# Patient Record
Sex: Female | Born: 1950
Health system: Southern US, Community
[De-identification: ages and names within clinical notes are randomized; demographics above are authoritative.]

## PROBLEM LIST (undated history)

## (undated) DIAGNOSIS — N390 Urinary tract infection, site not specified: Secondary | ICD-10-CM

## (undated) DIAGNOSIS — I10 Essential (primary) hypertension: Secondary | ICD-10-CM

## (undated) DIAGNOSIS — C50919 Malignant neoplasm of unspecified site of unspecified female breast: Secondary | ICD-10-CM

## (undated) DIAGNOSIS — E785 Hyperlipidemia, unspecified: Secondary | ICD-10-CM

## (undated) DIAGNOSIS — N189 Chronic kidney disease, unspecified: Secondary | ICD-10-CM

## (undated) DIAGNOSIS — E119 Type 2 diabetes mellitus without complications: Secondary | ICD-10-CM

## (undated) HISTORY — DX: Hyperlipidemia, unspecified: E78.5

---

## 2000-10-04 ENCOUNTER — Encounter: Payer: Self-pay | Admitting: General Surgery

## 2000-10-09 ENCOUNTER — Encounter (INDEPENDENT_AMBULATORY_CARE_PROVIDER_SITE_OTHER): Payer: Self-pay

## 2000-10-10 ENCOUNTER — Inpatient Hospital Stay (HOSPITAL_COMMUNITY): Admission: EM | Admit: 2000-10-10 | Discharge: 2000-10-12 | Payer: Self-pay | Admitting: General Surgery

## 2002-03-28 ENCOUNTER — Encounter: Admission: RE | Admit: 2002-03-28 | Discharge: 2002-06-26 | Payer: Self-pay | Admitting: Internal Medicine

## 2002-08-22 ENCOUNTER — Encounter: Admission: RE | Admit: 2002-08-22 | Discharge: 2002-08-22 | Payer: Self-pay | Admitting: Internal Medicine

## 2002-08-22 ENCOUNTER — Encounter: Payer: Self-pay | Admitting: Internal Medicine

## 2004-01-02 ENCOUNTER — Ambulatory Visit (HOSPITAL_COMMUNITY): Admission: RE | Admit: 2004-01-02 | Discharge: 2004-01-02 | Payer: Self-pay | Admitting: Gastroenterology

## 2006-03-21 ENCOUNTER — Encounter: Admission: RE | Admit: 2006-03-21 | Discharge: 2006-03-21 | Payer: Self-pay | Admitting: Internal Medicine

## 2009-07-25 ENCOUNTER — Emergency Department (HOSPITAL_COMMUNITY): Admission: EM | Admit: 2009-07-25 | Discharge: 2009-07-25 | Payer: Self-pay | Admitting: Emergency Medicine

## 2009-08-23 ENCOUNTER — Emergency Department (HOSPITAL_COMMUNITY): Admission: EM | Admit: 2009-08-23 | Discharge: 2009-08-23 | Payer: Self-pay | Admitting: Emergency Medicine

## 2009-09-16 ENCOUNTER — Inpatient Hospital Stay (HOSPITAL_COMMUNITY): Admission: EM | Admit: 2009-09-16 | Discharge: 2009-09-18 | Payer: Self-pay | Admitting: Emergency Medicine

## 2009-09-17 ENCOUNTER — Encounter (INDEPENDENT_AMBULATORY_CARE_PROVIDER_SITE_OTHER): Payer: Self-pay | Admitting: Internal Medicine

## 2010-12-19 ENCOUNTER — Encounter: Payer: Self-pay | Admitting: Internal Medicine

## 2011-03-03 LAB — URINALYSIS, MICROSCOPIC ONLY
Bilirubin Urine: NEGATIVE
Glucose, UA: NEGATIVE mg/dL
Hgb urine dipstick: NEGATIVE
Ketones, ur: NEGATIVE mg/dL
Nitrite: POSITIVE — AB
Protein, ur: NEGATIVE mg/dL
Specific Gravity, Urine: 1.019 (ref 1.005–1.030)
Urobilinogen, UA: 0.2 mg/dL (ref 0.0–1.0)
pH: 5.5 (ref 5.0–8.0)

## 2011-03-03 LAB — CBC
HCT: 37.9 % (ref 36.0–46.0)
HCT: 39.4 % (ref 36.0–46.0)
Hemoglobin: 12.9 g/dL (ref 12.0–15.0)
Hemoglobin: 13.1 g/dL (ref 12.0–15.0)
MCHC: 33.2 g/dL (ref 30.0–36.0)
MCHC: 34 g/dL (ref 30.0–36.0)
MCV: 89.1 fL (ref 78.0–100.0)
MCV: 89.7 fL (ref 78.0–100.0)
Platelets: 242 10*3/uL (ref 150–400)
Platelets: 257 10*3/uL (ref 150–400)
RBC: 4.26 MIL/uL (ref 3.87–5.11)
RBC: 4.39 MIL/uL (ref 3.87–5.11)
RDW: 14.3 % (ref 11.5–15.5)
RDW: 14.7 % (ref 11.5–15.5)
WBC: 5.9 10*3/uL (ref 4.0–10.5)
WBC: 6.4 10*3/uL (ref 4.0–10.5)

## 2011-03-03 LAB — GLUCOSE, CAPILLARY
Glucose-Capillary: 100 mg/dL — ABNORMAL HIGH (ref 70–99)
Glucose-Capillary: 100 mg/dL — ABNORMAL HIGH (ref 70–99)
Glucose-Capillary: 101 mg/dL — ABNORMAL HIGH (ref 70–99)
Glucose-Capillary: 101 mg/dL — ABNORMAL HIGH (ref 70–99)
Glucose-Capillary: 103 mg/dL — ABNORMAL HIGH (ref 70–99)
Glucose-Capillary: 115 mg/dL — ABNORMAL HIGH (ref 70–99)
Glucose-Capillary: 120 mg/dL — ABNORMAL HIGH (ref 70–99)
Glucose-Capillary: 85 mg/dL (ref 70–99)

## 2011-03-03 LAB — BASIC METABOLIC PANEL
BUN: 15 mg/dL (ref 6–23)
BUN: 15 mg/dL (ref 6–23)
BUN: 16 mg/dL (ref 6–23)
CO2: 26 mEq/L (ref 19–32)
CO2: 26 mEq/L (ref 19–32)
CO2: 27 mEq/L (ref 19–32)
Calcium: 9.2 mg/dL (ref 8.4–10.5)
Calcium: 9.4 mg/dL (ref 8.4–10.5)
Calcium: 9.6 mg/dL (ref 8.4–10.5)
Chloride: 103 mEq/L (ref 96–112)
Chloride: 104 mEq/L (ref 96–112)
Chloride: 104 mEq/L (ref 96–112)
Creatinine, Ser: 0.77 mg/dL (ref 0.4–1.2)
Creatinine, Ser: 0.86 mg/dL (ref 0.4–1.2)
Creatinine, Ser: 0.94 mg/dL (ref 0.4–1.2)
GFR calc Af Amer: >60 mL/min (ref >60)
GFR calc Af Amer: >60 mL/min (ref >60)
GFR calc Af Amer: >60 mL/min (ref >60)
GFR calc non Af Amer: >60 mL/min (ref >60)
GFR calc non Af Amer: >60 mL/min (ref >60)
GFR calc non Af Amer: >60 mL/min (ref >60)
Glucose, Bld: 103 mg/dL — ABNORMAL HIGH (ref 70–99)
Glucose, Bld: 106 mg/dL — ABNORMAL HIGH (ref 70–99)
Glucose, Bld: 116 mg/dL — ABNORMAL HIGH (ref 70–99)
Potassium: 3.3 mEq/L — ABNORMAL LOW (ref 3.5–5.1)
Potassium: 3.5 mEq/L (ref 3.5–5.1)
Potassium: 3.9 mEq/L (ref 3.5–5.1)
Sodium: 137 mEq/L (ref 135–145)
Sodium: 137 mEq/L (ref 135–145)
Sodium: 137 mEq/L (ref 135–145)

## 2011-03-03 LAB — CARDIAC PANEL(CRET KIN+CKTOT+MB+TROPI)
CK, MB: 2.3 ng/mL (ref 0.3–4.0)
CK, MB: 2.4 ng/mL (ref 0.3–4.0)
CK, MB: 3.3 ng/mL (ref 0.3–4.0)
Relative Index: 1.5 (ref 0.0–2.5)
Relative Index: 1.6 (ref 0.0–2.5)
Relative Index: 1.7 (ref 0.0–2.5)
Total CK: 137 U/L (ref 7–177)
Total CK: 154 U/L (ref 7–177)
Total CK: 215 U/L — ABNORMAL HIGH (ref 7–177)
Troponin I: 0.02 ng/mL (ref 0.00–0.06)
Troponin I: 0.03 ng/mL (ref 0.00–0.06)
Troponin I: 0.03 ng/mL (ref 0.00–0.06)

## 2011-03-03 LAB — APTT: aPTT: 25 seconds (ref 24–37)

## 2011-03-03 LAB — PROTIME-INR
INR: 0.86 (ref 0.00–1.49)
Prothrombin Time: 11.6 seconds (ref 11.6–15.2)

## 2011-03-03 LAB — HEMOGLOBIN A1C
Hgb A1c MFr Bld: 6.5 % — ABNORMAL HIGH (ref 4.6–6.1)
Mean Plasma Glucose: 140 mg/dL

## 2011-03-03 LAB — LIPID PANEL
Cholesterol: 218 mg/dL — ABNORMAL HIGH (ref 0–200)
HDL: 40 mg/dL (ref >39)
LDL Cholesterol: 147 mg/dL — ABNORMAL HIGH (ref 0–99)
Total CHOL/HDL Ratio: 5.5 RATIO
Triglycerides: 156 mg/dL — ABNORMAL HIGH (ref <150)
VLDL: 31 mg/dL (ref 0–40)

## 2011-03-03 LAB — DIFFERENTIAL
Basophils Absolute: 0.1 10*3/uL (ref 0.0–0.1)
Basophils Relative: 2 % — ABNORMAL HIGH (ref 0–1)
Eosinophils Absolute: 0.3 10*3/uL (ref 0.0–0.7)
Eosinophils Relative: 5 % (ref 0–5)
Lymphocytes Relative: 55 % — ABNORMAL HIGH (ref 12–46)
Lymphs Abs: 3.3 10*3/uL (ref 0.7–4.0)
Monocytes Absolute: 0.4 10*3/uL (ref 0.1–1.0)
Monocytes Relative: 6 % (ref 3–12)
Neutro Abs: 1.9 10*3/uL (ref 1.7–7.7)
Neutrophils Relative %: 32 % — ABNORMAL LOW (ref 43–77)

## 2011-03-03 LAB — TECHNOLOGIST SMEAR REVIEW

## 2011-03-05 LAB — GLUCOSE, CAPILLARY: Glucose-Capillary: 114 mg/dL — ABNORMAL HIGH (ref 70–99)

## 2011-04-15 NOTE — Discharge Summary (Signed)
[facility]  Patient:    Michele Wang, Michele Wang                         MRN: [id number] Adm. Date:  [id number] Disc. Date: [id number] Attending:  Barbera Setters CC:         Guinevere Ferrari, M.D. at [place]   Discharge Summary  FINAL DIAGNOSES: 1. Chronic cholecystitis with chololithiasis. 2. Postoperative hemorrhage from abdominal wall.  OPERATION PERFORMED:  Laparoscopic cholecystectomy.  DATE OF SURGERY:  October 09, 2000.  HISTORY OF PRESENT ILLNESS:  This is a 60 year old black female who had a one month history of recurrent episodes of right upper quadrant pain and nausea which is exacerbated by meals.  She did have a history of hepatitis in 1990. Gallbladder ultrasound at [facility] showed gallstones.  She was brought to the hospital electively for cholecystectomy.  PHYSICAL EXAMINATION:  GENERAL:  A healthy middle-aged black female in no distress.  HEENT:  Sclerae were clear.  NECK:  Supple, no mass.  LUNGS:  Clear to auscultation.  HEART:  Regular rate and rhythm, no murmur.  ABDOMEN:  Soft, nontender, no mass, no hernia.  EXTREMITIES:  No edema, good pulses.  NEUROLOGIC:  Grossly normal.  HOSPITAL COURSE:  On the day of admission the patient underwent laparoscopic cholecystectomy.  The surgery was uneventful.  Operative findings were consistent with chronic cholecystitis.  The evening of the operative day there was noted to be minor oozing from the epigastric incision, this did not appear to be a major problem.  In the morning of October 10, 2000, the patient was nauseated and had vomited, but was not having much pain.  Her blood pressure was noted to be 70/48, with a heart rate of 68, and her skin felt a little cool and clammy.  She had some more active bleeding from the epigastric and the umbilical trocar sites requiring several dressing changes.  The umbilical port site was sutured with nylon suture, and the  bleeding stopped.  Blood count was checked and hemoglobin was 9.8, having been 13.1 preoperatively, confirming that she had some bleeding.  She was observed very carefully for signs of ongoing bleeding. After getting fluid resuscitation her blood pressure stabilized.  Hemoglobin was checked in the evening of October 10, 2000, and it fell to 8.0. Dr. Hassell Done saw her and I gave her a transfusion of 2 units of packed red blood cells.  Hemoglobin in the morning of October 11, 2000, was up to 9.6.  She was feeling much better.  The nausea and vomiting had resolved.  She was ambulatory, and her abdomen was soft and benign.  She was continued to be observed.  In the morning of October 12, 2000, she was feeling much better, ambulating, tolerating a regular diet, had a bowel movement, and wanted to go home.  Her wounds looked fine, there was no bleeding.  Her hemoglobin was 9.2, and white count was 7500.  It was my impression that she probably had bleeding from the midline abdominal trocar sites with some of the bleeding presenting externally, but possibly having some intra-abdominal hemorrhage.  This was felt to be self-limited, and she was doing well.  She was discharged on October 12, 2000.  DISCHARGE MEDICATIONS:  She was given a prescription for Vicodin for pain and iron tablets.  FOLLOWUP:  She was asked to return to see me in the office in one week. DD:  11/06/00 TD:  11/06/00 Job: [id number]

## 2011-04-15 NOTE — Op Note (Signed)
NAME:  Michele Wang, Michele Wang                            ACCOUNT NO.:  [id number]   MEDICAL RECORD NO.:  [id number]                   PATIENT TYPE:  AMB   LOCATION:  ENDO                                 FACILITY:  [facility]   PHYSICIAN:  Nelwyn Salisbury, M.D.               DATE OF BIRTH:  [birth year]   DATE OF PROCEDURE:  01/02/2004  DATE OF DISCHARGE:                                 OPERATIVE REPORT   PROCEDURE:  Screening colonoscopy.   ENDOSCOPIST:  Nelwyn Salisbury, M.D.   INSTRUMENT USED:  Olympus video colonoscope.   INDICATIONS FOR PROCEDURE:  Rectal bleeding in a 60 year old African  American female, rule out colonic polyps, masses, etc.   PREPROCEDURE PREPARATION:  Informed consent was obtained from the patient  and the patient was  fasted for 8 hours prior to  the procedure and prepped  with a bottle of magnesium citrate and a gallon of GoLYTELY the  night prior  to the procedure.   PREPROCEDURE PHYSICAL:  The patient had stable vital signs. Neck supple.  Chest clear to auscultation, S1, S2 regular. Abdomen soft with normoactive  bowel sounds.   DESCRIPTION OF PROCEDURE:  The patient was placed in the left lateral  decubitus position and sedated with 80 mg of Demerol and 8 mg of Versed  intravenously. Once sedation was adequate, the patient was maintained on low  flow oxygen and continuous cardiac monitoring.   The Olympus video colonoscope was advanced into the rectum to the cecum.  Except for internal hemorrhoids, no other  abnormalities were noted. The  appendiceal orifice and ileocecal valve were clearly visualized and  photographed. No masses, polyps, erosions, ulcerations or diverticula were  present.   IMPRESSION:  Normal colonoscopy up to the cecum except for small  internal  hemorrhoids.   RECOMMENDATIONS:  1. Continue the high fiber diet with liberal fluid intake.  2. Repeat  colorectal cancer  screening in the next 10 years unless the     patient develops any  abnormal symptoms in the interim.  3. Outpatient follow up in 2 weeks for further recommendations.                                               Nelwyn Salisbury, M.D.    JNM/MEDQ  D:  01/02/2004  T:  01/03/2004  Job:  [id number]   cc:   Theda Belfast. Baird Cancer, M.D.  [address]  [address] [place] [address]  Fax: [phone]

## 2011-04-15 NOTE — Op Note (Signed)
[facility]  Patient:    Michele Wang, Michele Wang                         MRN: [id number] Proc. Date: 10/09/00 Adm. Date:  [id number] Attending:  Barbera Setters CC:         Guinevere Ferrari, M.D., [facility]   Operative Report  PREOPERATIVE DIAGNOSES:  Chronic cholecystitis with cholelithiasis.  POSTOPERATIVE DIAGNOSES:  Chronic cholecystitis with cholelithiasis.  OPERATION PERFORMED:  Laparoscopic cholecystectomy.  SURGEON:  Edsel Petrin. Dalbert Batman, M.D.  FIRST ASSISTANT:  Dr. Coralie Keens.  OPERATIVE INDICATION:  This is a 60 year old black female with a one month history of almost daily episodes of epigastric and right upper quadrant pain and nausea.  The pain tends to be exacerbated by meals and will last about two hours and then resolve.  She has had a lot of bloating.  Gallbladder ultrasound at [facility] showed several gallstones.  Gallbladder wall was not thickened.  Common bile duct is normal.  Liver function tests are normal.  She is brought to the operating room electively for cholecystectomy.  OPERATIVE FINDINGS:  The gallbladder was chronically inflamed and discolored. The anatomy of the cystic duct, cystic artery, and common bile duct were conventional.  The liver appeared healthy.  The stomach and duodenum, small intestines and large intestines, and peritoneal surfaces were normal to inspection.  OPERATIVE TECHNIQUE:  Following the induction of general endotracheal anesthesia, the patients abdomen was prepped and draped in a sterile fashion. Then 0.5% Marcaine with epinephrine was used as a local infiltration anesthetic.  A vertical incision was made inside the lower rim of the umbilicus.  The fascia was incised in the midline and the abdominal cavity entered under direct vision.  A 10 mm Hasson trocar was inserted and secured to the purse-string suture with 0 Vicryl.  A pneumoperitoneum was created.  A video cam was inserted  with visualization and findings as described above.  A 10 mm trocar was placed in the subxiphoid region and two 5 mm trocars placed in the right and mid abdomen.  The gallbladder was identified and elevated, placed on traction.  Adhesions were taken down.  We carefully dissected out the cystic duct and cystic artery until we had a large window behind these structures.  We clearly identified the junction of the cystic duct with the gallbladder occluded in the fibrotic region of the common bile duct.  The cystic duct and cystic artery were then secured with metal clips and divided. The gallbladder was dissected from its bed with electrocautery.  That dissection was uneventful.  The gallbladder was removed through the umbilical port.  The operative field was copiously irrigated with saline.  There was no bleeding and no bile leak whatsoever.  The trocars were removed under direct vision, and there was no bleeding internally from the trocar sites.  The pneumoperitoneum was released.  The fascia of the umbilicus was closed with 0 Vicryl suture.  The skin incisions in the midline were closed with subcuticular sutures of 4-0 Vicryl and Steri-Strips.  The lateral 5 mm trocar sites had some bleeding from the skin and were closed with mattress sutures of 4-0 nylon.  Clean bandages were placed, and the patient taken to the recovery room in stable condition.  Estimated blood loss was about 10 cc. Complications were none.  Sponge, needle and instrument counts were correct. D:  10/09/00 TD:  10/09/00 Job: [id number]

## 2016-12-21 ENCOUNTER — Emergency Department (HOSPITAL_COMMUNITY)
Admission: EM | Admit: 2016-12-21 | Discharge: 2016-12-22 | Disposition: A | Discharge status: Home / Self Care | Payer: BLUE CROSS/BLUE SHIELD | Attending: Emergency Medicine | Admitting: Emergency Medicine

## 2016-12-21 ENCOUNTER — Encounter (HOSPITAL_COMMUNITY): Payer: Self-pay | Admitting: Emergency Medicine

## 2016-12-21 DIAGNOSIS — I10 Essential (primary) hypertension: Secondary | ICD-10-CM | POA: Insufficient documentation

## 2016-12-21 DIAGNOSIS — R319 Hematuria, unspecified: Secondary | ICD-10-CM | POA: Diagnosis not present

## 2016-12-21 DIAGNOSIS — N390 Urinary tract infection, site not specified: Secondary | ICD-10-CM

## 2016-12-21 DIAGNOSIS — E119 Type 2 diabetes mellitus without complications: Secondary | ICD-10-CM | POA: Insufficient documentation

## 2016-12-21 DIAGNOSIS — R1030 Lower abdominal pain, unspecified: Secondary | ICD-10-CM | POA: Diagnosis present

## 2016-12-21 HISTORY — DX: Essential (primary) hypertension: I10

## 2016-12-21 HISTORY — DX: Urinary tract infection, site not specified: N39.0

## 2016-12-21 HISTORY — DX: Type 2 diabetes mellitus without complications: E11.9

## 2016-12-21 LAB — COMPREHENSIVE METABOLIC PANEL
ALT: 24 U/L (ref 14–54)
AST: 23 U/L (ref 15–41)
Albumin: 4.5 g/dL (ref 3.5–5.0)
Alkaline Phosphatase: 77 U/L (ref 38–126)
Anion gap: 9 (ref 5–15)
BUN: 21 mg/dL — ABNORMAL HIGH (ref 6–20)
CO2: 27 mmol/L (ref 22–32)
Calcium: 9.3 mg/dL (ref 8.9–10.3)
Chloride: 103 mmol/L (ref 101–111)
Creatinine, Ser: 1.27 mg/dL — ABNORMAL HIGH (ref 0.44–1.00)
GFR calc Af Amer: 50 mL/min — ABNORMAL LOW (ref >60)
GFR calc non Af Amer: 43 mL/min — ABNORMAL LOW (ref >60)
Glucose, Bld: 159 mg/dL — ABNORMAL HIGH (ref 65–99)
Potassium: 3 mmol/L — ABNORMAL LOW (ref 3.5–5.1)
Sodium: 139 mmol/L (ref 135–145)
Total Bilirubin: 0.7 mg/dL (ref 0.3–1.2)
Total Protein: 7.9 g/dL (ref 6.5–8.1)

## 2016-12-21 LAB — LIPASE, BLOOD: Lipase: 166 U/L — ABNORMAL HIGH (ref 11–51)

## 2016-12-21 LAB — URINALYSIS, ROUTINE W REFLEX MICROSCOPIC
Bilirubin Urine: NEGATIVE
Glucose, UA: NEGATIVE mg/dL
Ketones, ur: NEGATIVE mg/dL
Nitrite: NEGATIVE
Protein, ur: 100 mg/dL — AB
Specific Gravity, Urine: 1.014 (ref 1.005–1.030)
pH: 7 (ref 5.0–8.0)

## 2016-12-21 LAB — CBC
HCT: 40.4 % (ref 36.0–46.0)
Hemoglobin: 13.3 g/dL (ref 12.0–15.0)
MCH: 29 pg (ref 26.0–34.0)
MCHC: 32.9 g/dL (ref 30.0–36.0)
MCV: 88.2 fL (ref 78.0–100.0)
Platelets: 238 10*3/uL (ref 150–400)
RBC: 4.58 MIL/uL (ref 3.87–5.11)
RDW: 15 % (ref 11.5–15.5)
WBC: 9.9 10*3/uL (ref 4.0–10.5)

## 2016-12-21 MED ORDER — IRBESARTAN 300 MG PO TABS
300.0000 mg | ORAL_TABLET | Freq: Every day | ORAL | Status: DC
  Administered 2016-12-21: 300 mg via ORAL
  Filled 2016-12-21: qty 1

## 2016-12-21 MED ORDER — SODIUM CHLORIDE 0.9 % IV BOLUS (SEPSIS)
1000.0000 mL | Freq: Once | INTRAVENOUS | Status: AC
  Administered 2016-12-21: 1000 mL via INTRAVENOUS

## 2016-12-21 MED ORDER — OXYCODONE-ACETAMINOPHEN 5-325 MG PO TABS
1.0000 | ORAL_TABLET | ORAL | Status: DC | PRN
  Administered 2016-12-21: 1 via ORAL
  Filled 2016-12-21: qty 1

## 2016-12-21 MED ORDER — AMLODIPINE BESYLATE 5 MG PO TABS
5.0000 mg | ORAL_TABLET | Freq: Once | ORAL | Status: AC
  Administered 2016-12-21: 5 mg via ORAL
  Filled 2016-12-21: qty 1

## 2016-12-21 MED ORDER — SODIUM CHLORIDE 0.9 % IV BOLUS (SEPSIS)
1000.0000 mL | Freq: Once | INTRAVENOUS | Status: AC
  Administered 2016-12-22: 1000 mL via INTRAVENOUS

## 2016-12-21 MED ORDER — ONDANSETRON 4 MG PO TBDP
4.0000 mg | ORAL_TABLET | Freq: Once | ORAL | Status: AC | PRN
  Administered 2016-12-21: 4 mg via ORAL
  Filled 2016-12-21: qty 1

## 2016-12-21 MED ORDER — DEXTROSE 5 % IV SOLN
1.0000 g | Freq: Once | INTRAVENOUS | Status: AC
  Administered 2016-12-22: 1 g via INTRAVENOUS
  Filled 2016-12-21: qty 10

## 2016-12-21 MED ORDER — HYDROCHLOROTHIAZIDE 12.5 MG PO CAPS
25.0000 mg | ORAL_CAPSULE | Freq: Once | ORAL | Status: AC
  Administered 2016-12-21: 25 mg via ORAL
  Filled 2016-12-21: qty 2

## 2016-12-21 MED ORDER — KETOROLAC TROMETHAMINE 30 MG/ML IJ SOLN
30.0000 mg | Freq: Once | INTRAMUSCULAR | Status: AC
  Administered 2016-12-21: 30 mg via INTRAVENOUS
  Filled 2016-12-21: qty 1

## 2016-12-21 NOTE — ED Notes (Signed)
Staff notified that she was vomiting in the lobby. Provided patient Zofran and no active vomiting.

## 2016-12-21 NOTE — ED Triage Notes (Signed)
Pt comes with complaints of lower generalized abdominal pain that began yesterday and has gotten worse today.  Pt has had one emesis episode today.  Endorses a couple of instances of diarrhea.  Denies any medication use for symptoms.

## 2016-12-22 MED ORDER — ONDANSETRON 4 MG PO TBDP: 4.0000 mg | ORAL_TABLET | Freq: Three times a day (TID) | ORAL | 0 refills | Status: DC | PRN

## 2016-12-22 MED ORDER — CEPHALEXIN 500 MG PO CAPS: 500.0000 mg | ORAL_CAPSULE | Freq: Three times a day (TID) | ORAL | 0 refills | Status: AC

## 2016-12-22 NOTE — ED Provider Notes (Signed)
[place] DEPT Provider Note   CSN: [id number] Arrival date & time: 12/21/16  1911     History   Chief Complaint No chief complaint on file.   HPI Michele Wang is a 66 y.o. female.  The history is provided by the patient.  Abdominal Pain   This is a new problem. The current episode started 2 days ago. The problem occurs constantly. The problem has not changed since onset.The pain is associated with an unknown factor. The pain is located in the suprapubic region (and lower back). The quality of the pain is aching. The pain is moderate. Associated symptoms include dysuria and frequency. Nothing aggravates the symptoms. Nothing relieves the symptoms.    Past Medical History:  Diagnosis Date  . Diabetes mellitus without complication ([place])    Type II  . Hypertension   . UTI (urinary tract infection)     There are no active problems to display for this patient.   Past Surgical History:  Procedure Laterality Date  . CESAREAN SECTION     x2    OB History    No data available       Home Medications    Prior to Admission medications   Medication Sig Start Date End Date Taking? Authorizing Provider  Olmesartan-Amlodipine-HCTZ (TRIBENZOR) 40-5-25 MG TABS Take 1 tablet by mouth daily.   Yes Historical Provider, MD  sitaGLIPtin-metformin (JANUMET) 50-500 MG tablet Take 1 tablet by mouth 2 (two) times daily with a meal.   Yes Historical Provider, MD    Family History No family history on file.  Social History Social History  Substance Use Topics  . Smoking status: Never Smoker  . Smokeless tobacco: Never Used  . Alcohol use No     Allergies   Patient has no known allergies.   Review of Systems Review of Systems  Gastrointestinal: Positive for abdominal pain.  Genitourinary: Positive for dysuria and frequency.  All other systems reviewed and are negative.    Physical Exam Updated Vital Signs BP (!) 252/133 (BP Location: Left Arm)   Pulse 84   Temp  97.7 F (36.5 C) (Oral)   Resp 18   Ht 5\' 6"  (1.676 m)   Wt 175 lb (79.4 kg)   SpO2 100%   BMI 28.25 kg/m   Physical Exam  Constitutional: She is oriented to person, place, and time. She appears well-developed and well-nourished. No distress.  HENT:  Head: Normocephalic.  Nose: Nose normal.  Eyes: Conjunctivae are normal.  Neck: Neck supple. No tracheal deviation present.  Cardiovascular: Normal rate, regular rhythm and normal heart sounds.   Pulmonary/Chest: Effort normal and breath sounds normal. No respiratory distress.  Abdominal: Soft. She exhibits no distension. There is tenderness in the suprapubic area. There is no guarding, no CVA tenderness, no tenderness at McBurney's point and negative Murphy's sign.  Neurological: She is alert and oriented to person, place, and time.  Skin: Skin is warm and dry.  Psychiatric: She has a normal mood and affect.  Vitals reviewed.    ED Treatments / Results  Labs (all labs ordered are listed, but only abnormal results are displayed) Labs Reviewed  LIPASE, BLOOD - Abnormal; Notable for the following:       Result Value   Lipase 166 (*)    All other components within normal limits  COMPREHENSIVE METABOLIC PANEL - Abnormal; Notable for the following:    Potassium 3.0 (*)    Glucose, Bld 159 (*)    BUN 21 (*)  Creatinine, Ser 1.27 (*)    GFR calc non Af Amer 43 (*)    GFR calc Af Amer 50 (*)    All other components within normal limits  URINALYSIS, ROUTINE W REFLEX MICROSCOPIC - Abnormal; Notable for the following:    Color, Urine STRAW (*)    APPearance HAZY (*)    Hgb urine dipstick MODERATE (*)    Protein, ur 100 (*)    Leukocytes, UA MODERATE (*)    Bacteria, UA FEW (*)    Squamous Epithelial / LPF 6-30 (*)    All other components within normal limits  URINE CULTURE  CBC    EKG  EKG Interpretation None       Radiology No results found.  Procedures Procedures (including critical care time)  Medications  Ordered in ED Medications  irbesartan (AVAPRO) tablet 300 mg (300 mg Oral Given 12/21/16 2310)  ondansetron (ZOFRAN-ODT) disintegrating tablet 4 mg (4 mg Oral Given 12/21/16 2104)  sodium chloride 0.9 % bolus 1,000 mL (0 mLs Intravenous Stopped 12/22/16 0012)  amLODipine (NORVASC) tablet 5 mg (5 mg Oral Given 12/21/16 2310)  hydrochlorothiazide (MICROZIDE) capsule 25 mg (25 mg Oral Given 12/21/16 2310)  ketorolac (TORADOL) 30 MG/ML injection 30 mg (30 mg Intravenous Given 12/21/16 2308)  sodium chloride 0.9 % bolus 1,000 mL (0 mLs Intravenous Stopped 12/22/16 0112)  cefTRIAXone (ROCEPHIN) 1 g in dextrose 5 % 50 mL IVPB (0 g Intravenous Stopped 12/22/16 0053)     Initial Impression / Assessment and Plan / ED Course  I have reviewed the triage vital signs and the nursing notes.  Pertinent labs & imaging results that were available during my care of the patient were reviewed by me and considered in my medical decision making (see chart for details).     66 y.o. female presents with emesis and lower abdominal pain. Vomited once today. Has not taken BP meds today d/t vomiting. Provided home dose equivalent here after antiemetics. Appears to have UTI, treated with IV rocephin. Will treat empirically with keflex pending culture. Tolerating po. Increased lipase but typical UTI symptoms and lower abdominal pain so doubt pancreatitis. Has increase in Cr from baseline, given IVF and recommended recheck in a few days as OP. Plan to follow up with PCP as needed and return precautions discussed for worsening or new concerning symptoms.   Final Clinical Impressions(s) / ED Diagnoses   Final diagnoses:  Urinary tract infection with hematuria, site unspecified    New Prescriptions New Prescriptions   No medications on file     Leo Grosser, MD 12/22/16 [id number]

## 2016-12-22 NOTE — ED Notes (Signed)
Patient was alert, oriented and stable upon discharge. RN went over AVS and patient had no further questions.  

## 2016-12-23 ENCOUNTER — Emergency Department (HOSPITAL_COMMUNITY)
Admission: EM | Admit: 2016-12-23 | Discharge: 2016-12-23 | Discharge status: Against Medical Advice | Payer: Medicare Other

## 2016-12-23 LAB — URINE CULTURE

## 2016-12-24 ENCOUNTER — Encounter (HOSPITAL_BASED_OUTPATIENT_CLINIC_OR_DEPARTMENT_OTHER): Payer: Self-pay

## 2016-12-24 DIAGNOSIS — I10 Essential (primary) hypertension: Secondary | ICD-10-CM | POA: Diagnosis not present

## 2016-12-24 DIAGNOSIS — N132 Hydronephrosis with renal and ureteral calculous obstruction: Secondary | ICD-10-CM | POA: Diagnosis not present

## 2016-12-24 DIAGNOSIS — E119 Type 2 diabetes mellitus without complications: Secondary | ICD-10-CM | POA: Insufficient documentation

## 2016-12-24 DIAGNOSIS — R1012 Left upper quadrant pain: Secondary | ICD-10-CM | POA: Diagnosis present

## 2016-12-24 DIAGNOSIS — E876 Hypokalemia: Secondary | ICD-10-CM | POA: Diagnosis not present

## 2016-12-24 DIAGNOSIS — N201 Calculus of ureter: Secondary | ICD-10-CM | POA: Diagnosis not present

## 2016-12-24 DIAGNOSIS — Z7984 Long term (current) use of oral hypoglycemic drugs: Secondary | ICD-10-CM | POA: Diagnosis not present

## 2016-12-24 LAB — CBC WITH DIFFERENTIAL/PLATELET
Basophils Absolute: 0 10*3/uL (ref 0.0–0.1)
Basophils Relative: 0 %
Eosinophils Absolute: 0.1 10*3/uL (ref 0.0–0.7)
Eosinophils Relative: 1 %
HCT: 43.3 % (ref 36.0–46.0)
Hemoglobin: 14.3 g/dL (ref 12.0–15.0)
Lymphocytes Relative: 36 %
Lymphs Abs: 3.1 10*3/uL (ref 0.7–4.0)
MCH: 29.1 pg (ref 26.0–34.0)
MCHC: 33 g/dL (ref 30.0–36.0)
MCV: 88 fL (ref 78.0–100.0)
Monocytes Absolute: 0.5 10*3/uL (ref 0.1–1.0)
Monocytes Relative: 6 %
Neutro Abs: 4.8 10*3/uL (ref 1.7–7.7)
Neutrophils Relative %: 57 %
Platelets: 235 10*3/uL (ref 150–400)
RBC: 4.92 MIL/uL (ref 3.87–5.11)
RDW: 14.8 % (ref 11.5–15.5)
WBC: 8.6 10*3/uL (ref 4.0–10.5)

## 2016-12-24 LAB — COMPREHENSIVE METABOLIC PANEL
ALT: 27 U/L (ref 14–54)
AST: 25 U/L (ref 15–41)
Albumin: 4.6 g/dL (ref 3.5–5.0)
Alkaline Phosphatase: 77 U/L (ref 38–126)
Anion gap: 11 (ref 5–15)
BUN: 21 mg/dL — ABNORMAL HIGH (ref 6–20)
CO2: 29 mmol/L (ref 22–32)
Calcium: 10.1 mg/dL (ref 8.9–10.3)
Chloride: 96 mmol/L — ABNORMAL LOW (ref 101–111)
Creatinine, Ser: 1.32 mg/dL — ABNORMAL HIGH (ref 0.44–1.00)
GFR calc Af Amer: 48 mL/min — ABNORMAL LOW (ref >60)
GFR calc non Af Amer: 41 mL/min — ABNORMAL LOW (ref >60)
Glucose, Bld: 158 mg/dL — ABNORMAL HIGH (ref 65–99)
Potassium: 2.9 mmol/L — ABNORMAL LOW (ref 3.5–5.1)
Sodium: 136 mmol/L (ref 135–145)
Total Bilirubin: 0.6 mg/dL (ref 0.3–1.2)
Total Protein: 8.6 g/dL — ABNORMAL HIGH (ref 6.5–8.1)

## 2016-12-24 NOTE — ED Triage Notes (Signed)
Pt reports lower abdominal pain that hasn't gotten better since pain started in beginning of week. Pt sts she has been taking her abx with no relief.

## 2016-12-25 ENCOUNTER — Emergency Department (HOSPITAL_BASED_OUTPATIENT_CLINIC_OR_DEPARTMENT_OTHER): Payer: BLUE CROSS/BLUE SHIELD

## 2016-12-25 ENCOUNTER — Emergency Department (HOSPITAL_BASED_OUTPATIENT_CLINIC_OR_DEPARTMENT_OTHER)
Admission: EM | Admit: 2016-12-25 | Discharge: 2016-12-25 | Disposition: A | Discharge status: Home / Self Care | Payer: BLUE CROSS/BLUE SHIELD | Attending: Emergency Medicine | Admitting: Emergency Medicine

## 2016-12-25 DIAGNOSIS — N201 Calculus of ureter: Secondary | ICD-10-CM

## 2016-12-25 DIAGNOSIS — E876 Hypokalemia: Secondary | ICD-10-CM

## 2016-12-25 LAB — URINALYSIS, ROUTINE W REFLEX MICROSCOPIC
Bilirubin Urine: NEGATIVE
Glucose, UA: NEGATIVE mg/dL
Ketones, ur: NEGATIVE mg/dL
Nitrite: NEGATIVE
Protein, ur: 30 mg/dL — AB
Specific Gravity, Urine: 1.016 (ref 1.005–1.030)
pH: 5.5 (ref 5.0–8.0)

## 2016-12-25 LAB — URINALYSIS, MICROSCOPIC (REFLEX)

## 2016-12-25 LAB — LIPASE, BLOOD: Lipase: 22 U/L (ref 11–51)

## 2016-12-25 MED ORDER — FENTANYL CITRATE (PF) 100 MCG/2ML IJ SOLN
100.0000 ug | Freq: Once | INTRAMUSCULAR | Status: AC
  Administered 2016-12-25: 100 ug via INTRAVENOUS
  Filled 2016-12-25: qty 2

## 2016-12-25 MED ORDER — SODIUM CHLORIDE 0.9 % IV SOLN
Freq: Once | INTRAVENOUS | Status: AC
  Administered 2016-12-25: 04:00:00 via INTRAVENOUS

## 2016-12-25 MED ORDER — TAMSULOSIN HCL 0.4 MG PO CAPS
0.4000 mg | ORAL_CAPSULE | Freq: Once | ORAL | Status: AC
  Administered 2016-12-25: 0.4 mg via ORAL
  Filled 2016-12-25: qty 1

## 2016-12-25 MED ORDER — IOPAMIDOL (ISOVUE-300) INJECTION 61%
100.0000 mL | Freq: Once | INTRAVENOUS | Status: AC | PRN
  Administered 2016-12-25: 100 mL via INTRAVENOUS

## 2016-12-25 MED ORDER — POTASSIUM CHLORIDE 20 MEQ/15ML (10%) PO SOLN
20.0000 meq | Freq: Once | ORAL | Status: AC
  Administered 2016-12-25: 20 meq via ORAL
  Filled 2016-12-25: qty 15

## 2016-12-25 MED ORDER — POTASSIUM CHLORIDE CRYS ER 20 MEQ PO TBCR: 20.0000 meq | EXTENDED_RELEASE_TABLET | Freq: Two times a day (BID) | ORAL | 0 refills | Status: DC

## 2016-12-25 MED ORDER — OXYCODONE-ACETAMINOPHEN 5-325 MG PO TABS: 1.0000 | ORAL_TABLET | Freq: Four times a day (QID) | ORAL | 0 refills | Status: DC | PRN

## 2016-12-25 MED ORDER — POTASSIUM CHLORIDE CRYS ER 20 MEQ PO TBCR
40.0000 meq | EXTENDED_RELEASE_TABLET | Freq: Once | ORAL | Status: AC
  Administered 2016-12-25: 40 meq via ORAL
  Filled 2016-12-25: qty 2

## 2016-12-25 MED ORDER — ONDANSETRON HCL 4 MG/2ML IJ SOLN
4.0000 mg | Freq: Once | INTRAMUSCULAR | Status: AC
  Administered 2016-12-25: 4 mg via INTRAVENOUS
  Filled 2016-12-25: qty 2

## 2016-12-25 MED ORDER — TAMSULOSIN HCL 0.4 MG PO CAPS: ORAL_CAPSULE | ORAL | 0 refills | Status: DC

## 2016-12-25 NOTE — ED Provider Notes (Signed)
[place] DEPT [facility] Provider Note: Georgena Spurling, MD, FACEP  CSN: [id number] MRN: [id number] ARRIVAL: 12/24/16 at 2310 ROOM: [place]  Abdominal Pain   HISTORY OF PRESENT ILLNESS  Michele Wang is a 66 y.o. female with four-day history of abdominal pain. She localizes the abdominal pain to the left upper quadrant radiating to the left flank. The pain is dull and she rates it as a 10 out of 10. It is not particularly exacerbated by movement. She had 2 episodes of vomiting the first day but none since. She is not nauseated now. She denies diarrhea, constipation, fever or dysuria.   She was seen in the ED on the 24th and diagnosed with urinary tract infection and treated with Keflex and Zofran. The note from the 24th indicate she was localizing the pain to the suprapubic region and lower back, that the pain was aching and moderate,  and she was having dysuria and urinary frequency. Note that this differs from the history she relates today. Urine culture grew out multiple species.   Past Medical History:  Diagnosis Date  . Diabetes mellitus without complication ([place])    Type II  . Hypertension   . UTI (urinary tract infection)     Past Surgical History:  Procedure Laterality Date  . CESAREAN SECTION     x2    No family history on file.  Social History  Substance Use Topics  . Smoking status: Never Smoker  . Smokeless tobacco: Never Used  . Alcohol use No    Prior to Admission medications   Medication Sig Start Date End Date Taking? Authorizing Provider  cephALEXin (KEFLEX) 500 MG capsule Take 1 capsule (500 mg total) by mouth 3 (three) times daily. 12/22/16 01/01/17  Leo Grosser, MD  Olmesartan-Amlodipine-HCTZ [facility]) 40-5-25 MG TABS Take 1 tablet by mouth daily.    Historical Provider, MD  ondansetron (ZOFRAN ODT) 4 MG disintegrating tablet Take 1 tablet (4 mg total) by mouth every 8 (eight) hours as needed for nausea or vomiting. 12/22/16   Leo Grosser, MD  sitaGLIPtin-metformin (JANUMET) 50-500 MG tablet Take 1 tablet by mouth 2 (two) times daily with a meal.    Historical Provider, MD    Allergies Patient has no known allergies.   REVIEW OF SYSTEMS  Negative except as noted here or in the History of Present Illness.   PHYSICAL EXAMINATION  Initial Vital Signs Blood pressure (!) 199/115, pulse 87, temperature 98.4 F (36.9 C), temperature source Oral, resp. rate 18, SpO2 100 %.  Examination General: Well-developed, well-nourished female in no acute distress; appearance consistent with age of record HENT: normocephalic; atraumatic Eyes: pupils equal, round and reactive to light; extraocular muscles intact Neck: supple Heart: regular rate and rhythm Lungs: clear to auscultation bilaterally Abdomen: soft; nondistended; mild left upper quadrant tenderness; no masses or hepatosplenomegaly; bowel sounds present GU: Mild left CVA tenderness Extremities: No deformity; full range of motion; pulses normal Neurologic: Awake, alert and oriented; motor function intact in all extremities and symmetric; no facial droop Skin: Warm and dry Psychiatric: Normal mood and affect   RESULTS  Summary of this visit's results, reviewed by myself:   EKG Interpretation  Date/Time:    Ventricular Rate:    PR Interval:    QRS Duration:   QT Interval:    QTC Calculation:   R Axis:     Text Interpretation:        Laboratory Studies: Results for orders placed or performed  during the hospital encounter of 12/25/16 (from the past 24 hour(s))  CBC with Differential     Status: None   Collection Time: 12/24/16 11:20 PM  Result Value Ref Range   WBC 8.6 4.0 - 10.5 K/uL   RBC 4.92 3.87 - 5.11 MIL/uL   Hemoglobin 14.3 12.0 - 15.0 g/dL   HCT 43.3 36.0 - 46.0 %   MCV 88.0 78.0 - 100.0 fL   MCH 29.1 26.0 - 34.0 pg   MCHC 33.0 30.0 - 36.0 g/dL   RDW 14.8 11.5 - 15.5 %   Platelets 235 150 - 400 K/uL   Neutrophils Relative % 57 %   Neutro  Abs 4.8 1.7 - 7.7 K/uL   Lymphocytes Relative 36 %   Lymphs Abs 3.1 0.7 - 4.0 K/uL   Monocytes Relative 6 %   Monocytes Absolute 0.5 0.1 - 1.0 K/uL   Eosinophils Relative 1 %   Eosinophils Absolute 0.1 0.0 - 0.7 K/uL   Basophils Relative 0 %   Basophils Absolute 0.0 0.0 - 0.1 K/uL  Comprehensive metabolic panel     Status: Abnormal   Collection Time: 12/24/16 11:20 PM  Result Value Ref Range   Sodium 136 135 - 145 mmol/L   Potassium 2.9 (L) 3.5 - 5.1 mmol/L   Chloride 96 (L) 101 - 111 mmol/L   CO2 29 22 - 32 mmol/L   Glucose, Bld 158 (H) 65 - 99 mg/dL   BUN 21 (H) 6 - 20 mg/dL   Creatinine, Ser 1.32 (H) 0.44 - 1.00 mg/dL   Calcium 10.1 8.9 - 10.3 mg/dL   Total Protein 8.6 (H) 6.5 - 8.1 g/dL   Albumin 4.6 3.5 - 5.0 g/dL   AST 25 15 - 41 U/L   ALT 27 14 - 54 U/L   Alkaline Phosphatase 77 38 - 126 U/L   Total Bilirubin 0.6 0.3 - 1.2 mg/dL   GFR calc non Af Amer 41 (L) >60 mL/min   GFR calc Af Amer 48 (L) >60 mL/min   Anion gap 11 5 - 15  Lipase, blood     Status: None   Collection Time: 12/25/16  2:50 AM  Result Value Ref Range   Lipase 22 11 - 51 U/L  Urinalysis, Routine w reflex microscopic     Status: Abnormal   Collection Time: 12/25/16  4:18 AM  Result Value Ref Range   Color, Urine YELLOW YELLOW   APPearance CLEAR CLEAR   Specific Gravity, Urine 1.016 1.005 - 1.030   pH 5.5 5.0 - 8.0   Glucose, UA NEGATIVE NEGATIVE mg/dL   Hgb urine dipstick LARGE (A) NEGATIVE   Bilirubin Urine NEGATIVE NEGATIVE   Ketones, ur NEGATIVE NEGATIVE mg/dL   Protein, ur 30 (A) NEGATIVE mg/dL   Nitrite NEGATIVE NEGATIVE   Leukocytes, UA SMALL (A) NEGATIVE  Urinalysis, Microscopic (reflex)     Status: Abnormal   Collection Time: 12/25/16  4:18 AM  Result Value Ref Range   RBC / HPF 6-30 0 - 5 RBC/hpf   WBC, UA 0-5 0 - 5 WBC/hpf   Bacteria, UA FEW (A) NONE SEEN   Squamous Epithelial / LPF 0-5 (A) NONE SEEN   Mucous PRESENT    Budding Yeast PRESENT    Hyaline Casts, UA PRESENT     Imaging Studies: Ct Abdomen Pelvis W Contrast  Result Date: 12/25/2016 CLINICAL DATA:  Initial evaluation for acute lower abdominal pain for 2 days. EXAM: CT ABDOMEN AND PELVIS WITH CONTRAST TECHNIQUE: Multidetector  CT imaging of the abdomen and pelvis was performed using the standard protocol following bolus administration of intravenous contrast. CONTRAST:  133mL ISOVUE-300 IOPAMIDOL (ISOVUE-300) INJECTION 61% COMPARISON:  None. FINDINGS: Lower chest: Deep tendon atelectasis present within the visualized lung bases. Visualized lungs are otherwise clear. Hepatobiliary: The liver within normal limits. Gallbladder surgically absent. No biliary dilatation. Pancreas: Pancreas within normal limits. Spleen: Spleen within normal limits. Adrenals/Urinary Tract: Adrenal glands are normal. U small scattered cysts noted within the right kidney. Right kidney otherwise unremarkable without evidence for nephrolithiasis, hydronephrosis, or focal enhancing renal mass. On the left, there is an obstructive 7 mm calcification at the left UVJ with secondary moderate left hydroureteronephrosis. Nephrogram is slightly delayed on the left. No other left renal calculi identified. No other stones seen within the dilated left ureter. Superimposed small left renal cysts noted. Bladder within normal limits. Stomach/Bowel: Enteric contrast material present within knee esophageal lumen. Stomach within normal limits. No evidence for bowel obstruction. Appendix normal. No acute inflammatory changes seen about the bowels. Vascular/Lymphatic: Normal intravascular enhancement seen throughout the intra-abdominal aorta and its branch vessels. No adenopathy. Reproductive: Uterus is absent.  Ovaries grossly unremarkable. Other: No free air or fluid. Musculoskeletal: No acute osseous abnormality. No worrisome lytic or blastic osseous lesions. IMPRESSION: 1. 7 mm obstructive stone at the left UVJ with secondary moderate left hydroureteronephrosis.  2. No other acute intra-abdominal or pelvic process identified. Electronically Signed   By: Jeannine Boga M.D.   On: 12/25/2016 05:14    ED COURSE  Nursing notes and initial vitals signs, including pulse oximetry, reviewed.  Vitals:   12/25/16 0130 12/25/16 0200 12/25/16 0230 12/25/16 0330  BP: (!) 196/114 (!) 186/112 (!) 203/116 (!) 197/119  Pulse: 76 73 82 70  Resp: 14 18 21 19   Temp:      TempSrc:      SpO2: 93% 93% 95% 96%    PROCEDURES    ED DIAGNOSES     ICD-9-CM ICD-10-CM   1. Ureterolithiasis 592.1 N20.1   2. Hypokalemia 276.8 E87.6        Shanon Rosser, MD 12/25/16 [phone]

## 2016-12-25 NOTE — ED Notes (Signed)
Reminded pt to hold Janumet for 48 hours after CT scan.

## 2017-06-06 ENCOUNTER — Other Ambulatory Visit: Payer: Self-pay

## 2017-06-06 DIAGNOSIS — I8311 Varicose veins of right lower extremity with inflammation: Secondary | ICD-10-CM

## 2017-08-15 ENCOUNTER — Encounter: Payer: Self-pay | Admitting: Vascular Surgery

## 2017-08-15 ENCOUNTER — Ambulatory Visit (INDEPENDENT_AMBULATORY_CARE_PROVIDER_SITE_OTHER): Payer: BLUE CROSS/BLUE SHIELD | Admitting: Vascular Surgery

## 2017-08-15 VITALS — BP 147/91 | HR 74 | Temp 98.4°F | Resp 16 | Ht 65.0 in | Wt 170.0 lb

## 2017-08-15 DIAGNOSIS — I8393 Asymptomatic varicose veins of bilateral lower extremities: Secondary | ICD-10-CM | POA: Diagnosis not present

## 2017-08-15 NOTE — Progress Notes (Signed)
Subjective:     Patient ID: Michele Wang, female   DOB: 1951-07-15, 66 y.o.   MRN: [id number]  HPI This 66 year old female was referred by Dr. Bryon Lions for evaluation of venous disease right leg with swelling around the knee. This patient has had spider veins in bilateral thighs for many years. She has also had a "lump" in her lower right thigh adjacent to the knee joint medially. She states this is not uncomfortable and has not changed size in many years. She has never had it evaluated. She has no history of DVT thrombophlebitis stasis ulcers or bleeding. She does not elastic compression stockings. She developed some very mild swelling of the day goes by symmetrically in the right and left legs.  Past Medical History:  Diagnosis Date  . Diabetes mellitus without complication ([place])    Type II  . Hypertension   . UTI (urinary tract infection)     Social History  Substance Use Topics  . Smoking status: Never Smoker  . Smokeless tobacco: Never Used  . Alcohol use No    History reviewed. No pertinent family history.  No Known Allergies   Current Outpatient Prescriptions:  .  Olmesartan-Amlodipine-HCTZ (TRIBENZOR) 40-5-25 MG TABS, Take 1 tablet by mouth daily., Disp: , Rfl:  .  sitaGLIPtin-metformin (JANUMET) 50-500 MG tablet, Take 1 tablet by mouth 2 (two) times daily with a meal., Disp: , Rfl:  .  ondansetron (ZOFRAN ODT) 4 MG disintegrating tablet, Take 1 tablet (4 mg total) by mouth every 8 (eight) hours as needed for nausea or vomiting. (Patient not taking: Reported on 08/15/2017), Disp: 10 tablet, Rfl: 0 .  oxyCODONE-acetaminophen (PERCOCET) 5-325 MG tablet, Take 1-2 tablets by mouth every 6 (six) hours as needed (for pain). (Patient not taking: Reported on 08/15/2017), Disp: 30 tablet, Rfl: 0 .  potassium chloride SA (K-DUR,KLOR-CON) 20 MEQ tablet, Take 1 tablet (20 mEq total) by mouth 2 (two) times daily. (Patient not taking: Reported on 08/15/2017), Disp: 14 tablet, Rfl: 0 .   tamsulosin (FLOMAX) 0.4 MG CAPS capsule, Take one capsule daily until stone passes. (Patient not taking: Reported on 08/15/2017), Disp: 15 capsule, Rfl: 0  Vitals:   08/15/17 1237  BP: (!) 147/91  Pulse: 74  Resp: 16  Temp: 98.4 F (36.9 C)  SpO2: 98%  Weight: 170 lb (77.1 kg)  Height: 5\' 5"  (1.651 m)    Body mass index is 28.29 kg/m.         Review of Systems Denies chest pain, dyspnea or exertion, PND, orthopnea, hemoptysis, claudication. Has type 2 diabetes mellitus, hypertension.    Objective:   Physical Exam BP (!) 147/91   Pulse 74   Temp 98.4 F (36.9 C)   Resp 16   Ht 5\' 5"  (1.651 m)   Wt 170 lb (77.1 kg)   SpO2 98%   BMI 28.29 kg/m     Gen.-alert and oriented x3 in no apparent distress HEENT normal for age Lungs no rhonchi or wheezing Cardiovascular regular rhythm no murmurs carotid pulses 3+ palpable no bruits audible Abdomen soft nontender no palpable masses Musculoskeletal free of  major deformities Skin clear -no rashes Neurologic normal Lower extremities 3+ femoral and dorsalis pedis pulses palpable bilaterally with no edema Pattern of cutaneous telangiectasia medial thighs symmetrically right and left and also other areas of spider veins in the lateral thighs and medial and lateral calf areas. Localized area of swelling right distal medial thigh which is nontender to palpation. This is not  a discrete mass. No bruit is audible overlying this. It is not pulsatile. Patient has excellent circulation distally with well-perfused lower extremities.  Today I performed a bedside SonoSite ultrasound exam of the right leg and examine the great saphenous vein which was very small in caliber with no evidence of reflux or abnormality. There was no evidence of arterial venous malformation or fistula in the medial thigh area.        Assessment:    #1 spider veins bilateral lower extremities-asymptomatic #2 localized swelling or mass in right distal medial  thigh-likely lipoma-has been present for many years and is asymptomatic    Plan:    If the patient or Dr. Baird Cancer desires further evaluation this can be seen by a general surgeon or an MRI scan could be obtained to get a better idea of what this represents. This is likely a lipoma and the fact that it is been present for many years without changing size makes it unlikely that this is a dangerous mass. I discussed this with the patient and the next step for evaluating if she desires Is not related to the arterial or venous system

## 2018-01-13 IMAGING — CT CT ABD-PELV W/ CM
2 of 8 series · 15 of 46 positions shown, 17 images · IV contrast (APPLIED)
Comparison: None.

CLINICAL DATA: Initial evaluation for acute lower abdominal pain
for 2 days.

EXAM:
CT ABDOMEN AND PELVIS WITH CONTRAST
TECHNIQUE: Multidetector CT imaging of the abdomen and pelvis was performed
using the standard protocol following bolus administration of
intravenous contrast.
CONTRAST:  100mL [id number] IOPAMIDOL ([id number]) INJECTION 61%

[Series 2: axial st · axial · 0.73mm/px · z∈[-760,-380]mm · 12 of 88 slices shown, 14 images]
[im 6/88  soft-tissue]
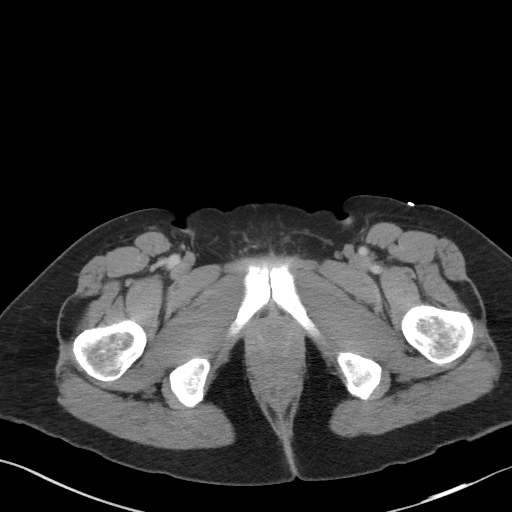
[im 6/88  bone]
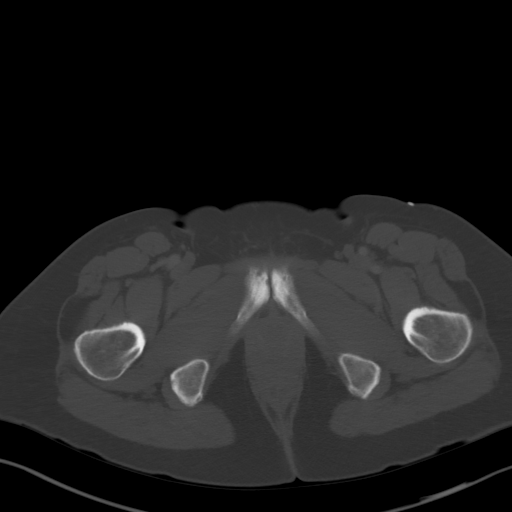
[im 11/88  soft-tissue]
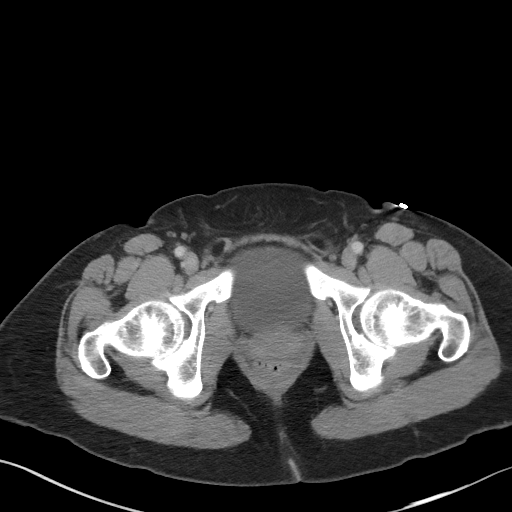
[im 22/88  soft-tissue]
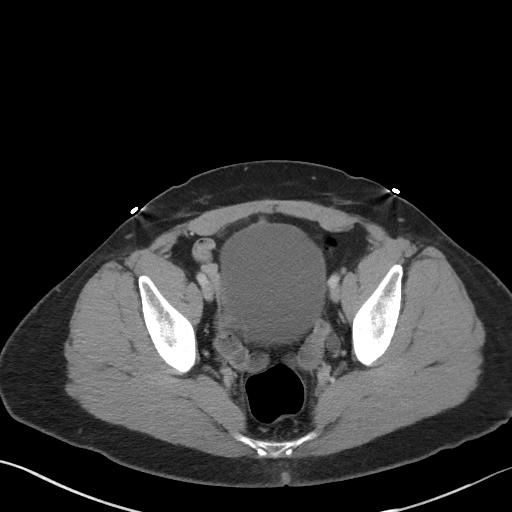
[im 28/88  soft-tissue]
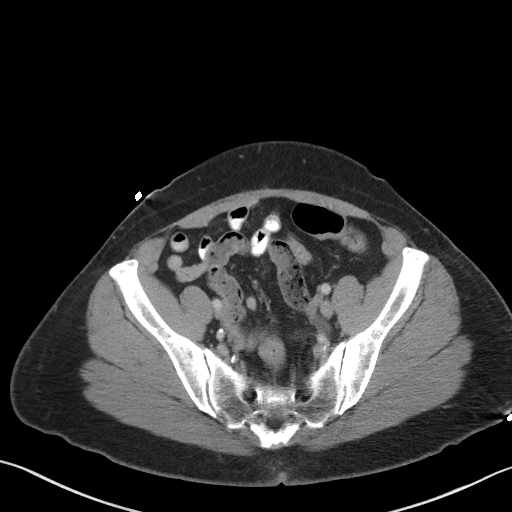
[im 33/88  soft-tissue]
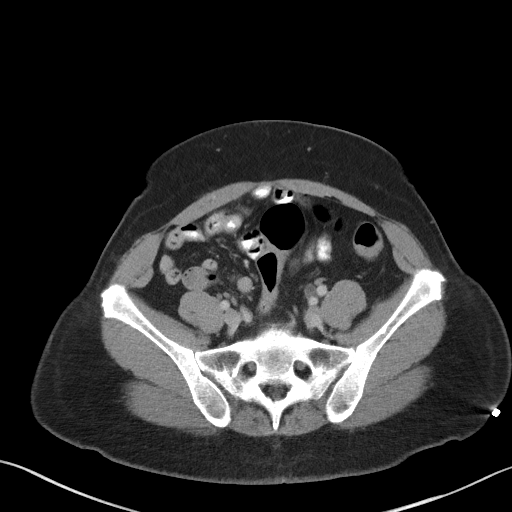
[im 39/88  soft-tissue]
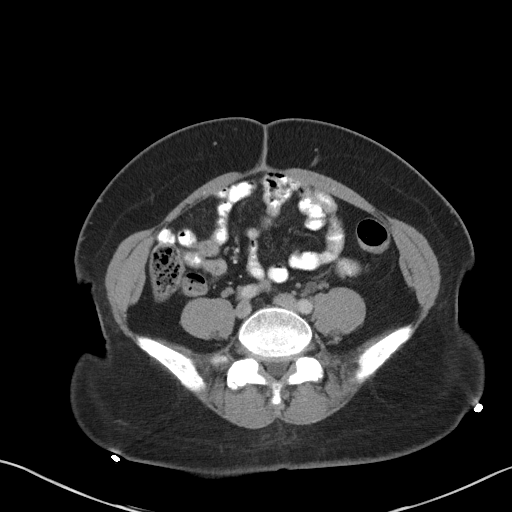
[im 49/88  soft-tissue]
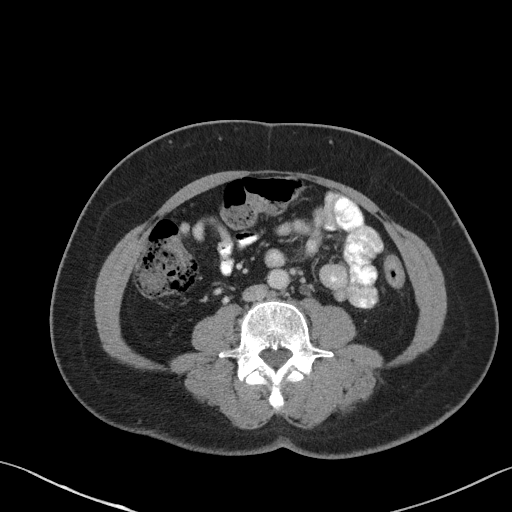
[im 55/88  soft-tissue]
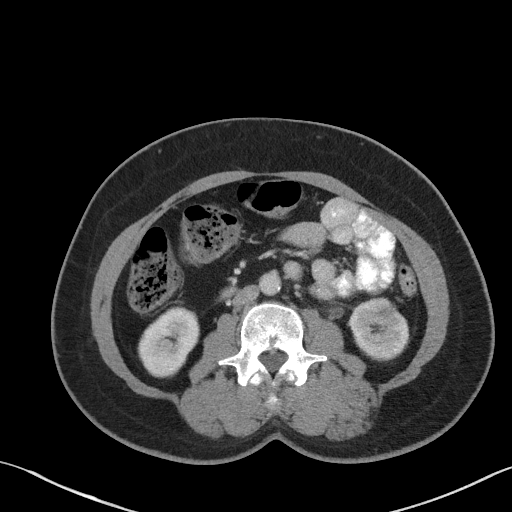
[im 60/88  soft-tissue]
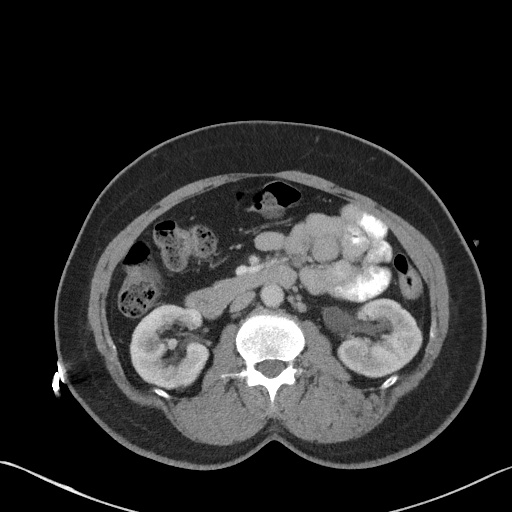
[im 60/88  bone]
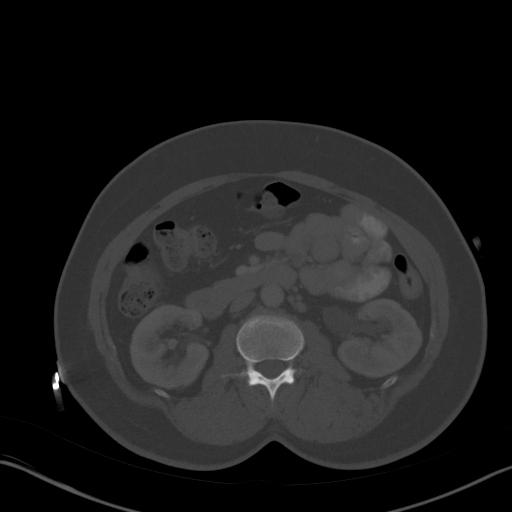
[im 66/88  soft-tissue]
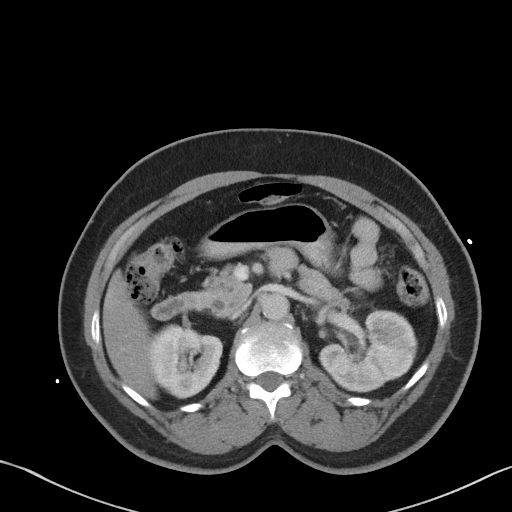
[im 77/88  soft-tissue]
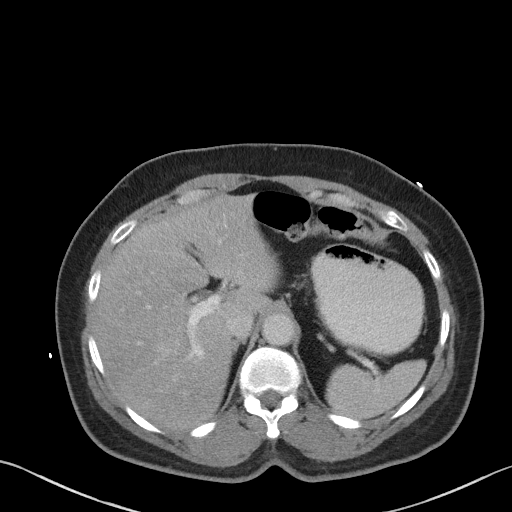
[im 82/88  soft-tissue]
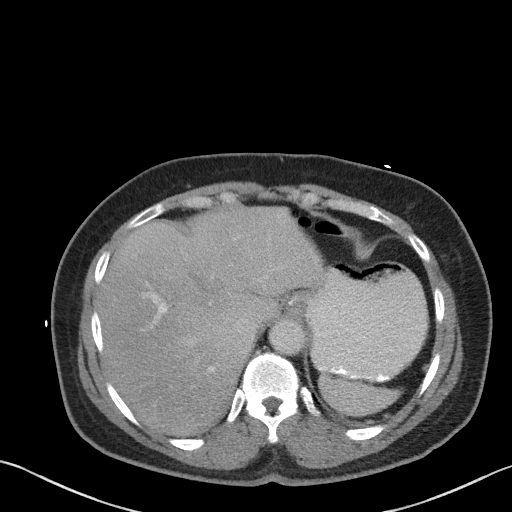

[Series 5: coronal st · coronal · 0.64mm/px · 3 of 101 slices shown]
[im 26/101  soft-tissue]
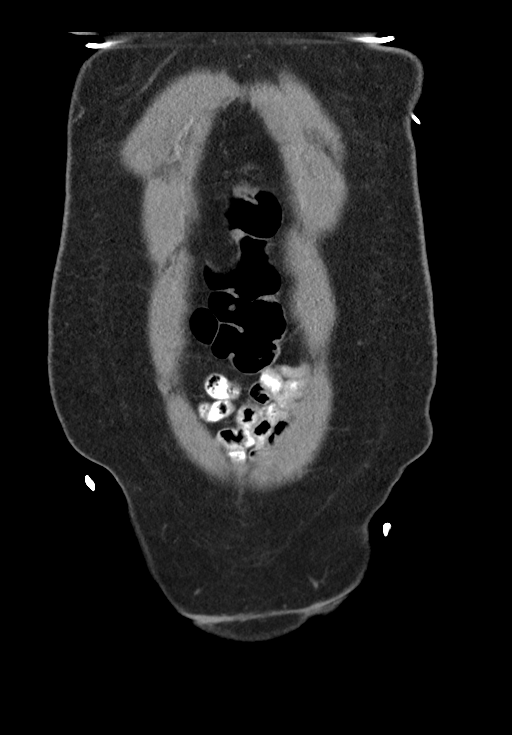
[im 51/101  soft-tissue]
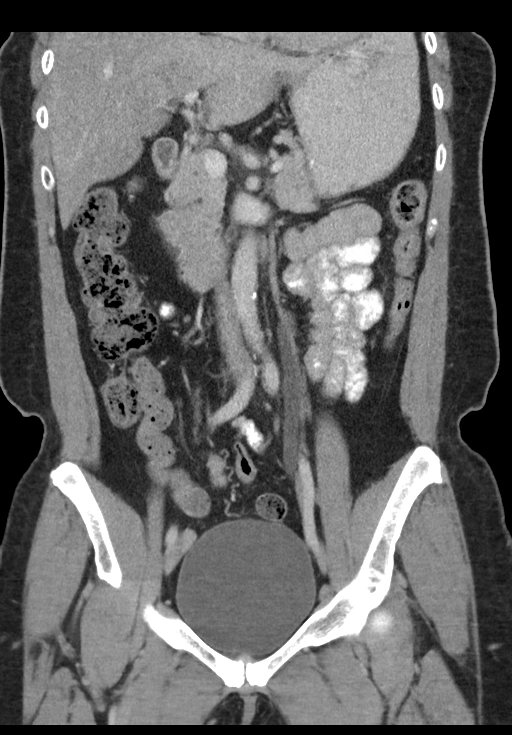
[im 76/101  soft-tissue]
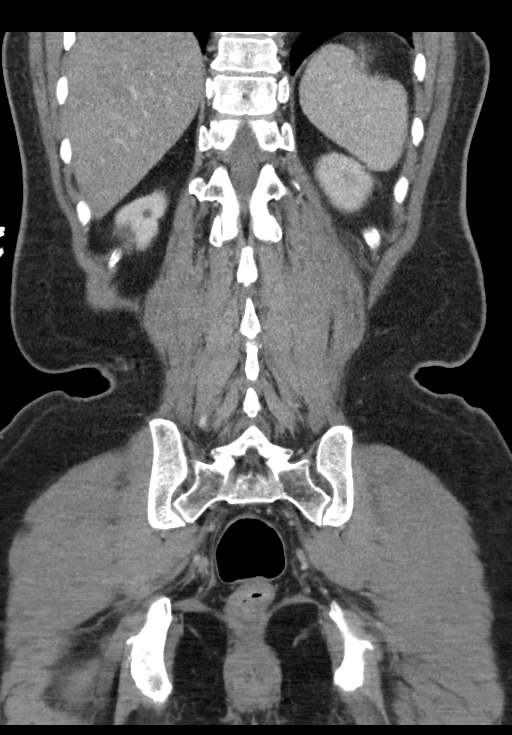

[15 of 46 positions shown; findings below may reference images not displayed]

FINDINGS: Lower chest: Deep tendon atelectasis present within the visualized
lung bases. Visualized lungs are otherwise clear.

Hepatobiliary: The liver within normal limits. Gallbladder
surgically absent. No biliary dilatation.

Pancreas: Pancreas within normal limits.

Spleen: Spleen within normal limits.

Adrenals/Urinary Tract: Adrenal glands are normal. U small scattered
cysts noted within the right kidney. Right kidney otherwise
unremarkable without evidence for nephrolithiasis, hydronephrosis,
or focal enhancing renal mass. On the left, there is an obstructive
7 mm calcification at the left UVJ with secondary moderate left
hydroureteronephrosis. Nephrogram is slightly delayed on the left.
No other left renal calculi identified. No other stones seen within
the dilated left ureter. Superimposed small left renal cysts noted.

Bladder within normal limits.

Stomach/Bowel: Enteric contrast material present within knee
esophageal lumen. Stomach within normal limits. No evidence for
bowel obstruction. Appendix normal. No acute inflammatory changes
seen about the bowels.

Vascular/Lymphatic: Normal intravascular enhancement seen throughout
the intra-abdominal aorta and its branch vessels. No adenopathy.

Reproductive: Uterus is absent.  Ovaries grossly unremarkable.

Other: No free air or fluid.

Musculoskeletal: No acute osseous abnormality. No worrisome lytic or
blastic osseous lesions.
IMPRESSION: 1. 7 mm obstructive stone at the left UVJ with secondary moderate
left hydroureteronephrosis.
2. No other acute intra-abdominal or pelvic process identified.

## 2018-04-17 ENCOUNTER — Emergency Department (HOSPITAL_BASED_OUTPATIENT_CLINIC_OR_DEPARTMENT_OTHER)
Admission: EM | Admit: 2018-04-17 | Discharge: 2018-04-17 | Disposition: A | Discharge status: Home / Self Care | Payer: BLUE CROSS/BLUE SHIELD | Attending: Emergency Medicine | Admitting: Emergency Medicine

## 2018-04-17 ENCOUNTER — Other Ambulatory Visit: Payer: Self-pay

## 2018-04-17 ENCOUNTER — Encounter (HOSPITAL_BASED_OUTPATIENT_CLINIC_OR_DEPARTMENT_OTHER): Payer: Self-pay

## 2018-04-17 DIAGNOSIS — R51 Headache: Secondary | ICD-10-CM | POA: Diagnosis not present

## 2018-04-17 DIAGNOSIS — E119 Type 2 diabetes mellitus without complications: Secondary | ICD-10-CM | POA: Insufficient documentation

## 2018-04-17 DIAGNOSIS — Z79899 Other long term (current) drug therapy: Secondary | ICD-10-CM | POA: Insufficient documentation

## 2018-04-17 DIAGNOSIS — R519 Headache, unspecified: Secondary | ICD-10-CM

## 2018-04-17 DIAGNOSIS — I10 Essential (primary) hypertension: Secondary | ICD-10-CM | POA: Insufficient documentation

## 2018-04-17 MED ORDER — ACETAMINOPHEN 500 MG PO TABS
ORAL_TABLET | ORAL | Status: AC
  Filled 2018-04-17: qty 2

## 2018-04-17 MED ORDER — ACETAMINOPHEN 500 MG PO TABS
1000.0000 mg | ORAL_TABLET | Freq: Once | ORAL | Status: AC
  Administered 2018-04-17: 1000 mg via ORAL

## 2018-04-17 MED ORDER — HYDROCODONE-ACETAMINOPHEN 5-325 MG PO TABS
1.0000 | ORAL_TABLET | Freq: Once | ORAL | Status: AC
  Administered 2018-04-17: 1 via ORAL
  Filled 2018-04-17: qty 1

## 2018-04-17 NOTE — ED Provider Notes (Signed)
[facility] EMERGENCY DEPARTMENT Provider Note   CSN: [id number] Arrival date & time: 04/17/18  9470     History   Chief Complaint No chief complaint on file. chief complaint headache. "My blood pressures up"  HPI Michele Wang is a 67 y.o. female.reportsoccipital headache gradual onset of occipital headac onset 2 days ago. Nonradiating. Nothing makes symptoms better or worse. She ran out of her blood pressure medicine and restarted blood pressure medicinestarted  Yesterday. She denies visual changes denies shortness of breath denies chest pain. No other associated symptoms. Nothing makes symptoms better or worse. No fever no trauma no other assocptoms  HPI  Past Medical History:  Diagnosis Date  . Diabetes mellitus without complication ([place])    Type II  . Hypertension   . UTI (urinary tract infection)     Patient Active Problem List   Diagnosis Date Noted  . Spider veins of both lower extremities 08/15/2017    Past Surgical History:  Procedure Laterality Date  . CESAREAN SECTION     x2     OB History   None      Home Medications    Prior to Admission medications   Medication Sig Start Date End Date Taking? Authorizing Provider  Olmesartan-Amlodipine-HCTZ (TRIBENZOR) 40-5-25 MG TABS Take 1 tablet by mouth daily.    [provider]  ondansetron (ZOFRAN ODT) 4 MG disintegrating tablet Take 1 tablet (4 mg total) by mouth every 8 (eight) hours as needed for nausea or vomiting. Patient not taking: Reported on 08/15/2017 12/22/16   Leo Grosser, MD  oxyCODONE-acetaminophen (PERCOCET) 5-325 MG tablet Take 1-2 tablets by mouth every 6 (six) hours as needed (for pain). Patient not taking: Reported on 08/15/2017 12/25/16   Molpus, Jenny Reichmann, MD  potassium chloride SA (K-DUR,KLOR-CON) 20 MEQ tablet Take 1 tablet (20 mEq total) by mouth 2 (two) times daily. Patient not taking: Reported on 08/15/2017 12/25/16   Molpus, Jenny Reichmann, MD  sitaGLIPtin-metformin (JANUMET) 50-500  MG tablet Take 1 tablet by mouth 2 (two) times daily with a meal.    [provider]  tamsulosin (FLOMAX) 0.4 MG CAPS capsule Take one capsule daily until stone passes. Patient not taking: Reported on 08/15/2017 12/25/16   Molpus, Jenny Reichmann, MD    Family History No family history on file.  Social History Social History   Tobacco Use  . Smoking status: Never Smoker  . Smokeless tobacco: Never Used  Substance Use Topics  . Alcohol use: No  . Drug use: No     Allergies   Patient has no known allergies.   Review of Systems Review of Systems  Constitutional: Negative.   HENT: Negative.   Respiratory: Negative.   Cardiovascular: Negative.   Gastrointestinal: Negative.   Musculoskeletal: Negative.   Skin: Negative.   Allergic/Immunologic: Positive for immunocompromised state.       Diabetic  Neurological: Positive for headaches.  Psychiatric/Behavioral: Negative.   All other systems reviewed and are negative.    Physical Exam Updated Vital Signs BP (!) 162/105 (BP Location: Right Arm)   Pulse 65   Temp 98 F (36.7 C) (Oral)   Resp 18   Ht 5\' 6"  (1.676 m)   Wt 77.1 kg (170 lb)   SpO2 97%   BMI 27.44 kg/m   Physical Exam  Constitutional: She is oriented to person, place, and time. She appears well-developed and well-nourished.  HENT:  Head: Normocephalic and atraumatic.  No facial asymmetry  Eyes: Pupils are equal, round, and reactive  to light. Conjunctivae are normal.  Fundi benign  Neck: Neck supple. No tracheal deviation present. No thyromegaly present.  No bruit. No signs of meningitis  Cardiovascular: Normal rate, regular rhythm and normal heart sounds.  No murmur heard. Pulmonary/Chest: Effort normal and breath sounds normal.  Abdominal: Soft. Bowel sounds are normal. She exhibits no distension. There is no tenderness.  Musculoskeletal: Normal range of motion. She exhibits no edema or tenderness.  Neurological: She is alert and oriented to person,  place, and time. No cranial nerve deficit. Coordination normal.  Gait normal Romberg normal pronator drift normal finger to nose normal  Skin: Skin is warm and dry. No rash noted.  Psychiatric: She has a normal mood and affect.  Nursing note and vitals reviewed.    ED Treatments / Results  Labs (all labs ordered are listed, but only abnormal results are displayed) Labs Reviewed - No data to display  EKG None  Radiology No results found.  Procedures Procedures (including critical care time)  Medications Ordered in ED Medications  acetaminophen (TYLENOL) tablet 1,000 mg (has no administration in time range)     Initial Impression / Assessment and Plan / ED Course  I have reviewed the triage vital signs and the nursing notes.  Pertinent labs & imaging results that were available during my care of the patient were reviewed by me and considered in my medical decision making (see chart for details).     10:40 AMshe reports headache is improved however she is requesting more pain medicine. Norco ordered.  11:15 AM headache much improved she feels ready to go home she is alert ambulatory Glasgow Coma Score 15.  No signs of hypertensive and cephalopathy . Doubt serious withheadache gradual in onset several days ago. Nonfocal neurologic exam. Patient not ill-appearing. Plan Tylenol for pain. Repeat blood pressure 3 weeks Final Clinical Impressions(s) / ED Diagnoses  Diagnosis #1 bad headache  #2 elevated blood pressure Final diagnoses:  None    ED Discharge Orders    None       Orlie Dakin, MD 04/17/18 1117

## 2018-04-17 NOTE — ED Triage Notes (Signed)
Pt c/o headache from her high blood pressure, states was out of b/p meds for 3days

## 2018-04-17 NOTE — Discharge Instructions (Addendum)
You can take Tylenol every 4 hours as needed for headache. Rest and stay in a cool calm environment for the rest of today.No driving until tomorrow. Call Dr.Sanders's office today to schedule appointment for within the next 3 weeks to get your blood pressure rechecked in her office. Make sure that you take your blood pressure medication as prescribed

## 2018-11-08 ENCOUNTER — Encounter: Payer: Self-pay | Admitting: Internal Medicine

## 2018-11-08 ENCOUNTER — Ambulatory Visit: Payer: BLUE CROSS/BLUE SHIELD | Admitting: Internal Medicine

## 2018-11-08 VITALS — BP 126/84 | HR 71 | Temp 98.3°F | Ht 67.5 in | Wt 167.4 lb

## 2018-11-08 DIAGNOSIS — E1122 Type 2 diabetes mellitus with diabetic chronic kidney disease: Secondary | ICD-10-CM | POA: Diagnosis not present

## 2018-11-08 DIAGNOSIS — N183 Chronic kidney disease, stage 3 unspecified: Secondary | ICD-10-CM

## 2018-11-08 DIAGNOSIS — Z794 Long term (current) use of insulin: Secondary | ICD-10-CM | POA: Insufficient documentation

## 2018-11-08 DIAGNOSIS — E7800 Pure hypercholesterolemia, unspecified: Secondary | ICD-10-CM

## 2018-11-08 DIAGNOSIS — I129 Hypertensive chronic kidney disease with stage 1 through stage 4 chronic kidney disease, or unspecified chronic kidney disease: Secondary | ICD-10-CM | POA: Insufficient documentation

## 2018-11-08 DIAGNOSIS — Z9114 Patient's other noncompliance with medication regimen: Secondary | ICD-10-CM

## 2018-11-08 DIAGNOSIS — Z79899 Other long term (current) drug therapy: Secondary | ICD-10-CM

## 2018-11-08 DIAGNOSIS — N1832 Chronic kidney disease, stage 3b: Secondary | ICD-10-CM | POA: Insufficient documentation

## 2018-11-08 MED ORDER — PRAVASTATIN SODIUM 40 MG PO TABS: ORAL_TABLET | ORAL | 2 refills | Status: DC

## 2018-11-08 NOTE — Progress Notes (Signed)
*  Subjective:     Patient ID: Michele Wang , female    DOB: 1951-09-01 , 67 y.o.   MRN: [id number]   Chief Complaint  Patient presents with  . Diabetes  . Hypertension    HPI  Diabetes  She presents for her follow-up diabetic visit. She has type 2 diabetes mellitus. Her disease course has been stable. There are no hypoglycemic associated symptoms. Pertinent negatives for hypoglycemia include no headaches. There are no diabetic associated symptoms. Pertinent negatives for diabetes include no blurred vision and no chest pain. There are no hypoglycemic complications. Symptoms are stable. Diabetic complications include nephropathy. Risk factors for coronary artery disease include diabetes mellitus, dyslipidemia, hypertension, obesity, post-menopausal and sedentary lifestyle. She is compliant with treatment most of the time. She is following a diabetic diet. Her breakfast blood glucose is taken between 8-9 am. Her breakfast blood glucose range is generally 110-130 mg/dl.  Hypertension  This is a chronic problem. The current episode started more than 1 year ago. The problem has been gradually improving since onset. The problem is controlled. Pertinent negatives include no blurred vision, chest pain, headaches or shortness of breath.  SHE REPORTS COMPLIANCE WITH MEDS.    Past Medical History:  Diagnosis Date  . Diabetes mellitus without complication ([place])    Type II  . Hypertension   . UTI (urinary tract infection)      Family History  Problem Relation Age of Onset  . Aneurysm Mother   . Healthy Father      Current Outpatient Medications:  .  Olmesartan-Amlodipine-HCTZ (TRIBENZOR) 40-5-25 MG TABS, Take 1 tablet by mouth daily., Disp: , Rfl:  .  Omega-3 Fatty Acids (FISH OIL PO), Take by mouth., Disp: , Rfl:  .  sitaGLIPtin-metformin (JANUMET) 50-500 MG tablet, Take 1 tablet by mouth 2 (two) times daily with a meal., Disp: , Rfl:  .  Vitamin D, Cholecalciferol, 25 MCG (1000 UT) CAPS,  Take by mouth., Disp: , Rfl:  .  pravastatin (PRAVACHOL) 40 MG tablet, One tab po three times weekly/MWF, Disp: 45 tablet, Rfl: 2   No Known Allergies   Review of Systems  Constitutional: Negative.   Eyes: Negative for blurred vision.  Respiratory: Negative.  Negative for shortness of breath.   Cardiovascular: Negative.  Negative for chest pain.  Genitourinary: Negative.   Neurological: Negative.  Negative for headaches.  Psychiatric/Behavioral: Negative.      Today's Vitals   11/08/18 0849  BP: 126/84  Pulse: 71  Temp: 98.3 F (36.8 C)  TempSrc: Oral  Weight: 167 lb 6.4 oz (75.9 kg)  Height: 5' 7.5" (1.715 m)  PainSc: 0-No pain   Body mass index is 25.83 kg/m.   Objective:  Physical Exam Vitals signs and nursing note reviewed.  Constitutional:      Appearance: Normal appearance.  HENT:     Head: Normocephalic and atraumatic.     Nose: Nose normal.  Neck:     Musculoskeletal: Normal range of motion and neck supple.  Cardiovascular:     Rate and Rhythm: Normal rate and regular rhythm.     Heart sounds: Normal heart sounds.  Pulmonary:     Effort: Pulmonary effort is normal.     Breath sounds: Normal breath sounds.  Neurological:     General: No focal deficit present.     Mental Status: She is alert and oriented to person, place, and time.  Psychiatric:        Mood and Affect: Mood normal.  Assessment And Plan:     1. Type 2 diabetes mellitus with stage 3 chronic kidney disease, without long-term current use of insulin ([place])  I will check labs as listed below. She plans to retire soon. She is encouraged to incorporate more exercise into her daily routine.  She will rto in March 2020 for her welcome to medicare visit and dm check.  - CMP14+EGFR - Hemoglobin A1c  2. Chronic renal disease, stage III ([facility])  Chronic. She is encouraged to stay well hydrated.   3. Hypertensive nephropathy  Well controlled. She will continue with current meds. She is  encouraged to avoid adding salt to her foods.   4. Pure hypercholesterolemia    She admits that she has not been taking cholesterol meds regularly. She agrees to take MWF. rx was sent to the pharmacy with these directions. Importance of compliance was discussed wit hthe patient.   5. Drug therapy   [name], MD

## 2018-11-08 NOTE — Patient Instructions (Signed)

## 2018-11-09 LAB — CMP14+EGFR
ALT: 39 IU/L — ABNORMAL HIGH (ref 0–32)
AST: 33 IU/L (ref 0–40)
Albumin/Globulin Ratio: 1.4 (ref 1.2–2.2)
Albumin: 4.4 g/dL (ref 3.6–4.8)
Alkaline Phosphatase: 77 IU/L (ref 39–117)
BUN/Creatinine Ratio: 19 (ref 12–28)
BUN: 21 mg/dL (ref 8–27)
Bilirubin Total: 0.5 mg/dL (ref 0.0–1.2)
CO2: 27 mmol/L (ref 20–29)
Calcium: 10.5 mg/dL — ABNORMAL HIGH (ref 8.7–10.3)
Chloride: 96 mmol/L (ref 96–106)
Creatinine, Ser: 1.09 mg/dL — ABNORMAL HIGH (ref 0.57–1.00)
GFR calc Af Amer: 61 mL/min/{1.73_m2} (ref >59)
GFR calc non Af Amer: 53 mL/min/{1.73_m2} — ABNORMAL LOW (ref >59)
Globulin, Total: 3.2 g/dL (ref 1.5–4.5)
Glucose: 126 mg/dL — ABNORMAL HIGH (ref 65–99)
Potassium: 4.3 mmol/L (ref 3.5–5.2)
Sodium: 139 mmol/L (ref 134–144)
Total Protein: 7.6 g/dL (ref 6.0–8.5)

## 2018-11-09 LAB — HEMOGLOBIN A1C
Est. average glucose Bld gHb Est-mCnc: 154 mg/dL
Hgb A1c MFr Bld: 7 % — ABNORMAL HIGH (ref 4.8–5.6)

## 2018-11-10 NOTE — Progress Notes (Signed)
Your kidney fxn Is stable, it has improved some. Be sure to stay well hydrated. Your liver enzymes are slightly elevated. This implies you need to increase your exercise and cut out processed foods. Your hba1c is 7.0, this is pretty good. This will get even better once you have retired, and you have more time to exercise!  Happy holidays!

## 2019-01-22 ENCOUNTER — Other Ambulatory Visit: Payer: Self-pay

## 2019-01-22 MED ORDER — OLMESARTAN-AMLODIPINE-HCTZ 40-5-25 MG PO TABS: 1.0000 | ORAL_TABLET | Freq: Every day | ORAL | 2 refills | Status: DC

## 2019-02-05 ENCOUNTER — Encounter: Payer: Self-pay | Admitting: Internal Medicine

## 2019-02-05 ENCOUNTER — Ambulatory Visit (INDEPENDENT_AMBULATORY_CARE_PROVIDER_SITE_OTHER): Payer: Medicare Other | Admitting: Internal Medicine

## 2019-02-05 VITALS — BP 132/94 | HR 83 | Temp 98.0°F | Ht 63.8 in | Wt 166.8 lb

## 2019-02-05 DIAGNOSIS — N183 Chronic kidney disease, stage 3 unspecified: Secondary | ICD-10-CM

## 2019-02-05 DIAGNOSIS — R0982 Postnasal drip: Secondary | ICD-10-CM

## 2019-02-05 DIAGNOSIS — I129 Hypertensive chronic kidney disease with stage 1 through stage 4 chronic kidney disease, or unspecified chronic kidney disease: Secondary | ICD-10-CM | POA: Diagnosis not present

## 2019-02-05 DIAGNOSIS — E663 Overweight: Secondary | ICD-10-CM

## 2019-02-05 DIAGNOSIS — Z23 Encounter for immunization: Secondary | ICD-10-CM

## 2019-02-05 DIAGNOSIS — Z0000 Encounter for general adult medical examination without abnormal findings: Secondary | ICD-10-CM

## 2019-02-05 DIAGNOSIS — E7800 Pure hypercholesterolemia, unspecified: Secondary | ICD-10-CM

## 2019-02-05 DIAGNOSIS — E1122 Type 2 diabetes mellitus with diabetic chronic kidney disease: Secondary | ICD-10-CM

## 2019-02-05 LAB — POCT URINALYSIS DIPSTICK
Bilirubin, UA: NEGATIVE
Glucose, UA: NEGATIVE
Ketones, UA: NEGATIVE
Nitrite, UA: NEGATIVE
Protein, UA: POSITIVE — AB
Spec Grav, UA: 1.02 (ref 1.010–1.025)
Urobilinogen, UA: 0.2 E.U./dL
pH, UA: 6 (ref 5.0–8.0)

## 2019-02-05 LAB — POCT UA - MICROALBUMIN
Albumin/Creatinine Ratio, Urine, POC: 30
Creatinine, POC: 200 mg/dL
Microalbumin Ur, POC: 150 mg/L

## 2019-02-05 NOTE — Progress Notes (Signed)
Subjective:    Michele Wang is a 68 y.o. female who presents for a Welcome to Medicare exam.   She is here today for her initial Medicare wellness visit. She is followed by GYN for her pelvic examinations.   Diabetes  She presents for her follow-up diabetic visit. She has type 2 diabetes mellitus. Her disease course has been stable. There are no hypoglycemic associated symptoms. Pertinent negatives for diabetes include no blurred vision and no chest pain. There are no hypoglycemic complications. Risk factors for coronary artery disease include diabetes mellitus, dyslipidemia, hypertension, post-menopausal and sedentary lifestyle. She is following a generally healthy diet. She participates in exercise intermittently. Her home blood glucose trend is fluctuating minimally. Her breakfast blood glucose is taken between 8-9 am. Her breakfast blood glucose range is generally 110-130 mg/dl. An ACE inhibitor/angiotensin II receptor blocker is being taken.  Hypertension  This is a chronic problem. The current episode started more than 1 year ago. The problem has been gradually improving since onset. Pertinent negatives include no blurred vision, chest pain, palpitations or shortness of breath.  She reports compliance with meds.    Review of Systems  Review of Systems  Constitutional: Negative.   HENT: Positive for congestion.        She c/o runny nose. Also with drainage down the back of her throat.   Eyes: Negative for blurred vision.  Respiratory: Negative.  Negative for shortness of breath.   Cardiovascular: Negative.  Negative for chest pain and palpitations.  Gastrointestinal: Negative.   Genitourinary: Negative.   Musculoskeletal: Negative.   Skin: Negative.   Neurological: Negative.   Endo/Heme/Allergies: Negative.   Psychiatric/Behavioral: Negative.    Cardiac Risk Factors include: hypertension, diabetes, high cholesterol, postmenopausal, sedentary.       Objective:    Today's  Vitals   02/05/19 1119 02/05/19 1146  BP: (!) 132/94   Pulse: 83   Temp: 98 F (36.7 C)   TempSrc: Oral   Weight: 166 lb 12.8 oz (75.7 kg)   Height: 5' 3.8" (1.621 m)   PainSc: 0-No pain 0-No pain  Body mass index is 28.81 kg/m.  Medications Outpatient Encounter Medications as of 02/05/2019  Medication Sig  . Olmesartan-amLODIPine-HCTZ (TRIBENZOR) 40-5-25 MG TABS Take 1 tablet by mouth daily.  . Omega-3 Fatty Acids (FISH OIL PO) Take by mouth.  . sitaGLIPtin-metformin (JANUMET) 50-500 MG tablet Take 1 tablet by mouth 2 (two) times daily with a meal.  . [DISCONTINUED] pravastatin (PRAVACHOL) 40 MG tablet One tab po three times weekly/MWF  . Vitamin D, Cholecalciferol, 25 MCG (1000 UT) CAPS Take by mouth.   No facility-administered encounter medications on file as of 02/05/2019.      History: Past Medical History:  Diagnosis Date  . Diabetes mellitus without complication ([place])    Type II  . Hyperlipidemia   . Hypertension   . UTI (urinary tract infection)    Past Surgical History:  Procedure Laterality Date  . CESAREAN SECTION     x2    Family History  Problem Relation Age of Onset  . Aneurysm Mother   . Healthy Father    Social History   Occupational History  . Not on file  Tobacco Use  . Smoking status: Never Smoker  . Smokeless tobacco: Never Used  Substance and Sexual Activity  . Alcohol use: No  . Drug use: No  . Sexual activity: Not on file    Tobacco Counseling Counseling given: Not Answered   Immunizations  and Health Maintenance Immunization History  Administered Date(s) Administered  . DTaP 08/03/2007, 10/14/2014  . Pneumococcal Conjugate-13 03/03/2014  . Pneumococcal Polysaccharide-23 01/30/2016, 02/05/2019   There are no preventive care reminders to display for this patient.  Activities of Daily Living In your present state of health, do you have any difficulty performing the following activities: 02/05/2019  Hearing? N  Vision? Y    Difficulty concentrating or making decisions? N  Walking or climbing stairs? N  Dressing or bathing? N  Doing errands, shopping? N  Preparing Food and eating ? N  Using the Toilet? N  In the past six months, have you accidently leaked urine? Y  Do you have problems with loss of bowel control? N  Managing your Medications? N  Managing your Finances? N  Housekeeping or managing your Housekeeping? N  Some recent data might be hidden   Physical Exam  Physical Exam:  Today's Vitals   02/05/19 1119 02/05/19 1146  BP: (!) 132/94   Pulse: 83   Temp: 98 F (36.7 C)   TempSrc: Oral   Weight: 166 lb 12.8 oz (75.7 kg)   Height: 5' 3.8" (1.621 m)   PainSc: 0-No pain 0-No pain   Body mass index is 28.81 kg/m.  BP (!) 132/94 (BP Location: Left Arm, Patient Position: Sitting, Cuff Size: Normal)   Pulse 83   Temp 98 F (36.7 C) (Oral)   Ht 5' 3.8" (1.621 m)   Wt 166 lb 12.8 oz (75.7 kg)   BMI 28.81 kg/m   General Appearance:    Alert, cooperative, no distress, appears stated age  Head:    Normocephalic, without obvious abnormality, atraumatic  Eyes:    PERRL, conjunctiva/corneas clear, EOM's intact, fundi    benign, both eyes  Ears:    Normal TM's and external ear canals, both ears  Nose:   Nares normal, septum midline, mucosa normal, no drainage    or sinus tenderness  Throat:   Lips, mucosa, and tongue normal; teeth and gums normal  Neck:   Supple, symmetrical, trachea midline, no adenopathy;    thyroid:  no enlargement/tenderness/nodules; no carotid   bruit or JVD  Back:     Symmetric, no curvature, ROM normal, no CVA tenderness  Lungs:     Clear to auscultation bilaterally, respirations unlabored  Chest Wall:    No tenderness or deformity   Heart:    Regular rate and rhythm, S1 and S2 normal, no murmur, rub   or gallop  Breast Exam:    No tenderness, masses, or nipple abnormality  Abdomen:     Soft, non-tender, bowel sounds active all four quadrants,    no masses, no  organomegaly  Genitalia:    Deferred.  Rectal:    Deferred.  Extremities:   Extremities normal, atraumatic, no cyanosis or edema  Pulses:   2+ and symmetric all extremities  Skin:   Skin color, texture, turgor normal, no rashes or lesions  Lymph nodes:   Cervical, supraclavicular, and axillary nodes normal  Neurologic:   CNII-XII intact, normal strength, sensation and reflexes    throughout                                  Foot:   Diabetic foot exam performed bilaterally. Neuro/sensory exam performed.  Bilateral DP pulses present.   Advanced Directives: Does Patient Have a Medical Advance Directive?: No Would patient like information on creating a medical advance directive?: No - Patient declined    Assessment:    This is a routine wellness examination for this patient .   Vision/Hearing screen  Hearing Screening   125Hz  250Hz  500Hz  1000Hz  2000Hz  3000Hz  4000Hz  6000Hz  8000Hz   Right ear: 25  25 25 25   0    Left ear: 25  25 25 25   0      Visual Acuity Screening   Right eye Left eye Both eyes  Without correction:     With correction: 20/30 20/30 20/30     Dietary issues and exercise activities discussed:  Current Exercise Habits: The patient does not participate in regular exercise at present  Goals    . DIET - DECREASE SODA OR JUICE INTAKE    . DIET - REDUCE FAST FOOD INTAKE      Depression Screen PHQ 2/9 Scores 02/05/2019 11/08/2018  PHQ - 2 Score 0 0     Fall Risk Fall Risk  02/05/2019  Falls in the past year? 0    Cognitive Function:     6CIT Screen 02/05/2019  What Year? 0 points  What month? 0 points  What time? 0 points  Count back from 20 0 points  Months in reverse 0 points  Repeat phrase 0 points  Total Score 0    Patient Care Team: [name], MD as PCP - General (Internal Medicine)     Plan:   1. Medicare annual wellness visit, initial  A full exam was performed.  Importance of monthly self  breast exams was discussed with the patient. THE ANNUAL WELLNESS VISIT WAS PERFORMED INCLUDING DISCUSSION OF ADVANCED DIRECTIVES AND ASSESSMENT OF COGNITIVE FUNCTION.  PATIENT HAS BEEN ADVISED TO GET 30-45 MINUTES REGULAR EXERCISE NO LESS THAN FOUR TO FIVE DAYS PER WEEK - BOTH WEIGHTBEARING EXERCISES AND AEROBIC ARE RECOMMENDED.  SHE IS ADVISED TO FOLLOW A HEALTHY DIET WITH AT LEAST SIX FRUITS/VEGGIES PER DAY, DECREASE INTAKE OF RED MEAT, AND TO INCREASE FISH INTAKE TO TWO DAYS PER WEEK.  MEATS/FISH SHOULD NOT BE FRIED, BAKED OR BROILED IS PREFERABLE.  I SUGGEST WEARING SPF 50 SUNSCREEN ON EXPOSED PARTS AND ESPECIALLY WHEN IN THE DIRECT SUNLIGHT FOR AN EXTENDED PERIOD OF TIME.  PLEASE AVOID FAST FOOD RESTAURANTS AND INCREASE YOUR WATER INTAKE. .   2. Type 2 diabetes mellitus with stage 3 chronic kidney disease, without long-term current use of insulin ([place])  Diabetic foot exam was performed.  I DISCUSSED WITH THE PATIENT AT LENGTH REGARDING THE GOALS OF GLYCEMIC CONTROL AND POSSIBLE LONG-TERM COMPLICATIONS.  I  ALSO STRESSED THE IMPORTANCE OF COMPLIANCE WITH HOME GLUCOSE MONITORING, DIETARY RESTRICTIONS INCLUDING AVOIDANCE OF SUGARY DRINKS/PROCESSED FOODS,  ALONG WITH REGULAR EXERCISE.  I  ALSO STRESSED THE IMPORTANCE OF ANNUAL EYE EXAMS, SELF FOOT CARE AND COMPLIANCE WITH OFFICE VISITS.  - CMP14+EGFR - CBC - Lipid panel - Hemoglobin A1c - POCT Urinalysis Dipstick (81002) - POCT UA - Microalbumin  3. Hypertensive nephropathy  Fair control. She will continue with current meds for now. She is encouraged to avoid adding salt to her foods. Importance of regular exercise was discussed with the patient.  EKG performed, no acute changes noted.   - EKG 12-Lead  4. Pure hypercholesterolemia  SHE IS ENCOURAGED TO EXERCISE FIVE DAYS WEEKLY FOR AT LEAST 30 MINUTES, AVOID FRIED FOODS, EAT 25-35 GRAMS OF FIBER, AND TO EAT FISH  AT LEAST TWICE WEEKLY.  5. Overweight (BMI 25.0-29.9)  Her BMI is 28, pt  encouraged to strive for BMI less than 26 to decrease cardiac risk. She is encouraged to exercise no less than 150 minutes per week. She is also encouraged to avoid sugary beverages and processed foods.   6. Postnasal drip  She was given samples of Xyzal 90m to use nightly as needed.  She is encouraged to avoid dairy products when she is symptomatic.   7. Need for vaccination  Her immunization history was updated. She was given Pneumovax-23 Im x 1.    - Pneumococcal polysaccharide vaccine 23-valent greater than or equal to 2yo subcutaneous/IM    I have personally reviewed and noted the following in the patient's chart:   . Medical and social history . Use of alcohol, tobacco or illicit drugs  . Current medications and supplements . Functional ability and status . Nutritional status . Physical activity . Advanced directives . List of other physicians . Hospitalizations, surgeries, and ER visits in previous 12 months . Vitals . Screenings to include cognitive, depression, and falls . Referrals and appointments  In addition, I have reviewed and discussed with patient certain preventive protocols, quality metrics, and best practice recommendations. A written personalized care plan for preventive services as well as general preventive health recommendations were provided to patient.     RMaximino Greenland MD 02/05/2019      Subjective:    Michele BAKSHis a 68y.o. female who presents for a Welcome to Medicare exam.   Review of Systems *** Cardiac Risk Factors include: hypertension      Objective:    Today's Vitals   02/05/19 1119 02/05/19 1146  BP: (!) 132/94   Pulse: 83   Temp: 98 F (36.7 C)   TempSrc: Oral   Weight: 166 lb 12.8 oz (75.7 kg)   Height: 5' 3.8" (1.621 m)   PainSc: 0-No pain 0-No pain  Body mass index is 28.81 kg/m.  Medications Outpatient Encounter Medications as of 02/05/2019  Medication Sig  . Olmesartan-amLODIPine-HCTZ (TRIBENZOR) 40-5-25  MG TABS Take 1 tablet by mouth daily.  . Omega-3 Fatty Acids (FISH OIL PO) Take by mouth.  . sitaGLIPtin-metformin (JANUMET) 50-500 MG tablet Take 1 tablet by mouth 2 (two) times daily with a meal.  . [DISCONTINUED] pravastatin (PRAVACHOL) 40 MG tablet One tab po three times weekly/MWF  . Vitamin D, Cholecalciferol, 25 MCG (1000 UT) CAPS Take by mouth.   No facility-administered encounter medications on file as of 02/05/2019.      History: Past Medical History:  Diagnosis Date  . [place]    Type II  . Hyperlipidemia   . Hypertension   . UTI (urinary tract infection)    Past Surgical History:  Procedure Laterality Date  . CESAREAN SECTION     x2    Family History  Problem Relation Age of Onset  . Aneurysm Mother   . Healthy Father    Social History   Occupational History  . Not on file  Tobacco Use  . Smoking status: Never Smoker  . Smokeless tobacco: Never Used  Substance and Sexual Activity  . Alcohol use: No  . Drug use: No  . Sexual activity: Not on file    Tobacco Counseling Counseling given: Not Answered   Immunizations and Health Maintenance Immunization History  Administered Date(s) Administered  . DTaP 08/03/2007, 10/14/2014  . Pneumococcal Conjugate-13 03/03/2014  . Pneumococcal Polysaccharide-23 01/30/2016,  02/05/2019   There are no preventive care reminders to display for this patient.  Activities of Daily Living In your present state of health, do you have any difficulty performing the following activities: 02/05/2019  Hearing? N  Vision? Y  Difficulty concentrating or making decisions? N  Walking or climbing stairs? N  Dressing or bathing? N  Doing errands, shopping? N  Preparing Food and eating ? N  Using the Toilet? N  In the past six months, have you accidently leaked urine? Y  Do you have problems with loss of bowel control? N  Managing your Medications? N  Managing your Finances? N  Housekeeping  or managing your Housekeeping? N  Some recent data might be hidden    Physical Exam  ***(optional), or other factors deemed appropriate based on the beneficiary's medical and social history and current clinical standards.  Advanced Directives: Does Patient Have a Medical Advance Directive?: No Would patient like information on creating a medical advance directive?: No - Patient declined    Assessment:    This is a routine wellness examination for this patient . ***  Vision/Hearing screen  Hearing Screening   125Hz  250Hz  500Hz  1000Hz  2000Hz  3000Hz  4000Hz  6000Hz  8000Hz   Right ear: 25  25 25 25   0    Left ear: 25  25 25 25   0      Visual Acuity Screening   Right eye Left eye Both eyes  Without correction:     With correction: 20/30 20/30 20/30     Dietary issues and exercise activities discussed:  Current Exercise Habits: The patient does not participate in regular exercise at present  Goals    . DIET - DECREASE SODA OR JUICE INTAKE    . DIET - REDUCE FAST FOOD INTAKE      Depression Screen PHQ 2/9 Scores 02/05/2019 11/08/2018  PHQ - 2 Score 0 0     Fall Risk Fall Risk  02/05/2019  Falls in the past year? 0    Cognitive Function:     6CIT Screen 02/05/2019  What Year? 0 points  What month? 0 points  What time? 0 points  Count back from 20 0 points  Months in reverse 0 points  Repeat phrase 0 points  Total Score 0    Patient Care Team: [name], MD as PCP - General (Internal Medicine)     Plan:   ***  I have personally reviewed and noted the following in the patient's chart:   . Medical and social history . Use of alcohol, tobacco or illicit drugs  . Current medications and supplements . Functional ability and status . Nutritional status . Physical activity . Advanced directives . List of other physicians . Hospitalizations, surgeries, and ER visits in previous 12 months . Vitals . Screenings to include cognitive, depression, and  falls . Referrals and appointments  In addition, I have reviewed and discussed with patient certain preventive protocols, quality metrics, and best practice recommendations. A written personalized care plan for preventive services as well as general preventive health recommendations were provided to patient.     [name], MD 03/09/2019

## 2019-02-05 NOTE — Patient Instructions (Addendum)
  Ms. Rotenberg , Thank you for taking time to come for your Medicare Wellness Visit. I appreciate your ongoing commitment to your health goals. Please review the following plan we discussed and let me know if I can assist you in the future.   These are the goals we discussed: Goals    . DIET - DECREASE SODA OR JUICE INTAKE    . DIET - REDUCE FAST FOOD INTAKE       This is a list of the screening recommended for you and due dates:  Health Maintenance  Topic Date Due  . Flu Shot  02/05/2019*  . Eye exam for diabetics  03/22/2019  . Hemoglobin A1C  05/10/2019  . Complete foot exam   02/05/2020  . Mammogram  06/13/2020  . Colon Cancer Screening  07/21/2024  . Tetanus Vaccine  10/14/2024  . DEXA scan (bone density measurement)  Completed  .  Hepatitis C: One time screening is recommended by Center for Disease Control  (CDC) for  adults born from [birth year] through 1965.   Completed  . Pneumonia vaccines  Completed  *Topic was postponed. The date shown is not the original due date.

## 2019-02-06 LAB — CBC
Hematocrit: 41.3 % (ref 34.0–46.6)
Hemoglobin: 13.5 g/dL (ref 11.1–15.9)
MCH: 29.4 pg (ref 26.6–33.0)
MCHC: 32.7 g/dL (ref 31.5–35.7)
MCV: 90 fL (ref 79–97)
Platelets: 252 10*3/uL (ref 150–450)
RBC: 4.59 x10E6/uL (ref 3.77–5.28)
RDW: 13.6 % (ref 11.7–15.4)
WBC: 6.5 10*3/uL (ref 3.4–10.8)

## 2019-02-06 LAB — CMP14+EGFR
ALT: 49 IU/L — ABNORMAL HIGH (ref 0–32)
AST: 39 IU/L (ref 0–40)
Albumin/Globulin Ratio: 1.5 (ref 1.2–2.2)
Albumin: 4.6 g/dL (ref 3.8–4.8)
Alkaline Phosphatase: 81 IU/L (ref 39–117)
BUN/Creatinine Ratio: 14 (ref 12–28)
BUN: 17 mg/dL (ref 8–27)
Bilirubin Total: 0.3 mg/dL (ref 0.0–1.2)
CO2: 25 mmol/L (ref 20–29)
Calcium: 10.6 mg/dL — ABNORMAL HIGH (ref 8.7–10.3)
Chloride: 99 mmol/L (ref 96–106)
Creatinine, Ser: 1.22 mg/dL — ABNORMAL HIGH (ref 0.57–1.00)
GFR calc Af Amer: 53 mL/min/{1.73_m2} — ABNORMAL LOW (ref >59)
GFR calc non Af Amer: 46 mL/min/{1.73_m2} — ABNORMAL LOW (ref >59)
Globulin, Total: 3.1 g/dL (ref 1.5–4.5)
Glucose: 121 mg/dL — ABNORMAL HIGH (ref 65–99)
Potassium: 3.6 mmol/L (ref 3.5–5.2)
Sodium: 141 mmol/L (ref 134–144)
Total Protein: 7.7 g/dL (ref 6.0–8.5)

## 2019-02-06 LAB — LIPID PANEL
Chol/HDL Ratio: 4.8 ratio — ABNORMAL HIGH (ref 0.0–4.4)
Cholesterol, Total: 266 mg/dL — ABNORMAL HIGH (ref 100–199)
HDL: 56 mg/dL (ref >39)
LDL Calculated: 175 mg/dL — ABNORMAL HIGH (ref 0–99)
Triglycerides: 173 mg/dL — ABNORMAL HIGH (ref 0–149)
VLDL Cholesterol Cal: 35 mg/dL (ref 5–40)

## 2019-02-06 LAB — HEMOGLOBIN A1C
Est. average glucose Bld gHb Est-mCnc: 163 mg/dL
Hgb A1c MFr Bld: 7.3 % — ABNORMAL HIGH (ref 4.8–5.6)

## 2019-02-12 ENCOUNTER — Other Ambulatory Visit: Payer: Medicare Other

## 2019-02-12 ENCOUNTER — Other Ambulatory Visit: Payer: Self-pay

## 2019-02-12 DIAGNOSIS — N183 Chronic kidney disease, stage 3 unspecified: Secondary | ICD-10-CM

## 2019-02-12 MED ORDER — PRAVASTATIN SODIUM 40 MG PO TABS: 40.0000 mg | ORAL_TABLET | Freq: Every day | ORAL | 1 refills | Status: DC

## 2019-02-14 LAB — PROTEIN ELECTROPHORESIS, SERUM, WITH REFLEX
A/G Ratio: 1.1 (ref 0.7–1.7)
Albumin ELP: 3.9 g/dL (ref 2.9–4.4)
Alpha 1: 0.2 g/dL (ref 0.0–0.4)
Alpha 2: 0.7 g/dL (ref 0.4–1.0)
Beta: 1.3 g/dL (ref 0.7–1.3)
Gamma Globulin: 1.2 g/dL (ref 0.4–1.8)
Globulin, Total: 3.4 g/dL (ref 2.2–3.9)
Total Protein: 7.3 g/dL (ref 6.0–8.5)

## 2019-02-14 LAB — PHOSPHORUS: Phosphorus: 3.6 mg/dL (ref 3.0–4.3)

## 2019-04-19 ENCOUNTER — Other Ambulatory Visit: Payer: Self-pay

## 2019-04-19 MED ORDER — SITAGLIPTIN PHOS-METFORMIN HCL 50-500 MG PO TABS: 1.0000 | ORAL_TABLET | Freq: Two times a day (BID) | ORAL | 1 refills | Status: DC

## 2019-04-24 ENCOUNTER — Telehealth: Payer: Self-pay

## 2019-04-24 ENCOUNTER — Other Ambulatory Visit: Payer: Self-pay

## 2019-04-24 MED ORDER — SITAGLIPTIN PHOS-METFORMIN HCL 50-500 MG PO TABS: 1.0000 | ORAL_TABLET | Freq: Two times a day (BID) | ORAL | 2 refills | Status: DC

## 2019-04-24 MED ORDER — OLMESARTAN-AMLODIPINE-HCTZ 40-5-25 MG PO TABS: 1.0000 | ORAL_TABLET | Freq: Every day | ORAL | 2 refills | Status: DC

## 2019-04-24 NOTE — Telephone Encounter (Signed)
I left the pt a message that I was calling to clarify if she wanted the olmesartan and Janumet to go to a mail order company called [facility] mail.

## 2019-05-06 DIAGNOSIS — E113312 Type 2 diabetes mellitus with moderate nonproliferative diabetic retinopathy with macular edema, left eye: Secondary | ICD-10-CM | POA: Diagnosis not present

## 2019-05-06 DIAGNOSIS — E113311 Type 2 diabetes mellitus with moderate nonproliferative diabetic retinopathy with macular edema, right eye: Secondary | ICD-10-CM | POA: Diagnosis not present

## 2019-05-06 DIAGNOSIS — H3561 Retinal hemorrhage, right eye: Secondary | ICD-10-CM | POA: Diagnosis not present

## 2019-05-06 DIAGNOSIS — H3562 Retinal hemorrhage, left eye: Secondary | ICD-10-CM | POA: Diagnosis not present

## 2019-05-07 ENCOUNTER — Ambulatory Visit (INDEPENDENT_AMBULATORY_CARE_PROVIDER_SITE_OTHER): Payer: Medicare Other | Admitting: Internal Medicine

## 2019-05-07 ENCOUNTER — Encounter: Payer: Self-pay | Admitting: Internal Medicine

## 2019-05-07 ENCOUNTER — Other Ambulatory Visit: Payer: Self-pay

## 2019-05-07 VITALS — BP 136/90 | HR 63 | Temp 98.2°F | Ht 64.4 in | Wt 165.4 lb

## 2019-05-07 DIAGNOSIS — I129 Hypertensive chronic kidney disease with stage 1 through stage 4 chronic kidney disease, or unspecified chronic kidney disease: Secondary | ICD-10-CM | POA: Diagnosis not present

## 2019-05-07 DIAGNOSIS — N183 Chronic kidney disease, stage 3 unspecified: Secondary | ICD-10-CM

## 2019-05-07 DIAGNOSIS — E7800 Pure hypercholesterolemia, unspecified: Secondary | ICD-10-CM | POA: Diagnosis not present

## 2019-05-07 DIAGNOSIS — E1122 Type 2 diabetes mellitus with diabetic chronic kidney disease: Secondary | ICD-10-CM

## 2019-05-07 DIAGNOSIS — Z6828 Body mass index (BMI) 28.0-28.9, adult: Secondary | ICD-10-CM

## 2019-05-07 MED ORDER — FREESTYLE LIBRE 14 DAY SENSOR MISC: 1.0000 | Freq: Two times a day (BID) | 1 refills | Status: DC

## 2019-05-07 MED ORDER — FREESTYLE LIBRE 14 DAY READER DEVI: 1.0000 | Freq: Two times a day (BID) | 1 refills | Status: DC

## 2019-05-07 NOTE — Patient Instructions (Signed)
Diabetes Mellitus and Exercise Exercising regularly is important for your overall health, especially when you have diabetes (diabetes mellitus). Exercising is not only about losing weight. It has many other health benefits, such as increasing muscle strength and bone density and reducing body fat and stress. This leads to improved fitness, flexibility, and endurance, all of which result in better overall health. Exercise has additional benefits for people with diabetes, including:  Reducing appetite.  Helping to lower and control blood glucose.  Lowering blood pressure.  Helping to control amounts of fatty substances (lipids) in the blood, such as cholesterol and triglycerides.  Helping the body to respond better to insulin (improving insulin sensitivity).  Reducing how much insulin the body needs.  Decreasing the risk for heart disease by: ? Lowering cholesterol and triglyceride levels. ? Increasing the levels of good cholesterol. ? Lowering blood glucose levels. What is my activity plan? Your health care provider or certified diabetes educator can help you make a plan for the type and frequency of exercise (activity plan) that works for you. Make sure that you:  Do at least 150 minutes of moderate-intensity or vigorous-intensity exercise each week. This could be brisk walking, biking, or water aerobics. ? Do stretching and strength exercises, such as yoga or weightlifting, at least 2 times a week. ? Spread out your activity over at least 3 days of the week.  Get some form of physical activity every day. ? Do not go more than 2 days in a row without some kind of physical activity. ? Avoid being inactive for more than 30 minutes at a time. Take frequent breaks to walk or stretch.  Choose a type of exercise or activity that you enjoy, and set realistic goals.  Start slowly, and gradually increase the intensity of your exercise over time. What do I need to know about managing my  diabetes?   Check your blood glucose before and after exercising. ? If your blood glucose is 240 mg/dL (13.3 mmol/L) or higher before you exercise, check your urine for ketones. If you have ketones in your urine, do not exercise until your blood glucose returns to normal. ? If your blood glucose is 100 mg/dL (5.6 mmol/L) or lower, eat a snack containing 15-20 grams of carbohydrate. Check your blood glucose 15 minutes after the snack to make sure that your level is above 100 mg/dL (5.6 mmol/L) before you start your exercise.  Know the symptoms of low blood glucose (hypoglycemia) and how to treat it. Your risk for hypoglycemia increases during and after exercise. Common symptoms of hypoglycemia can include: ? Hunger. ? Anxiety. ? Sweating and feeling clammy. ? Confusion. ? Dizziness or feeling light-headed. ? Increased heart rate or palpitations. ? Blurry vision. ? Tingling or numbness around the mouth, lips, or tongue. ? Tremors or shakes. ? Irritability.  Keep a rapid-acting carbohydrate snack available before, during, and after exercise to help prevent or treat hypoglycemia.  Avoid injecting insulin into areas of the body that are going to be exercised. For example, avoid injecting insulin into: ? The arms, when playing tennis. ? The legs, when jogging.  Keep records of your exercise habits. Doing this can help you and your health care provider adjust your diabetes management plan as needed. Write down: ? Food that you eat before and after you exercise. ? Blood glucose levels before and after you exercise. ? The type and amount of exercise you have done. ? When your insulin is expected to peak, if you use   insulin. Avoid exercising at times when your insulin is peaking.  When you start a new exercise or activity, work with your health care provider to make sure the activity is safe for you, and to adjust your insulin, medicines, or food intake as needed.  Drink plenty of water while  you exercise to prevent dehydration or heat stroke. Drink enough fluid to keep your urine clear or pale yellow. Summary  Exercising regularly is important for your overall health, especially when you have diabetes (diabetes mellitus).  Exercising has many health benefits, such as increasing muscle strength and bone density and reducing body fat and stress.  Your health care provider or certified diabetes educator can help you make a plan for the type and frequency of exercise (activity plan) that works for you.  When you start a new exercise or activity, work with your health care provider to make sure the activity is safe for you, and to adjust your insulin, medicines, or food intake as needed. This information is not intended to replace advice given to you by your health care provider. Make sure you discuss any questions you have with your health care provider. Document Released: 02/04/2004 Document Revised: 05/25/2017 Document Reviewed: 04/25/2016 [employer]vier Interactive Patient Education  2019 [employer]  

## 2019-05-07 NOTE — Progress Notes (Signed)
Subjective:     Patient ID: Michele Wang , female    DOB: 01-14-1951 , 68 y.o.   MRN: [id number]   Chief Complaint  Patient presents with  . Diabetes    125 blood sugar  . Hypertension    HPI  Diabetes She presents for her follow-up diabetic visit. She has type 2 diabetes mellitus. Her disease course has been stable. There are no hypoglycemic associated symptoms. Pertinent negatives for hypoglycemia include no headaches. There are no diabetic associated symptoms. Pertinent negatives for diabetes include no blurred vision and no chest pain. There are no hypoglycemic complications. Symptoms are stable. Diabetic complications include nephropathy. Risk factors for coronary artery disease include diabetes mellitus, dyslipidemia, hypertension, obesity, post-menopausal and sedentary lifestyle. She is compliant with treatment most of the time. She is following a diabetic diet. She participates in exercise intermittently. Her breakfast blood glucose is taken between 8-9 am. Her breakfast blood glucose range is generally 110-130 mg/dl. An ACE inhibitor/angiotensin II receptor blocker is being taken.  Hypertension This is a chronic problem. The current episode started more than 1 year ago. The problem has been gradually improving since onset. The problem is controlled. Pertinent negatives include no blurred vision, chest pain, headaches or shortness of breath. Past treatments include angiotensin blockers, diuretics and ACE inhibitors. Compliance problems include exercise.      Past Medical History:  Diagnosis Date  . Diabetes mellitus without complication (Michele Wang)    Type II  . Hyperlipidemia   . Hypertension   . UTI (urinary tract infection)      Family History  Problem Relation Age of Onset  . Aneurysm Mother   . Healthy Father      Current Outpatient Medications:  .  Ascorbic Acid (VITAMIN C) 1000 MG tablet, Take 1,000 mg by mouth daily., Disp: , Rfl:  .  Olmesartan-amLODIPine-HCTZ  (TRIBENZOR) 40-5-25 MG TABS, Take 1 tablet by mouth daily., Disp: 90 tablet, Rfl: 2 .  sitaGLIPtin-metformin (JANUMET) 50-500 MG tablet, Take 1 tablet by mouth 2 (two) times daily with a meal., Disp: 180 tablet, Rfl: 2 .  Vitamin D, Cholecalciferol, 25 MCG (1000 UT) CAPS, Take by mouth., Disp: , Rfl:  .  Continuous Blood Gluc Receiver (FREESTYLE LIBRE 14 DAY READER) DEVI, 1 each by Does not apply route 2 (two) times a day. Dx: e11.22, Disp: 1 Device, Rfl: 1 .  Continuous Blood Gluc Sensor (FREESTYLE LIBRE 14 DAY SENSOR) MISC, Inject 1 each into the skin 2 (two) times a day. Ex: e11.22, Disp: 9 each, Rfl: 1 .  Omega-3 Fatty Acids (FISH OIL PO), Take by mouth., Disp: , Rfl:    No Known Allergies   Review of Systems  Constitutional: Negative.   Eyes: Negative for blurred vision.  Respiratory: Negative.  Negative for shortness of breath.   Cardiovascular: Negative.  Negative for chest pain.  Gastrointestinal: Negative.   Neurological: Negative.  Negative for headaches.  Psychiatric/Behavioral: Negative.      Today's Vitals   05/07/19 1018  BP: 136/90  Pulse: 63  Temp: 98.2 F (36.8 C)  TempSrc: Oral  Weight: 165 lb 6.4 oz (75 kg)  Height: 5' 4.4" (1.636 m)  PainSc: 0-No pain   Body mass index is 28.04 kg/m.   Objective:  Physical Exam Vitals signs and nursing note reviewed.  Constitutional:      Appearance: Normal appearance.  HENT:     Head: Normocephalic and atraumatic.  Cardiovascular:     Rate and Rhythm: Normal rate and  regular rhythm.     Heart sounds: Normal heart sounds.  Pulmonary:     Effort: Pulmonary effort is normal.     Breath sounds: Normal breath sounds.  Skin:    General: Skin is warm.  Neurological:     General: No focal deficit present.     Mental Status: She is alert.  Psychiatric:        Mood and Affect: Mood normal.        Behavior: Behavior normal.         Assessment And Plan:     1. Type 2 diabetes mellitus with stage 3 chronic kidney  disease, without long-term current use of insulin ([name])  I will check labs as listed below. Importance of medication AND dietary AND exercise compliance was discussed with the patient.   - Hemoglobin A1c - BMP8+EGFR - TSH  2. Hypertensive nephropathy  Chronic. Fair control. Pt is aware of ideal bp less than 130/80. She is encouraged to avoid adding salt to her foods, and to avoid packaged foods which tend to be high in sodium. She is encouraged to strive for 150 minutes of exercise per week.   3. Pure hypercholesterolemia  She is encouraged to avoid fried foods, exercise regularly and to take a fiber supplement.       4. BMI 28/overweight  Importance of achieving optimal weight to decrease risk of cardiovascular disease and cancers was discussed with the patient in full detail. She is encouraged to start slowly - start with 10 minutes twice daily at least three to four days per week and to gradually build to 30 minutes five days weekly. She was given tips to incorporate more activity into her daily routine - take stairs when possible, park farther away from her job, grocery stores, etc.    Michele Greenland, MD    THE PATIENT IS ENCOURAGED TO PRACTICE SOCIAL DISTANCING DUE TO THE COVID-19 PANDEMIC.

## 2019-05-08 ENCOUNTER — Telehealth: Payer: Self-pay

## 2019-05-08 LAB — BMP8+EGFR
BUN/Creatinine Ratio: 15 (ref 12–28)
BUN: 17 mg/dL (ref 8–27)
CO2: 26 mmol/L (ref 20–29)
Calcium: 10.8 mg/dL — ABNORMAL HIGH (ref 8.7–10.3)
Chloride: 100 mmol/L (ref 96–106)
Creatinine, Ser: 1.1 mg/dL — ABNORMAL HIGH (ref 0.57–1.00)
GFR calc Af Amer: 60 mL/min/{1.73_m2} (ref >59)
GFR calc non Af Amer: 52 mL/min/{1.73_m2} — ABNORMAL LOW (ref >59)
Glucose: 110 mg/dL — ABNORMAL HIGH (ref 65–99)
Potassium: 4.2 mmol/L (ref 3.5–5.2)
Sodium: 140 mmol/L (ref 134–144)

## 2019-05-08 LAB — HEMOGLOBIN A1C
Est. average glucose Bld gHb Est-mCnc: 163 mg/dL
Hgb A1c MFr Bld: 7.3 % — ABNORMAL HIGH (ref 4.8–5.6)

## 2019-05-08 LAB — TSH: TSH: 1.15 u[IU]/mL (ref 0.450–4.500)

## 2019-05-08 NOTE — Telephone Encounter (Signed)
-----   Message from [name], MD sent at 05/08/2019  7:31 AM EDT ----- Here are your lab results:  Your hba1c is 7.3, this is stable.  Your kidney function has improved some, be sure to stay well hydrated. Your calcium level is elevated. I would like for you to come in for additional bloodwork. We need to check your PTH, intact - this is parathyroid hormone. This needs to be evaluated because of the elevated calcium levels.   Please let me know if you have any questions. Your thyroid function is normal.   Sincerely,    Robyn N. Baird Cancer, MD

## 2019-05-08 NOTE — Telephone Encounter (Signed)
Left the pt a message to call the office back so that I can schedule her for additional lab work.

## 2019-05-13 ENCOUNTER — Other Ambulatory Visit: Payer: Self-pay

## 2019-05-13 ENCOUNTER — Other Ambulatory Visit: Payer: Medicare Other

## 2019-05-14 LAB — PTH, INTACT AND CALCIUM
Calcium: 10 mg/dL (ref 8.7–10.3)
PTH: 27 pg/mL (ref 15–65)

## 2019-05-15 ENCOUNTER — Other Ambulatory Visit: Payer: Self-pay

## 2019-05-15 DIAGNOSIS — N183 Chronic kidney disease, stage 3 unspecified: Secondary | ICD-10-CM

## 2019-05-15 DIAGNOSIS — E1122 Type 2 diabetes mellitus with diabetic chronic kidney disease: Secondary | ICD-10-CM

## 2019-05-15 MED ORDER — FREESTYLE LIBRE 14 DAY SENSOR MISC: 1.0000 | Freq: Two times a day (BID) | 1 refills | Status: DC

## 2019-05-15 MED ORDER — FREESTYLE LIBRE 14 DAY READER DEVI: 1.0000 | Freq: Two times a day (BID) | 1 refills | Status: DC

## 2019-06-10 DIAGNOSIS — H40033 Anatomical narrow angle, bilateral: Secondary | ICD-10-CM | POA: Diagnosis not present

## 2019-06-10 DIAGNOSIS — H40013 Open angle with borderline findings, low risk, bilateral: Secondary | ICD-10-CM | POA: Diagnosis not present

## 2019-06-10 DIAGNOSIS — E119 Type 2 diabetes mellitus without complications: Secondary | ICD-10-CM | POA: Diagnosis not present

## 2019-06-10 DIAGNOSIS — H2513 Age-related nuclear cataract, bilateral: Secondary | ICD-10-CM | POA: Diagnosis not present

## 2019-06-10 LAB — HM DIABETES EYE EXAM

## 2019-06-14 ENCOUNTER — Encounter: Payer: Self-pay | Admitting: Internal Medicine

## 2019-06-18 ENCOUNTER — Telehealth: Payer: Self-pay

## 2019-06-18 NOTE — Telephone Encounter (Signed)
I left the pt a message that I was returning her call to schedule her an appt for evaluation of the peeling skin that she left a message about.

## 2019-06-21 DIAGNOSIS — Z1231 Encounter for screening mammogram for malignant neoplasm of breast: Secondary | ICD-10-CM | POA: Diagnosis not present

## 2019-06-21 LAB — HM MAMMOGRAPHY: HM Mammogram: ABNORMAL — AB (ref 0–4)

## 2019-07-01 DIAGNOSIS — R921 Mammographic calcification found on diagnostic imaging of breast: Secondary | ICD-10-CM | POA: Diagnosis not present

## 2019-07-01 DIAGNOSIS — R928 Other abnormal and inconclusive findings on diagnostic imaging of breast: Secondary | ICD-10-CM | POA: Diagnosis not present

## 2019-07-02 ENCOUNTER — Encounter: Payer: Self-pay | Admitting: Internal Medicine

## 2019-07-02 ENCOUNTER — Other Ambulatory Visit: Payer: Self-pay

## 2019-07-02 ENCOUNTER — Ambulatory Visit (INDEPENDENT_AMBULATORY_CARE_PROVIDER_SITE_OTHER): Payer: Medicare Other | Admitting: Internal Medicine

## 2019-07-02 VITALS — BP 116/78 | HR 74 | Temp 97.8°F | Ht 64.4 in | Wt 156.2 lb

## 2019-07-02 DIAGNOSIS — R350 Frequency of micturition: Secondary | ICD-10-CM

## 2019-07-02 DIAGNOSIS — R630 Anorexia: Secondary | ICD-10-CM

## 2019-07-02 DIAGNOSIS — R5383 Other fatigue: Secondary | ICD-10-CM

## 2019-07-02 DIAGNOSIS — Z712 Person consulting for explanation of examination or test findings: Secondary | ICD-10-CM | POA: Diagnosis not present

## 2019-07-02 DIAGNOSIS — N289 Disorder of kidney and ureter, unspecified: Secondary | ICD-10-CM | POA: Diagnosis not present

## 2019-07-02 DIAGNOSIS — R197 Diarrhea, unspecified: Secondary | ICD-10-CM | POA: Diagnosis not present

## 2019-07-02 DIAGNOSIS — R928 Other abnormal and inconclusive findings on diagnostic imaging of breast: Secondary | ICD-10-CM | POA: Diagnosis not present

## 2019-07-02 DIAGNOSIS — R634 Abnormal weight loss: Secondary | ICD-10-CM | POA: Diagnosis not present

## 2019-07-02 LAB — POCT URINALYSIS DIPSTICK
Bilirubin, UA: NEGATIVE
Glucose, UA: NEGATIVE
Ketones, UA: NEGATIVE
Nitrite, UA: NEGATIVE
Protein, UA: NEGATIVE
Spec Grav, UA: 1.02 (ref 1.010–1.025)
Urobilinogen, UA: 0.2 E.U./dL
pH, UA: 5 (ref 5.0–8.0)

## 2019-07-02 NOTE — Patient Instructions (Addendum)

## 2019-07-02 NOTE — Progress Notes (Signed)
Subjective:     Patient ID: Michele Wang , female    DOB: [birth year] , 68 y.o.   MRN: [id number]   Chief Complaint  Patient presents with  . Fatigue    HPI  She is here today for further evaluation of worsening fatigue. She is not sure what is contributing to her symptoms. She admits that she has lost her appetite. She denies nausea/vomiting. However, she has been having diarrhea. She denies ill contacts. She denies eating at new restaurants. She denies change in her sleeping habits. She denies fever/chills. She has no known exposure to anyone with COVID.     Past Medical History:  Diagnosis Date  . Diabetes mellitus without complication ([place])    Type II  . Hyperlipidemia   . Hypertension   . UTI (urinary tract infection)      Family History  Problem Relation Age of Onset  . Aneurysm Mother   . Healthy Father      Current Outpatient Medications:  .  Ascorbic Acid (VITAMIN C) 1000 MG tablet, Take 1,000 mg by mouth daily., Disp: , Rfl:  .  CALCIUM PO, Take 1,000 mg by mouth daily., Disp: , Rfl:  .  Olmesartan-amLODIPine-HCTZ (TRIBENZOR) 40-5-25 MG TABS, Take 1 tablet by mouth daily., Disp: 90 tablet, Rfl: 2 .  sitaGLIPtin-metformin (JANUMET) 50-500 MG tablet, Take 1 tablet by mouth 2 (two) times daily with a meal., Disp: 180 tablet, Rfl: 2 .  Vitamin D, Cholecalciferol, 25 MCG (1000 UT) CAPS, Take by mouth., Disp: , Rfl:    No Known Allergies   Review of Systems  Constitutional: Negative.   Respiratory: Negative.   Cardiovascular: Negative.   Gastrointestinal: Positive for diarrhea.  Genitourinary: Positive for frequency.  Neurological: Negative.   Psychiatric/Behavioral: Negative.      Today's Vitals   07/02/19 0907  BP: 116/78  Pulse: 74  Temp: 97.8 F (36.6 C)  TempSrc: Oral  Weight: 156 lb 3.2 oz (70.9 kg)  Height: 5' 4.4" (1.636 m)  PainSc: 0-No pain   Body mass index is 26.48 kg/m.   Objective:  Physical Exam Vitals signs and nursing note  reviewed.  Constitutional:      Appearance: Normal appearance.  HENT:     Head: Normocephalic and atraumatic.  Cardiovascular:     Rate and Rhythm: Normal rate and regular rhythm.     Heart sounds: Normal heart sounds.  Pulmonary:     Effort: Pulmonary effort is normal.     Breath sounds: Normal breath sounds.  Skin:    General: Skin is warm.  Neurological:     General: No focal deficit present.     Mental Status: She is alert.  Psychiatric:        Mood and Affect: Mood normal.        Behavior: Behavior normal.         Assessment And Plan:     1. Fatigue, unspecified type  I will check blood count, liver and kidney function today.  I will make further recommendations once her labs are available for review.   - CMP14+EGFR  2. Diarrhea, unspecified type  I will check labs as below. She is encouraged to stay well hydrated. She would likely benefit from probiotic therapy.   - Amylase - Lipase - Novel Coronavirus, NAA ([facility]) - CBC with Diff  3. Abnormal mammogram of left breast  Recent mammogram results reviewed. She has biopsy scheduled next week.   4. Decreased appetite  Pt advised that  I will need to review labwork before deciding what the next steps are. She may need to have EGD.  5. Weight loss  Unintentional. I will check urine for glucosuria.   6. Urinary frequency  I will check urinalysis. If abnormal, I will check urine culture and sensitivity.   - POCT Urinalysis Dipstick (81002) - Culture, Urine        [name], MD    THE PATIENT IS ENCOURAGED TO PRACTICE SOCIAL DISTANCING DUE TO THE COVID-19 PANDEMIC.

## 2019-07-03 ENCOUNTER — Encounter: Payer: Self-pay | Admitting: Internal Medicine

## 2019-07-03 ENCOUNTER — Telehealth: Payer: Self-pay

## 2019-07-03 LAB — CBC WITH DIFFERENTIAL/PLATELET
Basophils Absolute: 0.1 10*3/uL (ref 0.0–0.2)
Basos: 2 %
EOS (ABSOLUTE): 0.2 10*3/uL (ref 0.0–0.4)
Eos: 3 %
Hematocrit: 39.4 % (ref 34.0–46.6)
Hemoglobin: 13 g/dL (ref 11.1–15.9)
Immature Grans (Abs): 0 10*3/uL (ref 0.0–0.1)
Immature Granulocytes: 0 %
Lymphocytes Absolute: 3.3 10*3/uL — ABNORMAL HIGH (ref 0.7–3.1)
Lymphs: 54 %
MCH: 29.6 pg (ref 26.6–33.0)
MCHC: 33 g/dL (ref 31.5–35.7)
MCV: 90 fL (ref 79–97)
Monocytes Absolute: 0.4 10*3/uL (ref 0.1–0.9)
Monocytes: 7 %
Neutrophils Absolute: 2.1 10*3/uL (ref 1.4–7.0)
Neutrophils: 34 %
Platelets: 231 10*3/uL (ref 150–450)
RBC: 4.39 x10E6/uL (ref 3.77–5.28)
RDW: 13.1 % (ref 11.7–15.4)
WBC: 6 10*3/uL (ref 3.4–10.8)

## 2019-07-03 LAB — CMP14+EGFR
ALT: 31 IU/L (ref 0–32)
AST: 29 IU/L (ref 0–40)
Albumin/Globulin Ratio: 1.6 (ref 1.2–2.2)
Albumin: 5 g/dL — ABNORMAL HIGH (ref 3.8–4.8)
Alkaline Phosphatase: 62 IU/L (ref 39–117)
BUN/Creatinine Ratio: 15 (ref 12–28)
BUN: 31 mg/dL — ABNORMAL HIGH (ref 8–27)
Bilirubin Total: 0.6 mg/dL (ref 0.0–1.2)
CO2: 24 mmol/L (ref 20–29)
Calcium: 11.1 mg/dL — ABNORMAL HIGH (ref 8.7–10.3)
Chloride: 98 mmol/L (ref 96–106)
Creatinine, Ser: 2.11 mg/dL — ABNORMAL HIGH (ref 0.57–1.00)
GFR calc Af Amer: 27 mL/min/{1.73_m2} — ABNORMAL LOW (ref >59)
GFR calc non Af Amer: 24 mL/min/{1.73_m2} — ABNORMAL LOW (ref >59)
Globulin, Total: 3.1 g/dL (ref 1.5–4.5)
Glucose: 118 mg/dL — ABNORMAL HIGH (ref 65–99)
Potassium: 3.2 mmol/L — ABNORMAL LOW (ref 3.5–5.2)
Sodium: 142 mmol/L (ref 134–144)
Total Protein: 8.1 g/dL (ref 6.0–8.5)

## 2019-07-03 LAB — LIPASE: Lipase: 52 U/L (ref 14–72)

## 2019-07-03 LAB — NOVEL CORONAVIRUS, NAA: SARS-CoV-2, NAA: NOT DETECTED

## 2019-07-03 LAB — AMYLASE: Amylase: 86 U/L (ref 31–110)

## 2019-07-03 NOTE — Telephone Encounter (Signed)
The pt was notified that DR. Baird Cancer said that she needed to stop the Janumet because the pt's kidneys are worsening and for the pt to drink more water and to come for labs to recheck on Friday.

## 2019-07-03 NOTE — Telephone Encounter (Signed)
I left the pt a message to call back.  Dr. Baird Cancer wants the pt to stop the Janumet now and to increase her water intake because the pt has worsening kidney function.  Dr. Baird Cancer also wants the pt to come in Friday for a repeat BMP.

## 2019-07-04 DIAGNOSIS — Z1211 Encounter for screening for malignant neoplasm of colon: Secondary | ICD-10-CM | POA: Diagnosis not present

## 2019-07-04 DIAGNOSIS — R634 Abnormal weight loss: Secondary | ICD-10-CM | POA: Diagnosis not present

## 2019-07-04 DIAGNOSIS — Z86010 Personal history of colonic polyps: Secondary | ICD-10-CM | POA: Diagnosis not present

## 2019-07-04 LAB — URINE CULTURE

## 2019-07-05 ENCOUNTER — Other Ambulatory Visit: Payer: Medicare Other

## 2019-07-05 ENCOUNTER — Telehealth: Payer: Self-pay

## 2019-07-05 ENCOUNTER — Other Ambulatory Visit: Payer: Self-pay | Admitting: Internal Medicine

## 2019-07-05 ENCOUNTER — Other Ambulatory Visit: Payer: Self-pay

## 2019-07-05 DIAGNOSIS — N289 Disorder of kidney and ureter, unspecified: Secondary | ICD-10-CM | POA: Diagnosis not present

## 2019-07-05 LAB — PROTEIN ELECTROPHORESIS
A/G Ratio: 1.1 (ref 0.7–1.7)
Albumin ELP: 4.3 g/dL (ref 2.9–4.4)
Alpha 1: 0.2 g/dL (ref 0.0–0.4)
Alpha 2: 0.8 g/dL (ref 0.4–1.0)
Beta: 1.4 g/dL — ABNORMAL HIGH (ref 0.7–1.3)
Gamma Globulin: 1.6 g/dL (ref 0.4–1.8)
Globulin, Total: 3.9 g/dL (ref 2.2–3.9)
Total Protein: 8.2 g/dL (ref 6.0–8.5)

## 2019-07-05 LAB — SPECIMEN STATUS REPORT

## 2019-07-05 MED ORDER — CEPHALEXIN 500 MG PO CAPS: 500.0000 mg | ORAL_CAPSULE | Freq: Two times a day (BID) | ORAL | 0 refills | Status: DC

## 2019-07-05 NOTE — Telephone Encounter (Signed)
-----   Message from [name], MD sent at 07/05/2019 10:39 AM EDT ----- You have a urinary tract infection. This may be contributing to why you do not feel well.  I have sent an antibiotic to the pharmacy. Please take as directed. It is important to increase your water intake. Please confirm that you have stopped the Janumet.

## 2019-07-05 NOTE — Telephone Encounter (Signed)
Left the patient a message to call back for lab results. 

## 2019-07-06 LAB — BMP8+EGFR
BUN/Creatinine Ratio: 14 (ref 12–28)
BUN: 26 mg/dL (ref 8–27)
CO2: 25 mmol/L (ref 20–29)
Calcium: 10.2 mg/dL (ref 8.7–10.3)
Chloride: 98 mmol/L (ref 96–106)
Creatinine, Ser: 1.81 mg/dL — ABNORMAL HIGH (ref 0.57–1.00)
GFR calc Af Amer: 33 mL/min/{1.73_m2} — ABNORMAL LOW (ref >59)
GFR calc non Af Amer: 28 mL/min/{1.73_m2} — ABNORMAL LOW (ref >59)
Glucose: 124 mg/dL — ABNORMAL HIGH (ref 65–99)
Potassium: 3.2 mmol/L — ABNORMAL LOW (ref 3.5–5.2)
Sodium: 139 mmol/L (ref 134–144)

## 2019-07-08 ENCOUNTER — Telehealth: Payer: Self-pay

## 2019-07-08 ENCOUNTER — Other Ambulatory Visit: Payer: Self-pay

## 2019-07-08 ENCOUNTER — Encounter: Payer: Self-pay | Admitting: Internal Medicine

## 2019-07-08 DIAGNOSIS — N184 Chronic kidney disease, stage 4 (severe): Secondary | ICD-10-CM

## 2019-07-08 NOTE — Telephone Encounter (Signed)
  Patient notified referral placed. Please refer her to [facility] for Stage 4 CKD. Let her know of referral. Thanks

## 2019-07-10 ENCOUNTER — Other Ambulatory Visit: Payer: Self-pay | Admitting: Radiology

## 2019-07-10 DIAGNOSIS — R921 Mammographic calcification found on diagnostic imaging of breast: Secondary | ICD-10-CM | POA: Diagnosis not present

## 2019-07-10 DIAGNOSIS — D0512 Intraductal carcinoma in situ of left breast: Secondary | ICD-10-CM | POA: Diagnosis not present

## 2019-07-10 DIAGNOSIS — Z0389 Encounter for observation for other suspected diseases and conditions ruled out: Secondary | ICD-10-CM | POA: Diagnosis not present

## 2019-07-11 ENCOUNTER — Telehealth: Payer: Self-pay

## 2019-07-11 NOTE — Telephone Encounter (Signed)
Pt called to inform provider of results of mammo. Pt states that her results came back that she has " pre cancer" and that they were going to run more test. Pt encouraged to call the office for any concerns

## 2019-07-15 DIAGNOSIS — D0512 Intraductal carcinoma in situ of left breast: Secondary | ICD-10-CM | POA: Diagnosis not present

## 2019-07-16 ENCOUNTER — Telehealth: Payer: Self-pay | Admitting: Radiation Oncology

## 2019-07-16 NOTE — Progress Notes (Signed)
Location of Breast Cancer: Ductal carcinoma in situ of Left breast.  Plan: Patient has decided to go ahead with lumpectomy, she will speak with Dr. Marlou Starks soon.  Will see patient again about 2-3 weeks post surgery to do SIM and about a month after surgery before radiation start date.  Possible 4 weeks of radiation treatment.  Did patient present with symptoms (if so, please note symptoms) or was this found on screening mammography?: Routine mammogram  Screening Mammogram: abnormal 6 mm area of calcification in the upper outer left breast.  Histology per Pathology Report: Left Breast 07/10/2019  Receptor Status: ER(+ 100%), PR (+ 100%), Her2-neu (), Ki-()    Past/Anticipated interventions by surgeon, if any: Dr. Marlou Starks 07/15/2019 - The patient appears to have a 6 mm area of intermediate grade ductal carcinoma in situ in the upper outer left breast. -I have talked to her in detail about the different options for treatment of this. -She seems very interested in the Comet study. -I will go ahead and refer her to medical and radiation oncology. -If she chooses observation I will plan to see her back in about 6 months with a follow-up mammogram.   Past/Anticipated interventions by medical oncology, if any: Chemotherapy    Lymphedema issues, if any:  No  Pain issues, if any:  No  SAFETY ISSUES:  Prior radiation? No   Pacemaker/ICD? No  Possible current pregnancy? Postmenopausal  Is the patient on methotrexate? No  Current Complaints / other details:   - Patient was taken off of Janumet due to antibiotic use.  Will resume soon.    Cori Razor, RN 07/16/2019,3:29 PM

## 2019-07-17 ENCOUNTER — Ambulatory Visit
Admission: RE | Admit: 2019-07-17 | Discharge: 2019-07-17 | Disposition: A | Discharge status: Home / Self Care | Payer: Medicare Other | Source: Ambulatory Visit | Attending: Radiation Oncology | Admitting: Radiation Oncology

## 2019-07-17 ENCOUNTER — Encounter: Payer: Self-pay | Admitting: Radiation Oncology

## 2019-07-17 ENCOUNTER — Other Ambulatory Visit: Payer: Self-pay

## 2019-07-17 ENCOUNTER — Encounter: Payer: Self-pay | Admitting: Adult Health

## 2019-07-17 VITALS — Ht 66.0 in | Wt 155.0 lb

## 2019-07-17 DIAGNOSIS — Z170 Estrogen receptor positive status [ER+]: Secondary | ICD-10-CM

## 2019-07-17 DIAGNOSIS — D0512 Intraductal carcinoma in situ of left breast: Secondary | ICD-10-CM | POA: Diagnosis not present

## 2019-07-17 DIAGNOSIS — C50412 Malignant neoplasm of upper-outer quadrant of left female breast: Secondary | ICD-10-CM

## 2019-07-18 NOTE — Progress Notes (Addendum)
Radiation Oncology         [phone] ________________________________  Name: Michele Wang MRN: [id number]  Date: 07/17/2019  DOB: 11-10-1951  FT:[id number], Bailey Mech, MD  Jovita Kussmaul, MD     REFERRING PHYSICIAN: [name], MD   DIAGNOSIS: The encounter diagnosis was Malignant neoplasm of upper-outer quadrant of left breast in female, estrogen receptor positive ([place]).   HISTORY OF PRESENT ILLNESS::Michele Wang is a 68 y.o. female who is seen for an initial consultation visit regarding the patient's diagnosis of DCIS of the left breast.  Due to the coronavirus epidemic, the patient was seen today through telemedicine with video conference.  The patient was found to have suspicious calcifications measuring 0.6 cm on a screening mammogram.  This was present in the upper outer quadrant of the left breast.  A biopsy was performed which revealed a grade 2 DCIS.  The tumor is estrogen receptor positive and progesterone receptor positive.  The patient's case has been discussed in multidisciplinary conference.  She was informed about the comet study for DCIS.  However, the patient has decided to proceed with standard treatment involving a lumpectomy.  I have therefore been asked to see the patient for consideration of adjuvant radiation treatment.    PREVIOUS RADIATION THERAPY: No   PAST MEDICAL HISTORY:  has a past medical history of Diabetes mellitus without complication ([place]), Hyperlipidemia, Hypertension, and UTI (urinary tract infection).     PAST SURGICAL HISTORY: Past Surgical History:  Procedure Laterality Date  . CESAREAN SECTION     x2     FAMILY HISTORY: family history includes Aneurysm in her mother; Healthy in her father.   SOCIAL HISTORY:  reports that she has never smoked. She has never used smokeless tobacco. She reports that she does not drink alcohol or use drugs.   ALLERGIES: Patient has no known allergies.   MEDICATIONS:  Current Outpatient Medications   Medication Sig Dispense Refill  . Ascorbic Acid (VITAMIN C) 1000 MG tablet Take 1,000 mg by mouth daily.    Marland Kitchen CALCIUM PO Take 1,000 mg by mouth daily.    . Olmesartan-amLODIPine-HCTZ (TRIBENZOR) 40-5-25 MG TABS Take 1 tablet by mouth daily. 90 tablet 2  . Vitamin D, Cholecalciferol, 25 MCG (1000 UT) CAPS Take by mouth.    . sitaGLIPtin-metformin (JANUMET) 50-500 MG tablet Take 1 tablet by mouth 2 (two) times daily with a meal. (Patient not taking: Reported on 07/17/2019) 180 tablet 2   No current facility-administered medications for this encounter.      REVIEW OF SYSTEMS:  A 15 point review of systems is documented in the electronic medical record. This was obtained by the nursing staff. However, I reviewed this with the patient to discuss relevant findings and make appropriate changes.  Pertinent items are noted in HPI.    PHYSICAL EXAM:  height is 5\' 6"  (1.676 m) and weight is 155 lb (70.3 kg).   ECOG = 0  0 - Asymptomatic (Fully active, able to carry on all predisease activities without restriction)  1 - Symptomatic but completely ambulatory (Restricted in physically strenuous activity but ambulatory and able to carry out work of a light or sedentary nature. For example, light housework, office work)  2 - Symptomatic, <50% in bed during the day (Ambulatory and capable of all self care but unable to carry out any work activities. Up and about more than 50% of waking hours)  3 - Symptomatic, >50% in bed, but not bedbound (Capable of only  limited self-care, confined to bed or chair 50% or more of waking hours)  4 - Bedbound (Completely disabled. Cannot carry on any self-care. Totally confined to bed or chair)  5 - Death   Eustace Pen MM, Creech RH, Tormey DC, et al. [phone]). "Toxicity and response criteria of the [facility]". [place] Oncol. 5 (6): 649-55  Alert, no distress   LABORATORY DATA:  Lab Results  Component Value Date   WBC 6.0 07/02/2019   HGB  13.0 07/02/2019   HCT 39.4 07/02/2019   MCV 90 07/02/2019   PLT 231 07/02/2019   Lab Results  Component Value Date   NA 139 07/05/2019   K 3.2 (L) 07/05/2019   CL 98 07/05/2019   CO2 25 07/05/2019   Lab Results  Component Value Date   ALT 31 07/02/2019   AST 29 07/02/2019   ALKPHOS 62 07/02/2019   BILITOT 0.6 07/02/2019      RADIOGRAPHY: No results found.     IMPRESSION/PLAN:  The patient has a new diagnosis of left-sided DCIS.  She has decided to proceed with lumpectomy.  She is also being scheduled to see medical oncology.  I discussed with the patient the recommendation to proceed with adjuvant radiation treatment postoperatively at the appropriate time.  I would anticipate a 4-week course of treatment to the left breast.  I discussed the rationale for treatment including improve local control.  We also discussed the possible side effects and risks of treatment as well.  All of her questions were answered and she does wish to proceed with treatment.  I look forward to seeing her postoperatively at the appropriate time to further evaluate her case and to further discuss and anticipated course of adjuvant radiation treatment.    Due to the coronavirus pandemic, this encounter was provided by telemedicine platform MyChart.  The patient has given verbal consent for this type of encounter and has been advised to only accept a meeting of this type in a secure network environment. The time spent during this encounter was 30 minutes and more than 50% of that time was spent in the coordination of the patient's care. The attendants for this meeting include  Dr. Lisbeth Renshaw and Fabian Sharp.  During the encounter,  Dr. Lisbeth Renshaw, and was located at [facility] Radiation Oncology Department.  The patient was located at home.   ________________________________   Jodelle Gross, MD, PhD   **Disclaimer: This note was dictated with voice recognition software. Similar sounding words can  inadvertently be transcribed and this note may contain transcription errors which may not have been corrected upon publication of note.**

## 2019-07-19 ENCOUNTER — Telehealth: Payer: Self-pay | Admitting: Hematology and Oncology

## 2019-07-19 NOTE — Telephone Encounter (Signed)
Received a new patient referral from Dr. Marlou Starks for breast cancer. Michele Wang has been cld and schedule to see [name] on 8/24 at 1pm. She's been made aware to arrive 15 minutes early.

## 2019-07-21 NOTE — Progress Notes (Signed)
[facility] CONSULT NOTE  Patient Care Team: [name], MD as PCP - General (Internal Medicine)  CHIEF COMPLAINTS/PURPOSE OF CONSULTATION:  Newly diagnosed left breast DCIS  HISTORY OF PRESENTING ILLNESS:  Michele Wang 68 y.o. female is here because of recent diagnosis of left breast ductal carcinoma in situ. The cancer was detected on a routine screening mammogram. Diagnostic mammogram showed a 0.6cm area of calcifications in the upper outer left breast. Biopsy on 07/10/19 showed DCIS with calcifications, intermediate grade, ER+100%, PR+ 100% positive. She presents to the clinic today for initial evaluation and discussion of treatment options.   I reviewed her records extensively and collaborated the history with the patient.  SUMMARY OF ONCOLOGIC HISTORY: Oncology History  Malignant neoplasm of upper-outer quadrant of left breast in female, estrogen receptor positive ([name])  07/10/2019 Cancer Staging   Staging form: Breast, AJCC 8th Edition - Clinical stage from 07/10/2019: Stage 0 (cTis (DCIS), cN0, cM0, ER+, PR+) - Signed by Gardenia Phlegm, NP on 07/17/2019   07/17/2019 Initial Diagnosis   Routine screening mammogram detected a 0.6cm area of calcifications in the upper outer left breast. Biopsy showed DCIS with calcifications, intermediate grade, ER+100%, PR+ 100% positive.     MEDICAL HISTORY:  Past Medical History:  Diagnosis Date  . Diabetes mellitus without complication ([place])    Type II  . Hyperlipidemia   . Hypertension   . UTI (urinary tract infection)     SURGICAL HISTORY: Past Surgical History:  Procedure Laterality Date  . CESAREAN SECTION     x2    SOCIAL HISTORY: Social History   Socioeconomic History  . Marital status: Single    Spouse name: Not on file  . Number of children: Not on file  . Years of education: Not on file  . Highest education level: Not on file  Occupational History  . Not on file  Social Needs  .  Financial resource strain: Not on file  . Food insecurity    Worry: Not on file    Inability: Not on file  . Transportation needs    Medical: No    Non-medical: No  Tobacco Use  . Smoking status: Never Smoker  . Smokeless tobacco: Never Used  Substance and Sexual Activity  . Alcohol use: No  . Drug use: No  . Sexual activity: Not on file  Lifestyle  . Physical activity    Days per week: Not on file    Minutes per session: Not on file  . Stress: Not on file  Relationships  . Social Herbalist on phone: Not on file    Gets together: Not on file    Attends religious service: Not on file    Active member of club or organization: Not on file    Attends meetings of clubs or organizations: Not on file    Relationship status: Not on file  . Intimate partner violence    Fear of current or ex partner: Not on file    Emotionally abused: Not on file    Physically abused: Not on file    Forced sexual activity: Not on file  Other Topics Concern  . Not on file  Social History Narrative  . Not on file    FAMILY HISTORY: Family History  Problem Relation Age of Onset  . Aneurysm Mother   . Healthy Father     ALLERGIES:  has No Known Allergies.  MEDICATIONS:  Current Outpatient Medications  Medication  Sig Dispense Refill  . Ascorbic Acid (VITAMIN C) 1000 MG tablet Take 1,000 mg by mouth daily.    Marland Kitchen CALCIUM PO Take 1,000 mg by mouth daily.    . Olmesartan-amLODIPine-HCTZ (TRIBENZOR) 40-5-25 MG TABS Take 1 tablet by mouth daily. 90 tablet 2  . sitaGLIPtin-metformin (JANUMET) 50-500 MG tablet Take 1 tablet by mouth 2 (two) times daily with a meal. (Patient not taking: Reported on 07/17/2019) 180 tablet 2  . Vitamin D, Cholecalciferol, 25 MCG (1000 UT) CAPS Take by mouth.     No current facility-administered medications for this visit.     REVIEW OF SYSTEMS:   Constitutional: Denies fevers, chills or abnormal night sweats Eyes: Denies blurriness of vision, double vision  or watery eyes Ears, nose, mouth, throat, and face: Denies mucositis or sore throat Respiratory: Denies cough, dyspnea or wheezes Cardiovascular: Denies palpitation, chest discomfort or lower extremity swelling Gastrointestinal:  Denies nausea, heartburn or change in bowel habits Skin: Denies abnormal skin rashes Lymphatics: Denies new lymphadenopathy or easy bruising Neurological:Denies numbness, tingling or new weaknesses Behavioral/Psych: Mood is stable, no new changes  Breast: Denies any palpable lumps or discharge All other systems were reviewed with the patient and are negative.  PHYSICAL EXAMINATION: ECOG PERFORMANCE STATUS: 0 - Asymptomatic  Vitals:   07/22/19 1327  BP: 131/82  Pulse: 78  Resp: 20  Temp: 98.9 F (37.2 C)  SpO2: 100%   Filed Weights   07/22/19 1327  Weight: 160 lb 6.4 oz (72.8 kg)    GENERAL:alert, no distress and comfortable SKIN: skin color, texture, turgor are normal, no rashes or significant lesions EYES: normal, conjunctiva are pink and non-injected, sclera clear OROPHARYNX:no exudate, no erythema and lips, buccal mucosa, and tongue normal  NECK: supple, thyroid normal size, non-tender, without nodularity LYMPH:  no palpable lymphadenopathy in the cervical, axillary or inguinal LUNGS: clear to auscultation and percussion with normal breathing effort HEART: regular rate & rhythm and no murmurs and no lower extremity edema ABDOMEN:abdomen soft, non-tender and normal bowel sounds Musculoskeletal:no cyanosis of digits and no clubbing  PSYCH: alert & oriented x 3 with fluent speech NEURO: no focal motor/sensory deficits BREAST: No palpable nodules in breast. No palpable axillary or supraclavicular lymphadenopathy (exam performed in the presence of a chaperone)   LABORATORY DATA:  I have reviewed the data as listed Lab Results  Component Value Date   WBC 6.0 07/02/2019   HGB 13.0 07/02/2019   HCT 39.4 07/02/2019   MCV 90 07/02/2019   PLT 231  07/02/2019   Lab Results  Component Value Date   NA 139 07/05/2019   K 3.2 (L) 07/05/2019   CL 98 07/05/2019   CO2 25 07/05/2019    RADIOGRAPHIC STUDIES: I have personally reviewed the radiological reports and agreed with the findings in the report.  ASSESSMENT AND PLAN:  Malignant neoplasm of upper-outer quadrant of left breast in female, estrogen receptor positive ([place]) 07/17/2019:Routine screening mammogram detected a 0.6cm area of calcifications in the upper outer left breast. Biopsy showed DCIS with calcifications, intermediate grade, ER+100%, PR+ 100% positive.  Pathology review: I discussed with the patient the difference between DCIS and invasive breast cancer. It is considered a precancerous lesion. DCIS is classified as a 0. It is generally detected through mammograms as calcifications. We discussed the significance of grades and its impact on prognosis. We also discussed the importance of ER and PR receptors and their implications to adjuvant treatment options. Prognosis of DCIS dependence on grade, comedo necrosis. It  is anticipated that if not treated, 20-30% of DCIS can develop into invasive breast cancer.  Recommendation: 1. Breast conserving surgery 2. Followed by adjuvant radiation therapy 3. Followed by antiestrogen therapy with tamoxifen 5 years  Tamoxifen counseling: We discussed the risks and benefits of tamoxifen. These include but not limited to insomnia, hot flashes, mood changes, vaginal dryness, and weight gain. Although rare, serious side effects including endometrial cancer, risk of blood clots were also discussed. We strongly believe that the benefits far outweigh the risks. Patient understands these risks and consented to starting treatment. Planned treatment duration is 5 years.  I discussed the pros and cons of COMET clinical trial. After listening to the trial, she decided not to participate.  Return to clinic after surgery to discuss the final pathology  report.  We can do a Doximity video visit.   All questions were answered. The patient knows to call the clinic with any problems, questions or concerns.   Rulon Eisenmenger, MD 07/22/2019     I, Molly Dorshimer, am acting as scribe for Nicholas Lose, MD.  I have reviewed the above documentation for accuracy and completeness, and I agree with the above.

## 2019-07-22 ENCOUNTER — Other Ambulatory Visit: Payer: Self-pay

## 2019-07-22 ENCOUNTER — Inpatient Hospital Stay: Payer: Medicare Other | Attending: Hematology and Oncology | Admitting: Hematology and Oncology

## 2019-07-22 ENCOUNTER — Encounter: Payer: Self-pay | Admitting: *Deleted

## 2019-07-22 DIAGNOSIS — Z79899 Other long term (current) drug therapy: Secondary | ICD-10-CM | POA: Insufficient documentation

## 2019-07-22 DIAGNOSIS — Z7984 Long term (current) use of oral hypoglycemic drugs: Secondary | ICD-10-CM | POA: Diagnosis not present

## 2019-07-22 DIAGNOSIS — Z170 Estrogen receptor positive status [ER+]: Secondary | ICD-10-CM | POA: Insufficient documentation

## 2019-07-22 DIAGNOSIS — E119 Type 2 diabetes mellitus without complications: Secondary | ICD-10-CM | POA: Insufficient documentation

## 2019-07-22 DIAGNOSIS — E785 Hyperlipidemia, unspecified: Secondary | ICD-10-CM | POA: Insufficient documentation

## 2019-07-22 DIAGNOSIS — I10 Essential (primary) hypertension: Secondary | ICD-10-CM | POA: Insufficient documentation

## 2019-07-22 DIAGNOSIS — C50412 Malignant neoplasm of upper-outer quadrant of left female breast: Secondary | ICD-10-CM

## 2019-07-22 NOTE — Assessment & Plan Note (Signed)
07/17/2019:Routine screening mammogram detected a 0.6cm area of calcifications in the upper outer left breast. Biopsy showed DCIS with calcifications, intermediate grade, ER+100%, PR+ 100% positive.  Pathology review: I discussed with the patient the difference between DCIS and invasive breast cancer. It is considered a precancerous lesion. DCIS is classified as a 0. It is generally detected through mammograms as calcifications. We discussed the significance of grades and its impact on prognosis. We also discussed the importance of ER and PR receptors and their implications to adjuvant treatment options. Prognosis of DCIS dependence on grade, comedo necrosis. It is anticipated that if not treated, 20-30% of DCIS can develop into invasive breast cancer.  Recommendation: 1. Breast conserving surgery 2. Followed by adjuvant radiation therapy 3. Followed by antiestrogen therapy with tamoxifen 5 years  Tamoxifen counseling: We discussed the risks and benefits of tamoxifen. These include but not limited to insomnia, hot flashes, mood changes, vaginal dryness, and weight gain. Although rare, serious side effects including endometrial cancer, risk of blood clots were also discussed. We strongly believe that the benefits far outweigh the risks. Patient understands these risks and consented to starting treatment. Planned treatment duration is 5 years.  I discussed the pros and cons of COMET clinical trial. After listening to the trial, she decided not to participate.  Return to clinic after surgery to discuss the final pathology report.  We can do a Doximity video visit.

## 2019-07-22 NOTE — Progress Notes (Signed)
Left vm for pt with contact information for questions or needs regarding dx or treatment care plan.

## 2019-07-29 ENCOUNTER — Ambulatory Visit: Payer: Self-pay | Admitting: General Surgery

## 2019-07-29 DIAGNOSIS — C50412 Malignant neoplasm of upper-outer quadrant of left female breast: Secondary | ICD-10-CM

## 2019-07-29 DIAGNOSIS — Z170 Estrogen receptor positive status [ER+]: Secondary | ICD-10-CM

## 2019-07-30 ENCOUNTER — Ambulatory Visit: Payer: Medicare Other | Admitting: Internal Medicine

## 2019-07-30 ENCOUNTER — Ambulatory Visit: Payer: Self-pay | Admitting: Internal Medicine

## 2019-07-30 DIAGNOSIS — E1122 Type 2 diabetes mellitus with diabetic chronic kidney disease: Secondary | ICD-10-CM | POA: Diagnosis not present

## 2019-07-30 DIAGNOSIS — N183 Chronic kidney disease, stage 3 (moderate): Secondary | ICD-10-CM | POA: Diagnosis not present

## 2019-07-30 DIAGNOSIS — D0512 Intraductal carcinoma in situ of left breast: Secondary | ICD-10-CM | POA: Diagnosis not present

## 2019-07-30 DIAGNOSIS — I129 Hypertensive chronic kidney disease with stage 1 through stage 4 chronic kidney disease, or unspecified chronic kidney disease: Secondary | ICD-10-CM | POA: Diagnosis not present

## 2019-07-30 DIAGNOSIS — N179 Acute kidney failure, unspecified: Secondary | ICD-10-CM | POA: Diagnosis not present

## 2019-08-02 ENCOUNTER — Telehealth: Payer: Self-pay | Admitting: Hematology and Oncology

## 2019-08-02 NOTE — Telephone Encounter (Signed)
Scheduled appt per 9/03 shc message- pt is aware of appt date and time

## 2019-08-08 ENCOUNTER — Encounter: Payer: Self-pay | Admitting: Internal Medicine

## 2019-08-08 ENCOUNTER — Other Ambulatory Visit: Payer: Self-pay

## 2019-08-08 ENCOUNTER — Ambulatory Visit (INDEPENDENT_AMBULATORY_CARE_PROVIDER_SITE_OTHER): Payer: Medicare Other | Admitting: Internal Medicine

## 2019-08-08 VITALS — BP 128/72 | HR 68 | Temp 98.3°F | Ht 66.0 in | Wt 155.6 lb

## 2019-08-08 DIAGNOSIS — N183 Chronic kidney disease, stage 3 unspecified: Secondary | ICD-10-CM

## 2019-08-08 DIAGNOSIS — I129 Hypertensive chronic kidney disease with stage 1 through stage 4 chronic kidney disease, or unspecified chronic kidney disease: Secondary | ICD-10-CM

## 2019-08-08 DIAGNOSIS — E663 Overweight: Secondary | ICD-10-CM

## 2019-08-08 DIAGNOSIS — C50412 Malignant neoplasm of upper-outer quadrant of left female breast: Secondary | ICD-10-CM | POA: Diagnosis not present

## 2019-08-08 DIAGNOSIS — E1122 Type 2 diabetes mellitus with diabetic chronic kidney disease: Secondary | ICD-10-CM

## 2019-08-08 DIAGNOSIS — Z170 Estrogen receptor positive status [ER+]: Secondary | ICD-10-CM | POA: Diagnosis not present

## 2019-08-08 MED ORDER — SITAGLIPTIN PHOSPHATE 100 MG PO TABS: 100.0000 mg | ORAL_TABLET | Freq: Every day | ORAL | 0 refills | Status: DC

## 2019-08-08 NOTE — Patient Instructions (Signed)
Chronic Kidney Disease, Adult °Chronic kidney disease (CKD) happens when the kidneys are damaged over a long period of time. The kidneys are two organs that help with: °· Getting rid of waste and extra fluid from the blood. °· Making hormones that maintain the amount of fluid in your tissues and blood vessels. °· Making sure that the body has the right amount of fluids and chemicals. °Most of the time, CKD does not go away, but it can usually be controlled. Steps must be taken to slow down the kidney damage or to stop it from getting worse. If this is not done, the kidneys may stop working. °Follow these instructions at home: °Medicines °· Take over-the-counter and prescription medicines only as told by your doctor. You may need to change the amount of medicines you take. °· Do not take any new medicines unless your doctor says it is okay. Many medicines can make your kidney damage worse. °· Do not take any vitamin and supplements unless your doctor says it is okay. Many vitamins and supplements can make your kidney damage worse. °General instructions °· Follow a diet as told by your doctor. You may need to stay away from: °? Alcohol. °? Salty foods. °? Foods that are high in: °§ Potassium. °§ Calcium. °§ Protein. °· Do not use any products that contain nicotine or tobacco, such as cigarettes and e-cigarettes. If you need help quitting, ask your doctor. °· Keep track of your blood pressure at home. Tell your doctor about any changes. °· If you have diabetes, keep track of your blood sugar as told by your doctor. °· Try to stay at a healthy weight. If you need help, ask your doctor. °· Exercise at least 30 minutes a day, 5 days a week. °· Stay up-to-date with your shots (immunizations) as told by your doctor. °· Keep all follow-up visits as told by your doctor. This is important. °Contact a doctor if: °· Your symptoms get worse. °· You have new symptoms. °Get help right away if: °· You have symptoms of end-stage  kidney disease. These may include: °? Headaches. °? Numbness in your hands or feet. °? Easy bruising. °? Having hiccups often. °? Chest pain. °? Shortness of breath. °? Stopping of menstrual periods in women. °· You have a fever. °· You have very little pee (urine). °· You have pain or bleeding when you pee. °Summary °· Chronic kidney disease (CKD) happens when the kidneys are damaged over a long period of time. °· Most of the time, this condition does not go away, but it can usually be controlled. Steps must be taken to slow down the kidney damage or to stop it from getting worse. °· Treatment may include a combination of medicines and lifestyle changes. °This information is not intended to replace advice given to you by your health care provider. Make sure you discuss any questions you have with your health care provider. °Document Released: 02/08/2010 Document Revised: 10/27/2017 Document Reviewed: 12/19/2016 °Elsevier Patient Education © 2020 [employer] ° °

## 2019-08-09 DIAGNOSIS — Z1211 Encounter for screening for malignant neoplasm of colon: Secondary | ICD-10-CM | POA: Diagnosis not present

## 2019-08-09 DIAGNOSIS — K635 Polyp of colon: Secondary | ICD-10-CM | POA: Diagnosis not present

## 2019-08-09 DIAGNOSIS — Z86010 Personal history of colonic polyps: Secondary | ICD-10-CM | POA: Diagnosis not present

## 2019-08-09 DIAGNOSIS — D125 Benign neoplasm of sigmoid colon: Secondary | ICD-10-CM | POA: Diagnosis not present

## 2019-08-09 LAB — HEMOGLOBIN A1C
Est. average glucose Bld gHb Est-mCnc: 157 mg/dL
Hgb A1c MFr Bld: 7.1 % — ABNORMAL HIGH (ref 4.8–5.6)

## 2019-08-09 LAB — HM COLONOSCOPY

## 2019-08-09 NOTE — Progress Notes (Signed)
Subjective:     Patient ID: Michele Wang , female    DOB: 06-Jul-1951 , 68 y.o.   MRN: [id number]   Chief Complaint  Patient presents with  . Diabetes    recently stopped janumet   . Hypertension    HPI  She is here today for DM check.  She recently stopped Janumet due to worsening renal function.  She reports her sugars have been stable.   Diabetes She presents for her follow-up diabetic visit. She has type 2 diabetes mellitus. There are no hypoglycemic associated symptoms. There are no diabetic associated symptoms. Pertinent negatives for diabetes include no blurred vision and no chest pain. There are no hypoglycemic complications. Diabetic complications include nephropathy. She is following a diabetic diet. She participates in exercise intermittently.  Hypertension This is a chronic problem. The current episode started more than 1 year ago. The problem has been gradually improving since onset. The problem is controlled. Pertinent negatives include no blurred vision, chest pain or shortness of breath. Past treatments include angiotensin blockers, diuretics and calcium channel blockers. The current treatment provides moderate improvement.     Past Medical History:  Diagnosis Date  . Diabetes mellitus without complication ([place])    Type II  . Hyperlipidemia   . Hypertension   . UTI (urinary tract infection)      Family History  Problem Relation Age of Onset  . Aneurysm Mother   . Healthy Father      Current Outpatient Medications:  .  Ascorbic Acid (VITAMIN C) 1000 MG tablet, Take 1,000 mg by mouth daily., Disp: , Rfl:  .  CALCIUM PO, Take 1,000 mg by mouth daily., Disp: , Rfl:  .  Olmesartan-amLODIPine-HCTZ (TRIBENZOR) 40-5-25 MG TABS, Take 1 tablet by mouth daily., Disp: 90 tablet, Rfl: 2 .  Vitamin D, Cholecalciferol, 25 MCG (1000 UT) CAPS, Take by mouth., Disp: , Rfl:  .  sitaGLIPtin (JANUVIA) 100 MG tablet, Take 1 tablet (100 mg total) by mouth daily., Disp: 30 tablet,  Rfl: 0   No Known Allergies   Review of Systems  Constitutional: Negative.   Eyes: Negative for blurred vision.  Respiratory: Negative.  Negative for shortness of breath.   Cardiovascular: Negative.  Negative for chest pain.  Gastrointestinal: Negative.   Neurological: Negative.   Psychiatric/Behavioral: Negative.      Today's Vitals   08/08/19 0959  BP: 128/72  Pulse: 68  Temp: 98.3 F (36.8 C)  TempSrc: Oral  SpO2: 97%  Weight: 155 lb 9.6 oz (70.6 kg)  Height: 5\' 6"  (1.676 m)   Body mass index is 25.11 kg/m.   Objective:  Physical Exam Vitals signs and nursing note reviewed.  Constitutional:      Appearance: Normal appearance.  HENT:     Head: Normocephalic and atraumatic.  Cardiovascular:     Rate and Rhythm: Normal rate and regular rhythm.     Heart sounds: Normal heart sounds.  Pulmonary:     Effort: Pulmonary effort is normal.     Breath sounds: Normal breath sounds.  Skin:    General: Skin is warm.  Neurological:     General: No focal deficit present.     Mental Status: She is alert.  Psychiatric:        Mood and Affect: Mood normal.        Behavior: Behavior normal.         Assessment And Plan:     1. Type 2 diabetes mellitus with stage 3 chronic  kidney disease, without long-term current use of insulin ([place])  I will check an a1c today. I will not check bmp today b/c she has f/u with [facility] in two weeks. I will request her labs from them. She is encouraged to stay well hydrated.   - Hemoglobin A1c  2. Hypertensive nephropathy  Chronic, well controlled. She will continue with 1/2 tab Tribenzor daily.  She is encouraged to avoid adding salt to his foods.   3. Malignant neoplasm of upper-outer quadrant of left breast in female, estrogen receptor positive ([place])  Newly diagnosed. She is now followed by Oncology. She is planning to move forward with lumpectomy and possible radiation therapy.   4. Overweight (BMI 25.0-29.9)  Her weight is stable.  No need for BMI f/u.   [name], MD    THE PATIENT IS ENCOURAGED TO PRACTICE SOCIAL DISTANCING DUE TO THE COVID-19 PANDEMIC.

## 2019-08-13 ENCOUNTER — Telehealth: Payer: Self-pay | Admitting: Radiation Oncology

## 2019-08-13 NOTE — Telephone Encounter (Signed)
New message:   LVM for patient to call back to schedule from referral received

## 2019-08-14 ENCOUNTER — Encounter: Payer: Self-pay | Admitting: Nurse Practitioner

## 2019-08-15 ENCOUNTER — Other Ambulatory Visit: Payer: Self-pay

## 2019-08-15 ENCOUNTER — Encounter (HOSPITAL_BASED_OUTPATIENT_CLINIC_OR_DEPARTMENT_OTHER): Payer: Self-pay | Admitting: *Deleted

## 2019-08-15 DIAGNOSIS — N179 Acute kidney failure, unspecified: Secondary | ICD-10-CM | POA: Diagnosis not present

## 2019-08-15 DIAGNOSIS — N390 Urinary tract infection, site not specified: Secondary | ICD-10-CM | POA: Diagnosis not present

## 2019-08-15 DIAGNOSIS — I129 Hypertensive chronic kidney disease with stage 1 through stage 4 chronic kidney disease, or unspecified chronic kidney disease: Secondary | ICD-10-CM | POA: Diagnosis not present

## 2019-08-16 ENCOUNTER — Encounter (HOSPITAL_BASED_OUTPATIENT_CLINIC_OR_DEPARTMENT_OTHER)
Admission: RE | Admit: 2019-08-16 | Discharge: 2019-08-16 | Disposition: A | Discharge status: Home / Self Care | Payer: Medicare Other | Source: Ambulatory Visit | Attending: General Surgery | Admitting: General Surgery

## 2019-08-16 DIAGNOSIS — Z01812 Encounter for preprocedural laboratory examination: Secondary | ICD-10-CM | POA: Insufficient documentation

## 2019-08-16 LAB — BASIC METABOLIC PANEL
Anion gap: 11 (ref 5–15)
BUN: 21 mg/dL (ref 8–23)
CO2: 26 mmol/L (ref 22–32)
Calcium: 10.2 mg/dL (ref 8.9–10.3)
Chloride: 102 mmol/L (ref 98–111)
Creatinine, Ser: 1.61 mg/dL — ABNORMAL HIGH (ref 0.44–1.00)
GFR calc Af Amer: 38 mL/min — ABNORMAL LOW (ref >60)
GFR calc non Af Amer: 33 mL/min — ABNORMAL LOW (ref >60)
Glucose, Bld: 110 mg/dL — ABNORMAL HIGH (ref 70–99)
Potassium: 3.3 mmol/L — ABNORMAL LOW (ref 3.5–5.1)
Sodium: 139 mmol/L (ref 135–145)

## 2019-08-16 NOTE — Progress Notes (Signed)

## 2019-08-16 NOTE — Progress Notes (Signed)
labs reviewed by Dr. Kalman Shan  and will proceed with surgery as scheduled.

## 2019-08-17 ENCOUNTER — Other Ambulatory Visit (HOSPITAL_COMMUNITY)
Admission: RE | Admit: 2019-08-17 | Discharge: 2019-08-17 | Disposition: A | Discharge status: Home / Self Care | Payer: Medicare Other | Source: Ambulatory Visit | Attending: General Surgery | Admitting: General Surgery

## 2019-08-17 DIAGNOSIS — Z01812 Encounter for preprocedural laboratory examination: Secondary | ICD-10-CM | POA: Diagnosis not present

## 2019-08-17 DIAGNOSIS — Z20828 Contact with and (suspected) exposure to other viral communicable diseases: Secondary | ICD-10-CM | POA: Diagnosis not present

## 2019-08-18 LAB — NOVEL CORONAVIRUS, NAA (HOSP ORDER, SEND-OUT TO REF LAB; TAT 18-24 HRS): SARS-CoV-2, NAA: NOT DETECTED

## 2019-08-19 ENCOUNTER — Other Ambulatory Visit: Payer: Self-pay

## 2019-08-19 MED ORDER — SITAGLIPTIN PHOSPHATE 100 MG PO TABS: 100.0000 mg | ORAL_TABLET | Freq: Every day | ORAL | 1 refills | Status: DC

## 2019-08-19 MED ORDER — OLMESARTAN-AMLODIPINE-HCTZ 40-5-25 MG PO TABS: 1.0000 | ORAL_TABLET | Freq: Every day | ORAL | 2 refills | Status: DC

## 2019-08-20 ENCOUNTER — Other Ambulatory Visit: Payer: Self-pay | Admitting: Internal Medicine

## 2019-08-20 DIAGNOSIS — N2889 Other specified disorders of kidney and ureter: Secondary | ICD-10-CM

## 2019-08-20 DIAGNOSIS — N179 Acute kidney failure, unspecified: Secondary | ICD-10-CM

## 2019-08-21 ENCOUNTER — Encounter (HOSPITAL_BASED_OUTPATIENT_CLINIC_OR_DEPARTMENT_OTHER)
Admission: RE | Disposition: A | Discharge status: Home / Self Care | Payer: Self-pay | Source: Home / Self Care | Attending: General Surgery

## 2019-08-21 ENCOUNTER — Other Ambulatory Visit: Payer: Self-pay

## 2019-08-21 ENCOUNTER — Ambulatory Visit (HOSPITAL_BASED_OUTPATIENT_CLINIC_OR_DEPARTMENT_OTHER): Payer: Medicare Other | Admitting: Certified Registered"

## 2019-08-21 ENCOUNTER — Encounter (HOSPITAL_BASED_OUTPATIENT_CLINIC_OR_DEPARTMENT_OTHER): Payer: Self-pay | Admitting: Emergency Medicine

## 2019-08-21 ENCOUNTER — Ambulatory Visit (HOSPITAL_BASED_OUTPATIENT_CLINIC_OR_DEPARTMENT_OTHER)
Admission: RE | Admit: 2019-08-21 | Discharge: 2019-08-21 | Disposition: A | Discharge status: Home / Self Care | Payer: Medicare Other | Attending: General Surgery | Admitting: General Surgery

## 2019-08-21 DIAGNOSIS — E7800 Pure hypercholesterolemia, unspecified: Secondary | ICD-10-CM | POA: Diagnosis not present

## 2019-08-21 DIAGNOSIS — I10 Essential (primary) hypertension: Secondary | ICD-10-CM | POA: Diagnosis not present

## 2019-08-21 DIAGNOSIS — Z8249 Family history of ischemic heart disease and other diseases of the circulatory system: Secondary | ICD-10-CM | POA: Insufficient documentation

## 2019-08-21 DIAGNOSIS — C50212 Malignant neoplasm of upper-inner quadrant of left female breast: Secondary | ICD-10-CM | POA: Diagnosis not present

## 2019-08-21 DIAGNOSIS — Z90710 Acquired absence of both cervix and uterus: Secondary | ICD-10-CM | POA: Diagnosis not present

## 2019-08-21 DIAGNOSIS — D0512 Intraductal carcinoma in situ of left breast: Secondary | ICD-10-CM | POA: Insufficient documentation

## 2019-08-21 DIAGNOSIS — E118 Type 2 diabetes mellitus with unspecified complications: Secondary | ICD-10-CM | POA: Insufficient documentation

## 2019-08-21 DIAGNOSIS — N183 Chronic kidney disease, stage 3 (moderate): Secondary | ICD-10-CM | POA: Diagnosis not present

## 2019-08-21 DIAGNOSIS — Z8261 Family history of arthritis: Secondary | ICD-10-CM | POA: Insufficient documentation

## 2019-08-21 DIAGNOSIS — E1122 Type 2 diabetes mellitus with diabetic chronic kidney disease: Secondary | ICD-10-CM | POA: Diagnosis not present

## 2019-08-21 DIAGNOSIS — Z9049 Acquired absence of other specified parts of digestive tract: Secondary | ICD-10-CM | POA: Insufficient documentation

## 2019-08-21 DIAGNOSIS — Z87442 Personal history of urinary calculi: Secondary | ICD-10-CM | POA: Insufficient documentation

## 2019-08-21 DIAGNOSIS — Z79899 Other long term (current) drug therapy: Secondary | ICD-10-CM | POA: Diagnosis not present

## 2019-08-21 DIAGNOSIS — Z7984 Long term (current) use of oral hypoglycemic drugs: Secondary | ICD-10-CM | POA: Diagnosis not present

## 2019-08-21 HISTORY — PX: BREAST LUMPECTOMY WITH RADIOACTIVE SEED LOCALIZATION: SHX6424

## 2019-08-21 LAB — GLUCOSE, CAPILLARY
Glucose-Capillary: 95 mg/dL (ref 70–99)
Glucose-Capillary: 102 mg/dL — ABNORMAL HIGH (ref 70–99)

## 2019-08-21 SURGERY — BREAST LUMPECTOMY WITH RADIOACTIVE SEED LOCALIZATION
Anesthesia: General | Site: Breast | Laterality: Left

## 2019-08-21 MED ORDER — ONDANSETRON HCL 4 MG/2ML IJ SOLN
INTRAMUSCULAR | Status: DC | PRN
  Administered 2019-08-21: 4 mg via INTRAVENOUS

## 2019-08-21 MED ORDER — LACTATED RINGERS IV SOLN
INTRAVENOUS | Status: DC
  Administered 2019-08-21: 13:00:00 via INTRAVENOUS

## 2019-08-21 MED ORDER — MEPERIDINE HCL 25 MG/ML IJ SOLN: 6.2500 mg | INTRAMUSCULAR | Status: DC | PRN

## 2019-08-21 MED ORDER — MIDAZOLAM HCL 5 MG/5ML IJ SOLN
INTRAMUSCULAR | Status: DC | PRN
  Administered 2019-08-21: 1 mg via INTRAVENOUS

## 2019-08-21 MED ORDER — EPHEDRINE 5 MG/ML INJ
INTRAVENOUS | Status: AC
  Filled 2019-08-21: qty 10

## 2019-08-21 MED ORDER — FENTANYL CITRATE (PF) 100 MCG/2ML IJ SOLN: 25.0000 ug | INTRAMUSCULAR | Status: DC | PRN

## 2019-08-21 MED ORDER — EPHEDRINE SULFATE-NACL 50-0.9 MG/10ML-% IV SOSY
PREFILLED_SYRINGE | INTRAVENOUS | Status: DC | PRN
  Administered 2019-08-21 (×2): 5 mg via INTRAVENOUS

## 2019-08-21 MED ORDER — ACETAMINOPHEN 325 MG PO TABS: 325.0000 mg | ORAL_TABLET | ORAL | Status: DC | PRN

## 2019-08-21 MED ORDER — HYDROCODONE-ACETAMINOPHEN 5-325 MG PO TABS: 1.0000 | ORAL_TABLET | Freq: Four times a day (QID) | ORAL | 0 refills | Status: DC | PRN

## 2019-08-21 MED ORDER — FENTANYL CITRATE (PF) 100 MCG/2ML IJ SOLN: 50.0000 ug | INTRAMUSCULAR | Status: DC | PRN

## 2019-08-21 MED ORDER — MIDAZOLAM HCL 2 MG/2ML IJ SOLN
INTRAMUSCULAR | Status: AC
  Filled 2019-08-21: qty 2

## 2019-08-21 MED ORDER — LIDOCAINE 2% (20 MG/ML) 5 ML SYRINGE
INTRAMUSCULAR | Status: AC
  Filled 2019-08-21: qty 5

## 2019-08-21 MED ORDER — PHENYLEPHRINE 40 MCG/ML (10ML) SYRINGE FOR IV PUSH (FOR BLOOD PRESSURE SUPPORT)
PREFILLED_SYRINGE | INTRAVENOUS | Status: DC | PRN
  Administered 2019-08-21: 80 ug via INTRAVENOUS
  Administered 2019-08-21: 120 ug via INTRAVENOUS
  Administered 2019-08-21: 80 ug via INTRAVENOUS
  Administered 2019-08-21: 40 ug via INTRAVENOUS
  Administered 2019-08-21: 80 ug via INTRAVENOUS

## 2019-08-21 MED ORDER — FENTANYL CITRATE (PF) 100 MCG/2ML IJ SOLN
INTRAMUSCULAR | Status: DC | PRN
  Administered 2019-08-21: 50 ug via INTRAVENOUS

## 2019-08-21 MED ORDER — LIDOCAINE HCL (CARDIAC) PF 100 MG/5ML IV SOSY
PREFILLED_SYRINGE | INTRAVENOUS | Status: DC | PRN
  Administered 2019-08-21: 40 mg via INTRAVENOUS

## 2019-08-21 MED ORDER — CELECOXIB 200 MG PO CAPS
ORAL_CAPSULE | ORAL | Status: AC
  Filled 2019-08-21: qty 1

## 2019-08-21 MED ORDER — GABAPENTIN 300 MG PO CAPS
300.0000 mg | ORAL_CAPSULE | ORAL | Status: AC
  Administered 2019-08-21: 300 mg via ORAL

## 2019-08-21 MED ORDER — MIDAZOLAM HCL 2 MG/2ML IJ SOLN: 1.0000 mg | INTRAMUSCULAR | Status: DC | PRN

## 2019-08-21 MED ORDER — OXYCODONE HCL 5 MG PO TABS
5.0000 mg | ORAL_TABLET | Freq: Once | ORAL | Status: AC | PRN
  Administered 2019-08-21: 5 mg via ORAL

## 2019-08-21 MED ORDER — OXYCODONE HCL 5 MG/5ML PO SOLN: 5.0000 mg | Freq: Once | ORAL | Status: AC | PRN

## 2019-08-21 MED ORDER — CHLORHEXIDINE GLUCONATE CLOTH 2 % EX PADS: 6.0000 | MEDICATED_PAD | Freq: Once | CUTANEOUS | Status: DC

## 2019-08-21 MED ORDER — ONDANSETRON HCL 4 MG/2ML IJ SOLN: 4.0000 mg | Freq: Once | INTRAMUSCULAR | Status: DC | PRN

## 2019-08-21 MED ORDER — FENTANYL CITRATE (PF) 100 MCG/2ML IJ SOLN
INTRAMUSCULAR | Status: AC
  Filled 2019-08-21: qty 2

## 2019-08-21 MED ORDER — GABAPENTIN 300 MG PO CAPS
ORAL_CAPSULE | ORAL | Status: AC
  Filled 2019-08-21: qty 1

## 2019-08-21 MED ORDER — CELECOXIB 200 MG PO CAPS
200.0000 mg | ORAL_CAPSULE | ORAL | Status: AC
  Administered 2019-08-21: 200 mg via ORAL

## 2019-08-21 MED ORDER — ACETAMINOPHEN 160 MG/5ML PO SOLN: 325.0000 mg | ORAL | Status: DC | PRN

## 2019-08-21 MED ORDER — PHENYLEPHRINE 40 MCG/ML (10ML) SYRINGE FOR IV PUSH (FOR BLOOD PRESSURE SUPPORT)
PREFILLED_SYRINGE | INTRAVENOUS | Status: AC
  Filled 2019-08-21: qty 10

## 2019-08-21 MED ORDER — OXYCODONE HCL 5 MG PO TABS
ORAL_TABLET | ORAL | Status: AC
  Filled 2019-08-21: qty 1

## 2019-08-21 MED ORDER — CEFAZOLIN SODIUM-DEXTROSE 2-4 GM/100ML-% IV SOLN
2.0000 g | INTRAVENOUS | Status: AC
  Administered 2019-08-21: 2 g via INTRAVENOUS

## 2019-08-21 MED ORDER — CEFAZOLIN SODIUM-DEXTROSE 2-4 GM/100ML-% IV SOLN
INTRAVENOUS | Status: AC
  Filled 2019-08-21: qty 100

## 2019-08-21 MED ORDER — PROPOFOL 10 MG/ML IV BOLUS
INTRAVENOUS | Status: DC | PRN
  Administered 2019-08-21: 150 mg via INTRAVENOUS

## 2019-08-21 MED ORDER — ACETAMINOPHEN 500 MG PO TABS
1000.0000 mg | ORAL_TABLET | ORAL | Status: AC
  Administered 2019-08-21: 1000 mg via ORAL

## 2019-08-21 MED ORDER — ACETAMINOPHEN 500 MG PO TABS
ORAL_TABLET | ORAL | Status: AC
  Filled 2019-08-21: qty 2

## 2019-08-21 MED ORDER — BUPIVACAINE-EPINEPHRINE (PF) 0.25% -1:200000 IJ SOLN
INTRAMUSCULAR | Status: DC | PRN
  Administered 2019-08-21: 20 mL via PERINEURAL

## 2019-08-21 MED ORDER — DEXAMETHASONE SODIUM PHOSPHATE 10 MG/ML IJ SOLN
INTRAMUSCULAR | Status: DC | PRN
  Administered 2019-08-21: 5 mg via INTRAVENOUS

## 2019-08-21 SURGICAL SUPPLY — 47 items
ADH SKN CLS APL DERMABOND .7 (GAUZE/BANDAGES/DRESSINGS) ×1
APL PRP STRL LF DISP 70% ISPRP (MISCELLANEOUS) ×1
APPLIER CLIP 9.375 MED OPEN (MISCELLANEOUS)
APR CLP MED 9.3 20 MLT OPN (MISCELLANEOUS)
BINDER BREAST LRG (GAUZE/BANDAGES/DRESSINGS) ×1 IMPLANT
BLADE SURG 15 STRL LF DISP TIS (BLADE) ×1 IMPLANT
BLADE SURG 15 STRL SS (BLADE) ×2
CANISTER SUC SOCK COL 7IN (MISCELLANEOUS) ×1 IMPLANT
CANISTER SUCT 1200ML W/VALVE (MISCELLANEOUS) ×1 IMPLANT
CHLORAPREP W/TINT 26 (MISCELLANEOUS) ×2 IMPLANT
CLIP APPLIE 9.375 MED OPEN (MISCELLANEOUS) IMPLANT
COVER BACK TABLE REUSABLE LG (DRAPES) ×2 IMPLANT
COVER MAYO STAND REUSABLE (DRAPES) ×2 IMPLANT
COVER PROBE W GEL 5X96 (DRAPES) ×2 IMPLANT
COVER WAND RF STERILE (DRAPES) IMPLANT
DECANTER SPIKE VIAL GLASS SM (MISCELLANEOUS) ×1 IMPLANT
DERMABOND ADVANCED (GAUZE/BANDAGES/DRESSINGS) ×1
DERMABOND ADVANCED .7 DNX12 (GAUZE/BANDAGES/DRESSINGS) ×1 IMPLANT
DRAPE LAPAROSCOPIC ABDOMINAL (DRAPES) ×2 IMPLANT
DRAPE UTILITY XL STRL (DRAPES) ×2 IMPLANT
ELECT COATED BLADE 2.86 ST (ELECTRODE) ×2 IMPLANT
ELECT REM PT RETURN 9FT ADLT (ELECTROSURGICAL) ×2
ELECTRODE REM PT RTRN 9FT ADLT (ELECTROSURGICAL) ×1 IMPLANT
GLOVE BIO SURGEON STRL SZ 6.5 (GLOVE) ×1 IMPLANT
GLOVE BIO SURGEON STRL SZ7.5 (GLOVE) ×4 IMPLANT
GLOVE BIOGEL PI IND STRL 6.5 (GLOVE) IMPLANT
GLOVE BIOGEL PI INDICATOR 6.5 (GLOVE) ×1
GOWN STRL REUS W/ TWL LRG LVL3 (GOWN DISPOSABLE) ×2 IMPLANT
GOWN STRL REUS W/TWL LRG LVL3 (GOWN DISPOSABLE) ×4
ILLUMINATOR WAVEGUIDE N/F (MISCELLANEOUS) IMPLANT
KIT MARKER MARGIN INK (KITS) ×2 IMPLANT
LIGHT WAVEGUIDE WIDE FLAT (MISCELLANEOUS) IMPLANT
NDL HYPO 25X1 1.5 SAFETY (NEEDLE) IMPLANT
NEEDLE HYPO 25X1 1.5 SAFETY (NEEDLE) IMPLANT
NS IRRIG 1000ML POUR BTL (IV SOLUTION) ×1 IMPLANT
PACK BASIN DAY SURGERY FS (CUSTOM PROCEDURE TRAY) ×2 IMPLANT
PENCIL BUTTON HOLSTER BLD 10FT (ELECTRODE) ×2 IMPLANT
SLEEVE SCD COMPRESS KNEE MED (MISCELLANEOUS) ×2 IMPLANT
SPONGE LAP 18X18 RF (DISPOSABLE) ×2 IMPLANT
SUT MON AB 4-0 PC3 18 (SUTURE) ×2 IMPLANT
SUT SILK 2 0 SH (SUTURE) IMPLANT
SUT VICRYL 3-0 CR8 SH (SUTURE) ×2 IMPLANT
SYR CONTROL 10ML LL (SYRINGE) IMPLANT
TOWEL GREEN STERILE FF (TOWEL DISPOSABLE) ×2 IMPLANT
TRAY FAXITRON CT DISP (TRAY / TRAY PROCEDURE) ×2 IMPLANT
TUBE CONNECTING 20X1/4 (TUBING) ×2 IMPLANT
YANKAUER SUCT BULB TIP NO VENT (SUCTIONS) IMPLANT

## 2019-08-21 NOTE — Anesthesia Procedure Notes (Signed)
Procedure Name: LMA Insertion Date/Time: 08/21/2019 2:27 PM Performed by: Raenette Rover, CRNA Pre-anesthesia Checklist: Patient identified, Emergency Drugs available, Suction available and Patient being monitored Patient Re-evaluated:Patient Re-evaluated prior to induction Oxygen Delivery Method: Circle system utilized Preoxygenation: Pre-oxygenation with 100% oxygen Induction Type: IV induction LMA: LMA inserted LMA Size: 4.0 Number of attempts: 1 Placement Confirmation: positive ETCO2 and breath sounds checked- equal and bilateral Tube secured with: Tape Dental Injury: Teeth and Oropharynx as per pre-operative assessment

## 2019-08-21 NOTE — H&P (Addendum)
Michele Wang  Location: [facility] Patient #: [id number] DOB: [birth year] Married / Language: English / Race: Black or African American Female   History of Present Illness  The patient is a 68 year old female who presents with breast cancer. We are asked to see the patient in consultation by Dr. Emmit Pomfret to evaluate her for ductal carcinoma in situ of the left breast. The patient is a 68 year old black female who recently went for a routine screening mammogram. At that time she was found to have a 6 mm area of calcification in the upper outer left breast that looked abnormal. 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 She denies any family history of breast cancer. She does not smoke.   Past Surgical History  Cesarean Section - 1  Gallbladder Surgery - Open  Hysterectomy (not due to cancer) - Partial   Diagnostic Studies History Colonoscopy  5-10 years ago Mammogram  1-3 years ago Pap Smear  1-5 years ago  Allergies No Known Allergies   Medication History  Janumet XR (100-1000MG  Tablet ER 24HR, Oral) Active. Olmesartan-amLODIPine-HCTZ (20-5-12.5MG  Tablet, Oral) Active. Medications Reconciled  Social History Caffeine use  Coffee. No alcohol use  No drug use  Tobacco use  Never smoker.  Family History  Arthritis  Mother. Heart Disease  Mother. Hypertension  Mother.  Pregnancy / Birth History Age at menarche  47 years. Age of menopause  58-60 Contraceptive History  Oral contraceptives. Gravida  2 Irregular periods  Length (months) of breastfeeding  3-6 Maternal age  36-40  Other Problems  Bladder Problems  High blood pressure  Hypercholesterolemia  Kidney Stone     Review of Systems  General Present- Fatigue and Weight Loss. Not Present- Appetite Loss, Chills, Fever, Night Sweats and Weight Gain. Skin Not Present- Change in  Wart/Mole, Dryness, Hives, Jaundice, New Lesions, Non-Healing Wounds, Rash and Ulcer. HEENT Present- Wears glasses/contact lenses. Not Present- Earache, Hearing Loss, Hoarseness, Nose Bleed, Oral Ulcers, Ringing in the Ears, Seasonal Allergies, Sinus Pain, Sore Throat, Visual Disturbances and Yellow Eyes. Respiratory Not Present- Bloody sputum, Chronic Cough, Difficulty Breathing, Snoring and Wheezing. Breast Not Present- Breast Mass, Breast Pain, Nipple Discharge and Skin Changes. Cardiovascular Not Present- Chest Pain, Difficulty Breathing Lying Down, Leg Cramps, Palpitations, Rapid Heart Rate, Shortness of Breath and Swelling of Extremities. Gastrointestinal Not Present- Abdominal Pain, Bloating, Bloody Stool, Change in Bowel Habits, Chronic diarrhea, Constipation, Difficulty Swallowing, Excessive gas, Gets full quickly at meals, Hemorrhoids, Indigestion, Nausea, Rectal Pain and Vomiting. Female Genitourinary Not Present- Frequency, Nocturia, Painful Urination, Pelvic Pain and Urgency. Neurological Not Present- Decreased Memory, Fainting, Headaches, Numbness, Seizures, Tingling, Tremor, Trouble walking and Weakness. Psychiatric Not Present- Anxiety, Bipolar, Change in Sleep Pattern, Depression, Fearful and Frequent crying. Endocrine Present- New Diabetes. Not Present- Cold Intolerance, Excessive Hunger, Hair Changes, Heat Intolerance and Hot flashes. Hematology Not Present- Blood Thinners, Easy Bruising, Excessive bleeding, Gland problems, HIV and Persistent Infections.  Vitals  Weight: 157.6 lb Height: 66in Body Surface Area: 1.81 m Body Mass Index: 25.44 kg/m  Temp.: 97.1F (Temporal)  Pulse: 97 (Regular)  BP: 142/84(Sitting, Left Arm, Standard)       Physical Exam General Mental Status-Alert. General Appearance-Consistent with stated age. Hydration-Well hydrated. Voice-Normal.  Head and Neck Head-normocephalic, atraumatic with no lesions or palpable  masses. Trachea-midline. Thyroid Gland Characteristics - normal size and consistency.  Eye Eyeball - Bilateral-Extraocular movements intact. Sclera/Conjunctiva - Bilateral-No scleral  icterus.  Chest and Lung Exam Chest and lung exam reveals -quiet, even and easy respiratory effort with no use of accessory muscles and on auscultation, normal breath sounds, no adventitious sounds and normal vocal resonance. Inspection Chest Wall - Normal. Back - normal.  Breast Note: There is no palpable mass in either breast. There is no palpable axillary, supraclavicular, or cervical lymphadenopathy.   Cardiovascular Cardiovascular examination reveals -normal heart sounds, regular rate and rhythm with no murmurs and normal pedal pulses bilaterally.  Abdomen Inspection Inspection of the abdomen reveals - No Hernias. Skin - Scar - no surgical scars. Palpation/Percussion Palpation and Percussion of the abdomen reveal - Soft, Non Tender, No Rebound tenderness, No Rigidity (guarding) and No hepatosplenomegaly. Auscultation Auscultation of the abdomen reveals - Bowel sounds normal.  Neurologic Neurologic evaluation reveals -alert and oriented x 3 with no impairment of recent or remote memory. Mental Status-Normal.  Musculoskeletal Normal Exam - Left-Upper Extremity Strength Normal and Lower Extremity Strength Normal. Normal Exam - Right-Upper Extremity Strength Normal and Lower Extremity Strength Normal.  Lymphatic Head & Neck  General Head & Neck Lymphatics: Bilateral - Description - Normal. Axillary  General Axillary Region: Bilateral - Description - Normal. Tenderness - Non Tender. Femoral & Inguinal  Generalized Femoral & Inguinal Lymphatics: Bilateral - Description - Normal. Tenderness - Non Tender.    Assessment & Plan  DUCTAL CARCINOMA IN SITU (DCIS) OF LEFT BREAST (D05.12) Impression: The patient appears to have a 6 mm area of intermediate grade ductal  carcinoma in situ in the upper outer left breast. I have talked to her in detail about the different options for treatment of this. She seems very interested in the Comet study. I will go ahead and refer her to medical and radiation oncology. If she chooses observation and I will plan to see her back in about 6 months with a follow-up mammogram. She will continue to do regular self exams.  Current Plans Referred to Oncology, for evaluation and follow up (Oncology). Routine. Follow up with [place] in the office in 6 MONTHS.   Call us sooner as needed.  She decided on lumpectomy

## 2019-08-21 NOTE — Transfer of Care (Signed)
Immediate Anesthesia Transfer of Care Note  Patient: Michele Wang  Procedure(s) Performed: LEFT BREAST LUMPECTOMY WITH RADIOACTIVE SEED LOCALIZATION (Left Breast)  Patient Location: PACU  Anesthesia Type:General  Level of Consciousness: awake, alert , oriented, drowsy and patient cooperative  Airway & Oxygen Therapy: Patient Spontanous Breathing and Patient connected to face mask oxygen  Post-op Assessment: Report given to RN and Post -op Vital signs reviewed and stable  Post vital signs: Reviewed and stable  Last Vitals:  Vitals Value Taken Time  BP 147/75 08/21/19 1516  Temp    Pulse 81 08/21/19 1517  Resp 15 08/21/19 1517  SpO2 100 % 08/21/19 1517  Vitals shown include unvalidated device data.  Last Pain:  Vitals:   08/21/19 1255  TempSrc: Oral  PainSc: 0-No pain         Complications: No apparent anesthesia complications

## 2019-08-21 NOTE — Anesthesia Postprocedure Evaluation (Signed)
Anesthesia Post Note  Patient: Michele Wang  Procedure(s) Performed: LEFT BREAST LUMPECTOMY WITH RADIOACTIVE SEED LOCALIZATION (Left Breast)     Patient location during evaluation: Phase II Anesthesia Type: General Level of consciousness: awake Pain management: pain level controlled Vital Signs Assessment: post-procedure vital signs reviewed and stable Respiratory status: spontaneous breathing Cardiovascular status: stable Postop Assessment: no apparent nausea or vomiting Anesthetic complications: no    Last Vitals:  Vitals:   08/21/19 1530 08/21/19 1545  BP: (!) 143/79 (!) 149/79  Pulse: 73 71  Resp: 13 12  Temp:    SpO2: 100% 99%    Last Pain:  Vitals:   08/21/19 1545  TempSrc:   PainSc: 1    Pain Goal:                   Huston Foley

## 2019-08-21 NOTE — Discharge Instructions (Signed)
°  Post Anesthesia Home Care Instructions  NO TYLENOL OR IBUPROFEN UNTIL AFTER 7:00PM 08/21/2019.   Activity: Get plenty of rest for the remainder of the day. A responsible individual must stay with you for 24 hours following the procedure.  For the next 24 hours, DO NOT: -Drive a car -Paediatric nurse -Drink alcoholic beverages -Take any medication unless instructed by your physician -Make any legal decisions or sign important papers.  Meals: Start with liquid foods such as gelatin or soup. Progress to regular foods as tolerated. Avoid greasy, spicy, heavy foods. If nausea and/or vomiting occur, drink only clear liquids until the nausea and/or vomiting subsides. Call your physician if vomiting continues.  Special Instructions/Symptoms: Your throat may feel dry or sore from the anesthesia or the breathing tube placed in your throat during surgery. If this causes discomfort, gargle with warm salt water. The discomfort should disappear within 24 hours.  If you had a scopolamine patch placed behind your ear for the management of post- operative nausea and/or vomiting:  1. The medication in the patch is effective for 72 hours, after which it should be removed.  Wrap patch in a tissue and discard in the trash. Wash hands thoroughly with soap and water. 2. You may remove the patch earlier than 72 hours if you experience unpleasant side effects which may include dry mouth, dizziness or visual disturbances. 3. Avoid touching the patch. Wash your hands with soap and water after contact with the patch.

## 2019-08-21 NOTE — Op Note (Signed)
08/21/2019  3:11 PM  PATIENT:  Michele Wang  68 y.o. female  PRE-OPERATIVE DIAGNOSIS:  LEFT BREAST DCIS  POST-OPERATIVE DIAGNOSIS:  LEFT BREAST DCIS  PROCEDURE:  Procedure(s): LEFT BREAST LUMPECTOMY WITH RADIOACTIVE SEED LOCALIZATION (Left)  SURGEON:  Surgeon(s) and Role:    Jovita Kussmaul, MD - Primary  PHYSICIAN ASSISTANT:   ASSISTANTS: none   ANESTHESIA:   local and general  EBL:  5 mL   BLOOD ADMINISTERED:none  DRAINS: none   LOCAL MEDICATIONS USED:  MARCAINE     SPECIMEN:  Source of Specimen:  left breast tissue with additional lateral margin  DISPOSITION OF SPECIMEN:  PATHOLOGY  COUNTS:  YES  TOURNIQUET:  * No tourniquets in log *  DICTATION: .Dragon Dictation   After informed consent was obtained the patient was brought to the operating room and placed in the supine position on the operating table.  After adequate induction of general anesthesia the patient's left breast was prepped with ChloraPrep, allowed to dry, and draped in usual sterile manner.  An appropriate timeout was performed.  Previously an I-125 seed was placed in the upper outer quadrant of the left breast to mark an area of ductal carcinoma in situ.  The neoprobe was set to I-125 in the area of radioactivity was readily identified.  The area around this was infiltrated with half percent Marcaine.  A curvilinear incision was then made along the edge of the areola in the upper outer quadrant.  The incision was carried through the skin and subcutaneous tissue sharply with the electrocautery.  Dissection was then carried out sharply with the electrocautery between the breast tissue and the subcutaneous fat throughout the upper outer quadrant until we were well beyond the area of the cancer.  Next a circular portion of breast tissue was excised sharply around the radioactive seed while checking the area of radioactivity frequently.  Once the specimen was removed it was oriented with the appropriate paint  colors.  A specimen radiograph was obtained that showed the clip and seed to be near the center of the specimen although one area seem to be close to the lateral margin.  An additional lateral margin was taken and marked appropriately.  All of the specimens were then sent to pathology for further evaluation.  Hemostasis was achieved using the Bovie electrocautery.  The wound was then irrigated with saline and infiltrated with more half percent Marcaine.  The cavity was marked with clips.  The deep layer of the wound was then closed with layers of interrupted 3-0 Vicryl stitches.  The skin was then closed with interrupted 4-0 Monocryl subcuticular stitches.  Dermabond dressings were applied.  The patient tolerated the procedure well.  At the end of the case all needle sponge and instrument counts were correct.  The patient was then awakened and taken to recovery in stable condition.  PLAN OF CARE: Discharge to home after PACU  PATIENT DISPOSITION:  PACU - hemodynamically stable.   Delay start of Pharmacological VTE agent (>24hrs) due to surgical blood loss or risk of bleeding: not applicable

## 2019-08-21 NOTE — Interval H&P Note (Signed)
History and Physical Interval Note:  08/21/2019 1:51 PM  Michele Wang  has presented today for surgery, with the diagnosis of LEFT BREAST DCIS.  The various methods of treatment have been discussed with the patient and family. After consideration of risks, benefits and other options for treatment, the patient has consented to  Procedure(s): LEFT BREAST LUMPECTOMY WITH RADIOACTIVE SEED LOCALIZATION (Left) as a surgical intervention.  The patient's history has been reviewed, patient examined, no change in status, stable for surgery.  I have reviewed the patient's chart and labs.  Questions were answered to the patient's satisfaction.     [name]

## 2019-08-21 NOTE — Anesthesia Preprocedure Evaluation (Signed)
Anesthesia Evaluation  Patient identified by MRN, date of birth, ID band Patient awake    Reviewed: Allergy & Precautions, NPO status , Patient's Chart, lab work & pertinent test results  Airway Mallampati: I       Dental no notable dental hx. (+) Teeth Intact   Pulmonary neg pulmonary ROS,    Pulmonary exam normal breath sounds clear to auscultation       Cardiovascular hypertension, Pt. on medications Normal cardiovascular exam Rhythm:Regular Rate:Normal     Neuro/Psych negative neurological ROS  negative psych ROS   GI/Hepatic negative GI ROS, Neg liver ROS,   Endo/Other  diabetes, Type 2, Oral Hypoglycemic Agents  Renal/GU Renal diseaseCKD III  negative genitourinary   Musculoskeletal negative musculoskeletal ROS (+)   Abdominal Normal abdominal exam  (+)   Peds  Hematology negative hematology ROS (+)   Anesthesia Other Findings   Reproductive/Obstetrics                             Anesthesia Physical Anesthesia Plan  ASA: II  Anesthesia Plan: General   Post-op Pain Management:    Induction: Intravenous  PONV Risk Score and Plan: 3 and Ondansetron, Dexamethasone and Midazolam  Airway Management Planned: LMA  Additional Equipment: None  Intra-op Plan:   Post-operative Plan: Extubation in OR  Informed Consent: I have reviewed the patients History and Physical, chart, labs and discussed the procedure including the risks, benefits and alternatives for the proposed anesthesia with the patient or authorized representative who has indicated his/her understanding and acceptance.     Dental advisory given  Plan Discussed with: CRNA  Anesthesia Plan Comments:         Anesthesia Quick Evaluation

## 2019-08-23 ENCOUNTER — Encounter (HOSPITAL_BASED_OUTPATIENT_CLINIC_OR_DEPARTMENT_OTHER): Payer: Self-pay | Admitting: General Surgery

## 2019-08-23 LAB — SURGICAL PATHOLOGY

## 2019-08-26 ENCOUNTER — Other Ambulatory Visit: Payer: Self-pay

## 2019-08-26 MED ORDER — OLMESARTAN-AMLODIPINE-HCTZ 40-5-25 MG PO TABS: 1.0000 | ORAL_TABLET | Freq: Every day | ORAL | 2 refills | Status: DC

## 2019-08-26 MED ORDER — SITAGLIPTIN PHOSPHATE 100 MG PO TABS: 100.0000 mg | ORAL_TABLET | Freq: Every day | ORAL | 1 refills | Status: DC

## 2019-08-27 NOTE — Addendum Note (Signed)
Encounter addended by: Kyung Rudd, MD on: 08/27/2019 3:08 PM  Actions taken: Clinical Note Signed

## 2019-08-28 ENCOUNTER — Ambulatory Visit
Admission: RE | Admit: 2019-08-28 | Discharge: 2019-08-28 | Disposition: A | Discharge status: Home / Self Care | Payer: Medicare Other | Source: Ambulatory Visit | Attending: Internal Medicine | Admitting: Internal Medicine

## 2019-08-28 DIAGNOSIS — N200 Calculus of kidney: Secondary | ICD-10-CM | POA: Diagnosis not present

## 2019-08-28 DIAGNOSIS — N179 Acute kidney failure, unspecified: Secondary | ICD-10-CM

## 2019-08-28 NOTE — Progress Notes (Signed)
Patient Care Team: [name], MD as PCP - General (Internal Medicine)  DIAGNOSIS:    ICD-10-CM   1. Ductal carcinoma in situ (DCIS) of left breast  D05.12     SUMMARY OF ONCOLOGIC HISTORY: Oncology History  Ductal carcinoma in situ (DCIS) of left breast  07/10/2019 Cancer Staging   Staging form: Breast, AJCC 8th Edition - Clinical stage from 07/10/2019: Stage 0 (cTis (DCIS), cN0, cM0, ER+, PR+) - Signed by Gardenia Phlegm, NP on 07/17/2019   07/17/2019 Initial Diagnosis   Routine screening mammogram detected a 0.6cm area of calcifications in the upper outer left breast. Biopsy showed DCIS with calcifications, intermediate grade, ER+100%, PR+ 100% positive.   08/21/2019 Surgery   Left lumpectomy Marlou Starks): low grade DCIS spanning 0.4cm, clear margins, and no invasive carcinoma     CHIEF COMPLIANT: Follow-up s/p lumpectomy to review pathology  INTERVAL HISTORY: Michele Wang is a 68 y.o. with above-mentioned history of left breast DCIS. She underwent a lumpectomy with Dr. Marlou Starks on 08/21/19 for which pathology showed low grade DCIS spanning 0.4cm, clear margins, and no invasive carcinoma. She presents to the clinic today to discuss the pathology report and further treatment.   REVIEW OF SYSTEMS:   Constitutional: Denies fevers, chills or abnormal weight loss Eyes: Denies blurriness of vision Ears, nose, mouth, throat, and face: Denies mucositis or sore throat Respiratory: Denies cough, dyspnea or wheezes Cardiovascular: Denies palpitation, chest discomfort Gastrointestinal: Denies nausea, heartburn or change in bowel habits Skin: Denies abnormal skin rashes Lymphatics: Denies new lymphadenopathy or easy bruising Neurological: Denies numbness, tingling or new weaknesses Behavioral/Psych: Mood is stable, no new changes  Extremities: No lower extremity edema Breast: denies any pain or lumps or nodules in either breasts All other systems were reviewed with the patient and are  negative.  I have reviewed the past medical history, past surgical history, social history and family history with the patient and they are unchanged from previous note.  ALLERGIES:  has No Known Allergies.  MEDICATIONS:  Current Outpatient Medications  Medication Sig Dispense Refill  . Ascorbic Acid (VITAMIN C) 1000 MG tablet Take 1,000 mg by mouth daily.    Marland Kitchen CALCIUM PO Take 1,000 mg by mouth daily.    Marland Kitchen HYDROcodone-acetaminophen (NORCO/VICODIN) 5-325 MG tablet Take 1-2 tablets by mouth every 6 (six) hours as needed for moderate pain or severe pain. 15 tablet 0  . Olmesartan-amLODIPine-HCTZ (TRIBENZOR) 40-5-25 MG TABS Take 1 tablet by mouth daily. 90 tablet 2  . sitaGLIPtin (JANUVIA) 100 MG tablet Take 1 tablet (100 mg total) by mouth daily. 90 tablet 1  . Vitamin D, Cholecalciferol, 25 MCG (1000 UT) CAPS Take by mouth.     No current facility-administered medications for this visit.     PHYSICAL EXAMINATION: ECOG PERFORMANCE STATUS: 1 - Symptomatic but completely ambulatory  There were no vitals filed for this visit. There were no vitals filed for this visit.  GENERAL: alert, no distress and comfortable SKIN: skin color, texture, turgor are normal, no rashes or significant lesions EYES: normal, Conjunctiva are pink and non-injected, sclera clear OROPHARYNX: no exudate, no erythema and lips, buccal mucosa, and tongue normal  NECK: supple, thyroid normal size, non-tender, without nodularity LYMPH: no palpable lymphadenopathy in the cervical, axillary or inguinal LUNGS: clear to auscultation and percussion with normal breathing effort HEART: regular rate & rhythm and no murmurs and no lower extremity edema ABDOMEN: abdomen soft, non-tender and normal bowel sounds MUSCULOSKELETAL: no cyanosis of digits and no clubbing  NEURO: alert & oriented x 3 with fluent speech, no focal motor/sensory deficits EXTREMITIES: No lower extremity edema  LABORATORY DATA:  I have reviewed the data  as listed CMP Latest Ref Rng & Units 08/16/2019 07/05/2019 07/02/2019  Glucose 70 - 99 mg/dL 110(H) 124(H) -  BUN 8 - 23 mg/dL 21 26 -  Creatinine 0.44 - 1.00 mg/dL 1.61(H) 1.81(H) -  Sodium 135 - 145 mmol/L 139 139 -  Potassium 3.5 - 5.1 mmol/L 3.3(L) 3.2(L) -  Chloride 98 - 111 mmol/L 102 98 -  CO2 22 - 32 mmol/L 26 25 -  Calcium 8.9 - 10.3 mg/dL 10.2 10.2 -  Total Protein 6.0 - 8.5 g/dL - - 8.2  Total Bilirubin 0.0 - 1.2 mg/dL - - -  Alkaline Phos 39 - 117 IU/L - - -  AST 0 - 40 IU/L - - -  ALT 0 - 32 IU/L - - -    Lab Results  Component Value Date   WBC 6.0 07/02/2019   HGB 13.0 07/02/2019   HCT 39.4 07/02/2019   MCV 90 07/02/2019   PLT 231 07/02/2019   NEUTROABS 2.1 07/02/2019    ASSESSMENT & PLAN:  Ductal carcinoma in situ (DCIS) of left breast 08/21/2019: Left lumpectomy Dr. Marlou Starks: Low-grade DCIS 0.4 cm, margins clear, ER 100%, PR 100% Tis NX stage 0  Pathology review: I discussed the final pathology report which showed it was low-grade DCIS.  We discussed the role of margins as well as the previously performed ER and PR testing.  Treatment plan: 1.  Adjuvant radiation therapy 2.  Followed by tamoxifen for 5 years  We discussed the risks and benefits of tamoxifen. These include but not limited to insomnia, hot flashes, mood changes, vaginal dryness, and weight gain. Although rare, serious side effects including endometrial cancer, risk of blood clots were also discussed. We strongly believe that the benefits far outweigh the risks. Patient understands these risks and consented to starting treatment. Planned treatment duration is 5 years.  Return to clinic after radiation to start tamoxifen.   No orders of the defined types were placed in this encounter.  The patient has a good understanding of the overall plan. she agrees with it. she will call with any problems that may develop before the next visit here.  Nicholas Lose, MD 08/29/2019  Julious Oka Dorshimer am acting as  scribe for Dr. Nicholas Lose.  I have reviewed the above documentation for accuracy and completeness, and I agree with the above.

## 2019-08-29 ENCOUNTER — Inpatient Hospital Stay: Payer: Medicare Other | Attending: Hematology and Oncology | Admitting: Hematology and Oncology

## 2019-08-29 ENCOUNTER — Other Ambulatory Visit: Payer: Self-pay

## 2019-08-29 DIAGNOSIS — Z7984 Long term (current) use of oral hypoglycemic drugs: Secondary | ICD-10-CM | POA: Insufficient documentation

## 2019-08-29 DIAGNOSIS — Z170 Estrogen receptor positive status [ER+]: Secondary | ICD-10-CM | POA: Insufficient documentation

## 2019-08-29 DIAGNOSIS — Z79899 Other long term (current) drug therapy: Secondary | ICD-10-CM | POA: Diagnosis not present

## 2019-08-29 DIAGNOSIS — D0512 Intraductal carcinoma in situ of left breast: Secondary | ICD-10-CM | POA: Insufficient documentation

## 2019-08-29 NOTE — Assessment & Plan Note (Signed)
08/21/2019: Left lumpectomy Dr. Marlou Starks: Low-grade DCIS 0.4 cm, margins clear, ER 100%, PR 100% Tis NX stage 0  Pathology review: I discussed the final pathology report which showed it was low-grade DCIS.  We discussed the role of margins as well as the previously performed ER and PR testing.  Treatment plan: 1.  Adjuvant radiation therapy 2.  Followed by tamoxifen for 5 years  Return to clinic after radiation to start tamoxifen.

## 2019-09-03 ENCOUNTER — Other Ambulatory Visit: Payer: Self-pay

## 2019-09-03 MED ORDER — SITAGLIPTIN PHOSPHATE 100 MG PO TABS: 100.0000 mg | ORAL_TABLET | Freq: Every day | ORAL | 1 refills | Status: DC

## 2019-09-05 ENCOUNTER — Ambulatory Visit
Admission: RE | Admit: 2019-09-05 | Discharge: 2019-09-05 | Disposition: A | Discharge status: Home / Self Care | Payer: Medicare Other | Source: Ambulatory Visit | Attending: Radiation Oncology | Admitting: Radiation Oncology

## 2019-09-05 ENCOUNTER — Other Ambulatory Visit: Payer: Self-pay

## 2019-09-05 DIAGNOSIS — D0512 Intraductal carcinoma in situ of left breast: Secondary | ICD-10-CM | POA: Diagnosis not present

## 2019-09-05 DIAGNOSIS — Z170 Estrogen receptor positive status [ER+]: Secondary | ICD-10-CM | POA: Diagnosis not present

## 2019-09-05 DIAGNOSIS — Z510 Encounter for antineoplastic radiation therapy: Secondary | ICD-10-CM | POA: Insufficient documentation

## 2019-09-05 DIAGNOSIS — Z9889 Other specified postprocedural states: Secondary | ICD-10-CM | POA: Diagnosis not present

## 2019-09-05 NOTE — Progress Notes (Addendum)
Radiation Oncology         [phone] ________________________________  Outpatient Follow Up - Conducted via telephone due to current COVID-19 concerns for limiting patient exposure  I spoke with the patient to conduct this consult visit via telephone to spare the patient unnecessary potential exposure in the healthcare setting during the current COVID-19 pandemic. The patient was notified in advance and was offered a [place] meeting to allow for face to face communication but unfortunately reported that they did not have the appropriate resources/technology to support such a visit and instead preferred to proceed with a telephone visit.  ________________________________  Name: Michele Wang        MRN: [id number]  Date of Service: 09/05/2019 DOB: 1951-11-01  ZD:[id number], Bailey Mech, MD  [name], MD     REFERRING PHYSICIAN: [name], MD   DIAGNOSIS: The encounter diagnosis was Ductal carcinoma in situ (DCIS) of left breast.   HISTORY OF PRESENT ILLNESS: Michele Wang is a 68 y.o. female with a history of left breast cancer. The patient was originally found to have suspicious calcifications measuring 6 Wang on a screening mammogram, further diagnostic imaging revealed these calcifications in the upper outer quadrant of the left breast and a biopsy on 07/10/2019 revealed an intermediate grade ER/PR positive DCIS.  She was taken to the operating room on 08/21/2019 where she underwent a left lumpectomy revealing a low-grade DCIS spanning 4 Wang with negative resection margins, fibrocystic and fibroadenomatous change with usual ductal hyperplasia.  An additional margin excision was also consistent with fibrocystic change.  No invasive component was noted.  She is seen today via my chart to discuss options of adjuvant therapy.  PREVIOUS RADIATION THERAPY: No   PAST MEDICAL HISTORY:  Past Medical History:  Diagnosis Date   Diabetes mellitus without complication ([place])    Type II    Hyperlipidemia    Hypertension    UTI (urinary tract infection)        PAST SURGICAL HISTORY: Past Surgical History:  Procedure Laterality Date   BREAST LUMPECTOMY WITH RADIOACTIVE SEED LOCALIZATION Left 08/21/2019   Procedure: LEFT BREAST LUMPECTOMY WITH RADIOACTIVE SEED LOCALIZATION;  Surgeon: Jovita Kussmaul, MD;  Location: [place];  Service: General;  Laterality: Left;   CESAREAN SECTION     x2     FAMILY HISTORY:  Family History  Problem Relation Age of Onset   Aneurysm Mother    Healthy Father      SOCIAL HISTORY:  reports that she has never smoked. She has never used smokeless tobacco. She reports that she does not drink alcohol or use drugs. The patient is married and lives in [place]. She is retired from [employer]. She has two adult children.   ALLERGIES: Patient has no known allergies.   MEDICATIONS:  Current Outpatient Medications  Medication Sig Dispense Refill   Ascorbic Acid (VITAMIN C) 1000 MG tablet Take 1,000 mg by mouth daily.     CALCIUM PO Take 1,000 mg by mouth daily.     HYDROcodone-acetaminophen (NORCO/VICODIN) 5-325 MG tablet Take 1-2 tablets by mouth every 6 (six) hours as needed for moderate pain or severe pain. 15 tablet 0   Olmesartan-amLODIPine-HCTZ (TRIBENZOR) 40-5-25 MG TABS Take 1 tablet by mouth daily. 90 tablet 2   sitaGLIPtin (JANUVIA) 100 MG tablet Take 1 tablet (100 mg total) by mouth daily. 90 tablet 1   Vitamin D, Cholecalciferol, 25 MCG (1000 UT) CAPS Take by mouth.     No current  facility-administered medications for this encounter.      REVIEW OF SYSTEMS: On review of systems, the patient reports that she is doing well overall. She has had some soreness and itching of the breast since surgery and this has improved. She denies any chest pain, shortness of breath, cough, fevers, chills, night sweats, unintended weight changes. She denies any bowel or bladder disturbances, and denies abdominal pain,  nausea or vomiting. She denies any new musculoskeletal or joint aches or pains. A complete review of systems is obtained and is otherwise negative.     PHYSICAL EXAM:  Wt Readings from Last 3 Encounters:  08/29/19 161 lb 11.2 oz (73.3 kg)  08/21/19 157 lb 3 oz (71.3 kg)  08/08/19 155 lb 9.6 oz (70.6 kg)  unable to assess due to encounter type.   ECOG = 0  0 - Asymptomatic (Fully active, able to carry on all predisease activities without restriction)  1 - Symptomatic but completely ambulatory (Restricted in physically strenuous activity but ambulatory and able to carry out work of a light or sedentary nature. For example, light housework, office work)  2 - Symptomatic, <50% in bed during the day (Ambulatory and capable of all self care but unable to carry out any work activities. Up and about more than 50% of waking hours)  3 - Symptomatic, >50% in bed, but not bedbound (Capable of only limited self-care, confined to bed or chair 50% or more of waking hours)  4 - Bedbound (Completely disabled. Cannot carry on any self-care. Totally confined to bed or chair)  5 - Death   Michele Wang, Creech RH, Tormey DC, et al. [phone]). "Toxicity and response criteria of the [facility]". [place] Oncol. 5 (6): 649-55    LABORATORY DATA:  Lab Results  Component Value Date   WBC 6.0 07/02/2019   HGB 13.0 07/02/2019   HCT 39.4 07/02/2019   MCV 90 07/02/2019   PLT 231 07/02/2019   Lab Results  Component Value Date   NA 139 08/16/2019   K 3.3 (L) 08/16/2019   CL 102 08/16/2019   CO2 26 08/16/2019   Lab Results  Component Value Date   ALT 31 07/02/2019   AST 29 07/02/2019   ALKPHOS 62 07/02/2019   BILITOT 0.6 07/02/2019      RADIOGRAPHY: Ct Abdomen Wo Contrast  Result Date: 08/28/2019 CLINICAL DATA:  Recurrent UTIs, acute kidney injury, type II diabetes mellitus, hypertension, history of LEFT breast cancer EXAM: CT ABDOMEN WITHOUT CONTRAST TECHNIQUE:  Multidetector CT imaging of the abdomen was performed following the standard protocol without IV contrast. Sagittal and coronal MPR images reconstructed from axial data set. Patient drank dilute oral contrast for exam. COMPARISON:  12/25/2016 FINDINGS: Lower chest: Minimal atelectasis at lung bases. Questionable focus of atelectasis versus infiltrate or developing nodule at base of lingula 11 x 7 Wang. Hepatobiliary: Gallbladder surgically absent.  Liver unremarkable. Pancreas: Normal appearance Spleen: Normal appearance Adrenals/Urinary Tract: Adrenal glands normal appearance. 3 Wang nonobstructing calculus mid RIGHT kidney. 5 Wang nonobstructing calculus at inferior pole LEFT kidney. No renal mass or hydronephrosis. Visualized ureters unremarkable. Distal ureters, bladder, and pelvis not imaged. Stomach/Bowel: Normal appendix. Stomach decompressed. Transverse colon decompressed unable to assess wall thickness accurately. Remaining visualized bowel loops unremarkable. Vascular/Lymphatic: Minimal atherosclerotic calcifications aorta and proximal RIGHT common iliac artery. No adenopathy. Other: No free air free fluid.  No hernia. Musculoskeletal: Degenerative disc disease changes at L3-L4 with minimal anterolisthesis and associated mild facet degenerative changes.  Additionally BILATERAL spondylolysis L3. IMPRESSION: Small BILATERAL nonobstructing renal calculi. Grade 1 anterolisthesis L3-L4 due to degenerative disc and facet disease changes as well as BILATERAL spondylolysis L3. Questionable atelectasis versus mass or infiltrate at base of lingula; follow-up CT chest recommended in 3 months to ensure resolution and exclude pulmonary neoplasm. Electronically Signed   By: Lavonia Dana M.D.   On: 08/28/2019 15:50       IMPRESSION/PLAN: 1. ER/PR positive intermediate grade DCIS of the left breast. Dr. Lisbeth Renshaw discusses the pathology findings and reviews the nature of noninvasive disease specifically to the left side.  He  reviews the rationale for adjuvant radiotherapy to reduce the risks of local recurrence, as well as antiestrogen therapy. We discussed the risks, benefits, short, and long term effects of radiotherapy, and the patient is interested in proceeding. Dr. Lisbeth Renshaw discusses the delivery and logistics of radiotherapy and he recommends a course of 4 weeks  of radiotherapy to the left breast with deep inspiration breath hold technique.  Verbal consent was obtained and she will come today for simulation where she was sign written consent to proceed. She would begin radiotherapy the week of 09/16/2019.   Given current concerns for patient exposure during the COVID-19 pandemic, this encounter was conducted via telephone.  The patient has given verbal consent for this type of encounter. The time spent during this encounter was 25 minutes and 50% of that time was spent in the coordination of her care. The attendants for this meeting include Dr. Lisbeth Renshaw, Mardene Sayer, LPN, Shona Simpson, [facility] and Nicola Police  During the encounter, Dr. Lisbeth Renshaw, Mardene Sayer, LPN, and Shona Simpson [facility] were located at [facility] Radiation Oncology Department.  VALRIE JIA  was located at home.   The above documentation reflects my direct findings during this shared patient visit. Please see the separate note by Dr. Lisbeth Renshaw on this date for the remainder of the patient's plan of care.    Carola Rhine, PAC

## 2019-09-05 NOTE — Progress Notes (Signed)
  Radiation Oncology         [phone] ________________________________  Name: Michele Wang MRN: [id number]  Date: 09/05/2019  DOB: [birth year]   DIAGNOSIS:     ICD-10-CM   1. Ductal carcinoma in situ (DCIS) of left breast  D05.12     SIMULATION AND TREATMENT PLANNING NOTE  The patient presented for simulation prior to beginning her course of radiation treatment for her diagnosis of left-sided breast cancer. The patient was placed in a supine position on a breast board. A customized vac-lock bag was constructed and this complex treatment device will be used on a daily basis during her treatment. In this fashion, a CT scan was obtained through the chest area and an isocenter was placed near the chest wall within the breast.  The patient will be planned to receive a course of radiation initially to a dose of 42.56 Gy. This will consist of a whole breast radiotherapy technique. To accomplish this, 2 customized blocks have been designed which will correspond to medial and lateral whole breast tangent fields. This treatment will be accomplished at 2.66 Gy per fraction. A forward planning technique will also be evaluated to determine if this approach improves the plan. It is anticipated that the patient will then receive a 8 Gy boost to the seroma cavity which has been contoured. This will be accomplished at 2 Gy per fraction.   This initial treatment will consist of a 3-D conformal technique. The seroma has been contoured as the primary target structure. Additionally, dose volume histograms of both this target as well as the lungs and heart will also be evaluated. Such an approach is necessary to ensure that the target area is adequately covered while the nearby critical  normal structures are adequately spared.  Plan:  The final anticipated total dose therefore will correspond to 50.56 Gy.   Special treatment procedure was performed today due to the extra time and effort required by myself to  plan and prepare this patient for deep inspiration breath hold technique.  I have determined cardiac sparing to be of benefit to this patient to prevent long term cardiac damage due to radiation of the heart.  Bellows were placed on the patient's abdomen. To facilitate cardiac sparing, the patient was coached by the radiation therapists on breath hold techniques and breathing practice was performed. Practice waveforms were obtained. The patient was then scanned while maintaining breath hold in the treatment position.  This image was then transferred over to the imaging specialist. The imaging specialist then created a fusion of the free breathing and breath hold scans using the chest wall as the stable structure. I personally reviewed the fusion in axial, coronal and sagittal image planes.  Excellent cardiac sparing was obtained.  I felt the patient is an appropriate candidate for breath hold and the patient will be treated as such.  The image fusion was then reviewed with the patient to reinforce the necessity of reproducible breath hold.     _______________________________   Jodelle Gross, MD, PhD

## 2019-09-05 NOTE — Progress Notes (Signed)
  Radiation Oncology         [phone] ________________________________  Name: Michele Wang MRN: [id number]  Date: 09/05/2019  DOB: 26-Dec-1950  Optical Surface Tracking Plan:  Since intensity modulated radiotherapy (IMRT) and 3D conformal radiation treatment methods are predicated on accurate and precise positioning for treatment, intrafraction motion monitoring is medically necessary to ensure accurate and safe treatment delivery.  The ability to quantify intrafraction motion without excessive ionizing radiation dose can only be performed with optical surface tracking. Accordingly, surface imaging offers the opportunity to obtain 3D measurements of patient position throughout IMRT and 3D treatments without excessive radiation exposure.  I am ordering optical surface tracking for this patient's upcoming course of radiotherapy. ________________________________  Kyung Rudd, MD 09/05/2019 6:32 PM    Reference:   Particia Jasper, et al. Surface imaging-based analysis of intrafraction motion for breast radiotherapy patients.Journal of [place], n. 6, nov. 2014. ISSN [id number].   Available at: <http://www.jacmp.org/index.php/jacmp/article/view/4957>.

## 2019-09-10 ENCOUNTER — Encounter: Payer: Self-pay | Admitting: Internal Medicine

## 2019-09-10 DIAGNOSIS — D0512 Intraductal carcinoma in situ of left breast: Secondary | ICD-10-CM | POA: Diagnosis not present

## 2019-09-10 DIAGNOSIS — Z510 Encounter for antineoplastic radiation therapy: Secondary | ICD-10-CM | POA: Diagnosis not present

## 2019-09-11 ENCOUNTER — Encounter: Payer: Self-pay | Admitting: *Deleted

## 2019-09-12 ENCOUNTER — Telehealth: Payer: Self-pay | Admitting: Hematology and Oncology

## 2019-09-12 NOTE — Telephone Encounter (Signed)
Scheduled appt per 10/14 sch message - mailed reminder letter with appt date and time

## 2019-09-16 ENCOUNTER — Ambulatory Visit
Admission: RE | Admit: 2019-09-16 | Discharge: 2019-09-16 | Disposition: A | Discharge status: Home / Self Care | Payer: Medicare Other | Source: Ambulatory Visit | Attending: Radiation Oncology | Admitting: Radiation Oncology

## 2019-09-16 DIAGNOSIS — D0512 Intraductal carcinoma in situ of left breast: Secondary | ICD-10-CM | POA: Diagnosis not present

## 2019-09-16 DIAGNOSIS — Z510 Encounter for antineoplastic radiation therapy: Secondary | ICD-10-CM | POA: Diagnosis not present

## 2019-09-17 ENCOUNTER — Ambulatory Visit
Admission: RE | Admit: 2019-09-17 | Discharge: 2019-09-17 | Disposition: A | Discharge status: Home / Self Care | Payer: Medicare Other | Source: Ambulatory Visit | Attending: Radiation Oncology | Admitting: Radiation Oncology

## 2019-09-17 ENCOUNTER — Other Ambulatory Visit: Payer: Self-pay

## 2019-09-17 DIAGNOSIS — D0512 Intraductal carcinoma in situ of left breast: Secondary | ICD-10-CM | POA: Diagnosis not present

## 2019-09-17 DIAGNOSIS — Z510 Encounter for antineoplastic radiation therapy: Secondary | ICD-10-CM | POA: Diagnosis not present

## 2019-09-18 ENCOUNTER — Other Ambulatory Visit: Payer: Self-pay

## 2019-09-18 ENCOUNTER — Ambulatory Visit
Admission: RE | Admit: 2019-09-18 | Discharge: 2019-09-18 | Disposition: A | Discharge status: Home / Self Care | Payer: Medicare Other | Source: Ambulatory Visit | Attending: Radiation Oncology | Admitting: Radiation Oncology

## 2019-09-18 ENCOUNTER — Encounter: Payer: Self-pay | Admitting: Internal Medicine

## 2019-09-18 DIAGNOSIS — Z510 Encounter for antineoplastic radiation therapy: Secondary | ICD-10-CM | POA: Diagnosis not present

## 2019-09-18 DIAGNOSIS — D0512 Intraductal carcinoma in situ of left breast: Secondary | ICD-10-CM | POA: Diagnosis not present

## 2019-09-18 MED ORDER — ALRA NON-METALLIC DEODORANT (RAD-ONC)
1.0000 "application " | Freq: Once | TOPICAL | Status: AC
  Administered 2019-09-18: 1 via TOPICAL

## 2019-09-18 MED ORDER — RADIAPLEXRX EX GEL
Freq: Once | CUTANEOUS | Status: AC
  Administered 2019-09-18: 14:00:00 via TOPICAL

## 2019-09-18 NOTE — Progress Notes (Signed)
Pt here for patient teaching.  Pt given Radiation and You booklet, skin care instructions, Alra deodorant and Radiaplex gel.  Reviewed areas of pertinence such as fatigue, hair loss, skin changes, breast tenderness and breast swelling . Pt able to give teach back of to pat skin and use unscented/gentle soap,apply Radiaplex bid, avoid applying anything to skin within 4 hours of treatment, avoid wearing an under wire bra and to use an electric razor if they must shave. Pt verbalizes understanding of information given and will contact nursing with any questions or concerns.     Tavionna Grout M. Nneoma Harral RN, BSN      

## 2019-09-19 ENCOUNTER — Other Ambulatory Visit: Payer: Self-pay

## 2019-09-19 ENCOUNTER — Ambulatory Visit
Admission: RE | Admit: 2019-09-19 | Discharge: 2019-09-19 | Disposition: A | Discharge status: Home / Self Care | Payer: Medicare Other | Source: Ambulatory Visit | Attending: Radiation Oncology | Admitting: Radiation Oncology

## 2019-09-19 DIAGNOSIS — Z510 Encounter for antineoplastic radiation therapy: Secondary | ICD-10-CM | POA: Diagnosis not present

## 2019-09-19 DIAGNOSIS — D0512 Intraductal carcinoma in situ of left breast: Secondary | ICD-10-CM | POA: Diagnosis not present

## 2019-09-20 ENCOUNTER — Ambulatory Visit
Admission: RE | Admit: 2019-09-20 | Discharge: 2019-09-20 | Disposition: A | Discharge status: Home / Self Care | Payer: Medicare Other | Source: Ambulatory Visit | Attending: Radiation Oncology | Admitting: Radiation Oncology

## 2019-09-20 ENCOUNTER — Other Ambulatory Visit: Payer: Self-pay

## 2019-09-20 DIAGNOSIS — Z510 Encounter for antineoplastic radiation therapy: Secondary | ICD-10-CM | POA: Diagnosis not present

## 2019-09-20 DIAGNOSIS — D0512 Intraductal carcinoma in situ of left breast: Secondary | ICD-10-CM | POA: Diagnosis not present

## 2019-09-23 ENCOUNTER — Other Ambulatory Visit: Payer: Self-pay

## 2019-09-23 ENCOUNTER — Ambulatory Visit
Admission: RE | Admit: 2019-09-23 | Discharge: 2019-09-23 | Disposition: A | Discharge status: Home / Self Care | Payer: Medicare Other | Source: Ambulatory Visit | Attending: Radiation Oncology | Admitting: Radiation Oncology

## 2019-09-23 DIAGNOSIS — D0512 Intraductal carcinoma in situ of left breast: Secondary | ICD-10-CM | POA: Diagnosis not present

## 2019-09-23 DIAGNOSIS — Z510 Encounter for antineoplastic radiation therapy: Secondary | ICD-10-CM | POA: Diagnosis not present

## 2019-09-24 ENCOUNTER — Ambulatory Visit
Admission: RE | Admit: 2019-09-24 | Discharge: 2019-09-24 | Disposition: A | Discharge status: Home / Self Care | Payer: Medicare Other | Source: Ambulatory Visit | Attending: Radiation Oncology | Admitting: Radiation Oncology

## 2019-09-24 ENCOUNTER — Other Ambulatory Visit: Payer: Self-pay

## 2019-09-24 DIAGNOSIS — Z510 Encounter for antineoplastic radiation therapy: Secondary | ICD-10-CM | POA: Diagnosis not present

## 2019-09-24 DIAGNOSIS — D0512 Intraductal carcinoma in situ of left breast: Secondary | ICD-10-CM | POA: Diagnosis not present

## 2019-09-25 ENCOUNTER — Other Ambulatory Visit: Payer: Self-pay

## 2019-09-25 ENCOUNTER — Ambulatory Visit
Admission: RE | Admit: 2019-09-25 | Discharge: 2019-09-25 | Disposition: A | Discharge status: Home / Self Care | Payer: Medicare Other | Source: Ambulatory Visit | Attending: Radiation Oncology | Admitting: Radiation Oncology

## 2019-09-25 DIAGNOSIS — D0512 Intraductal carcinoma in situ of left breast: Secondary | ICD-10-CM | POA: Diagnosis not present

## 2019-09-25 DIAGNOSIS — Z510 Encounter for antineoplastic radiation therapy: Secondary | ICD-10-CM | POA: Diagnosis not present

## 2019-09-26 ENCOUNTER — Other Ambulatory Visit: Payer: Self-pay

## 2019-09-26 ENCOUNTER — Ambulatory Visit
Admission: RE | Admit: 2019-09-26 | Discharge: 2019-09-26 | Disposition: A | Discharge status: Home / Self Care | Payer: Medicare Other | Source: Ambulatory Visit | Attending: Radiation Oncology | Admitting: Radiation Oncology

## 2019-09-26 DIAGNOSIS — Z510 Encounter for antineoplastic radiation therapy: Secondary | ICD-10-CM | POA: Diagnosis not present

## 2019-09-26 DIAGNOSIS — D0512 Intraductal carcinoma in situ of left breast: Secondary | ICD-10-CM | POA: Diagnosis not present

## 2019-09-27 ENCOUNTER — Other Ambulatory Visit: Payer: Self-pay

## 2019-09-27 ENCOUNTER — Ambulatory Visit
Admission: RE | Admit: 2019-09-27 | Discharge: 2019-09-27 | Disposition: A | Discharge status: Home / Self Care | Payer: Medicare Other | Source: Ambulatory Visit | Attending: Radiation Oncology | Admitting: Radiation Oncology

## 2019-09-27 DIAGNOSIS — D0512 Intraductal carcinoma in situ of left breast: Secondary | ICD-10-CM | POA: Diagnosis not present

## 2019-09-27 DIAGNOSIS — Z510 Encounter for antineoplastic radiation therapy: Secondary | ICD-10-CM | POA: Diagnosis not present

## 2019-09-30 ENCOUNTER — Ambulatory Visit
Admission: RE | Admit: 2019-09-30 | Discharge: 2019-09-30 | Disposition: A | Discharge status: Home / Self Care | Payer: Medicare Other | Source: Ambulatory Visit | Attending: Radiation Oncology | Admitting: Radiation Oncology

## 2019-09-30 ENCOUNTER — Other Ambulatory Visit: Payer: Self-pay

## 2019-09-30 DIAGNOSIS — D0512 Intraductal carcinoma in situ of left breast: Secondary | ICD-10-CM | POA: Diagnosis not present

## 2019-09-30 DIAGNOSIS — Z510 Encounter for antineoplastic radiation therapy: Secondary | ICD-10-CM | POA: Diagnosis not present

## 2019-10-01 ENCOUNTER — Other Ambulatory Visit: Payer: Self-pay

## 2019-10-01 ENCOUNTER — Ambulatory Visit
Admission: RE | Admit: 2019-10-01 | Discharge: 2019-10-01 | Disposition: A | Discharge status: Home / Self Care | Payer: Medicare Other | Source: Ambulatory Visit | Attending: Radiation Oncology | Admitting: Radiation Oncology

## 2019-10-01 DIAGNOSIS — Z510 Encounter for antineoplastic radiation therapy: Secondary | ICD-10-CM | POA: Diagnosis not present

## 2019-10-01 DIAGNOSIS — D0512 Intraductal carcinoma in situ of left breast: Secondary | ICD-10-CM | POA: Diagnosis not present

## 2019-10-02 ENCOUNTER — Ambulatory Visit
Admission: RE | Admit: 2019-10-02 | Discharge: 2019-10-02 | Disposition: A | Discharge status: Home / Self Care | Payer: Medicare Other | Source: Ambulatory Visit | Attending: Radiation Oncology | Admitting: Radiation Oncology

## 2019-10-02 ENCOUNTER — Other Ambulatory Visit: Payer: Self-pay

## 2019-10-02 DIAGNOSIS — D0512 Intraductal carcinoma in situ of left breast: Secondary | ICD-10-CM | POA: Diagnosis not present

## 2019-10-02 DIAGNOSIS — Z510 Encounter for antineoplastic radiation therapy: Secondary | ICD-10-CM | POA: Diagnosis not present

## 2019-10-03 ENCOUNTER — Ambulatory Visit
Admission: RE | Admit: 2019-10-03 | Discharge: 2019-10-03 | Disposition: A | Discharge status: Home / Self Care | Payer: Medicare Other | Source: Ambulatory Visit | Attending: Radiation Oncology | Admitting: Radiation Oncology

## 2019-10-03 ENCOUNTER — Other Ambulatory Visit: Payer: Self-pay

## 2019-10-03 DIAGNOSIS — D0512 Intraductal carcinoma in situ of left breast: Secondary | ICD-10-CM | POA: Diagnosis not present

## 2019-10-03 DIAGNOSIS — Z510 Encounter for antineoplastic radiation therapy: Secondary | ICD-10-CM | POA: Diagnosis not present

## 2019-10-04 ENCOUNTER — Ambulatory Visit: Payer: Medicare Other | Admitting: Radiation Oncology

## 2019-10-04 ENCOUNTER — Other Ambulatory Visit: Payer: Self-pay

## 2019-10-04 ENCOUNTER — Ambulatory Visit
Admission: RE | Admit: 2019-10-04 | Discharge: 2019-10-04 | Disposition: A | Discharge status: Home / Self Care | Payer: Medicare Other | Source: Ambulatory Visit | Attending: Radiation Oncology | Admitting: Radiation Oncology

## 2019-10-04 DIAGNOSIS — D0512 Intraductal carcinoma in situ of left breast: Secondary | ICD-10-CM | POA: Diagnosis not present

## 2019-10-04 DIAGNOSIS — Z510 Encounter for antineoplastic radiation therapy: Secondary | ICD-10-CM | POA: Diagnosis not present

## 2019-10-06 NOTE — Progress Notes (Signed)
Patient Care Team: [name], MD as PCP - General (Internal Medicine)  DIAGNOSIS:    ICD-10-CM   1. Ductal carcinoma in situ (DCIS) of left breast  D05.12     SUMMARY OF ONCOLOGIC HISTORY: Oncology History  Ductal carcinoma in situ (DCIS) of left breast  07/10/2019 Cancer Staging   Staging form: Breast, AJCC 8th Edition - Clinical stage from 07/10/2019: Stage 0 (cTis (DCIS), cN0, cM0, ER+, PR+) - Signed by Gardenia Phlegm, NP on 07/17/2019   07/17/2019 Initial Diagnosis   Routine screening mammogram detected a 0.6cm area of calcifications in the upper outer left breast. Biopsy showed DCIS with calcifications, intermediate grade, ER+100%, PR+ 100% positive.   08/21/2019 Surgery   Left lumpectomy Marlou Starks): low grade DCIS spanning 0.4cm, clear margins, and no invasive carcinoma     CHIEF COMPLIANT: Follow-up of left breast DCIS to discuss anti-estrogen therapy  INTERVAL HISTORY: Michele Wang is a 68 y.o. with above-mentioned history of left breast DCIS for which she underwent a lumpectomy and is currently undergoing radiation. She presents to the clinic today to discuss anti-estrogen therapy.   REVIEW OF SYSTEMS:    Constitutional: Denies fevers, chills or abnormal weight loss Eyes: Denies blurriness of vision Ears, nose, mouth, throat, and face: Denies mucositis or sore throat Respiratory: Denies cough, dyspnea or wheezes Cardiovascular: Denies palpitation, chest discomfort Gastrointestinal: Denies nausea, heartburn or change in bowel habits Skin: Denies abnormal skin rashes Lymphatics: Denies new lymphadenopathy or easy bruising Neurological: Denies numbness, tingling or new weaknesses Behavioral/Psych: Mood is stable, no new changes  Extremities: No lower extremity edema Breast: denies any pain or lumps or nodules in either breasts All other systems were reviewed with the patient and are negative.  I have reviewed the past medical history, past surgical history,  social history and family history with the patient and they are unchanged from previous note.  ALLERGIES:  has No Known Allergies.  MEDICATIONS:  Current Outpatient Medications  Medication Sig Dispense Refill   Ascorbic Acid (VITAMIN C) 1000 MG tablet Take 1,000 mg by mouth daily.     CALCIUM PO Take 1,000 mg by mouth daily.     HYDROcodone-acetaminophen (NORCO/VICODIN) 5-325 MG tablet Take 1-2 tablets by mouth every 6 (six) hours as needed for moderate pain or severe pain. 15 tablet 0   Olmesartan-amLODIPine-HCTZ (TRIBENZOR) 40-5-25 MG TABS Take 1 tablet by mouth daily. 90 tablet 2   sitaGLIPtin (JANUVIA) 100 MG tablet Take 1 tablet (100 mg total) by mouth daily. 90 tablet 1   Vitamin D, Cholecalciferol, 25 MCG (1000 UT) CAPS Take by mouth.     No current facility-administered medications for this visit.     PHYSICAL EXAMINATION: ECOG PERFORMANCE STATUS: 1 - Symptomatic but completely ambulatory  Vitals:   10/07/19 1037  BP: 131/85  Pulse: 77  Resp: 18  Temp: 98.5 F (36.9 C)  SpO2: 100%   Filed Weights   10/07/19 1037  Weight: 162 lb 14.4 oz (73.9 kg)    GENERAL: alert, no distress and comfortable SKIN: skin color, texture, turgor are normal, no rashes or significant lesions EYES: normal, Conjunctiva are pink and non-injected, sclera clear OROPHARYNX: no exudate, no erythema and lips, buccal mucosa, and tongue normal  NECK: supple, thyroid normal size, non-tender, without nodularity LYMPH: no palpable lymphadenopathy in the cervical, axillary or inguinal LUNGS: clear to auscultation and percussion with normal breathing effort HEART: regular rate & rhythm and no murmurs and no lower extremity edema ABDOMEN: abdomen soft, non-tender  and normal bowel sounds MUSCULOSKELETAL: no cyanosis of digits and no clubbing  NEURO: alert & oriented x 3 with fluent speech, no focal motor/sensory deficits EXTREMITIES: No lower extremity edema  LABORATORY DATA:  I have reviewed  the data as listed CMP Latest Ref Rng & Units 08/16/2019 07/05/2019 07/02/2019  Glucose 70 - 99 mg/dL 110(H) 124(H) -  BUN 8 - 23 mg/dL 21 26 -  Creatinine 0.44 - 1.00 mg/dL 1.61(H) 1.81(H) -  Sodium 135 - 145 mmol/L 139 139 -  Potassium 3.5 - 5.1 mmol/L 3.3(L) 3.2(L) -  Chloride 98 - 111 mmol/L 102 98 -  CO2 22 - 32 mmol/L 26 25 -  Calcium 8.9 - 10.3 mg/dL 10.2 10.2 -  Total Protein 6.0 - 8.5 g/dL - - 8.2  Total Bilirubin 0.0 - 1.2 mg/dL - - -  Alkaline Phos 39 - 117 IU/L - - -  AST 0 - 40 IU/L - - -  ALT 0 - 32 IU/L - - -    Lab Results  Component Value Date   WBC 6.0 07/02/2019   HGB 13.0 07/02/2019   HCT 39.4 07/02/2019   MCV 90 07/02/2019   PLT 231 07/02/2019   NEUTROABS 2.1 07/02/2019    ASSESSMENT & PLAN:  Ductal carcinoma in situ (DCIS) of left breast 08/21/2019: Left lumpectomy Dr. Marlou Starks: Low-grade DCIS 0.4 cm, margins clear, ER 100%, PR 100% Tis NX stage 0  Treatment plan: 1.  Adjuvant radiation therapy 09/17/2019-10/07/2019 2.  Followed by tamoxifen for 5 years -------------------------------------------------------------------------------------------------------------------------------------------- Since patient completed adjuvant radiation, she will start adjuvant tamoxifen. I once again counseled extensively about tamoxifen toxicities today. She will start at 10 mg daily for a month and then increase it to 20 mg daily. I sent the prescription to the [facility] clinic.  Return to clinic in 3 months for survivorship care plan visit    No orders of the defined types were placed in this encounter.  The patient has a good understanding of the overall plan. she agrees with it. she will call with any problems that may develop before the next visit here.  Nicholas Lose, MD 10/07/2019  Julious Oka Dorshimer am acting as scribe for Dr. Nicholas Lose.  I have reviewed the above documentation for accuracy and completeness, and I agree with the above.

## 2019-10-07 ENCOUNTER — Ambulatory Visit
Admission: RE | Admit: 2019-10-07 | Discharge: 2019-10-07 | Disposition: A | Discharge status: Home / Self Care | Payer: Medicare Other | Source: Ambulatory Visit | Attending: Radiation Oncology | Admitting: Radiation Oncology

## 2019-10-07 ENCOUNTER — Inpatient Hospital Stay: Payer: Medicare Other | Attending: Hematology and Oncology | Admitting: Hematology and Oncology

## 2019-10-07 ENCOUNTER — Other Ambulatory Visit: Payer: Self-pay

## 2019-10-07 DIAGNOSIS — Z510 Encounter for antineoplastic radiation therapy: Secondary | ICD-10-CM | POA: Diagnosis not present

## 2019-10-07 DIAGNOSIS — D0512 Intraductal carcinoma in situ of left breast: Secondary | ICD-10-CM | POA: Insufficient documentation

## 2019-10-07 DIAGNOSIS — Z170 Estrogen receptor positive status [ER+]: Secondary | ICD-10-CM | POA: Diagnosis not present

## 2019-10-07 DIAGNOSIS — Z7984 Long term (current) use of oral hypoglycemic drugs: Secondary | ICD-10-CM | POA: Diagnosis not present

## 2019-10-07 DIAGNOSIS — Z923 Personal history of irradiation: Secondary | ICD-10-CM | POA: Insufficient documentation

## 2019-10-07 DIAGNOSIS — Z79899 Other long term (current) drug therapy: Secondary | ICD-10-CM | POA: Insufficient documentation

## 2019-10-07 MED ORDER — TAMOXIFEN CITRATE 20 MG PO TABS: 20.0000 mg | ORAL_TABLET | Freq: Every day | ORAL | 3 refills | Status: DC

## 2019-10-07 NOTE — Assessment & Plan Note (Signed)
08/21/2019: Left lumpectomy Dr. Marlou Starks: Low-grade DCIS 0.4 cm, margins clear, ER 100%, PR 100% Tis NX stage 0  Treatment plan: 1.  Adjuvant radiation therapy 09/17/2019-10/07/2019 2.  Followed by tamoxifen for 5 years -------------------------------------------------------------------------------------------------------------------------------------------- Since patient completed adjuvant radiation, she will start adjuvant tamoxifen. We have previously counseled her extensively about tamoxifen toxicities.  Return to clinic in 3 months for survivorship care plan visit

## 2019-10-08 ENCOUNTER — Ambulatory Visit
Admission: RE | Admit: 2019-10-08 | Discharge: 2019-10-08 | Disposition: A | Discharge status: Home / Self Care | Payer: Medicare Other | Source: Ambulatory Visit | Attending: Radiation Oncology | Admitting: Radiation Oncology

## 2019-10-08 ENCOUNTER — Other Ambulatory Visit: Payer: Self-pay

## 2019-10-08 ENCOUNTER — Telehealth: Payer: Self-pay | Admitting: Hematology and Oncology

## 2019-10-08 DIAGNOSIS — Z510 Encounter for antineoplastic radiation therapy: Secondary | ICD-10-CM | POA: Diagnosis not present

## 2019-10-08 DIAGNOSIS — D0512 Intraductal carcinoma in situ of left breast: Secondary | ICD-10-CM | POA: Diagnosis not present

## 2019-10-08 NOTE — Telephone Encounter (Signed)
I talk with patient regarding schedule  

## 2019-10-09 ENCOUNTER — Ambulatory Visit
Admission: RE | Admit: 2019-10-09 | Discharge: 2019-10-09 | Disposition: A | Discharge status: Home / Self Care | Payer: Medicare Other | Source: Ambulatory Visit | Attending: Radiation Oncology | Admitting: Radiation Oncology

## 2019-10-09 ENCOUNTER — Other Ambulatory Visit: Payer: Self-pay

## 2019-10-09 DIAGNOSIS — D0512 Intraductal carcinoma in situ of left breast: Secondary | ICD-10-CM | POA: Diagnosis not present

## 2019-10-09 DIAGNOSIS — Z510 Encounter for antineoplastic radiation therapy: Secondary | ICD-10-CM | POA: Diagnosis not present

## 2019-10-10 ENCOUNTER — Ambulatory Visit
Admission: RE | Admit: 2019-10-10 | Discharge: 2019-10-10 | Disposition: A | Discharge status: Home / Self Care | Payer: Medicare Other | Source: Ambulatory Visit | Attending: Radiation Oncology | Admitting: Radiation Oncology

## 2019-10-10 ENCOUNTER — Other Ambulatory Visit: Payer: Self-pay

## 2019-10-10 DIAGNOSIS — Z510 Encounter for antineoplastic radiation therapy: Secondary | ICD-10-CM | POA: Diagnosis not present

## 2019-10-10 DIAGNOSIS — D0512 Intraductal carcinoma in situ of left breast: Secondary | ICD-10-CM | POA: Diagnosis not present

## 2019-10-11 ENCOUNTER — Encounter: Payer: Self-pay | Admitting: *Deleted

## 2019-10-11 ENCOUNTER — Other Ambulatory Visit: Payer: Self-pay

## 2019-10-11 ENCOUNTER — Encounter: Payer: Self-pay | Admitting: Radiation Oncology

## 2019-10-11 ENCOUNTER — Ambulatory Visit
Admission: RE | Admit: 2019-10-11 | Discharge: 2019-10-11 | Disposition: A | Discharge status: Home / Self Care | Payer: Medicare Other | Source: Ambulatory Visit | Attending: Radiation Oncology | Admitting: Radiation Oncology

## 2019-10-11 DIAGNOSIS — D0512 Intraductal carcinoma in situ of left breast: Secondary | ICD-10-CM | POA: Diagnosis not present

## 2019-10-11 DIAGNOSIS — Z510 Encounter for antineoplastic radiation therapy: Secondary | ICD-10-CM | POA: Diagnosis not present

## 2019-11-01 ENCOUNTER — Encounter: Payer: Self-pay | Admitting: Internal Medicine

## 2019-11-06 ENCOUNTER — Ambulatory Visit (INDEPENDENT_AMBULATORY_CARE_PROVIDER_SITE_OTHER): Payer: Medicare Other | Admitting: Internal Medicine

## 2019-11-06 ENCOUNTER — Other Ambulatory Visit: Payer: Self-pay

## 2019-11-06 ENCOUNTER — Encounter: Payer: Self-pay | Admitting: Internal Medicine

## 2019-11-06 VITALS — BP 136/88 | HR 61 | Temp 98.2°F | Ht 65.0 in | Wt 165.0 lb

## 2019-11-06 DIAGNOSIS — E7800 Pure hypercholesterolemia, unspecified: Secondary | ICD-10-CM | POA: Diagnosis not present

## 2019-11-06 DIAGNOSIS — I129 Hypertensive chronic kidney disease with stage 1 through stage 4 chronic kidney disease, or unspecified chronic kidney disease: Secondary | ICD-10-CM

## 2019-11-06 DIAGNOSIS — Z23 Encounter for immunization: Secondary | ICD-10-CM

## 2019-11-06 DIAGNOSIS — E1122 Type 2 diabetes mellitus with diabetic chronic kidney disease: Secondary | ICD-10-CM | POA: Diagnosis not present

## 2019-11-06 DIAGNOSIS — N183 Chronic kidney disease, stage 3 unspecified: Secondary | ICD-10-CM | POA: Diagnosis not present

## 2019-11-06 DIAGNOSIS — E663 Overweight: Secondary | ICD-10-CM

## 2019-11-06 NOTE — Progress Notes (Signed)
This visit occurred during the SARS-CoV-2 public health emergency.  Safety protocols were in place, including screening questions prior to the visit, additional usage of staff PPE, and extensive cleaning of exam room while observing appropriate contact time as indicated for disinfecting solutions.  Subjective:     Patient ID: Michele Wang , female    DOB: 07-Feb-1951 , 68 y.o.   MRN: [id number]   Chief Complaint  Patient presents with  . Diabetes  . Hypertension    HPI  She is here today for DM check.  She recently stopped Janumet due to worsening renal function.  She reports her sugars have been stable.   Diabetes She presents for her follow-up diabetic visit. She has type 2 diabetes mellitus. There are no hypoglycemic associated symptoms. There are no diabetic associated symptoms. Pertinent negatives for diabetes include no blurred vision and no chest pain. There are no hypoglycemic complications. Diabetic complications include nephropathy. She is following a diabetic diet. She participates in exercise intermittently.  Hypertension This is a chronic problem. The current episode started more than 1 year ago. The problem has been gradually improving since onset. The problem is controlled. Pertinent negatives include no blurred vision, chest pain or shortness of breath. Past treatments include angiotensin blockers, diuretics and calcium channel blockers. The current treatment provides moderate improvement.     Past Medical History:  Diagnosis Date  . Diabetes mellitus without complication ([place])    Type II  . Hyperlipidemia   . Hypertension   . UTI (urinary tract infection)      Family History  Problem Relation Age of Onset  . Aneurysm Mother   . Healthy Father      Current Outpatient Medications:  .  Olmesartan-amLODIPine-HCTZ (TRIBENZOR) 40-5-25 MG TABS, Take 1 tablet by mouth daily., Disp: 90 tablet, Rfl: 2 .  sitaGLIPtin (JANUVIA) 100 MG tablet, Take 1 tablet (100 mg total)  by mouth daily., Disp: 90 tablet, Rfl: 1 .  tamoxifen (NOLVADEX) 20 MG tablet, Take 1 tablet (20 mg total) by mouth daily., Disp: 90 tablet, Rfl: 3 .  Vitamin D, Cholecalciferol, 25 MCG (1000 UT) CAPS, Take by mouth., Disp: , Rfl:  .  Ascorbic Acid (VITAMIN C) 1000 MG tablet, Take 1,000 mg by mouth daily., Disp: , Rfl:  .  CALCIUM PO, Take 1,000 mg by mouth daily., Disp: , Rfl:    No Known Allergies   Review of Systems  Constitutional: Negative.   Eyes: Negative for blurred vision.  Respiratory: Negative.  Negative for shortness of breath.   Cardiovascular: Negative.  Negative for chest pain.  Gastrointestinal: Negative.   Neurological: Negative.   Psychiatric/Behavioral: Negative.      Today's Vitals   11/06/19 0909  BP: 136/88  Pulse: 61  Temp: 98.2 F (36.8 C)  TempSrc: Oral  Weight: 165 lb (74.8 kg)  Height: _0  (1.651 m)   Body mass index is 27.46 kg/m.   Objective:  Physical Exam Vitals signs and nursing note reviewed.  Constitutional:      Appearance: Normal appearance.  HENT:     Head: Normocephalic and atraumatic.  Cardiovascular:     Rate and Rhythm: Normal rate and regular rhythm.     Heart sounds: Normal heart sounds.  Pulmonary:     Effort: Pulmonary effort is normal.     Breath sounds: Normal breath sounds.  Skin:    General: Skin is warm.  Neurological:     General: No focal deficit present.  Mental Status: She is alert.  Psychiatric:        Mood and Affect: Mood normal.        Behavior: Behavior normal.         Assessment And Plan:     1. Type 2 diabetes mellitus with stage 3 chronic kidney disease, without long-term current use of insulin, unspecified whether stage 3a or 3b CKD ([place])  I will check labs as listed below. Importance of regular exercise was discussed with the patient. I will adjust meds as needed.   - CMP14+EGFR - Hemoglobin A1c - Lipid panel  2. Hypertensive nephropathy  Chronic, fair control. She will continue  with current meds for now. She is encouraged to avoid adding salt to her foods, take meds as prescribed AND increase her daily activity.   3. Pure hypercholesterolemia  Chronic. I will check fasting lipid panel. Importance of statin compliance was discussed with the patient. She is encouraged to avoid fried foods and to incorporate more exercise into her daily routine.   4. Need for vaccination  - Flu vaccine HIGH DOSE PF (Fluzone High dose)  5. Overweight (BMI 25.0-29.9)  BMI 27. Her weight is stable.            [name], MD    THE PATIENT IS ENCOURAGED TO PRACTICE SOCIAL DISTANCING DUE TO THE COVID-19 PANDEMIC.

## 2019-11-06 NOTE — Patient Instructions (Signed)
Living With Diabetes Diabetes (type 1 diabetes mellitus or type 2 diabetes mellitus) is a condition in which the body does not have enough of a hormone called insulin, or the body does not respond properly to insulin. Normally, insulin allows sugars (glucose) to enter cells in the body. The cells use glucose for energy. With diabetes, extra glucose builds up in the blood instead of going into cells, which results in high blood glucose (hyperglycemia). How to manage lifestyle changes Managing diabetes includes medical treatments as well as lifestyle changes. If diabetes is not managed well, serious physical and emotional complications can occur. Taking good care of yourself means that you are responsible for:  Monitoring glucose regularly.  Eating a healthy diet.  Exercising regularly.  Meeting with health care providers.  Taking medicines as directed. Some people may feel a lot of stress about managing their diabetes. This is known as emotional distress, and it is very common. Living with diabetes can place you at risk for emotional distress, depression, or anxiety. These disorders can be confusing and can make diabetes management more difficult. How to recognize stress Emotional distress Symptoms of emotional distress include:  Anger about having a diagnosis of diabetes.  Fear or frustration about your diagnosis and the changes you need to make to manage the condition.  Being overly worried about the care that you need or the cost of the care that you need.  Feeling like you caused your condition by doing something wrong.  Fear of unpredictable situations, like low or high blood glucose.  Feeling judged by your health care providers.  Feeling very alone with the disease.  Getting too tired or worn out with the demands of daily care. Depression Having diabetes means that you are at a higher risk for depression. Having depression also means that you are at a higher risk for  diabetes. Your health care provider may test (screen) you for symptoms of depression. It is important to recognize depression symptoms and to start treatment for depression soon after it is diagnosed. The following are some symptoms of depression:  Loss of interest in things that you used to enjoy.  Trouble sleeping, or often waking up early and not being able to get back to sleep.  A change in appetite.  Feeling tired most of the day.  Feeling nervous and anxious.  Feeling guilty and worrying that you are a burden to others.  Feeling depressed more often than you do not feel that way.  Thoughts of hurting yourself or feeling that you want to die. If you have any of these symptoms for 2 weeks or longer, reach out to a health care provider. Follow these instructions at home: Managing emotional distress The following are some ways to manage emotional distress:  Talk with your health care provider or certified diabetes educator. Consider working with a counselor or therapist.  Learn as much as you can about diabetes and its treatment. Meet with a certified diabetes educator or take a class to learn how to manage your condition.  Keep a journal of your thoughts and concerns.  Accept that some things are out of your control.  Talk with other people who have diabetes. It can help to talk with others about the emotional distress that you feel.  Find ways to manage stress that work for you. These may include art or music therapy, exercise, meditation, and hobbies.  Seek support from spiritual leaders, family, and friends. General instructions  Follow your diabetes management plan.  Keep all follow-up visits as told by your health care provider. This is important. Where to find support   Ask your health care provider to recommend a therapist who understands both depression and diabetes.  Search for information and support from the American Diabetes Association:  www.diabetes.org  Find a certified diabetes educator and make an appointment through [place] of Diabetes Educators: www.diabeteseducator.org Get help right away if:  You have thoughts about hurting yourself or others. If you ever feel like you may hurt yourself or others, or have thoughts about taking your own life, get help right away. You can go to your nearest emergency department or call:  Your local emergency services (911 in the [place]  A suicide crisis helpline, such as the [place] at (650) 002-0027. This is open 24 hours a day. Summary  Diabetes (type 1 diabetes mellitus or type 2 diabetes mellitus) is a condition in which the body does not have enough of a hormone called insulin, or the body does not respond properly to insulin.  Living with diabetes puts you at risk for medical issues, and it also puts you at risk for emotional issues such as emotional distress, depression, and anxiety.  Recognizing the symptoms of emotional distress and depression may help you avoid problems with your diabetes control. It is important to start treatment for emotional distress and depression soon after they are diagnosed.  Having diabetes means that you are at a higher risk for depression. Ask your health care provider to recommend a therapist who understands both depression and diabetes.  If you experience symptoms of emotional distress or depression, it is important to discuss this with your health care provider, certified diabetes educator, or therapist. This information is not intended to replace advice given to you by your health care provider. Make sure you discuss any questions you have with your health care provider. Document Released: 03/30/2017 Document Revised: 11/26/2018 Document Reviewed: 03/30/2017 Elsevier Patient Education  2020 [employer].

## 2019-11-07 LAB — CMP14+EGFR
ALT: 31 IU/L (ref 0–32)
AST: 26 IU/L (ref 0–40)
Albumin/Globulin Ratio: 1.5 (ref 1.2–2.2)
Albumin: 4.4 g/dL (ref 3.8–4.8)
Alkaline Phosphatase: 69 IU/L (ref 39–117)
BUN/Creatinine Ratio: 13 (ref 12–28)
BUN: 24 mg/dL (ref 8–27)
Bilirubin Total: 0.3 mg/dL (ref 0.0–1.2)
CO2: 27 mmol/L (ref 20–29)
Calcium: 9.7 mg/dL (ref 8.7–10.3)
Chloride: 102 mmol/L (ref 96–106)
Creatinine, Ser: 1.81 mg/dL — ABNORMAL HIGH (ref 0.57–1.00)
GFR calc Af Amer: 33 mL/min/{1.73_m2} — ABNORMAL LOW (ref >59)
GFR calc non Af Amer: 28 mL/min/{1.73_m2} — ABNORMAL LOW (ref >59)
Globulin, Total: 2.9 g/dL (ref 1.5–4.5)
Glucose: 131 mg/dL — ABNORMAL HIGH (ref 65–99)
Potassium: 4.2 mmol/L (ref 3.5–5.2)
Sodium: 141 mmol/L (ref 134–144)
Total Protein: 7.3 g/dL (ref 6.0–8.5)

## 2019-11-07 LAB — LIPID PANEL
Chol/HDL Ratio: 5.2 ratio — ABNORMAL HIGH (ref 0.0–4.4)
Cholesterol, Total: 291 mg/dL — ABNORMAL HIGH (ref 100–199)
HDL: 56 mg/dL (ref >39)
LDL Chol Calc (NIH): 206 mg/dL — ABNORMAL HIGH (ref 0–99)
Triglycerides: 157 mg/dL — ABNORMAL HIGH (ref 0–149)
VLDL Cholesterol Cal: 29 mg/dL (ref 5–40)

## 2019-11-07 LAB — HEMOGLOBIN A1C
Est. average glucose Bld gHb Est-mCnc: 157 mg/dL
Hgb A1c MFr Bld: 7.1 % — ABNORMAL HIGH (ref 4.8–5.6)

## 2019-11-12 ENCOUNTER — Telehealth: Payer: Self-pay

## 2019-11-12 MED ORDER — ATORVASTATIN CALCIUM 40 MG PO TABS: ORAL_TABLET | ORAL | 1 refills | Status: DC

## 2019-11-12 NOTE — Telephone Encounter (Signed)
The patient was given her most recent lab results.

## 2019-11-13 ENCOUNTER — Telehealth: Payer: Self-pay | Admitting: Radiation Oncology

## 2019-11-13 NOTE — Telephone Encounter (Signed)
  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  Narrative:  The patient was contacted today for routine follow-up. During treatment she did very well with radiotherapy and did not have significant desquamation. She reports she has some itchy dry skin over the breast. She denies any pain however.  Impression/Plan: 1. ER/PR positive intermediate grade ER/PR positive DCIS of the left breast. The patient has been doing well since completion of radiotherapy. We discussed that we would be happy to continue to follow her as needed, but she will also continue to follow up with [name] in medical oncology. She was counseled on skin care as well as measures to avoid sun exposure to this area.  2. Survivorship. We discussed the importance of survivorship evaluation and she is scheduled for this evaluation in February 2021.    Carola Rhine, PAC

## 2019-11-19 ENCOUNTER — Encounter: Payer: Self-pay | Admitting: Internal Medicine

## 2019-11-19 LAB — HM DIABETES EYE EXAM

## 2019-12-06 ENCOUNTER — Other Ambulatory Visit: Payer: Self-pay | Admitting: Internal Medicine

## 2019-12-06 DIAGNOSIS — N179 Acute kidney failure, unspecified: Secondary | ICD-10-CM

## 2019-12-10 ENCOUNTER — Other Ambulatory Visit: Payer: Self-pay | Admitting: Internal Medicine

## 2019-12-10 DIAGNOSIS — D0512 Intraductal carcinoma in situ of left breast: Secondary | ICD-10-CM | POA: Diagnosis not present

## 2019-12-10 DIAGNOSIS — R911 Solitary pulmonary nodule: Secondary | ICD-10-CM

## 2019-12-10 DIAGNOSIS — N179 Acute kidney failure, unspecified: Secondary | ICD-10-CM | POA: Diagnosis not present

## 2019-12-10 DIAGNOSIS — E1122 Type 2 diabetes mellitus with diabetic chronic kidney disease: Secondary | ICD-10-CM | POA: Diagnosis not present

## 2019-12-10 DIAGNOSIS — N200 Calculus of kidney: Secondary | ICD-10-CM | POA: Diagnosis not present

## 2019-12-10 DIAGNOSIS — I129 Hypertensive chronic kidney disease with stage 1 through stage 4 chronic kidney disease, or unspecified chronic kidney disease: Secondary | ICD-10-CM | POA: Diagnosis not present

## 2019-12-12 ENCOUNTER — Telehealth: Payer: Self-pay

## 2019-12-16 ENCOUNTER — Ambulatory Visit (INDEPENDENT_AMBULATORY_CARE_PROVIDER_SITE_OTHER): Payer: Medicare PPO | Admitting: Internal Medicine

## 2019-12-16 ENCOUNTER — Encounter: Payer: Self-pay | Admitting: Internal Medicine

## 2019-12-16 ENCOUNTER — Other Ambulatory Visit: Payer: Self-pay

## 2019-12-16 VITALS — BP 128/84 | HR 76 | Temp 98.1°F | Ht 65.0 in | Wt 167.8 lb

## 2019-12-16 DIAGNOSIS — C50412 Malignant neoplasm of upper-outer quadrant of left female breast: Secondary | ICD-10-CM | POA: Diagnosis not present

## 2019-12-16 DIAGNOSIS — L299 Pruritus, unspecified: Secondary | ICD-10-CM

## 2019-12-16 DIAGNOSIS — Z170 Estrogen receptor positive status [ER+]: Secondary | ICD-10-CM

## 2019-12-16 DIAGNOSIS — E7800 Pure hypercholesterolemia, unspecified: Secondary | ICD-10-CM | POA: Diagnosis not present

## 2019-12-16 DIAGNOSIS — Z79899 Other long term (current) drug therapy: Secondary | ICD-10-CM

## 2019-12-16 MED ORDER — HYDROXYZINE HCL 10 MG PO TABS: 10.0000 mg | ORAL_TABLET | Freq: Three times a day (TID) | ORAL | 0 refills | Status: DC | PRN

## 2019-12-16 NOTE — Progress Notes (Signed)
This visit occurred during the SARS-CoV-2 public health emergency.  Safety protocols were in place, including screening questions prior to the visit, additional usage of staff PPE, and extensive cleaning of exam room while observing appropriate contact time as indicated for disinfecting solutions.  Subjective:     Patient ID: Michele Wang , female    DOB: [birth year] , 69 y.o.   MRN: [id number]   Chief Complaint  Patient presents with  . Hyperlipidemia    HPI  She is here today for a cholesterol check. She reports compliance with atorvastatin. She has noticed increased itching in her upper chest. But she thinks this is due to prior breast surgery.   Hyperlipidemia This is a chronic problem. The problem is uncontrolled. Exacerbating diseases include diabetes. Current antihyperlipidemic treatment includes statins. The current treatment provides moderate improvement of lipids. Compliance problems include adherence to exercise.      Past Medical History:  Diagnosis Date  . Diabetes mellitus without complication ([place])    Type II  . Hyperlipidemia   . Hypertension   . UTI (urinary tract infection)      Family History  Problem Relation Age of Onset  . Aneurysm Mother   . Healthy Father      Current Outpatient Medications:  .  atorvastatin (LIPITOR) 40 MG tablet, Take 1 tablet by mouth Monday - Friday, Disp: 30 tablet, Rfl: 1 .  CALCIUM PO, Take 1,000 mg by mouth daily., Disp: , Rfl:  .  Olmesartan-amLODIPine-HCTZ (TRIBENZOR) 40-5-25 MG TABS, Take 1 tablet by mouth daily., Disp: 90 tablet, Rfl: 2 .  sitaGLIPtin (JANUVIA) 100 MG tablet, Take 1 tablet (100 mg total) by mouth daily., Disp: 90 tablet, Rfl: 1 .  tamoxifen (NOLVADEX) 20 MG tablet, Take 1 tablet (20 mg total) by mouth daily., Disp: 90 tablet, Rfl: 3 .  Ascorbic Acid (VITAMIN C) 1000 MG tablet, Take 1,000 mg by mouth daily., Disp: , Rfl:  .  hydrOXYzine (ATARAX/VISTARIL) 10 MG tablet, Take 1 tablet (10 mg total) by mouth 3  (three) times daily as needed., Disp: 30 tablet, Rfl: 0 .  Vitamin D, Cholecalciferol, 25 MCG (1000 UT) CAPS, Take by mouth., Disp: , Rfl:    No Known Allergies   Review of Systems  Constitutional: Negative.   Respiratory: Negative.   Cardiovascular: Negative.   Gastrointestinal: Negative.   Skin:       She c/o itching, usually on left chest/left arm. She has not noticed a rash. Started about 3 weeks ago. She is not sure what may have triggered her symptoms. She has tried Benadryl with mild relief of her sx.   Neurological: Negative.   Psychiatric/Behavioral: Negative.      Today's Vitals   12/16/19 0924  BP: 128/84  Pulse: 76  Temp: 98.1 F (36.7 C)  TempSrc: Oral  Weight: 167 lb 12.8 oz (76.1 kg)  Height: 5\' 5"  (1.651 m)   Body mass index is 27.92 kg/m.   Objective:  Physical Exam Vitals and nursing note reviewed.  Constitutional:      Appearance: Normal appearance.  HENT:     Head: Normocephalic and atraumatic.  Cardiovascular:     Rate and Rhythm: Normal rate and regular rhythm.     Heart sounds: Normal heart sounds.  Pulmonary:     Effort: Pulmonary effort is normal.     Breath sounds: Normal breath sounds.  Chest:     Breasts:        Right: Normal.  Left: Normal.     Comments: Healed surgical scar on left breast. No rash noted.  Skin:    General: Skin is warm.  Neurological:     General: No focal deficit present.     Mental Status: She is alert.  Psychiatric:        Mood and Affect: Mood normal.        Behavior: Behavior normal.         Assessment And Plan:     1. Pure hypercholesterolemia  I will check fasting lipid panel and LFTs today. She is encouraged to avoid fried foods and to incorporate more exercise into her daily routine.   - Lipid panel  2. Pruritus  I will check liver function today. She was also given rx hydroxyzine 10mg  po tid prn itching. She was advised that this medication may make her drowsy. I will make further  recommendations once her labs are available for review.   - Liver Profile  3. Malignant neoplasm of upper-outer quadrant of left breast in female, estrogen receptor positive ([place])  She is s/p lumpectomy and radiation. She is now on tamoxifen.   4. Drug therapy   [name], MD    THE PATIENT IS ENCOURAGED TO PRACTICE SOCIAL DISTANCING DUE TO THE COVID-19 PANDEMIC.

## 2019-12-16 NOTE — Patient Instructions (Signed)
Pruritus Pruritus is an itchy feeling on the skin. One of the most common causes is dry skin, but many different things can cause itching. Most cases of itching do not require medical attention. Sometimes itchy skin can turn into a rash. Follow these instructions at home: Skin care   Apply moisturizing lotion to your skin as needed. Lotion that contains petroleum jelly is best.  Take medicines or apply medicated creams only as told by your health care provider. This may include: ? Corticosteroid cream. ? Anti-itch lotions. ? Oral antihistamines.  Apply a cool, wet cloth (cool compress) to the affected areas.  Take baths with one of the following: ? Epsom salts. You can get these at your local pharmacy or grocery store. Follow the instructions on the packaging. ? Baking soda. Pour a small amount into the bath as told by your health care provider. ? Colloidal oatmeal. You can get this at your local pharmacy or grocery store. Follow the instructions on the packaging.  Apply baking soda paste to your skin. To make the paste, stir water into a small amount of baking soda until it reaches a paste-like consistency.  Do not scratch your skin.  Do not take hot showers or baths, which can make itching worse. A cool shower may help with itching as long as you apply moisturizing lotion after the shower.  Do not use scented soaps, detergents, perfumes, and cosmetic products. Instead, use gentle, unscented versions of these items. General instructions  Avoid wearing tight clothes.  Keep a journal to help find out what is causing your itching. Write down: ? What you eat and drink. ? What cosmetic products you use. ? What soaps or detergents you use. ? What you wear, including jewelry.  Use a humidifier. This keeps the air moist, which helps to prevent dry skin.  Be aware of any changes in your itchiness. Contact a health care provider if:  The itching does not go away after several  days.  You are unusually thirsty or urinating more than normal.  Your skin tingles or feels numb.  Your skin or the white parts of your eyes turn yellow (jaundice).  You feel weak.  You have any of the following: ? Night sweats. ? Tiredness (fatigue). ? Weight loss. ? Abdominal pain. Summary  Pruritus is an itchy feeling on the skin. One of the most common causes is dry skin, but many different conditions and factors can cause itching.  Apply moisturizing lotion to your skin as needed. Lotion that contains petroleum jelly is best.  Take medicines or apply medicated creams only as told by your health care provider.  Do not take hot showers or baths. Do not use scented soaps, detergents, perfumes, or cosmetic products. This information is not intended to replace advice given to you by your health care provider. Make sure you discuss any questions you have with your health care provider. Document Revised: 11/28/2017 Document Reviewed: 11/28/2017 Elsevier Patient Education  2020 [employer] Inc[employer]  

## 2019-12-17 ENCOUNTER — Ambulatory Visit: Payer: Self-pay | Admitting: Internal Medicine

## 2019-12-17 LAB — HEPATIC FUNCTION PANEL
ALT: 23 IU/L (ref 0–32)
AST: 23 IU/L (ref 0–40)
Albumin: 4.1 g/dL (ref 3.8–4.8)
Alkaline Phosphatase: 95 IU/L (ref 39–117)
Bilirubin Total: 0.3 mg/dL (ref 0.0–1.2)
Bilirubin, Direct: 0.08 mg/dL (ref 0.00–0.40)
Total Protein: 7.1 g/dL (ref 6.0–8.5)

## 2019-12-17 LAB — LIPID PANEL
Chol/HDL Ratio: 2.3 ratio (ref 0.0–4.4)
Cholesterol, Total: 126 mg/dL (ref 100–199)
HDL: 54 mg/dL (ref >39)
LDL Chol Calc (NIH): 49 mg/dL (ref 0–99)
Triglycerides: 136 mg/dL (ref 0–149)
VLDL Cholesterol Cal: 23 mg/dL (ref 5–40)

## 2019-12-19 ENCOUNTER — Other Ambulatory Visit: Payer: Self-pay | Admitting: Internal Medicine

## 2019-12-20 ENCOUNTER — Other Ambulatory Visit: Payer: Medicare Other

## 2019-12-25 ENCOUNTER — Telehealth: Payer: Self-pay

## 2019-12-25 ENCOUNTER — Telehealth: Payer: Self-pay | Admitting: Adult Health

## 2019-12-25 NOTE — Telephone Encounter (Signed)
Rescheduled per provider. Called and left a msg. Mailing printout  

## 2019-12-25 NOTE — Telephone Encounter (Signed)
The pt was told that Dr. Baird Cancer said to stop the cholesterol med and to restart it on next week and take on Monday and Thursdays only.  The pt was scheduled an appt for tomorrow.

## 2019-12-26 ENCOUNTER — Encounter: Payer: Self-pay | Admitting: Nurse Practitioner

## 2019-12-26 ENCOUNTER — Ambulatory Visit (INDEPENDENT_AMBULATORY_CARE_PROVIDER_SITE_OTHER): Payer: Medicare PPO | Admitting: Nurse Practitioner

## 2019-12-26 ENCOUNTER — Other Ambulatory Visit: Payer: Self-pay

## 2019-12-26 VITALS — BP 110/70 | HR 83 | Temp 98.8°F | Ht 65.4 in | Wt 164.6 lb

## 2019-12-26 DIAGNOSIS — Z20822 Contact with and (suspected) exposure to covid-19: Secondary | ICD-10-CM | POA: Diagnosis not present

## 2019-12-26 DIAGNOSIS — M79602 Pain in left arm: Secondary | ICD-10-CM | POA: Diagnosis not present

## 2019-12-26 DIAGNOSIS — M791 Myalgia, unspecified site: Secondary | ICD-10-CM | POA: Diagnosis not present

## 2019-12-26 DIAGNOSIS — M549 Dorsalgia, unspecified: Secondary | ICD-10-CM | POA: Diagnosis not present

## 2019-12-26 DIAGNOSIS — R6883 Chills (without fever): Secondary | ICD-10-CM

## 2019-12-26 NOTE — Progress Notes (Signed)
This visit occurred during the SARS-CoV-2 public health emergency.  Safety protocols were in place, including screening questions prior to the visit, additional usage of staff PPE, and extensive cleaning of exam room while observing appropriate contact time as indicated for disinfecting solutions.  Subjective:     Patient ID: Michele Wang , female    DOB: 29-Mar-1951 , 69 y.o.   MRN: [id number]   Chief Complaint  Patient presents with  . Back Pain  . Arm Pain    patient stated her arm has been sore since tuesday and she was recently taken off of her cholesterol medicine    HPI  Few months ago she had surgery to her left breast for ductal carcinoma.  She had been having an itch   Back Pain This is a new problem. The quality of the pain is described as aching. Pertinent negatives include no abdominal pain, chest pain or headaches. (Chills started on Tuesday afternoon with some aching.  That has subsided. Fatigue.  )  Arm Pain  The pain is present in the upper left arm. The quality of the pain is described as aching. The pain does not radiate. Pertinent negatives include no chest pain or muscle weakness. She has tried acetaminophen for the symptoms.     Past Medical History:  Diagnosis Date  . Diabetes mellitus without complication ([place])    Type II  . Hyperlipidemia   . Hypertension   . UTI (urinary tract infection)      Family History  Problem Relation Age of Onset  . Aneurysm Mother   . Healthy Father      Current Outpatient Medications:  .  hydrOXYzine (ATARAX/VISTARIL) 10 MG tablet, Take 1 tablet (10 mg total) by mouth 3 (three) times daily as needed., Disp: 30 tablet, Rfl: 0 .  Olmesartan-amLODIPine-HCTZ (TRIBENZOR) 40-5-25 MG TABS, Take 1 tablet by mouth daily., Disp: 90 tablet, Rfl: 2 .  sitaGLIPtin (JANUVIA) 100 MG tablet, Take 1 tablet (100 mg total) by mouth daily., Disp: 90 tablet, Rfl: 1 .  tamoxifen (NOLVADEX) 20 MG tablet, Take 1 tablet (20 mg total) by mouth  daily., Disp: 90 tablet, Rfl: 3 .  Vitamin D, Cholecalciferol, 25 MCG (1000 UT) CAPS, Take by mouth., Disp: , Rfl:  .  Ascorbic Acid (VITAMIN C) 1000 MG tablet, Take 1,000 mg by mouth daily., Disp: , Rfl:  .  atorvastatin (LIPITOR) 40 MG tablet, Take 1 tablet by mouth Monday - Friday (Patient not taking: Reported on 12/26/2019), Disp: 30 tablet, Rfl: 1 .  CALCIUM PO, Take 1,000 mg by mouth daily., Disp: , Rfl:    No Known Allergies   Review of Systems  Constitutional: Negative.   Respiratory: Negative.   Cardiovascular: Negative for chest pain, palpitations and leg swelling.  Gastrointestinal: Negative for abdominal pain.  Musculoskeletal: Positive for arthralgias (left arm), back pain and myalgias.  Neurological: Negative for dizziness and headaches.     Today's Vitals   12/26/19 1052  BP: 110/70  Pulse: 83  Temp: 98.8 F (37.1 C)  TempSrc: Oral  Weight: 164 lb 9.6 oz (74.7 kg)  Height: 5' 5.4" (1.661 m)  PainSc: 0-No pain   Body mass index is 27.06 kg/m.   Objective:  Physical Exam Constitutional:      Appearance: Normal appearance.  Cardiovascular:     Rate and Rhythm: Normal rate and regular rhythm.     Pulses: Normal pulses.     Heart sounds: Normal heart sounds. No murmur.  Pulmonary:  Effort: Pulmonary effort is normal. No respiratory distress.     Breath sounds: Normal breath sounds.  Neurological:     General: No focal deficit present.     Mental Status: She is alert and oriented to person, place, and time.  Psychiatric:        Mood and Affect: Mood normal.        Behavior: Behavior normal.        Thought Content: Thought content normal.        Judgment: Judgment normal.         Assessment And Plan:     1. Other acute back pain  Slight tenderness to right mid back  2. Left arm pain  Improving, she has been off of her cholesterol medication for 2-3 days  3. Muscle pain  May be myalgia related to statins vs covid mainly due to her having  chills as well a few days ago  Will check for coronvirus, encouraged to be in self isolation until receive results - Novel Coronavirus, NAA ([facility])  4. Chills  She had 2 days ago, has improved.    Minette Brine, FNP    THE PATIENT IS ENCOURAGED TO PRACTICE SOCIAL DISTANCING DUE TO THE COVID-19 PANDEMIC.

## 2019-12-27 ENCOUNTER — Telehealth: Payer: Self-pay | Admitting: Adult Health

## 2019-12-27 LAB — NOVEL CORONAVIRUS, NAA: SARS-CoV-2, NAA: NOT DETECTED

## 2019-12-27 NOTE — Telephone Encounter (Signed)
Rescheduled per 1/28 sch msg, (pt req). Called and left msg. Mailing printout

## 2019-12-31 ENCOUNTER — Inpatient Hospital Stay: Admission: RE | Admit: 2019-12-31 | Source: Ambulatory Visit

## 2020-01-08 ENCOUNTER — Encounter: Payer: Medicare Other | Admitting: Adult Health

## 2020-01-10 ENCOUNTER — Observation Stay (HOSPITAL_COMMUNITY)
Admission: EM | Admit: 2020-01-10 | Discharge: 2020-01-11 | Disposition: A | Discharge status: Home / Self Care | Payer: Medicare PPO | Attending: Internal Medicine | Admitting: Internal Medicine

## 2020-01-10 ENCOUNTER — Encounter (HOSPITAL_COMMUNITY): Payer: Self-pay | Admitting: Emergency Medicine

## 2020-01-10 ENCOUNTER — Other Ambulatory Visit: Payer: Self-pay

## 2020-01-10 DIAGNOSIS — E860 Dehydration: Secondary | ICD-10-CM | POA: Insufficient documentation

## 2020-01-10 DIAGNOSIS — E1310 Other specified diabetes mellitus with ketoacidosis without coma: Secondary | ICD-10-CM

## 2020-01-10 DIAGNOSIS — E1100 Type 2 diabetes mellitus with hyperosmolarity without nonketotic hyperglycemic-hyperosmolar coma (NKHHC): Secondary | ICD-10-CM | POA: Diagnosis not present

## 2020-01-10 DIAGNOSIS — Z8249 Family history of ischemic heart disease and other diseases of the circulatory system: Secondary | ICD-10-CM | POA: Diagnosis not present

## 2020-01-10 DIAGNOSIS — Z20822 Contact with and (suspected) exposure to covid-19: Secondary | ICD-10-CM | POA: Insufficient documentation

## 2020-01-10 DIAGNOSIS — N1832 Chronic kidney disease, stage 3b: Secondary | ICD-10-CM | POA: Diagnosis not present

## 2020-01-10 DIAGNOSIS — E1122 Type 2 diabetes mellitus with diabetic chronic kidney disease: Principal | ICD-10-CM | POA: Insufficient documentation

## 2020-01-10 DIAGNOSIS — N183 Chronic kidney disease, stage 3 unspecified: Secondary | ICD-10-CM

## 2020-01-10 DIAGNOSIS — Z79810 Long term (current) use of selective estrogen receptor modulators (SERMs): Secondary | ICD-10-CM | POA: Insufficient documentation

## 2020-01-10 DIAGNOSIS — N390 Urinary tract infection, site not specified: Secondary | ICD-10-CM

## 2020-01-10 DIAGNOSIS — R739 Hyperglycemia, unspecified: Secondary | ICD-10-CM | POA: Diagnosis present

## 2020-01-10 DIAGNOSIS — B379 Candidiasis, unspecified: Secondary | ICD-10-CM | POA: Diagnosis not present

## 2020-01-10 DIAGNOSIS — N179 Acute kidney failure, unspecified: Secondary | ICD-10-CM | POA: Diagnosis not present

## 2020-01-10 DIAGNOSIS — B370 Candidal stomatitis: Secondary | ICD-10-CM | POA: Diagnosis not present

## 2020-01-10 DIAGNOSIS — Z794 Long term (current) use of insulin: Secondary | ICD-10-CM | POA: Diagnosis present

## 2020-01-10 DIAGNOSIS — I10 Essential (primary) hypertension: Secondary | ICD-10-CM | POA: Diagnosis present

## 2020-01-10 DIAGNOSIS — E785 Hyperlipidemia, unspecified: Secondary | ICD-10-CM | POA: Insufficient documentation

## 2020-01-10 DIAGNOSIS — Z170 Estrogen receptor positive status [ER+]: Secondary | ICD-10-CM | POA: Diagnosis not present

## 2020-01-10 DIAGNOSIS — J029 Acute pharyngitis, unspecified: Secondary | ICD-10-CM | POA: Diagnosis not present

## 2020-01-10 DIAGNOSIS — E1165 Type 2 diabetes mellitus with hyperglycemia: Secondary | ICD-10-CM | POA: Diagnosis not present

## 2020-01-10 DIAGNOSIS — E7800 Pure hypercholesterolemia, unspecified: Secondary | ICD-10-CM | POA: Diagnosis present

## 2020-01-10 DIAGNOSIS — Z79899 Other long term (current) drug therapy: Secondary | ICD-10-CM | POA: Diagnosis not present

## 2020-01-10 DIAGNOSIS — I129 Hypertensive chronic kidney disease with stage 1 through stage 4 chronic kidney disease, or unspecified chronic kidney disease: Secondary | ICD-10-CM | POA: Diagnosis not present

## 2020-01-10 DIAGNOSIS — Z131 Encounter for screening for diabetes mellitus: Secondary | ICD-10-CM | POA: Diagnosis not present

## 2020-01-10 DIAGNOSIS — E1110 Type 2 diabetes mellitus with ketoacidosis without coma: Secondary | ICD-10-CM | POA: Insufficient documentation

## 2020-01-10 DIAGNOSIS — C50412 Malignant neoplasm of upper-outer quadrant of left female breast: Secondary | ICD-10-CM | POA: Insufficient documentation

## 2020-01-10 HISTORY — DX: Malignant neoplasm of unspecified site of unspecified female breast: C50.919

## 2020-01-10 HISTORY — DX: Chronic kidney disease, unspecified: N18.9

## 2020-01-10 LAB — URINALYSIS, ROUTINE W REFLEX MICROSCOPIC
Bilirubin Urine: NEGATIVE
Glucose, UA: >=500 mg/dL — AB
Hgb urine dipstick: NEGATIVE
Ketones, ur: 5 mg/dL — AB
Nitrite: NEGATIVE
Protein, ur: NEGATIVE mg/dL
Specific Gravity, Urine: 1.018 (ref 1.005–1.030)
pH: 5 (ref 5.0–8.0)

## 2020-01-10 LAB — CBC
HCT: 38.7 % (ref 36.0–46.0)
Hemoglobin: 12.6 g/dL (ref 12.0–15.0)
MCH: 28.9 pg (ref 26.0–34.0)
MCHC: 32.6 g/dL (ref 30.0–36.0)
MCV: 88.8 fL (ref 80.0–100.0)
Platelets: 342 10*3/uL (ref 150–400)
RBC: 4.36 MIL/uL (ref 3.87–5.11)
RDW: 13 % (ref 11.5–15.5)
WBC: 6.9 10*3/uL (ref 4.0–10.5)
nRBC: 0 % (ref 0.0–0.2)

## 2020-01-10 LAB — BASIC METABOLIC PANEL
Anion gap: 18 — ABNORMAL HIGH (ref 5–15)
BUN: 50 mg/dL — ABNORMAL HIGH (ref 8–23)
CO2: 24 mmol/L (ref 22–32)
Calcium: 10 mg/dL (ref 8.9–10.3)
Chloride: 90 mmol/L — ABNORMAL LOW (ref 98–111)
Creatinine, Ser: 2.66 mg/dL — ABNORMAL HIGH (ref 0.44–1.00)
GFR calc Af Amer: 21 mL/min — ABNORMAL LOW (ref >60)
GFR calc non Af Amer: 18 mL/min — ABNORMAL LOW (ref >60)
Glucose, Bld: 662 mg/dL (ref 70–99)
Potassium: 4.5 mmol/L (ref 3.5–5.1)
Sodium: 132 mmol/L — ABNORMAL LOW (ref 135–145)

## 2020-01-10 LAB — RESPIRATORY PANEL BY RT PCR (FLU A&B, COVID)
Influenza A by PCR: NEGATIVE
Influenza B by PCR: NEGATIVE
SARS Coronavirus 2 by RT PCR: NEGATIVE

## 2020-01-10 LAB — CBG MONITORING, ED
Glucose-Capillary: >600 mg/dL (ref 70–99)
Glucose-Capillary: >600 mg/dL (ref 70–99)
Glucose-Capillary: 468 mg/dL — ABNORMAL HIGH (ref 70–99)
Glucose-Capillary: 486 mg/dL — ABNORMAL HIGH (ref 70–99)

## 2020-01-10 LAB — GLUCOSE, CAPILLARY
Glucose-Capillary: 338 mg/dL — ABNORMAL HIGH (ref 70–99)
Glucose-Capillary: 188 mg/dL — ABNORMAL HIGH (ref 70–99)

## 2020-01-10 MED ORDER — SODIUM CHLORIDE 0.9 % IV SOLN: INTRAVENOUS | Status: DC

## 2020-01-10 MED ORDER — DEXTROSE 50 % IV SOLN: 0.0000 mL | INTRAVENOUS | Status: DC | PRN

## 2020-01-10 MED ORDER — INSULIN ASPART 100 UNIT/ML ~~LOC~~ SOLN
0.0000 [IU] | SUBCUTANEOUS | Status: DC
  Administered 2020-01-10: 2 [IU] via SUBCUTANEOUS
  Administered 2020-01-11: 3 [IU] via SUBCUTANEOUS
  Administered 2020-01-11: 2 [IU] via SUBCUTANEOUS

## 2020-01-10 MED ORDER — SODIUM CHLORIDE 0.9 % IV SOLN
1.0000 g | Freq: Once | INTRAVENOUS | Status: AC
  Administered 2020-01-10: 1 g via INTRAVENOUS
  Filled 2020-01-10: qty 10

## 2020-01-10 MED ORDER — INSULIN ASPART 100 UNIT/ML ~~LOC~~ SOLN
8.0000 [IU] | Freq: Once | SUBCUTANEOUS | Status: AC
  Administered 2020-01-10: 8 [IU] via INTRAVENOUS

## 2020-01-10 MED ORDER — HYDROCHLOROTHIAZIDE 25 MG PO TABS: 25.0000 mg | ORAL_TABLET | Freq: Every day | ORAL | Status: DC

## 2020-01-10 MED ORDER — IRBESARTAN 300 MG PO TABS: 300.0000 mg | ORAL_TABLET | Freq: Every day | ORAL | Status: DC

## 2020-01-10 MED ORDER — SODIUM CHLORIDE 0.9 % IV BOLUS
1000.0000 mL | Freq: Once | INTRAVENOUS | Status: AC
  Administered 2020-01-10: 1000 mL via INTRAVENOUS

## 2020-01-10 MED ORDER — LINAGLIPTIN 5 MG PO TABS
5.0000 mg | ORAL_TABLET | Freq: Every day | ORAL | Status: DC
  Administered 2020-01-11: 5 mg via ORAL
  Filled 2020-01-10: qty 1

## 2020-01-10 MED ORDER — ENOXAPARIN SODIUM 30 MG/0.3ML ~~LOC~~ SOLN
30.0000 mg | SUBCUTANEOUS | Status: DC
  Administered 2020-01-10: 30 mg via SUBCUTANEOUS
  Filled 2020-01-10: qty 0.3

## 2020-01-10 MED ORDER — AMLODIPINE BESYLATE 5 MG PO TABS
5.0000 mg | ORAL_TABLET | Freq: Every day | ORAL | Status: DC
  Administered 2020-01-11: 5 mg via ORAL
  Filled 2020-01-10: qty 1

## 2020-01-10 MED ORDER — SODIUM CHLORIDE 0.9 % IV SOLN
1.0000 g | INTRAVENOUS | Status: DC
  Filled 2020-01-10: qty 10

## 2020-01-10 MED ORDER — TAMOXIFEN CITRATE 10 MG PO TABS
20.0000 mg | ORAL_TABLET | Freq: Every day | ORAL | Status: DC
  Administered 2020-01-11: 20 mg via ORAL
  Filled 2020-01-10: qty 2

## 2020-01-10 MED ORDER — NYSTATIN 100000 UNIT/ML MT SUSP
5.0000 mL | Freq: Four times a day (QID) | OROMUCOSAL | Status: DC
  Administered 2020-01-10 – 2020-01-11 (×4): 500000 [IU] via ORAL
  Filled 2020-01-10 (×4): qty 5

## 2020-01-10 MED ORDER — OLMESARTAN-AMLODIPINE-HCTZ 40-5-25 MG PO TABS: 1.0000 | ORAL_TABLET | Freq: Every day | ORAL | Status: DC

## 2020-01-10 MED ORDER — INSULIN REGULAR(HUMAN) IN NACL 100-0.9 UT/100ML-% IV SOLN
INTRAVENOUS | Status: DC
  Administered 2020-01-10: 9 [IU]/h via INTRAVENOUS
  Filled 2020-01-10: qty 100

## 2020-01-10 MED ORDER — DEXTROSE-NACL 5-0.45 % IV SOLN: INTRAVENOUS | Status: DC

## 2020-01-10 NOTE — ED Provider Notes (Signed)
[facility] EMERGENCY DEPARTMENT Provider Note   CSN: [id number] Arrival date & time: 01/10/20  1759     History Chief Complaint  Patient presents with  . Hyperglycemia    Michele Wang is a 69 y.o. female with history of diabetes, hypertension, hyperlipidemia, breast cancer currently in remission who presents with hyperglycemia.  Patient states that she has been feeling bad for the past 3 to 4 days.  She states that she thought she had a "little bug".  She reports runny nose, minor cough, urinary frequency.  She states that she is also developed a dry mouth and thrush on her tongue.  She went to urgent care today and they told her that her blood sugar was significantly elevated and advised her to come to the emergency department.  She denies fever, chills, headache, sore throat, chest pain, shortness of breath, abdominal pain, nausea, vomiting, diarrhea.  She has not had any dysuria or hematuria.  She takes Januvia daily for diabetes although did not take her medication today.  She has been drinking a lot of orange juice because she is thirsty. Her last A1c in December was 7.1.  She got tested for Covid on January 28 and it was negative.  HPI     Past Medical History:  Diagnosis Date  . Diabetes mellitus without complication ([place])    Type II  . Hyperlipidemia   . Hypertension   . UTI (urinary tract infection)     Patient Active Problem List   Diagnosis Date Noted  . Malignant neoplasm of upper-outer quadrant of left breast in female, estrogen receptor positive ([place]) 12/16/2019  . Ductal carcinoma in situ (DCIS) of left breast 07/17/2019  . Type 2 diabetes mellitus with stage 3 chronic kidney disease, without long-term current use of insulin ([place]) 11/08/2018  . Chronic renal disease, stage III 11/08/2018  . Hypertensive nephropathy 11/08/2018  . Pure hypercholesterolemia 11/08/2018  . Spider veins of both lower extremities 08/15/2017    Past Surgical History:    Procedure Laterality Date  . BREAST LUMPECTOMY WITH RADIOACTIVE SEED LOCALIZATION Left 08/21/2019   Procedure: LEFT BREAST LUMPECTOMY WITH RADIOACTIVE SEED LOCALIZATION;  Surgeon: Jovita Kussmaul, MD;  Location: [facility];  Service: General;  Laterality: Left;  . CESAREAN SECTION     x2     OB History   No obstetric history on file.     Family History  Problem Relation Age of Onset  . Aneurysm Mother   . Healthy Father     Social History   Tobacco Use  . Smoking status: Never Smoker  . Smokeless tobacco: Never Used  Substance Use Topics  . Alcohol use: No  . Drug use: No    Home Medications Prior to Admission medications   Medication Sig Start Date End Date Taking? Authorizing Provider  Ascorbic Acid (VITAMIN C) 1000 MG tablet Take 1,000 mg by mouth daily.    [provider]  atorvastatin (LIPITOR) 40 MG tablet Take 1 tablet by mouth Monday - Friday Patient not taking: Reported on 12/26/2019 11/12/19   [name], MD  CALCIUM PO Take 1,000 mg by mouth daily.    [provider]  hydrOXYzine (ATARAX/VISTARIL) 10 MG tablet Take 1 tablet (10 mg total) by mouth 3 (three) times daily as needed. 12/16/19   [name], MD  Olmesartan-amLODIPine-HCTZ [facility]) 40-5-25 MG TABS Take 1 tablet by mouth daily. 08/26/19   [name], MD  sitaGLIPtin (JANUVIA) 100 MG tablet Take  1 tablet (100 mg total) by mouth daily. 09/03/19   [name], MD  tamoxifen (NOLVADEX) 20 MG tablet Take 1 tablet (20 mg total) by mouth daily. 10/07/19   Nicholas Lose, MD  Vitamin D, Cholecalciferol, 25 MCG (1000 UT) CAPS Take by mouth.    [provider]    Allergies    Patient has no known allergies.  Review of Systems   Review of Systems  Constitutional: Negative for chills and fever.  HENT: Positive for rhinorrhea.   Respiratory: Positive for cough. Negative for shortness of breath.   Cardiovascular: Negative for chest pain.  Gastrointestinal:  Negative for abdominal pain, nausea and vomiting.  Endocrine: Positive for polydipsia and polyuria.  Genitourinary: Positive for frequency. Negative for dysuria and hematuria.  Allergic/Immunologic: Positive for immunocompromised state.  All other systems reviewed and are negative.   Physical Exam Updated Vital Signs BP 121/88   Pulse 95   Temp 98.4 F (36.9 C) (Oral)   Resp 18   Ht 5\' 5"  (1.651 m)   Wt 72.6 kg   SpO2 98%   BMI 26.63 kg/m   Physical Exam Vitals and nursing note reviewed.  Constitutional:      General: She is not in acute distress.    Appearance: Normal appearance. She is well-developed. She is not ill-appearing.     Comments: Calm, cooperative. NAD  HENT:     Head: Normocephalic and atraumatic.     Mouth/Throat:     Comments: Thrush on tongue Eyes:     General: No scleral icterus.       Right eye: No discharge.        Left eye: No discharge.     Conjunctiva/sclera: Conjunctivae normal.     Pupils: Pupils are equal, round, and reactive to light.  Cardiovascular:     Rate and Rhythm: Normal rate and regular rhythm.  Pulmonary:     Effort: Pulmonary effort is normal. No respiratory distress.     Breath sounds: Normal breath sounds.  Abdominal:     General: There is no distension.     Palpations: Abdomen is soft.     Tenderness: There is no abdominal tenderness.  Musculoskeletal:     Cervical back: Normal range of motion.     Right lower leg: No edema.     Left lower leg: No edema.  Skin:    General: Skin is warm and dry.  Neurological:     Mental Status: She is alert and oriented to person, place, and time.  Psychiatric:        Behavior: Behavior normal.     ED Results / Procedures / Treatments   Labs (all labs ordered are listed, but only abnormal results are displayed) Labs Reviewed  BASIC METABOLIC PANEL - Abnormal; Notable for the following components:      Result Value   Sodium 132 (*)    Chloride 90 (*)    Glucose, Bld 662 (*)     BUN 50 (*)    Creatinine, Ser 2.66 (*)    GFR calc non Af Amer 18 (*)    GFR calc Af Amer 21 (*)    Anion gap 18 (*)    All other components within normal limits  URINALYSIS, ROUTINE W REFLEX MICROSCOPIC - Abnormal; Notable for the following components:   Color, Urine STRAW (*)    APPearance HAZY (*)    Glucose, UA >=500 (*)    Ketones, ur 5 (*)    Leukocytes,Ua SMALL (*)  Bacteria, UA MANY (*)    All other components within normal limits  CBG MONITORING, ED - Abnormal; Notable for the following components:   Glucose-Capillary >600 (*)    All other components within normal limits  CBG MONITORING, ED - Abnormal; Notable for the following components:   Glucose-Capillary >600 (*)    All other components within normal limits  URINE CULTURE  RESPIRATORY PANEL BY RT PCR (FLU A&B, COVID)  CBC  HIV ANTIBODY (ROUTINE TESTING W REFLEX)  BASIC METABOLIC PANEL  HEMOGLOBIN A1C    EKG None  Radiology No results found.  Procedures Procedures (including critical care time)  CRITICAL CARE Performed by: Recardo Evangelist   Total critical care time: 35 minutes  Critical care time was exclusive of separately billable procedures and treating other patients.  Critical care was necessary to treat or prevent imminent or life-threatening deterioration.  Critical care was time spent personally by me on the following activities: development of treatment plan with patient and/or surrogate as well as nursing, discussions with consultants, evaluation of patient's response to treatment, examination of patient, obtaining history from patient or surrogate, ordering and performing treatments and interventions, ordering and review of laboratory studies, ordering and review of radiographic studies, pulse oximetry and re-evaluation of patient's condition.   Medications Ordered in ED Medications  cefTRIAXone (ROCEPHIN) 1 g in sodium chloride 0.9 % 100 mL IVPB (has no administration in time range)    enoxaparin (LOVENOX) injection 30 mg (has no administration in time range)  insulin regular, human (MYXREDLIN) 100 units/ 100 mL infusion (has no administration in time range)  0.9 %  sodium chloride infusion (has no administration in time range)  dextrose 5 %-0.45 % sodium chloride infusion (has no administration in time range)  dextrose 50 % solution 0-50 mL (has no administration in time range)  sodium chloride 0.9 % bolus 1,000 mL (1,000 mLs Intravenous New Bag/Given 01/10/20 1918)  insulin aspart (novoLOG) injection 8 Units (8 Units Intravenous Given 01/10/20 1919)    ED Course  I have reviewed the triage vital signs and the nursing notes.  Pertinent labs & imaging results that were available during my care of the patient were reviewed by me and considered in my medical decision making (see chart for details).  69 year old female presents with hyperglycemia and feeling unwell. She has some URI symptoms and thrush. Also has polyuria/urinary frequency and polydipsia. CBG is >600 here. Vitals are reassuring. Will obtain labs, UA. Will give fluids and insulin.  CBC is normal. BMP is remarkable for glucose of 662, elevated anion gap (18), AKI (SCr 2.6). UA has >500 glucose, 5 ketones, many bacteria, 11-20 WBC and clumps although is contaminated. She is having urinary frequency therefore will opt to culture and treat this. Shared visit with Dr. Roslynn Amble. Will admit for further management  Discussed with Dr. Maudie Mercury who will admit for poss DKA, AKI, and poss UTI.  MDM Rules/Calculators/A&P                       Final Clinical Impression(s) / ED Diagnoses Final diagnoses:  Diabetic ketoacidosis without coma associated with other specified diabetes mellitus ([place])  Urinary tract infection without hematuria, site unspecified  Thrush  AKI (acute kidney injury) [facility])    Rx / DC Orders ED Discharge Orders    None       Recardo Evangelist, PA-C 01/10/20 2031    Lucrezia Starch,  MD 01/10/20 2300

## 2020-01-10 NOTE — ED Notes (Signed)
CBG Results of (HIGH >600) reported to [place], Therapist, sports.

## 2020-01-10 NOTE — H&P (Signed)
TRH H&P    Patient Demographics:    Michele Wang, is a 69 y.o. female  MRN: [id number]  DOB - [birth year]  Admit Date - 01/10/2020  Referring MD/NP/PA:  Janetta Hora  Outpatient Primary MD for the patient is [name], MD  [name]- nephrology  Patient coming from:  home  Chief complaint- hyperglycemia   HPI:    Michele Wang  is a 69 y.o. female,w h/o breast cancer, hypertension, hyperlipidemia, Dm2, CKD stage 3, apparently presents with feeling bad for the past 3 days.  Slight urinary frequency and dry mouth and thrust and went to urgent care where sugar noted to be elevated and sent to ER for evaluation.    In ED,  T 98.4, P 95  R 12 Bp 121/88  Pox 98% RA WEt 72.6 kg  Na 132, K4.5 Glucose 662 Bun 50, Creatinine 2.66 AG 18, Hco3 24 Wbc 6.9, Hgb 12.6, plt  342  Urinalysis wbc 11-20, many bacteria,    Pt will be admitted for hyperglycemia, acute lower uti, and thrush           Review of systems:    In addition to the HPI above,  No Fever-chills, No Headache, No changes with Vision or hearing, No problems swallowing food or Liquids, No Chest pain, Cough or Shortness of Breath, No Abdominal pain, No Nausea or Vomiting, bowel movements are regular, No Blood in stool or Urine, No dysuria, No new skin rashes or bruises, No new joints pains-aches,  No new weakness, tingling, numbness in any extremity, No recent weight gain or loss, No polyuria, polydypsia or polyphagia, No significant Mental Stressors.  All other systems reviewed and are negative.    Past History of the following :    Past Medical History:  Diagnosis Date  . Breast cancer ([place])   . CKD (chronic kidney disease)   . Diabetes mellitus without complication ([place])    Type II  . Hyperlipidemia   . Hypertension   . UTI (urinary tract infection)       Past Surgical History:  Procedure Laterality Date  . BREAST  LUMPECTOMY WITH RADIOACTIVE SEED LOCALIZATION Left 08/21/2019   Procedure: LEFT BREAST LUMPECTOMY WITH RADIOACTIVE SEED LOCALIZATION;  Surgeon: Jovita Kussmaul, MD;  Location: [facility];  Service: General;  Laterality: Left;  . CESAREAN SECTION     x2      Social History:      Social History   Tobacco Use  . Smoking status: Never Smoker  . Smokeless tobacco: Never Used  Substance Use Topics  . Alcohol use: No       Family History :     Family History  Problem Relation Age of Onset  . Aneurysm Mother   . Healthy Father        Home Medications:   Prior to Admission medications   Medication Sig Start Date End Date Taking? Authorizing Provider  Ascorbic Acid (VITAMIN C) 1000 MG tablet Take 1,000 mg by mouth daily.    [provider]  atorvastatin (LIPITOR) 40 MG tablet Take 1 tablet by mouth Monday - Friday Patient not taking: Reported on 12/26/2019 11/12/19   [name], MD  CALCIUM PO Take 1,000 mg by mouth daily.    [provider]  hydrOXYzine (ATARAX/VISTARIL) 10 MG tablet Take 1 tablet (10 mg total) by mouth 3 (three) times daily as needed. 12/16/19   [name], MD  Olmesartan-amLODIPine-HCTZ [facility]) 40-5-25 MG TABS Take 1 tablet by mouth daily. 08/26/19   [name], MD  sitaGLIPtin (JANUVIA) 100 MG tablet Take 1 tablet (100 mg total) by mouth daily. 09/03/19   [name], MD  tamoxifen (NOLVADEX) 20 MG tablet Take 1 tablet (20 mg total) by mouth daily. 10/07/19   Nicholas Lose, MD  Vitamin D, Cholecalciferol, 25 MCG (1000 UT) CAPS Take by mouth.    [provider]     Allergies:    No Known Allergies   Physical Exam:   Vitals  Blood pressure 121/88, pulse 95, temperature 98.4 F (36.9 C), temperature source Oral, resp. rate 18, height 5\' 5"  (1.651 m), weight 72.6 kg, SpO2 98 %.  1.  General: axoxo3  2. Psychiatric: euthymic  3. Neurologic: nonfocal  4. HEENMT:  Anicteric, pupils 1.27mm  symmetric, direct, consensual intact + thrush Neck: no jvd  5. Respiratory : CTAB  6. Cardiovascular : rrr s1, s2,   7. Gastrointestinal:  Abd: soft, nt, nd, +bs  8. Skin:  Ext: no c/c/e,  9.Musculoskeletal:  God ROM     Data Review:    CBC Recent Labs  Lab 01/10/20 1824  WBC 6.9  HGB 12.6  HCT 38.7  PLT 342  MCV 88.8  MCH 28.9  MCHC 32.6  RDW 13.0   ------------------------------------------------------------------------------------------------------------------  Results for orders placed or performed during the hospital encounter of 01/10/20 (from the past 48 hour(s))  CBG monitoring, ED     Status: Abnormal   Collection Time: 01/10/20  6:10 PM  Result Value Ref Range   Glucose-Capillary >600 (HH) 70 - 99 mg/dL  Basic metabolic panel     Status: Abnormal   Collection Time: 01/10/20  6:24 PM  Result Value Ref Range   Sodium 132 (L) 135 - 145 mmol/L   Potassium 4.5 3.5 - 5.1 mmol/L   Chloride 90 (L) 98 - 111 mmol/L   CO2 24 22 - 32 mmol/L   Glucose, Bld 662 (HH) 70 - 99 mg/dL    Comment: CRITICAL RESULT CALLED TO, READ BACK BY AND VERIFIED WITH: B OLDLAND,RN 1922 01/10/2020 D BRADLEY    BUN 50 (H) 8 - 23 mg/dL   Creatinine, Ser 2.66 (H) 0.44 - 1.00 mg/dL   Calcium 10.0 8.9 - 10.3 mg/dL   GFR calc non Af Amer 18 (L) >60 mL/min   GFR calc Af Amer 21 (L) >60 mL/min   Anion gap 18 (H) 5 - 15    Comment: Performed at [facility], [address], [place] [address]  CBC     Status: None   Collection Time: 01/10/20  6:24 PM  Result Value Ref Range   WBC 6.9 4.0 - 10.5 K/uL   RBC 4.36 3.87 - 5.11 MIL/uL   Hemoglobin 12.6 12.0 - 15.0 g/dL   HCT 38.7 36.0 - 46.0 %   MCV 88.8 80.0 - 100.0 fL   MCH 28.9 26.0 - 34.0 pg   MCHC 32.6 30.0 - 36.0 g/dL   RDW 13.0 11.5 - 15.5 %   Platelets 342 150 -  400 K/uL   nRBC 0.0 0.0 - 0.2 %    Comment: Performed at [facility], [place] [address], [place], [place] [address]  CBG monitoring, ED     Status:  Abnormal   Collection Time: 01/10/20  6:49 PM  Result Value Ref Range   Glucose-Capillary >600 (HH) 70 - 99 mg/dL  Urinalysis, Routine w reflex microscopic     Status: Abnormal   Collection Time: 01/10/20  7:00 PM  Result Value Ref Range   Color, Urine STRAW (A) YELLOW   APPearance HAZY (A) CLEAR   Specific Gravity, Urine 1.018 1.005 - 1.030   pH 5.0 5.0 - 8.0   Glucose, UA >=500 (A) NEGATIVE mg/dL   Hgb urine dipstick NEGATIVE NEGATIVE   Bilirubin Urine NEGATIVE NEGATIVE   Ketones, ur 5 (A) NEGATIVE mg/dL   Protein, ur NEGATIVE NEGATIVE mg/dL   Nitrite NEGATIVE NEGATIVE   Leukocytes,Ua SMALL (A) NEGATIVE   RBC / HPF 0-5 0 - 5 RBC/hpf   WBC, UA 11-20 0 - 5 WBC/hpf   Bacteria, UA MANY (A) NONE SEEN   Squamous Epithelial / LPF 6-10 0 - 5   WBC Clumps PRESENT    Mucus PRESENT     Comment: Performed at [facility], [place] [address] [place], [place] [address]    Chemistries  Recent Labs  Lab 01/10/20 1824  NA 132*  K 4.5  CL 90*  CO2 24  GLUCOSE 662*  BUN 50*  CREATININE 2.66*  CALCIUM 10.0   ------------------------------------------------------------------------------------------------------------------  ------------------------------------------------------------------------------------------------------------------ GFR: Estimated Creatinine Clearance: 20.2 mL/min (A) (by C-G formula based on SCr of 2.66 mg/dL (H)). Liver Function Tests: No results for input(s): AST, ALT, ALKPHOS, BILITOT, PROT, ALBUMIN in the last 168 hours. No results for input(s): LIPASE, AMYLASE in the last 168 hours. No results for input(s): AMMONIA in the last 168 hours. Coagulation Profile: No results for input(s): INR, PROTIME in the last 168 hours. Cardiac Enzymes: No results for input(s): CKTOTAL, CKMB, CKMBINDEX, TROPONINI in the last 168 hours. BNP (last 3 results) No results for input(s): PROBNP in the last 8760 hours. HbA1C: No results for input(s): HGBA1C in the last 72  hours. CBG: Recent Labs  Lab 01/10/20 1810 01/10/20 1849  GLUCAP >600* >600*   Lipid Profile: No results for input(s): CHOL, HDL, LDLCALC, TRIG, CHOLHDL, LDLDIRECT in the last 72 hours. Thyroid Function Tests: No results for input(s): TSH, T4TOTAL, FREET4, T3FREE, THYROIDAB in the last 72 hours. Anemia Panel: No results for input(s): VITAMINB12, FOLATE, FERRITIN, TIBC, IRON, RETICCTPCT in the last 72 hours.  --------------------------------------------------------------------------------------------------------------- Urine analysis:    Component Value Date/Time   COLORURINE STRAW (A) 01/10/2020 1900   APPEARANCEUR HAZY (A) 01/10/2020 1900   LABSPEC 1.018 01/10/2020 1900   PHURINE 5.0 01/10/2020 1900   GLUCOSEU >=500 (A) 01/10/2020 1900   HGBUR NEGATIVE 01/10/2020 1900   BILIRUBINUR NEGATIVE 01/10/2020 1900   BILIRUBINUR Negative 07/02/2019 1513   KETONESUR 5 (A) 01/10/2020 1900   PROTEINUR NEGATIVE 01/10/2020 1900   UROBILINOGEN 0.2 07/02/2019 1513   UROBILINOGEN 0.2 09/17/2009 0217   NITRITE NEGATIVE 01/10/2020 1900   LEUKOCYTESUR SMALL (A) 01/10/2020 1900      Imaging Results:    No results found.     Assessment & Plan:    Principal Problem:   Hyperglycemia Active Problems:   Type 2 diabetes mellitus with stage 3 chronic kidney disease, without long-term current use of insulin (HCC)   Pure hypercholesterolemia   Hypertension  Hyperglycemia NPO  Ns iv Insulin iv Check bmp q4h   Acute lower UTI Urine culture Rocephin 1gm iv qday  Thrush Nystatin  Dm2 Cont Januvia May need to add additional medication  Hypertension Cont Tribenzoar  Hyperlipidemia Pt states that she is not taking Lipitor      DVT Prophylaxis-   Lovenox - SCDs   AM Labs Ordered, also please review Full Orders  Family Communication: Admission, patients condition and plan of care including tests being ordered have been discussed with the patient who indicate understanding and  agree with the plan and Code Status.  Code Status:  FULL CODE per patient, attempted to contact spouse but no response  Admission status: Observation : Based on patients clinical presentation and evaluation of above clinical data, I have made determination that patient meets Observation criteria at this time.   Time spent in minutes : 70 minutes   Jani Gravel M.D on 01/10/2020 at 8:13 PM

## 2020-01-10 NOTE — ED Triage Notes (Signed)
Pt reports hyperglycemia. States she checked her blood sugar yesterday but must have been reading it wrong. Has not taken any medications today. Endorses frequent urination.

## 2020-01-11 DIAGNOSIS — B370 Candidal stomatitis: Secondary | ICD-10-CM

## 2020-01-11 DIAGNOSIS — E1121 Type 2 diabetes mellitus with diabetic nephropathy: Secondary | ICD-10-CM

## 2020-01-11 DIAGNOSIS — N179 Acute kidney failure, unspecified: Secondary | ICD-10-CM | POA: Diagnosis not present

## 2020-01-11 DIAGNOSIS — E1122 Type 2 diabetes mellitus with diabetic chronic kidney disease: Secondary | ICD-10-CM | POA: Diagnosis not present

## 2020-01-11 DIAGNOSIS — N1832 Chronic kidney disease, stage 3b: Secondary | ICD-10-CM

## 2020-01-11 LAB — BASIC METABOLIC PANEL
Anion gap: 14 (ref 5–15)
Anion gap: 10 (ref 5–15)
BUN: 47 mg/dL — ABNORMAL HIGH (ref 8–23)
BUN: 46 mg/dL — ABNORMAL HIGH (ref 8–23)
CO2: 24 mmol/L (ref 22–32)
CO2: 26 mmol/L (ref 22–32)
Calcium: 9.5 mg/dL (ref 8.9–10.3)
Calcium: 9 mg/dL (ref 8.9–10.3)
Chloride: 99 mmol/L (ref 98–111)
Chloride: 99 mmol/L (ref 98–111)
Creatinine, Ser: 2.06 mg/dL — ABNORMAL HIGH (ref 0.44–1.00)
Creatinine, Ser: 1.97 mg/dL — ABNORMAL HIGH (ref 0.44–1.00)
GFR calc Af Amer: 28 mL/min — ABNORMAL LOW (ref >60)
GFR calc Af Amer: 30 mL/min — ABNORMAL LOW (ref >60)
GFR calc non Af Amer: 24 mL/min — ABNORMAL LOW (ref >60)
GFR calc non Af Amer: 25 mL/min — ABNORMAL LOW (ref >60)
Glucose, Bld: 204 mg/dL — ABNORMAL HIGH (ref 70–99)
Glucose, Bld: 271 mg/dL — ABNORMAL HIGH (ref 70–99)
Potassium: 3.6 mmol/L (ref 3.5–5.1)
Potassium: 3.7 mmol/L (ref 3.5–5.1)
Sodium: 137 mmol/L (ref 135–145)
Sodium: 135 mmol/L (ref 135–145)

## 2020-01-11 LAB — GLUCOSE, CAPILLARY
Glucose-Capillary: 184 mg/dL — ABNORMAL HIGH (ref 70–99)
Glucose-Capillary: 210 mg/dL — ABNORMAL HIGH (ref 70–99)
Glucose-Capillary: 380 mg/dL — ABNORMAL HIGH (ref 70–99)
Glucose-Capillary: 216 mg/dL — ABNORMAL HIGH (ref 70–99)

## 2020-01-11 LAB — HIV ANTIBODY (ROUTINE TESTING W REFLEX): HIV Screen 4th Generation wRfx: NONREACTIVE

## 2020-01-11 LAB — HEMOGLOBIN A1C
Hgb A1c MFr Bld: 11.5 % — ABNORMAL HIGH (ref 4.8–5.6)
Mean Plasma Glucose: 283.35 mg/dL

## 2020-01-11 MED ORDER — INSULIN ASPART 100 UNIT/ML ~~LOC~~ SOLN
0.0000 [IU] | Freq: Three times a day (TID) | SUBCUTANEOUS | Status: DC
  Administered 2020-01-11: 3 [IU] via SUBCUTANEOUS
  Administered 2020-01-11: 9 [IU] via SUBCUTANEOUS

## 2020-01-11 MED ORDER — INSULIN STARTER KIT- PEN NEEDLES (ENGLISH)
1.0000 | Freq: Once | Status: AC
  Administered 2020-01-11: 1
  Filled 2020-01-11: qty 1

## 2020-01-11 MED ORDER — INSULIN ASPART 100 UNIT/ML ~~LOC~~ SOLN: 0.0000 [IU] | Freq: Every day | SUBCUTANEOUS | Status: DC

## 2020-01-11 MED ORDER — SODIUM CHLORIDE 0.9 % IV SOLN: INTRAVENOUS | Status: AC

## 2020-01-11 MED ORDER — LEVEMIR FLEXTOUCH 100 UNIT/ML ~~LOC~~ SOPN: 14.0000 [IU] | PEN_INJECTOR | Freq: Every day | SUBCUTANEOUS | 0 refills | Status: DC

## 2020-01-11 MED ORDER — INSULIN DETEMIR 100 UNIT/ML ~~LOC~~ SOLN
14.0000 [IU] | Freq: Every day | SUBCUTANEOUS | Status: DC
  Administered 2020-01-11: 14 [IU] via SUBCUTANEOUS
  Filled 2020-01-11: qty 0.14

## 2020-01-11 MED ORDER — "PEN NEEDLES 3/16"" 31G X 5 MM MISC": 0 refills | Status: DC

## 2020-01-11 MED ORDER — NYSTATIN 100000 UNIT/ML MT SUSP: 5.0000 mL | Freq: Four times a day (QID) | OROMUCOSAL | 0 refills | Status: DC

## 2020-01-11 MED FILL — LEVEMIR FLEXTOUCH 100 UNITS: 100 | 22 days supply | Qty: 3 | Fill #0

## 2020-01-11 MED FILL — UNIFINE PENTIPS 31GX3/16: 31G X 5 MM | 90 days supply | Qty: 100 | Fill #0

## 2020-01-11 MED FILL — UNIFINE PENTIPS 31GX3/16": 31G X 5 MM | 90 days supply | Qty: 100 | Fill #0

## 2020-01-11 NOTE — Progress Notes (Signed)
Inpatient Diabetes Program Recommendations  AACE/ADA: New Consensus Statement on Inpatient Glycemic Control (2015)  Target Ranges:  Prepandial:   less than 140 mg/dL      Peak postprandial:   less than 180 mg/dL (1-2 hours)      Critically ill patients:  140 - 180 mg/dL   Results for Michele, Wang (MRN [id number]) as of 01/11/2020 09:06  Ref. Range 01/10/2020 18:10 01/10/2020 18:49 01/10/2020 20:49 01/10/2020 21:27 01/10/2020 22:20 01/10/2020 23:26 01/11/2020 03:56 01/11/2020 08:12  Glucose-Capillary Latest Ref Range: 70 - 99 mg/dL >600 (HH) >600 (HH)  8 units NOVOLOG Given at 7:19pm 468 (H) 486 (H)  IV Insulin Drip 338 (H)  IV Insulin Drip 188 (H)  IV Insulin Drip OFF +  2 units NOVOLOG  184 (H)  2 units NOVOLOG  210 (H)   Results for Michele, Wang (MRN [id number]) as of 01/11/2020 09:06  Ref. Range 08/08/2019 10:51 11/06/2019 14:30 01/11/2020 05:27  Hemoglobin A1C Latest Ref Range: 4.8 - 5.6 % 7.1 (H) 7.1 (H) 11.5 (H)    Admit with: UTI/ Severe Hyperglycemia  History: DM, CKD, Breast Cancer  Home DM Meds: Januvia 100 mg Daily  Current Orders: Levemir 14 units Daily      Novolog Sensitive Correction Scale/ SSI (0-9 units) TID AC + HS      Tradjenta 5 mg Daily     Glucose 662 mg/dl on admit.  Current A1c shows significant elevation since December.  Levemir insulin to start this AM (0.2 units/kg).  Spoke w/ Dr. Algis Liming by phone this AM about patient.  Would like to send home with Home Januvia and Levemir once daily.  Placed TOC consult to see if [facility] team can perform benefits check to see if pt can get Levemir with her Medicare/[employer] benefits.  Spoke w/ pt by phone this AM (DM Coordinator not on campus over the weekend).  Pt told me she sees Dr. Baird Cancer for medical care.  Was taken off her Janumet about 4 months ago and started on Januvia 100 mg daily.  Does not check CBGs at home but does have CBG meter and supplies.  Has not been following a diabetes diet and has been drinking  a lot of tea with sugar lately.  Told me she knows she needs to be more serious about her diabetes and take better care of herself.  Discussed DM diet information with patient.  Encouraged patient to avoid beverages with sugar (regular soda, sweet tea, lemonade, fruit juice) and to consume mostly water.  Discussed what foods contain carbohydrates and how carbohydrates affect the body's blood sugar levels.  Encouraged patient to be careful with her portion sizes (especially grains, starchy vegetables, and fruits).  Encouraged consumption of lean protein/meats and more green vegetables.  Explained to patient that women should have 45-60 grams of carbohydrates per meal per day.  Also discussed with pt what Levemir insulin is, how to take, when to take, the importance of consistency of the timing on Levemir (same time every day).  Also reviewed signs and symptoms of Hypoglycemia and how to treat at home.  Educational materials attached to d/c instructions.  Asked RN caring for pt today to please allow pt to draw up and administer all insulin injections for prcatice and placed TOC consult to check to see if Levemir can be covered by her benefits.    --Will follow patient during hospitalization--  Wyn Quaker RN, MSN, CDE Diabetes Coordinator Inpatient Glycemic Control Team Team Pager: [phone] (8a-5p)

## 2020-01-11 NOTE — Discharge Instructions (Addendum)
Fingerstick glucose (sugar) goals for home: Before meals: 80-130 mg/dl 2-Hours after meals: less than 180 mg/dl Hemoglobin A1c goal: 7% or less  Insulin Pen Teaching Video:  [email or web address]  Symptoms of Hypoglycemia: Silly, Sweaty, Shaky Check sugar if you have your meter.  If near or less than 70 mg/dl, treat with 1/2 cup juice or soda or take glucose tablets Check sugar 15 minutes after treatment.  If sugar still near or less than 70 mg/al and symptomatic, treat again and may need a snack with some protein (peanut butter with crackers, etc)    Insulin Injection Instructions, Using Insulin Pens, Adult A subcutaneous injection is a shot of medicine that is injected into the layer of fat and tissue between skin and muscle. People with type 1 diabetes must take insulin because their bodies do not make it. People with type 2 diabetes may need to take insulin. There are many different types of insulin. The type of insulin that you take may determine how many injections you give yourself and when you need to give the injections. Supplies needed:  Soap and water to wash hands.  Your insulin pen.  A new, unused needle.  Alcohol wipes.  A disposal container that is meant for sharp items (sharps container), such as an empty plastic bottle with a cover. How to choose a site for injection  The body absorbs insulin differently, depending on where the insulin is injected (injection site). It is best to inject insulin into the same body area each time (for example, always in the abdomen), but you should use a different spot in that area for each injection. Do not inject the insulin in the same spot each time. There are five main areas that can be used for injecting. These areas include:  Abdomen. This is the preferred area.  Front of thigh.  Upper, outer side of thigh.  Upper, outer side of arm.  Upper, outer part of buttock. How to use an insulin  pen   First, follow the steps for Get ready, then continue with the steps for Inject the insulin. Get ready 1. Wash your hands with soap and water. If soap and water are not available, use hand sanitizer. 2. Before you give yourself an insulin injection, be sure to test your blood sugar level (blood glucose level) and write down that number. Follow any instructions from your health care provider about what to do if your blood glucose level is higher or lower than your normal range. 3. Check the expiration date and the type of insulin that is in the pen. 4. If you are using CLEAR insulin, check to see that it is clear and free of clumps. 5. If you are using CLOUDY insulin, do not shake the pen to get the injection ready. Instead, get it ready in one of these ways: ? Gently roll the pen between your palms several times. ? Tip the pen up and down several times. 6. Remove the cap from the insulin pen. 7. Use an alcohol wipe to clean the rubber tip of the pen. 8. Remove the protective paper tab from the disposable needle. Do not let the needle touch anything. 9. Screw a new, unused needle onto the pen. 10. Remove the outer plastic needle cover. Do not throw away the outer plastic cover yet. ? If the pen uses a special safety needle, leave the inner needle shield in place. ? If the pen does not use a special safety needle, remove the inner plastic cover  from the needle. 11. Follow the manufacturer's instructions to prime the insulin pen with the volume of insulin needed. Hold the pen with the needle pointing up, and push the button on the opposite end of the pen until a drop of insulin appears at the needle tip. If no insulin appears, repeat this step. 12. Turn the button (dial) to the number of units of insulin that you will be injecting. Inject the insulin 1. Use an alcohol wipe to clean the site where you will be injecting the needle. Let the site air-dry. 2. Hold the pen in the palm of your  writing hand like a pencil. 3. If directed by your health care provider, use your other hand to pinch and hold about an inch (2.5 cm) of skin at the injection site. Do not directly touch the cleaned part of the skin. 4. Gently but quickly, use your writing hand to put the needle straight into the skin. The needle should be at a 90-degree angle (perpendicular) to the skin. 5. When the needle is completely inserted into the skin, use your thumb or index finger of your writing hand to push the top button of the pen down all the way to inject the insulin. 6. Let go of the skin that you are pinching. Continue to hold the pen in place with your writing hand. 7. Wait 10 seconds, then pull the needle straight out of the skin. This will allow all of the insulin to go from the pen and needle into your body. 8. Carefully put the larger (outer) plastic cover of the needle back over the needle, then unscrew the capped needle and discard it in a sharps container, such as an empty plastic bottle with a cover. 9. Put the plastic cap back on the insulin pen. How to throw away supplies  Discard all used needles in a puncture-proof sharps disposal container. You can ask your local pharmacy about where you can get this kind of disposal container, or you can use an empty plastic liquid laundry detergent bottle that has a cover.  Follow the disposal regulations for the area where you live. Do not use any needle more than one time.  Throw away empty disposable pens in the regular trash. Questions to ask your health care provider  How often should I be taking insulin?  How often should I check my blood glucose?  What amount of insulin should I be taking at each time?  What are the side effects?  What should I do if my blood glucose is too high?  What should I do if my blood glucose is too low?  What should I do if I forget to take my insulin?  What number should I call if I have questions? Where to find more  information  American Diabetes Association (ADA): www.diabetes.org  American Association of Diabetes Educators (AADE) Patient Resources: https://www.diabeteseducator.org Summary  A subcutaneous injection is a shot of medicine that is injected into the layer of fat and tissue between skin and muscle.  Before you give yourself an insulin injection, be sure to test your blood sugar level (blood glucose level) and write down that number.  Check the expiration date and the type of insulin that is in the pen. The type of insulin that you take may determine how many injections you give yourself and when you need to give the injections.  It is best to inject insulin into the same body area each time (for example, always in the  abdomen), but you should use a different spot in that area for each injection. This information is not intended to replace advice given to you by your health care provider. Make sure you discuss any questions you have with your health care provider. Document Revised: 12/04/2017 Document Reviewed: 12/18/2015 Elsevier Patient Education  [place].   Additional discharge instructions  Please get your medications reviewed and adjusted by your Primary MD.  Please request your Primary MD to go over all Hospital Tests and Procedure/Radiological results at the follow up, please get all Hospital records sent to your Prim MD by signing hospital release before you go home.  If you had Pneumonia of Lung problems at the Hospital: Please get a 2 view Chest X ray done in 6-8 weeks after hospital discharge or sooner if instructed by your Primary MD.  If you have Congestive Heart Failure: Please call your Cardiologist or Primary MD anytime you have any of the following symptoms:  1) 3 pound weight gain in 24 hours or 5 pounds in 1 week  2) shortness of breath, with or without a dry hacking cough  3) swelling in the hands, feet or stomach  4) if you have to sleep on extra  pillows at night in order to breathe  Follow cardiac low salt diet and 1.5 lit/day fluid restriction.  If you have diabetes Accuchecks 4 times/day, Once in AM empty stomach and then before each meal. Log in all results and show them to your primary doctor at your next visit. If any glucose reading is under 80 or above 300 call your primary MD immediately.  If you have Seizure/Convulsions/Epilepsy: Please do not drive, operate heavy machinery, participate in activities at heights or participate in high speed sports until you have seen by Primary MD or a Neurologist and advised to do so again.  If you had Gastrointestinal Bleeding: Please ask your Primary MD to check a complete blood count within one week of discharge or at your next visit. Your endoscopic/colonoscopic biopsies that are pending at the time of discharge, will also need to followed by your Primary MD.  Get Medicines reviewed and adjusted. Please take all your medications with you for your next visit with your Primary MD  Please request your Primary MD to go over all hospital tests and procedure/radiological results at the follow up, please ask your Primary MD to get all Hospital records sent to his/her office.  If you experience worsening of your admission symptoms, develop shortness of breath, life threatening emergency, suicidal or homicidal thoughts you must seek medical attention immediately by calling 911 or calling your MD immediately  if symptoms less severe.  You must read complete instructions/literature along with all the possible adverse reactions/side effects for all the Medicines you take and that have been prescribed to you. Take any new Medicines after you have completely understood and accpet all the possible adverse reactions/side effects.   Do not drive or operate heavy machinery when taking Pain medications.   Do not take more than prescribed Pain, Sleep and Anxiety Medications  Special Instructions: If you  have smoked or chewed Tobacco  in the last 2 yrs please stop smoking, stop any regular Alcohol  and or any Recreational drug use.  Wear Seat belts while driving.  Please note You were cared for by a hospitalist during your hospital stay. If you have any questions about your discharge medications or the care you received while you were in the hospital after you  are discharged, you can call the unit and asked to speak with the hospitalist on call if the hospitalist that took care of you is not available. Once you are discharged, your primary care physician will handle any further medical issues. Please note that NO REFILLS for any discharge medications will be authorized once you are discharged, as it is imperative that you return to your primary care physician (or establish a relationship with a primary care physician if you do not have one) for your aftercare needs so that they can reassess your need for medications and monitor your lab values.  You can reach the hospitalist office at phone [phone] or fax [phone]   If you do not have a primary care physician, you can call [phone] for a physician referral.

## 2020-01-11 NOTE — Care Management (Signed)
Patient's insurance coverage includes Medicare A/B and a VA policy for prescriptions.  Since patient does not have Medicare D coverage, all scripts will be cash when filled at [facility].  Levemir and Lantus vials are $300/vial at [place].    Patient given [facility] letter as she has no  option to fill scripts except for the [facility].  Scripts sent to [place] and family member will pick up today before 4pm.  [facility] letter faxed to [place].

## 2020-01-11 NOTE — Progress Notes (Signed)
Patient's daughter Lenna Sciara at bedside.  She stated she picked up the Levemir from the [facility], that she watched the levemir insulin pen video and believes she knows how to do it.  She also stated her friend is a Marine scientist at [employer] and she can help her if she has any questions.   IV removed.  Heart monitor d/c'd & CCMD notified.   Patient assisted to private vehicle by staff.

## 2020-01-11 NOTE — Progress Notes (Signed)
Insulin administration teaching completed with patient.  Noted hesitancy during injection.  Asked patient if there was someone who could help.  She stated her husband could but her daughter would be better.   Spoke to daughter and asked her to view tube video on using a insulin pen.

## 2020-01-11 NOTE — Discharge Summary (Signed)
Physician Discharge Summary  Michele Wang CHY:[id number] DOB: 1951-09-23  PCP: [name], MD  Admitted from: Home Discharged to: Home  Admit date: 01/10/2020 Discharge date: 01/11/2020  Recommendations for Outpatient Follow-up:   Follow-up Information    [name], MD. Schedule an appointment as soon as possible for a visit in 3 day(s).   Specialty: Internal Medicine Why: To be seen with repeat labs (CBC & BMP). Contact information: [address] [place] [address] [phone]        Justin Mend, MD. Schedule an appointment as soon as possible for a visit in 1 week(s).   Specialty: Internal Medicine Contact information: [place] [address] [place]: None Equipment/Devices: None  Discharge Condition: Improved and stable. CODE STATUS: Full. Diet recommendation: Heart healthy & diabetic diet.  Discharge Diagnoses:  Principal Problem:   Hyperglycemia Active Problems:   Type 2 diabetes mellitus with stage 3 chronic kidney disease, without long-term current use of insulin (HCC)   Pure hypercholesterolemia   Hypertension   Acute lower UTI   Brief Summary: 69 year old married female, retired, independent, PMH of type II DM, HTN, HLD, stage III CKD, breast cancer on tamoxifen, presented to the [facility] ED on 01/10/2020 due to polyuria, polydipsia, some dizziness, generalized weakness, dry mouth, sore mouth for which she went to an urgent care center where her blood sugars were noted to be elevated and she was sent to the ED for evaluation.  In the ED, vital signs stable, lab work showed glucose of 662, sodium 132, bicarbonate 24, BUN 50, creatinine 2.66.  Although urine analysis showed many bacteria and pyuria, she had no clinical UTI.  She was admitted for poorly controlled type II DM with hyperglycemia, no DKA, dehydration, acute kidney injury complicating stage III chronic kidney disease  and oral thrush.  Assessment and plan:  1. Poorly controlled type II DM with HLD and renal complications: Patient reports that she follows only with her PCP and does not have an Endocrinologist.  Reportedly diabetic for about 12 years.  States that her last A1c in December 2020 was in the 7 range.  Claims compliance with medications.  Was on Janumet in the past which may have been discontinued due to her chronic kidney disease.  Currently on Januvia.  She is noncompliant with her diet and does not check her CBGs.  Suspect her poor diet contributed to worsening diabetes but it is also possible that she is now transitioning to insulin requiring diabetes.  She was treated with IV fluids, brief IV insulin drip followed by NovoLog SSI with which her glycemic control quickly improved.  Her A1c now is 11.5.  Given her markedly uncontrolled DM, advised her that it is reasonable to start insulins at least on a short-term, close outpatient follow-up with her PCP regarding further management.  She was agreeable.  After extensive discussion with DM coordinator, patient initiated on Levemir 14 units daily.  Patient was counseled by DM coordinator regarding diet and insulins.  RN educated patient regarding insulin self administration.  Case manager has assisted with medication procurement through the [facility].  Recommend close outpatient follow-up with PCP early next week.  She got her Levemir dose midmorning and likely has not had its full effect.  I had a very long and detailed discussion with patient's daughter via phone, updated care and answered all questions and advised  her regarding importance of close outpatient follow-up with PCP, compliance with all aspects of DM care and it may take a few days or even a couple of weeks with adjustment of medications to get her diabetes well controlled.  She verbalized understanding.  Daughter is also picked up patient's prescriptions from the pharmacy.   Patient apparently had oral nystatin at home prescribed recently and hence did not pick up that prescription 2. Dehydration: Secondary to marked hyperglycemia and HCTZ.  Improved after IV fluids.  Encouraged oral fluid intake liberally. 3. Acute kidney injury complicating stage IIIb chronic kidney disease: Baseline creatinine may be in the 1.8 range.  She came with creatinine of 2.66 which after IV fluids improved to 1.9 and approaching baseline.  ARB and HCTZ were held for today.  May resume Tribenzor from tomorrow.  Follow BMP in a few days as outpatient.  Also to follow-up with her Nephrologist, Dr. [name]. 4. Essential hypertension: ARB and HCTZ were held briefly held in the hospital due to acute kidney injury.  Amlodipine was continued.  Resume Tribenzor at discharge. 5. Hyperlipidemia: PCP has reportedly taken her off statins.  Follow-up with PCP. 6. Oral thrush: Continue and complete a course of oral nystatin.  Tolerating diet well. 7. Breast cancer: Follows with oncology.  Continue tamoxifen. 8. Asymptomatic bacteriuria: Patient denies any UTI symptoms.  Empirically started ceftriaxone discontinued.  Consultations:  None  Procedures:  None   Discharge Instructions  Discharge Instructions    Call MD for:  extreme fatigue   Complete by: As directed    Call MD for:  persistant dizziness or light-headedness   Complete by: As directed    Call MD for:  persistant nausea and vomiting   Complete by: As directed    Diet - low sodium heart healthy   Complete by: As directed    Diet Carb Modified   Complete by: As directed    Increase activity slowly   Complete by: As directed        Medication List    STOP taking these medications   atorvastatin 40 MG tablet Commonly known as: LIPITOR   hydrOXYzine 10 MG tablet Commonly known as: ATARAX/VISTARIL     TAKE these medications   Levemir FlexTouch 100 UNIT/ML Pen Generic drug: Insulin Detemir Inject 14 Units into the skin  daily. Start taking on: January 12, 2020   nystatin 100000 UNIT/ML suspension Commonly known as: MYCOSTATIN Use as directed 5 mLs (500,000 Units total) in the mouth or throat 4 (four) times daily.   Olmesartan-amLODIPine-HCTZ 40-5-25 MG Tabs Commonly known as: Tribenzor Take 1 tablet by mouth daily.   Pen Needles 3/16" 31G X 5 MM Misc Use as directed, once daily.   sitaGLIPtin 100 MG tablet Commonly known as: Januvia Take 1 tablet (100 mg total) by mouth daily.   tamoxifen 20 MG tablet Commonly known as: NOLVADEX Take 1 tablet (20 mg total) by mouth daily.   Vitamin D (Cholecalciferol) 25 MCG (1000 UT) Caps Take 1,000 Units by mouth daily.      No Known Allergies    Procedures/Studies: No results found.    Subjective: Patient reports that she is feeling much better compared to initial arrival to the hospital.  Urinary frequency has decreased.  Denies dysuria, abdominal pain, fever or chills.  Denies dizziness, lightheadedness.  Never passed out or felt like passing out.  No chest pain or dyspnea.  No mouth pain reported.  Discharge Exam:  Vitals:   01/10/20 2333 01/11/20  0357 01/11/20 0809 01/11/20 1152  BP: 139/90 119/86 125/90 117/78  Pulse: 73 62 69 74  Resp: 16 15 14 12   Temp: 97.6 F (36.4 C) 98 F (36.7 C) 97.8 F (36.6 C) 98.5 F (36.9 C)  TempSrc: Oral Oral Oral Oral  SpO2: 99% 99% 99% 96%  Weight:      Height:        General: Middle-aged female, moderately built and nourished sitting up comfortably in bed without distress.  Oral mucosa moist. Cardiovascular: S1 & S2 heard, RRR, S1/S2 +. No murmurs, rubs, gallops or clicks. No JVD or pedal edema.  Telemetry personally reviewed: Sinus rhythm Respiratory: Clear to auscultation without wheezing, rhonchi or crackles. No increased work of breathing. Abdominal:  Non distended, non tender & soft. No organomegaly or masses appreciated. Normal bowel sounds heard. CNS: Alert and oriented. No focal  deficits. Extremities: no edema, no cyanosis    The results of significant diagnostics from this hospitalization (including imaging, microbiology, ancillary and laboratory) are listed below for reference.     Microbiology: Recent Results (from the past 240 hour(s))  Respiratory Panel by RT PCR (Flu A&B, Covid) - Nasopharyngeal Swab     Status: None   Collection Time: 01/10/20  8:26 PM   Specimen: Nasopharyngeal Swab  Result Value Ref Range Status   SARS Coronavirus 2 by RT PCR NEGATIVE NEGATIVE Final    Comment: (NOTE) SARS-CoV-2 target nucleic acids are NOT DETECTED. The SARS-CoV-2 RNA is generally detectable in upper respiratoy specimens during the acute phase of infection. The lowest concentration of SARS-CoV-2 viral copies this assay can detect is 131 copies/mL. A negative result does not preclude SARS-Cov-2 infection and should not be used as the sole basis for treatment or other patient management decisions. A negative result may occur with  improper specimen collection/handling, submission of specimen other than nasopharyngeal swab, presence of viral mutation(s) within the areas targeted by this assay, and inadequate number of viral copies (<131 copies/mL). A negative result must be combined with clinical observations, patient history, and epidemiological information. The expected result is Negative. Fact Sheet for Patients:  PinkCheek.be Fact Sheet for Healthcare Providers:  GravelBags.it This test is not yet ap proved or cleared by the [place] FDA and  has been authorized for detection and/or diagnosis of SARS-CoV-2 by FDA under an Emergency Use Authorization (EUA). This EUA will remain  in effect (meaning this test can be used) for the duration of the COVID-19 declaration under Section 564(b)(1) of the Act, 21 U.S.C. section 360bbb-3(b)(1), unless the authorization is terminated or revoked sooner.     Influenza A by PCR NEGATIVE NEGATIVE Final   Influenza B by PCR NEGATIVE NEGATIVE Final    Comment: (NOTE) The Xpert Xpress SARS-CoV-2/FLU/RSV assay is intended as an aid in  the diagnosis of influenza from Nasopharyngeal swab specimens and  should not be used as a sole basis for treatment. Nasal washings and  aspirates are unacceptable for Xpert Xpress SARS-CoV-2/FLU/RSV  testing. Fact Sheet for Patients: PinkCheek.be Fact Sheet for Healthcare Providers: GravelBags.it This test is not yet approved or cleared by the [place] FDA and  has been authorized for detection and/or diagnosis of SARS-CoV-2 by  FDA under an Emergency Use Authorization (EUA). This EUA will remain  in effect (meaning this test can be used) for the duration of the  Covid-19 declaration under Section 564(b)(1) of the Act, 21  U.S.C. section 360bbb-3(b)(1), unless the authorization is  terminated or revoked. Performed at [facility], [place] [address] [place], [place] [address]      Labs: CBC: Recent Labs  Lab 01/10/20 1824  WBC 6.9  HGB 12.6  HCT 38.7  MCV 88.8  PLT 299    Basic Metabolic Panel: Recent Labs  Lab 01/10/20 1824 01/11/20 0527 01/11/20 1504  NA 132* 137 135  K 4.5 3.6 3.7  CL 90* 99 99  CO2 24 24 26   GLUCOSE 662* 204* 271*  BUN 50* 47* 46*  CREATININE 2.66* 2.06* 1.97*  CALCIUM 10.0 9.5 9.0    Liver Function Tests: No results for input(s): AST, ALT, ALKPHOS, BILITOT, PROT, ALBUMIN in the last 168 hours.  CBG: Recent Labs  Lab 01/10/20 2326 01/11/20 0356 01/11/20 0812 01/11/20 1158 01/11/20 1625  GLUCAP 188* 184* 210* 380* 216*    Hgb A1c Recent Labs    01/11/20 0527  HGBA1C 11.5*     Urinalysis    Component Value Date/Time   COLORURINE STRAW (A) 01/10/2020 1900   APPEARANCEUR HAZY (A) 01/10/2020 1900   LABSPEC 1.018 01/10/2020 1900   PHURINE 5.0 01/10/2020 1900   GLUCOSEU >=500 (A)  01/10/2020 1900   HGBUR NEGATIVE 01/10/2020 1900   BILIRUBINUR NEGATIVE 01/10/2020 1900   BILIRUBINUR Negative 07/02/2019 1513   KETONESUR 5 (A) 01/10/2020 1900   PROTEINUR NEGATIVE 01/10/2020 1900   UROBILINOGEN 0.2 07/02/2019 1513   UROBILINOGEN 0.2 09/17/2009 0217   NITRITE NEGATIVE 01/10/2020 1900   LEUKOCYTESUR SMALL (A) 01/10/2020 1900      Time coordinating discharge: 40 minutes  SIGNED:  Vernell Leep, MD, FACP, [facility]. [facility]  To contact the attending provider between 7A-7P or the covering provider during after hours 7P-7A, please log into the web site [email or web address] and access using universal [place] password for that web site. If you do not have the password, please call the hospital operator.

## 2020-01-12 ENCOUNTER — Other Ambulatory Visit: Payer: Self-pay | Admitting: Internal Medicine

## 2020-01-12 MED ORDER — CEPHALEXIN 250 MG PO CAPS: 250.0000 mg | ORAL_CAPSULE | Freq: Two times a day (BID) | ORAL | 0 refills | Status: AC

## 2020-01-13 ENCOUNTER — Telehealth: Payer: Self-pay

## 2020-01-13 MED ORDER — LEVEMIR FLEXTOUCH 100 UNIT/ML ~~LOC~~ SOPN: 17.0000 [IU] | PEN_INJECTOR | Freq: Every day | SUBCUTANEOUS | 0 refills | Status: DC

## 2020-01-13 NOTE — Telephone Encounter (Signed)
Transition Care Management Follow-up Telephone Call  Date of discharge and from where:01/11/20  How have you been since you were released from the hospital? Better   Any questions or concerns? No   Items Reviewed:  Did the pt receive and understand the discharge instructions provided? Yes   Medications obtained and verified?yes   Any new allergies since your discharge?no   Dietary orders reviewed?no  Do you have support at home?yes   Other (ie: DME, Home Health, etc) no  Functional Questionnaire: (I = Independent and D = Dependent) ADL's: i  Bathing/Dressing-i  Meal Prep- i Eating- i  Maintaining continence- i  Transferring/Ambulation- i Managing Meds-i   Follow up appointments reviewed:    PCP Hospital f/u appt confirmed? Yes  Scheduled to seeSanders on 01/16/20  Specialist Hospital f/u appt confirmed?no   Are transportation arrangements needed? no  If their condition worsens, is the pt aware to call  their PCP or go to the ED?yes   Was the patient provided with contact information for the PCP's office or ED?yes   Was the pt encouraged to call back with questions or concerns? Yes

## 2020-01-13 NOTE — Telephone Encounter (Signed)
Ready to close out 

## 2020-01-13 NOTE — Telephone Encounter (Signed)
Left vm for pt to return call    PLS PERFORM TCM PHONE CALL/  SCHEDULE APPT WITH ME ON WED. - NEEDS TO BE IN PERSON B/C BLOODWORK IS NEEDED   Call patient first thing Monday - what was her blood sugar reading? If above 200, increase Levemir to 17 units nightly. Avoid sugary beverages. Increase water intake. Continue with amlodipine. May take 1/2 Tribenzor on Monday and Tuesday. (if she has taken already, it is okay) will need to switch her to another medication without water pill.

## 2020-01-14 LAB — URINE CULTURE: Culture: >=100000 — AB

## 2020-01-14 NOTE — Progress Notes (Signed)
  Radiation Oncology         [phone] ________________________________  Name: Michele Wang MRN: [id number]  Date: 10/11/2019  DOB: 12-21-1950  End of Treatment Note  Diagnosis:   left-sided breast cancer     Indication for treatment:  Curative       Radiation treatment dates:   09/16/19 - 10/11/19  Site/dose:   The patient initially received a dose of 42.56 Gy in 16 fractions to the breast using whole-breast tangent fields. This was delivered using a 3-D conformal technique. The patient then received a boost to the seroma. This delivered an additional 8 Gy in 35fractions using a 3 field photon technique due to the depth of the seroma. The total dose was 50.56 Gy.  Narrative: The patient tolerated radiation treatment relatively well.   The patient had some expected skin irritation as she progressed during treatment. Moist desquamation was not present at the end of treatment.  Plan: The patient has completed radiation treatment. The patient will return to radiation oncology clinic for routine followup in one month. I advised the patient to call or return sooner if they have any questions or concerns related to their recovery or treatment. ________________________________  Jodelle Gross, M.D., Ph.D.

## 2020-01-15 ENCOUNTER — Inpatient Hospital Stay: Payer: Medicare PPO | Admitting: Internal Medicine

## 2020-01-15 DIAGNOSIS — D0512 Intraductal carcinoma in situ of left breast: Secondary | ICD-10-CM | POA: Diagnosis not present

## 2020-01-16 ENCOUNTER — Other Ambulatory Visit: Payer: Self-pay

## 2020-01-16 ENCOUNTER — Other Ambulatory Visit

## 2020-01-16 ENCOUNTER — Encounter: Payer: Self-pay | Admitting: Internal Medicine

## 2020-01-16 ENCOUNTER — Inpatient Hospital Stay: Payer: Medicare PPO | Admitting: Internal Medicine

## 2020-01-16 ENCOUNTER — Telehealth (INDEPENDENT_AMBULATORY_CARE_PROVIDER_SITE_OTHER): Payer: Medicare PPO | Admitting: Internal Medicine

## 2020-01-16 VITALS — Ht 65.0 in

## 2020-01-16 DIAGNOSIS — N390 Urinary tract infection, site not specified: Secondary | ICD-10-CM | POA: Diagnosis not present

## 2020-01-16 DIAGNOSIS — E1122 Type 2 diabetes mellitus with diabetic chronic kidney disease: Secondary | ICD-10-CM

## 2020-01-16 DIAGNOSIS — I700 Atherosclerosis of aorta: Secondary | ICD-10-CM

## 2020-01-16 DIAGNOSIS — N183 Chronic kidney disease, stage 3 unspecified: Secondary | ICD-10-CM | POA: Diagnosis not present

## 2020-01-16 DIAGNOSIS — E860 Dehydration: Secondary | ICD-10-CM

## 2020-01-16 DIAGNOSIS — Z09 Encounter for follow-up examination after completed treatment for conditions other than malignant neoplasm: Secondary | ICD-10-CM

## 2020-01-16 DIAGNOSIS — R9389 Abnormal findings on diagnostic imaging of other specified body structures: Secondary | ICD-10-CM | POA: Diagnosis not present

## 2020-01-16 MED ORDER — ATORVASTATIN CALCIUM 40 MG PO TABS: ORAL_TABLET | ORAL | 1 refills | Status: DC

## 2020-01-16 NOTE — Progress Notes (Addendum)
Virtual Visit via Video   This visit type was conducted due to national recommendations for restrictions regarding the COVID-19 Pandemic (e.g. social distancing) in an effort to limit this patient's exposure and mitigate transmission in our community.  Due to her co-morbid illnesses, this patient is at least at moderate risk for complications without adequate follow up.  This format is felt to be most appropriate for this patient at this time.  All issues noted in this document were discussed and addressed.  A limited physical exam was performed with this format.    This visit type was conducted due to national recommendations for restrictions regarding the COVID-19 Pandemic (e.g. social distancing) in an effort to limit this patient's exposure and mitigate transmission in our community.  Patients identity confirmed using two different identifiers.  This format is felt to be most appropriate for this patient at this time.  All issues noted in this document were discussed and addressed.  No physical exam was performed (except for noted visual exam findings with Video Visits).    Date:  01/16/2020   ID:  Michele Wang, DOB [birth year], MRN [id number]  Patient Location:  Home  Provider location:   Office    Chief Complaint:  "I have hospital f/u"  History of Present Illness:    Michele Wang is a 69 y.o. female who presents via video conferencing for a telehealth visit today.    The patient does not have symptoms concerning for COVID-19 infection (fever, chills, cough, or new shortness of breath).   She presents today for virtual visit. She prefers this method of contact due to COVID-19 pandemic.  She presents today for hospital f/u. She was admitted to [facility] hospital on 01/10/20 for further evaluation of polyuria, polydipsia, some dizziness, generalized weakness and dry mouth. She was seen at Urgent care on the same day, for similar symptoms. They did not express need for her to go to ER. Her  daughter contacted on call MD for further advice and she was advised to take her Mom to ER for iv fluids due to markedly elevated BS   In the ED, vital signs stable, lab work showed glucose of 662, sodium 132, bicarbonate 24, BUN 50, creatinine 2.66.  Although urine analysis showed many bacteria and pyuria, she had no clinical UTI.  She was admitted for poorly controlled type II DM with hyperglycemia without ketoacidosis, dehydration, acute kidney injury complicating stage III chronic kidney disease and oral thrush.  She was discharged in stable condition on 01/11/20. She had consult with diabetes educator and she was started on insulin - Levemir, 14 units nightly. Her renal function improved with iv fluids.     Past Medical History:  Diagnosis Date  . Breast cancer ([place])   . CKD (chronic kidney disease)   . Diabetes mellitus without complication ([place])    Type II  . Hyperlipidemia   . Hypertension   . UTI (urinary tract infection)    Past Surgical History:  Procedure Laterality Date  . BREAST LUMPECTOMY WITH RADIOACTIVE SEED LOCALIZATION Left 08/21/2019   Procedure: LEFT BREAST LUMPECTOMY WITH RADIOACTIVE SEED LOCALIZATION;  Surgeon: Jovita Kussmaul, MD;  Location: [facility];  Service: General;  Laterality: Left;  . CESAREAN SECTION     x2     Current Meds  Medication Sig  . cephALEXin (KEFLEX) 250 MG capsule Take 1 capsule (250 mg total) by mouth 2 (two) times daily for 7 days.  . Insulin Detemir (LEVEMIR  FLEXTOUCH) 100 UNIT/ML Pen Inject 17 Units into the skin at bedtime.  . Insulin Pen Needle (PEN NEEDLES 3/16") 31G X 5 MM MISC Use as directed, once daily.  . Olmesartan-amLODIPine-HCTZ (TRIBENZOR) 40-5-25 MG TABS Take 1 tablet by mouth daily.  . tamoxifen (NOLVADEX) 20 MG tablet Take 1 tablet (20 mg total) by mouth daily.  . Vitamin D, Cholecalciferol, 50 MCG (2000 UT) CAPS Take 2,000 Units by mouth daily.      Allergies:   Patient has no known allergies.   Social  History   Tobacco Use  . Smoking status: Never Smoker  . Smokeless tobacco: Never Used  Substance Use Topics  . Alcohol use: No  . Drug use: No     Family Hx: The patient's family history includes Aneurysm in her mother; Healthy in her father.  ROS:   Please see the history of present illness.    Review of Systems  Constitutional: Negative.   Respiratory: Negative.   Cardiovascular: Negative.   Gastrointestinal: Negative.   Neurological: Negative.   Psychiatric/Behavioral: Negative.     All other systems reviewed and are negative.   Labs/Other Tests and Data Reviewed:    Recent Labs: 05/07/2019: TSH 1.150 12/16/2019: ALT 23 01/10/2020: Hemoglobin 12.6; Platelets 342 01/11/2020: BUN 46; Creatinine, Ser 1.97; Potassium 3.7; Sodium 135   Recent Lipid Panel Lab Results  Component Value Date/Time   CHOL 126 12/16/2019 10:01 AM   TRIG 136 12/16/2019 10:01 AM   HDL 54 12/16/2019 10:01 AM   CHOLHDL 2.3 12/16/2019 10:01 AM   CHOLHDL 5.5 09/17/2009 02:00 AM   LDLCALC 49 12/16/2019 10:01 AM    Wt Readings from Last 3 Encounters:  01/10/20 160 lb (72.6 kg)  12/26/19 164 lb 9.6 oz (74.7 kg)  12/16/19 167 lb 12.8 oz (76.1 kg)     Exam:    Vital Signs:  Ht 5' 5"  (1.651 m)   BMI 26.63 kg/m     Physical Exam  Constitutional: She is oriented to person, place, and time and well-developed, well-nourished, and in no distress.  HENT:  Head: Normocephalic and atraumatic.  Pulmonary/Chest: Effort normal.  Musculoskeletal:     Cervical back: Normal range of motion.  Neurological: She is alert and oriented to person, place, and time.  Psychiatric: Affect normal.  Nursing note and vitals reviewed.   ASSESSMENT & PLAN:     1. Type 2 diabetes mellitus with stage 3 chronic kidney disease, without long-term current use of insulin, unspecified whether stage 3a or 3b CKD (HCC)  TCM PERFORMED. A MEMBER OF THE CLINICAL TEAM SPOKE WITH THE PATIENT UPON DISCHARGE. DISCHARGE SUMMARY  WAS REVIEWED IN FULL DETAIL DURING THE VISIT. MEDS RECONCILED AND COMPARED TO DISCHARGE MEDS. MEDICATION LIST WAS UPDATED AND REVIEWED WITH THE PATIENT. GREATER THAN 50% FACE TO FACE TIME WAS SPENT IN COUNSELING AND COORDINATION OF CARE. ALL QUESTIONS WERE ANSWERED TO THE SATISFACTION OF THE PATIENT. Importance of dietary and medication compliance was discussed with the patient. She agrees to both CCM referral and Nutrition referral. Due to persistently elevated blood sugars, I will increase her Levemir to 20 units nightly. She is encouraged to check BS before meals and to email me a log every Monday. I will make needed insulin changes at that time.   - BMP8+EGFR; Future - Referral to Chronic Care Management Services - Referral to Nutrition and Diabetes Services  2. Acute lower UTI  I will check repeat CBC.   - CBC with Diff; Future  3. Dehydration  She is encouraged to increase her water intake. She agrees to [facility] nurse referral for disease education.   - Referral to Chronic Care Management Services  4. Abnormal CT scan  CT scan results reviewed in full detail during the visit. Ao atherosclerosis noted, along with lung nodule. She agrees to repeat CT scan in 3 months.   5. Aortic Atherosclerosis ([place])  Need for statin compliance was discussed with the patient.     COVID-19 Education: The signs and symptoms of COVID-19 were discussed with the patient and how to seek care for testing (follow up with PCP or arrange E-visit).  The importance of social distancing was discussed today.  Patient Risk:   After full review of this patients clinical status, I feel that they are at least moderate risk at this time.   Medication Adjustments/Labs and Tests Ordered: Current medicines are reviewed at length with the patient today.  Concerns regarding medicines are outlined above.   Tests Ordered: Orders Placed This Encounter  Procedures  . CBC with Diff  . BMP8+EGFR  . Referral to Chronic  Care Management Services  . Referral to Nutrition and Diabetes Services    Medication Changes: Meds ordered this encounter  Medications  . atorvastatin (LIPITOR) 40 MG tablet    Sig: Take 1 tablet by mouth Monday Wed Friday    Dispense:  30 tablet    Refill:  1    Disposition:  Follow up in 6 week(s)  I personally spent 30 minutes face-to-face and non-face-to-face in the care of this patient, which includes all pre-, intra-, and post visit time on the date of service.   Signed, [name], MD

## 2020-01-16 NOTE — Patient Instructions (Signed)

## 2020-01-20 ENCOUNTER — Other Ambulatory Visit: Payer: Self-pay

## 2020-01-20 ENCOUNTER — Other Ambulatory Visit: Payer: Medicare PPO

## 2020-01-20 ENCOUNTER — Ambulatory Visit: Payer: Self-pay

## 2020-01-20 DIAGNOSIS — E1122 Type 2 diabetes mellitus with diabetic chronic kidney disease: Secondary | ICD-10-CM | POA: Diagnosis not present

## 2020-01-20 DIAGNOSIS — N183 Chronic kidney disease, stage 3 unspecified: Secondary | ICD-10-CM

## 2020-01-20 DIAGNOSIS — N390 Urinary tract infection, site not specified: Secondary | ICD-10-CM | POA: Diagnosis not present

## 2020-01-20 DIAGNOSIS — I10 Essential (primary) hypertension: Secondary | ICD-10-CM

## 2020-01-20 NOTE — Chronic Care Management (AMB) (Signed)
  Care Management Note   Michele Wang is a 69 y.o. year old female who is a primary care patient of Michele Chard, MD . The CM team was consulted for assistance with care coordination.   Review of patient status, including review of consultants reports, rand collaboration with appropriate care team members and the patient's provider was performed as part of comprehensive patient evaluation and provision of care management services. Telephone outreach to patient today to introduce CM services.   SDOH (Social Determinants of Health) assessments performed: Yes. No acute challenges identified at this time.    Outpatient Encounter Medications as of 01/20/2020  Medication Sig  . atorvastatin (LIPITOR) 40 MG tablet Take 1 tablet by mouth Monday Wed Friday  . Insulin Detemir (LEVEMIR FLEXTOUCH) 100 UNIT/ML Pen Inject 17 Units into the skin at bedtime.  . Insulin Pen Needle (PEN NEEDLES 3/16") 31G X 5 MM MISC Use as directed, once daily.  Marland Kitchen nystatin (MYCOSTATIN) 100000 UNIT/ML suspension Use as directed 5 mLs (500,000 Units total) in the mouth or throat 4 (four) times daily. (Patient not taking: Reported on 01/16/2020)  . Olmesartan-amLODIPine-HCTZ (TRIBENZOR) 40-5-25 MG TABS Take 1 tablet by mouth daily.  . tamoxifen (NOLVADEX) 20 MG tablet Take 1 tablet (20 mg total) by mouth daily.  . Vitamin D, Cholecalciferol, 50 MCG (2000 UT) CAPS Take 2,000 Units by mouth daily.    No facility-administered encounter medications on file as of 01/20/2020.    I reached out to Michele Wang by phone today.   Michele Wang was given information about Chronic Care Management services today including:  1. CCM service includes personalized support from designated clinical staff supervised by her physician, including individualized plan of care and coordination with other care providers 2. 24/7 contact phone numbers for assistance for urgent and routine care needs. 3. Service will only be billed when office clinical staff  spend 20 minutes or more in a month to coordinate care. 4. Only one practitioner may furnish and bill the service in a calendar month. 5. The patient may stop CCM services at any time (effective at the end of the month) by phone call to the office staff. 6. The patient will be responsible for cost sharing (co-pay) of up to 20% of the service fee (after annual deductible is met).   Patient agreed to services and verbal consent obtained. No SW needs identified during today's call.   Follow Up Plan: RN Case Manager will contact the patient over the next 4 weeks to complete clinical assessment.   Daneen Schick, BSW, CDP Social Worker, Certified Dementia Practitioner [facility] [phone]

## 2020-01-21 ENCOUNTER — Ambulatory Visit
Admission: RE | Admit: 2020-01-21 | Discharge: 2020-01-21 | Disposition: A | Discharge status: Home / Self Care | Source: Ambulatory Visit | Attending: Internal Medicine | Admitting: Internal Medicine

## 2020-01-21 ENCOUNTER — Telehealth: Payer: Self-pay

## 2020-01-21 DIAGNOSIS — R918 Other nonspecific abnormal finding of lung field: Secondary | ICD-10-CM | POA: Diagnosis not present

## 2020-01-21 DIAGNOSIS — R911 Solitary pulmonary nodule: Secondary | ICD-10-CM

## 2020-01-21 LAB — CBC WITH DIFFERENTIAL/PLATELET
Basophils Absolute: 0.1 10*3/uL (ref 0.0–0.2)
Basos: 2 %
EOS (ABSOLUTE): 0.2 10*3/uL (ref 0.0–0.4)
Eos: 6 %
Hematocrit: 33.7 % — ABNORMAL LOW (ref 34.0–46.6)
Hemoglobin: 11.2 g/dL (ref 11.1–15.9)
Immature Grans (Abs): 0 10*3/uL (ref 0.0–0.1)
Immature Granulocytes: 0 %
Lymphocytes Absolute: 2.2 10*3/uL (ref 0.7–3.1)
Lymphs: 52 %
MCH: 29.6 pg (ref 26.6–33.0)
MCHC: 33.2 g/dL (ref 31.5–35.7)
MCV: 89 fL (ref 79–97)
Monocytes Absolute: 0.4 10*3/uL (ref 0.1–0.9)
Monocytes: 9 %
Neutrophils Absolute: 1.3 10*3/uL — ABNORMAL LOW (ref 1.4–7.0)
Neutrophils: 31 %
Platelets: 205 10*3/uL (ref 150–450)
RBC: 3.78 x10E6/uL (ref 3.77–5.28)
RDW: 13.1 % (ref 11.7–15.4)
WBC: 4.2 10*3/uL (ref 3.4–10.8)

## 2020-01-21 LAB — BMP8+EGFR
BUN/Creatinine Ratio: 10 — ABNORMAL LOW (ref 12–28)
BUN: 14 mg/dL (ref 8–27)
CO2: 25 mmol/L (ref 20–29)
Calcium: 9.8 mg/dL (ref 8.7–10.3)
Chloride: 100 mmol/L (ref 96–106)
Creatinine, Ser: 1.4 mg/dL — ABNORMAL HIGH (ref 0.57–1.00)
GFR calc Af Amer: 45 mL/min/{1.73_m2} — ABNORMAL LOW (ref >59)
GFR calc non Af Amer: 39 mL/min/{1.73_m2} — ABNORMAL LOW (ref >59)
Glucose: 140 mg/dL — ABNORMAL HIGH (ref 65–99)
Potassium: 3.9 mmol/L (ref 3.5–5.2)
Sodium: 138 mmol/L (ref 134–144)

## 2020-01-21 NOTE — Telephone Encounter (Signed)
Called pt to give provider message, pt already spoke w/KW  [name], MD  Michelle Nasuti, CMA  Cc: Hassell Done, [place]!   YOUR KIDNEY FUNCTION HAS IMPROVED. Be sure to stay well hydrated. Your blood count is stable. Are you still having any urinary issues? Did you take all of the antibiotics? I will recheck your urine at your next visit. How are you feeling?   What are your morning blood sugar readings?   RS

## 2020-01-23 DIAGNOSIS — E785 Hyperlipidemia, unspecified: Secondary | ICD-10-CM | POA: Diagnosis not present

## 2020-01-23 DIAGNOSIS — E1122 Type 2 diabetes mellitus with diabetic chronic kidney disease: Secondary | ICD-10-CM | POA: Diagnosis not present

## 2020-01-23 DIAGNOSIS — I10 Essential (primary) hypertension: Secondary | ICD-10-CM | POA: Diagnosis not present

## 2020-01-23 DIAGNOSIS — N189 Chronic kidney disease, unspecified: Secondary | ICD-10-CM | POA: Diagnosis not present

## 2020-01-23 DIAGNOSIS — Z6826 Body mass index (BMI) 26.0-26.9, adult: Secondary | ICD-10-CM | POA: Diagnosis not present

## 2020-01-23 DIAGNOSIS — Z853 Personal history of malignant neoplasm of breast: Secondary | ICD-10-CM | POA: Diagnosis not present

## 2020-01-24 ENCOUNTER — Ambulatory Visit: Attending: Internal Medicine

## 2020-01-24 DIAGNOSIS — Z23 Encounter for immunization: Secondary | ICD-10-CM

## 2020-01-24 NOTE — Progress Notes (Signed)
   Covid-19 Vaccination Clinic  Name:  Michele Wang    MRN: [id number] DOB: 1951/07/14  01/24/2020  [name] was observed post Covid-19 immunization for 15 minutes without incidence. She was provided with Vaccine Information Sheet and instruction to access the V-Safe system.   [name] was instructed to call 911 with any severe reactions post vaccine: Marland Kitchen Difficulty breathing  . Swelling of your face and throat  . A fast heartbeat  . A bad rash all over your body  . Dizziness and weakness    Immunizations Administered    Name Date Dose VIS Date Route   Pfizer COVID-19 Vaccine 01/24/2020  3:57 PM 0.3 mL 11/08/2019 Intramuscular   Manufacturer: [place]   Lot: [id number]   [place]: [id number]

## 2020-01-27 ENCOUNTER — Ambulatory Visit

## 2020-01-29 ENCOUNTER — Other Ambulatory Visit: Payer: Self-pay | Admitting: Internal Medicine

## 2020-02-04 DIAGNOSIS — M8588 Other specified disorders of bone density and structure, other site: Secondary | ICD-10-CM | POA: Diagnosis not present

## 2020-02-04 LAB — HM DEXA SCAN

## 2020-02-06 ENCOUNTER — Encounter: Payer: Self-pay | Admitting: Internal Medicine

## 2020-02-10 NOTE — Progress Notes (Signed)
SURVIVORSHIP VISIT:   BRIEF ONCOLOGIC HISTORY:  Oncology History  Ductal carcinoma in situ (DCIS) of left breast  07/10/2019 Cancer Staging   Staging form: Breast, AJCC 8th Edition - Clinical stage from 07/10/2019: Stage 0 (cTis (DCIS), cN0, cM0, ER+, PR+)    07/17/2019 Initial Diagnosis   Routine screening mammogram detected a 0.6cm area of calcifications in the upper outer left breast. Biopsy showed DCIS with calcifications, intermediate grade, ER+100%, PR+ 100% positive.   08/21/2019 Surgery   Left lumpectomy Marlou Starks) [id number]): low grade DCIS spanning 0.4cm, clear margins, and no invasive carcinoma   08/21/2019 Cancer Staging   Staging form: Breast, AJCC 8th Edition - Pathologic stage from 08/21/2019: Stage 0 (pTis (DCIS), pN0, cM0)    09/16/2019 - 10/11/2019 Radiation Therapy   The patient initially received a dose of 42.56 Gy in 16 fractions to the breast using whole-breast tangent fields. This was delivered using a 3-D conformal technique. The patient then received a boost to the seroma. This delivered an additional 8 Gy in 30fractions using a 3 field photon technique due to the depth of the seroma. The total dose was 50.56 Gy.   09/2019 - 09/2024 Anti-estrogen oral therapy   Tamoxifen     INTERVAL HISTORY:  Ms. Josten to review her survivorship care plan detailing her treatment course for breast cancer, as well as monitoring long-term side effects of that treatment, education regarding health maintenance, screening, and overall wellness and health promotion.     Overall, Ms. Wiedman reports feeling quite well.  She is taking Tamoxifen daily.  She notes hot flashes, and these can interfere with her sleep.  She is able to go back to sleep after waking her up.  She completed our survivorship survey (scanned into [facility]), that noted her thinking seems slow at times.  She is exercise, eating a diet rich in fruits and vegetables, and is up to date for the most part with her overall health  maintenance.    REVIEW OF SYSTEMS:  Review of Systems  Constitutional: Negative for appetite change, chills, fatigue, fever and unexpected weight change.  HENT:   Negative for hearing loss and lump/mass.   Eyes: Negative for eye problems and icterus.  Respiratory: Negative for chest tightness, cough and shortness of breath.   Cardiovascular: Negative for chest pain, leg swelling and palpitations.  Gastrointestinal: Negative for abdominal distention, abdominal pain, constipation, diarrhea, nausea and vomiting.  Endocrine: Positive for hot flashes.  Musculoskeletal: Negative for arthralgias.  Skin: Negative for itching and rash.  Neurological: Negative for dizziness, extremity weakness, headaches and numbness.  Hematological: Negative for adenopathy. Does not bruise/bleed easily.  Psychiatric/Behavioral: Positive for decreased concentration and sleep disturbance. Negative for depression. The patient is not nervous/anxious.    Breast: Denies any new nodularity, masses, tenderness, nipple changes, or nipple discharge.      ONCOLOGY TREATMENT TEAM:  1. Surgeon:  Dr. Marlou Starks at [facility] 2. Medical Oncologist: [name]  3. Radiation Oncologist: Dr. Lisbeth Renshaw    PAST MEDICAL/SURGICAL HISTORY:  Past Medical History:  Diagnosis Date  . Breast cancer ([place])   . CKD (chronic kidney disease)   . Diabetes mellitus without complication ([place])    Type II  . Hyperlipidemia   . Hypertension   . UTI (urinary tract infection)    Past Surgical History:  Procedure Laterality Date  . BREAST LUMPECTOMY WITH RADIOACTIVE SEED LOCALIZATION Left 08/21/2019   Procedure: LEFT BREAST LUMPECTOMY WITH RADIOACTIVE SEED LOCALIZATION;  Surgeon: Jovita Kussmaul, MD;  Location: [facility];  Service: General;  Laterality: Left;  . CESAREAN SECTION     x2     ALLERGIES:  No Known Allergies   CURRENT MEDICATIONS:  Outpatient Encounter Medications as of 02/11/2020  Medication Sig  .  atorvastatin (LIPITOR) 40 MG tablet Take 1 tablet by mouth Monday Wed Friday  . Insulin Detemir (LEVEMIR FLEXTOUCH) 100 UNIT/ML Pen Inject 17 Units into the skin at bedtime.  . Insulin Pen Needle (PEN NEEDLES 3/16") 31G X 5 MM MISC Use as directed, once daily.  Marland Kitchen nystatin (MYCOSTATIN) 100000 UNIT/ML suspension Use as directed 5 mLs (500,000 Units total) in the mouth or throat 4 (four) times daily. (Patient not taking: Reported on 01/16/2020)  . Olmesartan-amLODIPine-HCTZ (TRIBENZOR) 40-5-25 MG TABS Take 1 tablet by mouth daily.  . tamoxifen (NOLVADEX) 20 MG tablet Take 1 tablet (20 mg total) by mouth daily.  . Vitamin D, Cholecalciferol, 50 MCG (2000 UT) CAPS Take 2,000 Units by mouth daily.    No facility-administered encounter medications on file as of 02/11/2020.     ONCOLOGIC FAMILY HISTORY:  Family History  Problem Relation Age of Onset  . Aneurysm Mother   . Healthy Father      GENETIC COUNSELING/TESTING: Not at this time  SOCIAL HISTORY:  Social History   Socioeconomic History  . Marital status: Married    Spouse name: Not on file  . Number of children: Not on file  . Years of education: Not on file  . Highest education level: Not on file  Occupational History  . Not on file  Tobacco Use  . Smoking status: Never Smoker  . Smokeless tobacco: Never Used  Substance and Sexual Activity  . Alcohol use: No  . Drug use: No  . Sexual activity: Not on file  Other Topics Concern  . Not on file  Social History Narrative  . Not on file   Social Determinants of Health   Financial Resource Strain:   . Difficulty of Paying Living Expenses:   Food Insecurity: No Food Insecurity  . Worried About Charity fundraiser in the Last Year: Never true  . Ran Out of Food in the Last Year: Never true  Transportation Needs: No Transportation Needs  . Lack of Transportation (Medical): No  . Lack of Transportation (Non-Medical): No  Physical Activity:   . Days of Exercise per Week:    . Minutes of Exercise per Session:   Stress:   . Feeling of Stress :   Social Connections:   . Frequency of Communication with Friends and Family:   . Frequency of Social Gatherings with Friends and Family:   . Attends Religious Services:   . Active Member of Clubs or Organizations:   . Attends Archivist Meetings:   Marland Kitchen Marital Status:   Intimate Partner Violence:   . Fear of Current or Ex-Partner:   . Emotionally Abused:   Marland Kitchen Physically Abused:   . Sexually Abused:      OBSERVATIONS/OBJECTIVE:  BP 117/81 (BP Location: Right Arm, Patient Position: Sitting)   Pulse 61   Temp 98 F (36.7 C) (Temporal)   Resp 18   Ht 5\' 5"  (1.651 m)   Wt 161 lb 3.2 oz (73.1 kg)   SpO2 100%   BMI 26.83 kg/m  GENERAL: Patient is a well appearing female in no acute distress HEENT:  Sclerae anicteric.  Oropharynx clear and moist. No ulcerations or evidence of oropharyngeal candidiasis. Neck is supple.  NODES:  No cervical, supraclavicular, or axillary lymphadenopathy palpated.  BREAST EXAM:  Right breast benign, left breast s/p lumpectomy, no sign of local recurrence LUNGS:  Clear to auscultation bilaterally.  No wheezes or rhonchi. HEART:  Regular rate and rhythm. No murmur appreciated. ABDOMEN:  Soft, nontender.  Positive, normoactive bowel sounds. No organomegaly palpated. MSK:  No focal spinal tenderness to palpation. Full range of motion bilaterally in the upper extremities. EXTREMITIES:  No peripheral edema.   SKIN:  Clear with no obvious rashes or skin changes. No nail dyscrasia. NEURO:  Nonfocal. Well oriented.  Appropriate affect.    LABORATORY DATA:  None for this visit.  DIAGNOSTIC IMAGING:  None for this visit.      ASSESSMENT AND PLAN:  Ms.. Grasse is a pleasant 69 y.o. female with Stage 0 left breast DCIS, ER+/PR+, diagnosed in 06/2019, treated with lumpectomy, adjuvant radiation therapy, and anti-estrogen therapy with Tamoxifen beginning in 09/2019.  She presents to  the Survivorship Clinic for our initial meeting and routine follow-up post-completion of treatment for breast cancer.    1. Stage 0 left breast cancer:  Ms. Griffing is continuing to recover from definitive treatment for breast cancer. She will follow-up with her medical oncologist, [name] in 06/2019 with history and physical exam per surveillance protocol.  She will continue her anti-estrogen therapy with Tamoxifen. Thus far, she is tolerating the Tamoxifen moderately well.  She is experiencing hot flashes and I suggested she change the time of day she takes the treatment.  Since her hot flashes are mainly at night, she could take Gabapentin if the time change doesn't improve the hot flashes.  Her mammogram is due 05/2020; orders placed today. Today, a comprehensive survivorship care plan and treatment summary was reviewed with the patient today detailing her breast cancer diagnosis, treatment course, potential late/long-term effects of treatment, appropriate follow-up care with recommendations for the future, and patient education resources.  A copy of this summary, along with a letter will be sent to the patient's primary care provider via mail/fax/In Basket message after today's visit.    2. Cognitive dysfunction: referral placed to Dr. Mickeal Skinner as she answered "yes" to her thinking seeming slow.  3. Bone health:  Her PCP is following this.  She underwent testing last week and is unaware of the results.  She was given education on specific activities to promote bone health.  4. Cancer screening:  Due to Ms. Whan's history and her age, she should receive screening for skin cancers, colon cancer, (s/p TAH).  The information and recommendations are listed on the patient's comprehensive care plan/treatment summary and were reviewed in detail with the patient.    5. Health maintenance and wellness promotion: Ms. Armetta was encouraged to consume 5-7 servings of fruits and vegetables per day. We reviewed the  "Nutrition Rainbow" handout, as well as the handout "Take Control of Your Health and Reduce Your Cancer Risk" from the [place].  She was also encouraged to engage in moderate to vigorous exercise for 30 minutes per day most days of the week. We discussed the [employer] fitness program, which is designed for cancer survivors to help them become more physically fit after cancer treatments.  She was instructed to limit her alcohol consumption and continue to abstain from tobacco use.     6. Support services/counseling: It is not uncommon for this period of the patient's cancer care trajectory to be one of many emotions and stressors.  We discussed how this can be  increasingly difficult during the times of quarantine and social distancing due to the COVID-19 pandemic.   She was given information regarding our available services and encouraged to contact me with any questions or for help enrolling in any of our support group/programs.    Follow up instructions:    -Return to cancer center 06/2020 -Mammogram due in 05/2020 -Follow up with surgery 12/2020 -She is welcome to return back to the Survivorship Clinic at any time; no additional follow-up needed at this time.  -Consider referral back to survivorship as a long-term survivor for continued surveillance  The patient was provided an opportunity to ask questions and all were answered. The patient agreed with the plan and demonstrated an understanding of the instructions.   Total encounter time: 45 minutes*  Wilber Bihari, NP 02/10/20 9:08 AM Medical Oncology and Hematology [facility] [place], [place] [address] Tel. [phone]    Fax. [phone]  *Total Encounter Time as defined by the Centers for Medicare and Medicaid Services includes, in addition to the face-to-face time of a patient visit (documented in the note above) non-face-to-face time: obtaining and reviewing outside history, ordering  and reviewing medications, tests or procedures, care coordination (communications with other health care professionals or caregivers) and documentation in the medical record.

## 2020-02-11 ENCOUNTER — Ambulatory Visit: Payer: Self-pay

## 2020-02-11 ENCOUNTER — Telehealth: Payer: Self-pay

## 2020-02-11 ENCOUNTER — Ambulatory Visit: Payer: Medicare Other

## 2020-02-11 ENCOUNTER — Encounter: Payer: Medicare PPO | Admitting: Adult Health

## 2020-02-11 ENCOUNTER — Inpatient Hospital Stay: Payer: Medicare PPO | Attending: Adult Health | Admitting: Adult Health

## 2020-02-11 ENCOUNTER — Ambulatory Visit: Payer: Medicare Other | Admitting: Internal Medicine

## 2020-02-11 ENCOUNTER — Other Ambulatory Visit: Payer: Self-pay

## 2020-02-11 ENCOUNTER — Encounter: Payer: Self-pay | Admitting: Adult Health

## 2020-02-11 VITALS — BP 117/81 | HR 61 | Temp 98.0°F | Resp 18 | Ht 65.0 in | Wt 161.2 lb

## 2020-02-11 DIAGNOSIS — N183 Chronic kidney disease, stage 3 unspecified: Secondary | ICD-10-CM

## 2020-02-11 DIAGNOSIS — N951 Menopausal and female climacteric states: Secondary | ICD-10-CM | POA: Insufficient documentation

## 2020-02-11 DIAGNOSIS — N189 Chronic kidney disease, unspecified: Secondary | ICD-10-CM | POA: Insufficient documentation

## 2020-02-11 DIAGNOSIS — I10 Essential (primary) hypertension: Secondary | ICD-10-CM | POA: Diagnosis not present

## 2020-02-11 DIAGNOSIS — E785 Hyperlipidemia, unspecified: Secondary | ICD-10-CM | POA: Diagnosis not present

## 2020-02-11 DIAGNOSIS — N1832 Chronic kidney disease, stage 3b: Secondary | ICD-10-CM

## 2020-02-11 DIAGNOSIS — E1122 Type 2 diabetes mellitus with diabetic chronic kidney disease: Secondary | ICD-10-CM

## 2020-02-11 DIAGNOSIS — Z79810 Long term (current) use of selective estrogen receptor modulators (SERMs): Secondary | ICD-10-CM | POA: Insufficient documentation

## 2020-02-11 DIAGNOSIS — D0512 Intraductal carcinoma in situ of left breast: Secondary | ICD-10-CM

## 2020-02-11 DIAGNOSIS — E119 Type 2 diabetes mellitus without complications: Secondary | ICD-10-CM | POA: Diagnosis not present

## 2020-02-11 DIAGNOSIS — Z170 Estrogen receptor positive status [ER+]: Secondary | ICD-10-CM | POA: Insufficient documentation

## 2020-02-11 DIAGNOSIS — R4189 Other symptoms and signs involving cognitive functions and awareness: Secondary | ICD-10-CM | POA: Diagnosis not present

## 2020-02-11 DIAGNOSIS — Z79899 Other long term (current) drug therapy: Secondary | ICD-10-CM | POA: Insufficient documentation

## 2020-02-11 DIAGNOSIS — Z794 Long term (current) use of insulin: Secondary | ICD-10-CM | POA: Diagnosis not present

## 2020-02-11 DIAGNOSIS — Z923 Personal history of irradiation: Secondary | ICD-10-CM | POA: Diagnosis not present

## 2020-02-11 DIAGNOSIS — R232 Flushing: Secondary | ICD-10-CM | POA: Insufficient documentation

## 2020-02-12 NOTE — Chronic Care Management (AMB) (Signed)
  Chronic Care Management   Outreach Note  02/12/2020 Name: Michele Wang MRN: [id number] DOB: [birth year]  Referred by: [name], MD Reason for referral : Chronic Care Management (RQ Initial Call-DM/CKD)   An unsuccessful telephone outreach was attempted today. The patient was referred to the case management team for assistance with care management and care coordination.   Follow Up Plan: A HIPPA compliant phone message was left for the patient providing contact information and requesting a return call.  Telephone follow up appointment with care management team member scheduled for: 03/02/20  Barb Merino, RN, BSN, CCM Care Management Coordinator [facility]  Direct Phone: [phone]

## 2020-02-13 ENCOUNTER — Ambulatory Visit (INDEPENDENT_AMBULATORY_CARE_PROVIDER_SITE_OTHER): Payer: Medicare PPO | Admitting: Internal Medicine

## 2020-02-13 ENCOUNTER — Ambulatory Visit (INDEPENDENT_AMBULATORY_CARE_PROVIDER_SITE_OTHER): Payer: Medicare PPO

## 2020-02-13 ENCOUNTER — Encounter: Payer: Self-pay | Admitting: Internal Medicine

## 2020-02-13 ENCOUNTER — Other Ambulatory Visit: Payer: Self-pay

## 2020-02-13 VITALS — BP 128/70 | HR 68 | Temp 97.8°F | Ht 65.0 in | Wt 159.9 lb

## 2020-02-13 VITALS — BP 128/70 | HR 68 | Temp 97.8°F | Ht 65.0 in | Wt 159.4 lb

## 2020-02-13 DIAGNOSIS — N1832 Chronic kidney disease, stage 3b: Secondary | ICD-10-CM | POA: Diagnosis not present

## 2020-02-13 DIAGNOSIS — E1122 Type 2 diabetes mellitus with diabetic chronic kidney disease: Secondary | ICD-10-CM | POA: Diagnosis not present

## 2020-02-13 DIAGNOSIS — E559 Vitamin D deficiency, unspecified: Secondary | ICD-10-CM

## 2020-02-13 DIAGNOSIS — M8580 Other specified disorders of bone density and structure, unspecified site: Secondary | ICD-10-CM | POA: Diagnosis not present

## 2020-02-13 DIAGNOSIS — D0512 Intraductal carcinoma in situ of left breast: Secondary | ICD-10-CM

## 2020-02-13 DIAGNOSIS — Z0000 Encounter for general adult medical examination without abnormal findings: Secondary | ICD-10-CM | POA: Diagnosis not present

## 2020-02-13 DIAGNOSIS — I129 Hypertensive chronic kidney disease with stage 1 through stage 4 chronic kidney disease, or unspecified chronic kidney disease: Secondary | ICD-10-CM

## 2020-02-13 DIAGNOSIS — N183 Chronic kidney disease, stage 3 unspecified: Secondary | ICD-10-CM | POA: Diagnosis not present

## 2020-02-13 DIAGNOSIS — R8290 Unspecified abnormal findings in urine: Secondary | ICD-10-CM | POA: Diagnosis not present

## 2020-02-13 DIAGNOSIS — E1121 Type 2 diabetes mellitus with diabetic nephropathy: Secondary | ICD-10-CM | POA: Diagnosis not present

## 2020-02-13 LAB — POCT UA - MICROALBUMIN
Albumin/Creatinine Ratio, Urine, POC: <30
Creatinine, POC: 300 mg/dL
Microalbumin Ur, POC: 30 mg/L

## 2020-02-13 MED ORDER — "PEN NEEDLES 3/16"" 31G X 5 MM MISC": 2 refills | Status: DC

## 2020-02-13 MED ORDER — OLMESARTAN-AMLODIPINE-HCTZ 40-5-25 MG PO TABS: 1.0000 | ORAL_TABLET | Freq: Every day | ORAL | 2 refills | Status: DC

## 2020-02-13 MED ORDER — LEVEMIR FLEXTOUCH 100 UNIT/ML ~~LOC~~ SOPN: PEN_INJECTOR | SUBCUTANEOUS | 2 refills | Status: DC

## 2020-02-13 MED ORDER — ATORVASTATIN CALCIUM 40 MG PO TABS: ORAL_TABLET | ORAL | 2 refills | Status: DC

## 2020-02-13 NOTE — Progress Notes (Signed)
This visit occurred during the SARS-CoV-2 public health emergency.  Safety protocols were in place, including screening questions prior to the visit, additional usage of staff PPE, and extensive cleaning of exam room while observing appropriate contact time as indicated for disinfecting solutions.  Subjective:     Patient ID: Michele Wang , female    DOB: [birth year] , 69 y.o.   MRN: [id number]   Chief Complaint  Patient presents with  . Diabetes  . Hypertension    HPI  She is here today for DM check.  She was started on insulin after a recent hospitalization to improve control of her BS. She has been taking insulin without any issues.   Diabetes She presents for her follow-up diabetic visit. She has type 2 diabetes mellitus. There are no hypoglycemic associated symptoms. There are no diabetic associated symptoms. Pertinent negatives for diabetes include no blurred vision and no chest pain. There are no hypoglycemic complications. Diabetic complications include nephropathy. She is following a diabetic diet. She participates in exercise intermittently.  Hypertension This is a chronic problem. The current episode started more than 1 year ago. The problem has been gradually improving since onset. The problem is controlled. Pertinent negatives include no blurred vision, chest pain or shortness of breath. Past treatments include angiotensin blockers, diuretics and calcium channel blockers. The current treatment provides moderate improvement.     Past Medical History:  Diagnosis Date  . Breast cancer ([place])   . CKD (chronic kidney disease)   . Diabetes mellitus without complication ([place])    Type II  . Hyperlipidemia   . Hypertension   . UTI (urinary tract infection)      Family History  Problem Relation Age of Onset  . Aneurysm Mother   . Healthy Father      Current Outpatient Medications:  .  atorvastatin (LIPITOR) 40 MG tablet, Take 1 tablet by mouth Monday Wed Friday, Disp: 30  tablet, Rfl: 1 .  Insulin Detemir (LEVEMIR FLEXTOUCH) 100 UNIT/ML Pen, Inject 17 Units into the skin at bedtime., Disp: 15 mL, Rfl: 0 .  Insulin Pen Needle (PEN NEEDLES 3/16") 31G X 5 MM MISC, Use as directed, once daily., Disp: 50 each, Rfl: 0 .  Olmesartan-amLODIPine-HCTZ (TRIBENZOR) 40-5-25 MG TABS, Take 1 tablet by mouth daily., Disp: 90 tablet, Rfl: 2 .  tamoxifen (NOLVADEX) 20 MG tablet, Take 1 tablet (20 mg total) by mouth daily., Disp: 90 tablet, Rfl: 3 .  Vitamin D, Cholecalciferol, 50 MCG (2000 UT) CAPS, Take 2,000 Units by mouth daily. , Disp: , Rfl:    No Known Allergies   Review of Systems  Constitutional: Negative.   Eyes: Negative for blurred vision.  Respiratory: Negative.  Negative for shortness of breath.   Cardiovascular: Negative.  Negative for chest pain.  Gastrointestinal: Negative.   Neurological: Negative.   Psychiatric/Behavioral: Negative.      Today's Vitals   02/13/20 0913  BP: 128/70  Pulse: 68  Temp: 97.8 F (36.6 C)  TempSrc: Oral  SpO2: 95%  Weight: 159 lb 14.4 oz (72.5 kg)  Height: 5\' 5"  (1.651 m)   Body mass index is 26.61 kg/m.   Objective:  Physical Exam Vitals and nursing note reviewed.  Constitutional:      Appearance: Normal appearance.  HENT:     Head: Normocephalic and atraumatic.  Cardiovascular:     Rate and Rhythm: Normal rate and regular rhythm.     Heart sounds: Normal heart sounds.  Pulmonary:  Effort: Pulmonary effort is normal.     Breath sounds: Normal breath sounds.  Skin:    General: Skin is warm.  Neurological:     General: No focal deficit present.     Mental Status: She is alert.  Psychiatric:        Mood and Affect: Mood normal.        Behavior: Behavior normal.         Assessment And Plan:     1. Type 2 diabetes mellitus with stage 3 chronic kidney disease, without long-term current use of insulin, unspecified whether stage 3a or 3b CKD (HCC)  Chronic. I will check an a1c today. She is encouraged  to keep upcoming appt with Nutrition. She is encouraged to avoid sugary beverages and refined carbs.   - Hemoglobin A1c  2. Hypertensive nephropathy  Chronic, well controlled. She will continue with current meds. She is encouraged to avoid adding salt to her foods.   3. Stage 3b chronic kidney disease  Chronic, yet stable. Importance of optimal BP control and adequate hydration was discussed with the patient.   4. Ductal carcinoma in situ (DCIS) of left breast  She is under the care of Oncology. She is s/p lumpectomy and adjuvant radiation therapy and now on tamoxifen.   5. Vitamin D deficiency disease  I WILL CHECK A VIT D LEVEL AND SUPPLEMENT AS NEEDED.  ALSO ENCOURAGED TO SPEND 15 MINUTES IN THE SUN DAILY.   - Vitamin D (25 hydroxy)  6. Osteopenia, unspecified location  We discussed her most recent dexa scan results. She is encouraged to engage in weightbearing exercises three days per week. She was also given information to read on this topic.   [name], MD    THE PATIENT IS ENCOURAGED TO PRACTICE SOCIAL DISTANCING DUE TO THE COVID-19 PANDEMIC.

## 2020-02-13 NOTE — Patient Instructions (Signed)
Michele Wang , Thank you for taking time to come for your Medicare Wellness Visit. I appreciate your ongoing commitment to your health goals. Please review the following plan we discussed and let me know if I can assist you in the future.   Screening recommendations/referrals: Colonoscopy: 07/2019 Mammogram: 06/2019 Bone Density: 01/2020 Recommended yearly ophthalmology/optometry visit for glaucoma screening and checkup Recommended yearly dental visit for hygiene and checkup  Vaccinations: Influenza vaccine: 10/2019 Pneumococcal vaccine: 01/2019 Tdap vaccine: 09/2014 Shingles vaccine: discussed    Advanced directives: Advance directive discussed with you today. Even though you declined this today please call our office should you change your mind and we can give you the proper paperwork for you to fill out.   Conditions/risks identified: overweight  Next appointment:    Preventive Care 69 Years and Older, Female Preventive care refers to lifestyle choices and visits with your health care provider that can promote health and wellness. What does preventive care include?  A yearly physical exam. This is also called an annual well check.  Dental exams once or twice a year.  Routine eye exams. Ask your health care provider how often you should have your eyes checked.  Personal lifestyle choices, including:  Daily care of your teeth and gums.  Regular physical activity.  Eating a healthy diet.  Avoiding tobacco and drug use.  Limiting alcohol use.  Practicing safe sex.  Taking low-dose aspirin every day.  Taking vitamin and mineral supplements as recommended by your health care provider. What happens during an annual well check? The services and screenings done by your health care provider during your annual well check will depend on your age, overall health, lifestyle risk factors, and family history of disease. Counseling  Your health care provider may ask you questions  about your:  Alcohol use.  Tobacco use.  Drug use.  Emotional well-being.  Home and relationship well-being.  Sexual activity.  Eating habits.  History of falls.  Memory and ability to understand (cognition).  Work and work Statistician.  Reproductive health. Screening  You may have the following tests or measurements:  Height, weight, and BMI.  Blood pressure.  Lipid and cholesterol levels. These may be checked every 5 years, or more frequently if you are over 56 years old.  Skin check.  Lung cancer screening. You may have this screening every year starting at age 78 if you have a 30-pack-year history of smoking and currently smoke or have quit within the past 15 years.  Fecal occult blood test (FOBT) of the stool. You may have this test every year starting at age 84.  Flexible sigmoidoscopy or colonoscopy. You may have a sigmoidoscopy every 5 years or a colonoscopy every 10 years starting at age 68.  Hepatitis C blood test.  Hepatitis B blood test.  Sexually transmitted disease (STD) testing.  Diabetes screening. This is done by checking your blood sugar (glucose) after you have not eaten for a while (fasting). You may have this done every 1-3 years.  Bone density scan. This is done to screen for osteoporosis. You may have this done starting at age 40.  Mammogram. This may be done every 1-2 years. Talk to your health care provider about how often you should have regular mammograms. Talk with your health care provider about your test results, treatment options, and if necessary, the need for more tests. Vaccines  Your health care provider may recommend certain vaccines, such as:  Influenza vaccine. This is recommended every year.  Tetanus, diphtheria, and acellular pertussis (Tdap, Td) vaccine. You may need a Td booster every 10 years.  Zoster vaccine. You may need this after age 31.  Pneumococcal 13-valent conjugate (PCV13) vaccine. One dose is  recommended after age 15.  Pneumococcal polysaccharide (PPSV23) vaccine. One dose is recommended after age 79. Talk to your health care provider about which screenings and vaccines you need and how often you need them. This information is not intended to replace advice given to you by your health care provider. Make sure you discuss any questions you have with your health care provider. Document Released: 12/11/2015 Document Revised: 08/03/2016 Document Reviewed: 09/15/2015 Elsevier Interactive Patient Education  2017 [place] Prevention in the Home Falls can cause injuries. They can happen to people of all ages. There are many things you can do to make your home safe and to help prevent falls. What can I do on the outside of my home?  Regularly fix the edges of walkways and driveways and fix any cracks.  Remove anything that might make you trip as you walk through a door, such as a raised step or threshold.  Trim any bushes or trees on the path to your home.  Use bright outdoor lighting.  Clear any walking paths of anything that might make someone trip, such as rocks or tools.  Regularly check to see if handrails are loose or broken. Make sure that both sides of any steps have handrails.  Any raised decks and porches should have guardrails on the edges.  Have any leaves, snow, or ice cleared regularly.  Use sand or salt on walking paths during winter.  Clean up any spills in your garage right away. This includes oil or grease spills. What can I do in the bathroom?  Use night lights.  Install grab bars by the toilet and in the tub and shower. Do not use towel bars as grab bars.  Use non-skid mats or decals in the tub or shower.  If you need to sit down in the shower, use a plastic, non-slip stool.  Keep the floor dry. Clean up any water that spills on the floor as soon as it happens.  Remove soap buildup in the tub or shower regularly.  Attach bath mats  securely with double-sided non-slip rug tape.  Do not have throw rugs and other things on the floor that can make you trip. What can I do in the bedroom?  Use night lights.  Make sure that you have a light by your bed that is easy to reach.  Do not use any sheets or blankets that are too big for your bed. They should not hang down onto the floor.  Have a firm chair that has side arms. You can use this for support while you get dressed.  Do not have throw rugs and other things on the floor that can make you trip. What can I do in the kitchen?  Clean up any spills right away.  Avoid walking on wet floors.  Keep items that you use a lot in easy-to-reach places.  If you need to reach something above you, use a strong step stool that has a grab bar.  Keep electrical cords out of the way.  Do not use floor polish or wax that makes floors slippery. If you must use wax, use non-skid floor wax.  Do not have throw rugs and other things on the floor that can make you trip. What can I do  with my stairs?  Do not leave any items on the stairs.  Make sure that there are handrails on both sides of the stairs and use them. Fix handrails that are broken or loose. Make sure that handrails are as long as the stairways.  Check any carpeting to make sure that it is firmly attached to the stairs. Fix any carpet that is loose or worn.  Avoid having throw rugs at the top or bottom of the stairs. If you do have throw rugs, attach them to the floor with carpet tape.  Make sure that you have a light switch at the top of the stairs and the bottom of the stairs. If you do not have them, ask someone to add them for you. What else can I do to help prevent falls?  Wear shoes that:  Do not have high heels.  Have rubber bottoms.  Are comfortable and fit you well.  Are closed at the toe. Do not wear sandals.  If you use a stepladder:  Make sure that it is fully opened. Do not climb a closed  stepladder.  Make sure that both sides of the stepladder are locked into place.  Ask someone to hold it for you, if possible.  Clearly mark and make sure that you can see:  Any grab bars or handrails.  First and last steps.  Where the edge of each step is.  Use tools that help you move around (mobility aids) if they are needed. These include:  Canes.  Walkers.  Scooters.  Crutches.  Turn on the lights when you go into a dark area. Replace any light bulbs as soon as they burn out.  Set up your furniture so you have a clear path. Avoid moving your furniture around.  If any of your floors are uneven, fix them.  If there are any pets around you, be aware of where they are.  Review your medicines with your doctor. Some medicines can make you feel dizzy. This can increase your chance of falling. Ask your doctor what other things that you can do to help prevent falls. This information is not intended to replace advice given to you by your health care provider. Make sure you discuss any questions you have with your health care provider. Document Released: 09/10/2009 Document Revised: 04/21/2016 Document Reviewed: 12/19/2014 Elsevier Interactive Patient Education  2017 [employer].

## 2020-02-13 NOTE — Progress Notes (Signed)
This visit occurred during the SARS-CoV-2 public health emergency.  Safety protocols were in place, including screening questions prior to the visit, additional usage of staff PPE, and extensive cleaning of exam room while observing appropriate contact time as indicated for disinfecting solutions.  Subjective:   Michele Wang is a 69 y.o. female who presents for an Initial Medicare Annual Wellness Visit.  Review of Systems    n/a  Cardiac Risk Factors include: advanced age (>35men, >15 women);diabetes mellitus;hypertension;obesity (BMI >30kg/m2)     Objective:    Today's Vitals   02/13/20 0900  BP: 128/70  Pulse: 68  Temp: 97.8 F (36.6 C)  TempSrc: Oral  SpO2: 95%  Weight: 159 lb 6.4 oz (72.3 kg)  Height: 5\' 5"  (1.651 m)   Body mass index is 26.53 kg/m.  Advanced Directives 02/13/2020 02/11/2020 09/05/2019 08/21/2019 08/15/2019 07/17/2019 02/05/2019  Does Patient Have a Medical Advance Directive? No No No No No No No  Type of Advance Directive - - - - - - -  Would patient like information on creating a medical advance directive? No - Patient declined No - Patient declined - No - Patient declined - No - Patient declined No - Patient declined    Current Medications (verified) Outpatient Encounter Medications as of 02/13/2020  Medication Sig  . atorvastatin (LIPITOR) 40 MG tablet Take 1 tablet by mouth Monday Wed Friday  . Insulin Detemir (LEVEMIR FLEXTOUCH) 100 UNIT/ML Pen Inject 17 Units into the skin at bedtime.  . Insulin Pen Needle (PEN NEEDLES 3/16") 31G X 5 MM MISC Use as directed, once daily.  . Olmesartan-amLODIPine-HCTZ (TRIBENZOR) 40-5-25 MG TABS Take 1 tablet by mouth daily.  . tamoxifen (NOLVADEX) 20 MG tablet Take 1 tablet (20 mg total) by mouth daily.  . Vitamin D, Cholecalciferol, 50 MCG (2000 UT) CAPS Take 2,000 Units by mouth daily.    No facility-administered encounter medications on file as of 02/13/2020.    Allergies (verified) Patient has no known allergies.    History: Past Medical History:  Diagnosis Date  . Breast cancer ([place])   . CKD (chronic kidney disease)   . Diabetes mellitus without complication ([place])    Type II  . Hyperlipidemia   . Hypertension   . UTI (urinary tract infection)    Past Surgical History:  Procedure Laterality Date  . BREAST LUMPECTOMY WITH RADIOACTIVE SEED LOCALIZATION Left 08/21/2019   Procedure: LEFT BREAST LUMPECTOMY WITH RADIOACTIVE SEED LOCALIZATION;  Surgeon: Jovita Kussmaul, MD;  Location: [facility];  Service: General;  Laterality: Left;  . CESAREAN SECTION     x2   Family History  Problem Relation Age of Onset  . Aneurysm Mother   . Healthy Father    Social History   Socioeconomic History  . Marital status: Married    Spouse name: Not on file  . Number of children: Not on file  . Years of education: Not on file  . Highest education level: Not on file  Occupational History  . Occupation: retired  Tobacco Use  . Smoking status: Never Smoker  . Smokeless tobacco: Never Used  Substance and Sexual Activity  . Alcohol use: No  . Drug use: No  . Sexual activity: Yes  Other Topics Concern  . Not on file  Social History Narrative  . Not on file   Social Determinants of Health   Financial Resource Strain: Low Risk   . Difficulty of Paying Living Expenses: Not hard at all  Food Insecurity:  No Food Insecurity  . Worried About Charity fundraiser in the Last Year: Never true  . Ran Out of Food in the Last Year: Never true  Transportation Needs: No Transportation Needs  . Lack of Transportation (Medical): No  . Lack of Transportation (Non-Medical): No  Physical Activity: Inactive  . Days of Exercise per Week: 0 days  . Minutes of Exercise per Session: 0 min  Stress: No Stress Concern Present  . Feeling of Stress : Not at all  Social Connections:   . Frequency of Communication with Friends and Family:   . Frequency of Social Gatherings with Friends and Family:   . Attends  Religious Services:   . Active Member of Clubs or Organizations:   . Attends Archivist Meetings:   Marland Kitchen Marital Status:     Tobacco Counseling Counseling given: Not Answered   Clinical Intake:  Pre-visit preparation completed: Yes  Pain : No/denies pain     Nutritional Status: BMI 25 -29 Overweight Nutritional Risks: None Diabetes: Yes  How often do you need to have someone help you when you read instructions, pamphlets, or other written materials from your doctor or pharmacy?: 1 - Never What is the last grade level you completed in school?: 12th grade  Interpreter Needed?: No  Information entered by :: NAllen LPN   Activities of Daily Living In your present state of health, do you have any difficulty performing the following activities: 02/13/2020 08/21/2019  Hearing? N N  Vision? Y N  Comment some trouble seeing far away -  Difficulty concentrating or making decisions? N N  Walking or climbing stairs? N N  Dressing or bathing? N N  Doing errands, shopping? N -  Preparing Food and eating ? N -  Using the Toilet? N -  In the past six months, have you accidently leaked urine? Y -  Do you have problems with loss of bowel control? N -  Managing your Medications? N -  Managing your Finances? N -  Housekeeping or managing your Housekeeping? N -  Some recent data might be hidden     Immunizations and Health Maintenance Immunization History  Administered Date(s) Administered  . DTaP 08/03/2007, 10/14/2014  . Influenza, High Dose Seasonal PF 11/06/2019  . PFIZER SARS-COV-2 Vaccination 01/24/2020  . Pneumococcal Conjugate-13 03/03/2014  . Pneumococcal Polysaccharide-23 01/30/2016, 02/05/2019   Health Maintenance Due  Topic Date Due  . FOOT EXAM  02/05/2020    Patient Care Team: [name], MD as PCP - General (Internal Medicine) Daneen Schick as Social Worker Kyung Rudd, MD as Consulting Physician (Radiation Oncology) Jovita Kussmaul, MD as  Consulting Physician (General Surgery) Nicholas Lose, MD as Consulting Physician (Hematology and Oncology)  Indicate any recent Medical Services you may have received from other than [facility] providers in the past year (date may be approximate).     Assessment:   This is a routine wellness examination for [place].  Hearing/Vision screen  Hearing Screening   125Hz  250Hz  500Hz  1000Hz  2000Hz  3000Hz  4000Hz  6000Hz  8000Hz   Right ear:           Left ear:           Vision Screening Comments: Regular eye exams. Dr. Katy Fitch  Dietary issues and exercise activities discussed: Current Exercise Habits: The patient does not participate in regular exercise at present  Goals    . DIET - DECREASE SODA OR JUICE INTAKE    . DIET - REDUCE FAST FOOD INTAKE    .  Patient Stated     02/13/2020, to get a routine for exercise and be more healthy      Depression Screen PHQ 2/9 Scores 02/13/2020 08/08/2019 05/07/2019 02/05/2019 11/08/2018  PHQ - 2 Score 0 0 0 0 0  PHQ- 9 Score 0 - - - -    Fall Risk Fall Risk  02/13/2020 12/16/2019 08/08/2019 07/02/2019 05/07/2019  Falls in the past year? 0 0 0 0 0  Risk for fall due to : Medication side effect - - - -  Follow up Falls evaluation completed;Education provided;Falls prevention discussed - - - -    Is the patient's home free of loose throw rugs in walkways, pet beds, electrical cords, etc?   yes      Grab bars in the bathroom? no      Handrails on the stairs?   n/a      Adequate lighting?   yes  Timed Get Up and Go Performed n/a  Cognitive Function:     6CIT Screen 02/13/2020 02/05/2019  What Year? 0 points 0 points  What month? 0 points 0 points  What time? 0 points 0 points  Count back from 20 0 points 0 points  Months in reverse 0 points 0 points  Repeat phrase 2 points 0 points  Total Score 2 0    Screening Tests Health Maintenance  Topic Date Due  . FOOT EXAM  02/05/2020  . HEMOGLOBIN A1C  07/10/2020  . OPHTHALMOLOGY EXAM  11/18/2020  . MAMMOGRAM   06/30/2021  . TETANUS/TDAP  10/14/2024  . COLONOSCOPY  08/08/2029  . INFLUENZA VACCINE  Completed  . DEXA SCAN  Completed  . Hepatitis C Screening  Completed  . PNA vac Low Risk Adult  Completed    Qualifies for Shingles Vaccine? yes  Cancer Screenings: Lung: Low Dose CT Chest recommended if Age 50-80 years, 30 pack-year currently smoking OR have quit w/in 15years. Patient does not qualify. Breast: Up to date on Mammogram? Yes   Up to date of Bone Density/Dexa? Yes Colorectal: up to date  Additional Screenings:  Hepatitis C Screening: 01/01/2013     Plan:    Patient wants to get a routine for exercise and be more healthy.  I have personally reviewed and noted the following in the patient's chart:   . Medical and social history . Use of alcohol, tobacco or illicit drugs  . Current medications and supplements . Functional ability and status . Nutritional status . Physical activity . Advanced directives . List of other physicians . Hospitalizations, surgeries, and ER visits in previous 12 months . Vitals . Screenings to include cognitive, depression, and falls . Referrals and appointments  In addition, I have reviewed and discussed with patient certain preventive protocols, quality metrics, and best practice recommendations. A written personalized care plan for preventive services as well as general preventive health recommendations were provided to patient.     Kellie Simmering, LPN   [id number]

## 2020-02-13 NOTE — Patient Instructions (Signed)
Osteopenia  Osteopenia is a loss of thickness (density) inside of the bones. Another name for osteopenia is low bone mass. Mild osteopenia is a normal part of aging. It is not a disease, and it does not cause symptoms. However, if you have osteopenia and continue to lose bone mass, you could develop a condition that causes the bones to become thin and break more easily (osteoporosis). You may also lose some height, have back pain, and have a stooped posture. Although osteopenia is not a disease, making changes to your lifestyle and diet can help to prevent osteopenia from developing into osteoporosis. What are the causes? Osteopenia is caused by loss of calcium in the bones.  Bones are constantly changing. Old bone cells are continually being replaced with new bone cells. This process builds new bone. The mineral calcium is needed to build new bone and maintain bone density. Bone density is usually highest around age 35. After that, most people's bodies cannot replace all the bone they have lost with new bone. What increases the risk? You are more likely to develop this condition if:  You are older than age 50.  You are a woman who went through menopause early.  You have a long illness that keeps you in bed.  You do not get enough exercise.  You lack certain nutrients (malnutrition).  You have an overactive thyroid gland (hyperthyroidism).  You smoke.  You drink a lot of alcohol.  You are taking medicines that weaken the bones, such as steroids. What are the signs or symptoms? This condition does not cause any symptoms. You may have a slightly higher risk for bone breaks (fractures), so getting fractures more easily than normal may be an indication of osteopenia. How is this diagnosed? Your health care provider can diagnose this condition with a special type of X-ray exam that measures bone density (dual-energy X-ray absorptiometry, DEXA). This test can measure bone density in your  hips, spine, and wrists. Osteopenia has no symptoms, so this condition is usually diagnosed after a routine bone density screening test is done for osteoporosis. This routine screening is usually done for:  Women who are age 65 or older.  Men who are age 70 or older. If you have risk factors for osteopenia, you may have the screening test at an earlier age. How is this treated? Making dietary and lifestyle changes can lower your risk for osteoporosis. If you have severe osteopenia that is close to becoming osteoporosis, your health care provider may prescribe medicines and dietary supplements such as calcium and vitamin D. These supplements help to rebuild bone density. Follow these instructions at home:   Take over-the-counter and prescription medicines only as told by your health care provider. These include vitamins and supplements.  Eat a diet that is high in calcium and vitamin D. ? Calcium is found in dairy products, beans, salmon, and leafy green vegetables like spinach and broccoli. ? Look for foods that have vitamin D and calcium added to them (fortified foods), such as orange juice, cereal, and bread.  Do 30 or more minutes of a weight-bearing exercise every day, such as walking, jogging, or playing a sport. These types of exercises strengthen the bones.  Take precautions at home to lower your risk of falling, such as: ? Keeping rooms well-lit and free of clutter, such as cords. ? Installing safety rails on stairs. ? Using rubber mats in the bathroom or other areas that are often wet or slippery.  Do not use   any products that contain nicotine or tobacco, such as cigarettes and e-cigarettes. If you need help quitting, ask your health care provider.  Avoid alcohol or limit alcohol intake to no more than 1 drink a day for nonpregnant women and 2 drinks a day for men. One drink equals 12 oz of beer, 5 oz of wine, or 1 oz of hard liquor.  Keep all follow-up visits as told by your  health care provider. This is important. Contact a health care provider if:  You have not had a bone density screening for osteoporosis and you are: ? A woman, age 65 or older. ? A man, age 70 or older.  You are a postmenopausal woman who has not had a bone density screening for osteoporosis.  You are older than age 50 and you want to know if you should have bone density screening for osteoporosis. Summary  Osteopenia is a loss of thickness (density) inside of the bones. Another name for osteopenia is low bone mass.  Osteopenia is not a disease, but it may increase your risk for a condition that causes the bones to become thin and break more easily (osteoporosis).  You may be at risk for osteopenia if you are older than age 50 or if you are a woman who went through early menopause.  Osteopenia does not cause any symptoms, but it can be diagnosed with a bone density screening test.  Dietary and lifestyle changes are the first treatment for osteopenia. These may lower your risk for osteoporosis. This information is not intended to replace advice given to you by your health care provider. Make sure you discuss any questions you have with your health care provider. Document Revised: 10/27/2017 Document Reviewed: 08/23/2017 Elsevier Patient Education  2020 [employer]  

## 2020-02-14 ENCOUNTER — Encounter: Payer: Self-pay | Admitting: Dietician

## 2020-02-14 ENCOUNTER — Encounter: Payer: Medicare PPO | Attending: Internal Medicine | Admitting: Dietician

## 2020-02-14 DIAGNOSIS — E1122 Type 2 diabetes mellitus with diabetic chronic kidney disease: Secondary | ICD-10-CM

## 2020-02-14 DIAGNOSIS — E1121 Type 2 diabetes mellitus with diabetic nephropathy: Secondary | ICD-10-CM | POA: Insufficient documentation

## 2020-02-14 DIAGNOSIS — N1832 Chronic kidney disease, stage 3b: Secondary | ICD-10-CM | POA: Insufficient documentation

## 2020-02-14 LAB — HEMOGLOBIN A1C
Est. average glucose Bld gHb Est-mCnc: 240 mg/dL
Hgb A1c MFr Bld: 10 % — ABNORMAL HIGH (ref 4.8–5.6)

## 2020-02-14 LAB — VITAMIN D 25 HYDROXY (VIT D DEFICIENCY, FRACTURES): Vit D, 25-Hydroxy: 59.2 ng/mL (ref 30.0–100.0)

## 2020-02-14 NOTE — Progress Notes (Signed)
Diabetes Self-Management Education  Visit Type: First/Initial  Appt. Start Time: 1030 Appt. End Time: 1200  02/14/2020  [name], identified by name and date of birth, is a 69 y.o. female with a diagnosis of Diabetes: Type 2.   ASSESSMENT Patient is here today alone.  She would like to learn more about how to eat with diabetes.    History includes type 2 diabetes for the past 12-15 years and reports that it was well controlled until recently. Other history includes:  HTN, HLD, CKD stage 3,  vitamin D deficiency, ductal breast cancer 9/20 and finished radiation in 11/20.  Medications include:  Levemir 20 units q HS  Weight 159 lbs at MD appointment 02/13/20 and lost 10 lbs since her cancer dx in September 2020.  She attriutes her weight loss to a number of factors (cancer, stress, radiation, uncontrolled diabetes).  Patient lives with her husband and son.  They all share shopping and cooking but patient states ths does most.  She is retired from [employer].   Height 5\' 4"  (1.626 m), weight 159 lb (72.1 kg). Body mass index is 27.29 kg/m.  Diabetes Self-Management Education - 02/14/20 1044      Visit Information   Visit Type  First/Initial      Initial Visit   Diabetes Type  Type 2    Are you currently following a meal plan?  No    Are you taking your medications as prescribed?  Yes    Date Diagnosed  about 2008      Health Coping   How would you rate your overall health?  Fair      Psychosocial Assessment   Patient Belief/Attitude about Diabetes  Motivated to manage diabetes    Self-care barriers  None    Self-management support  Doctor's office    Other persons present  Patient    Patient Concerns  Nutrition/Meal planning;Glycemic Control;Healthy Lifestyle    Special Needs  None    Preferred Learning Style  No preference indicated    Learning Readiness  Ready    How often do you need to have someone help you when you read instructions, pamphlets, or other written  materials from your doctor or pharmacy?  1 - Never    What is the last grade level you completed in school?  12th grade      Pre-Education Assessment   Patient understands the diabetes disease and treatment process.  Needs Instruction    Patient understands incorporating nutritional management into lifestyle.  Needs Instruction    Patient undertands incorporating physical activity into lifestyle.  Needs Instruction    Patient understands using medications safely.  Needs Instruction    Patient understands monitoring blood glucose, interpreting and using results  Needs Instruction    Patient understands prevention, detection, and treatment of acute complications.  Needs Instruction    Patient understands prevention, detection, and treatment of chronic complications.  Needs Instruction    Patient understands how to develop strategies to address psychosocial issues.  Needs Instruction    Patient understands how to develop strategies to promote health/change behavior.  Needs Instruction      Complications   Last HgB A1C per patient/outside source  10 %   02/13/20 increased from 11.5% 01/11/20   How often do you check your blood sugar?  3-4 times/day    Fasting Blood glucose range (mg/dL)  70-129   97-140   Number of hypoglycemic episodes per month  0    Number  of hyperglycemic episodes per week  0    Have you had a dilated eye exam in the past 12 months?  Yes    Have you had a dental exam in the past 12 months?  Yes    Are you checking your feet?  Yes    How many days per week are you checking your feet?  7      Dietary Intake   Breakfast  scrambled egg, [employer] toast (dry), spinach OR instant oatmeal, milk    Lunch  salad, berries, low fat dressing    Snack (afternoon)  occasional popcorn OR pecan crackers, occasional PB    Dinner  salmon, vegetable, beans or occasional rice OR 1/2 sub OR baked [place] wings, vegetable, sweet potato with butter    Snack (evening)  occasional small dessert OR  apple OR yogurt    Beverage(s)  water, water with lemon and occasional splenda or stevia, hot tea with small amount of honey or splenda      Exercise   Exercise Type  Light (walking / raking leaves)    How many days per week to you exercise?  2    How many minutes per day do you exercise?  60    Total minutes per week of exercise  120      Patient Education   Previous Diabetes Education  No    Disease state   Definition of diabetes, type 1 and 2, and the diagnosis of diabetes;Explored patient's options for treatment of their diabetes    Nutrition management   Role of diet in the treatment of diabetes and the relationship between the three main macronutrients and blood glucose level;Food label reading, portion sizes and measuring food.;Meal options for control of blood glucose level and chronic complications.    Physical activity and exercise   Role of exercise on diabetes management, blood pressure control and cardiac health.;Helped patient identify appropriate exercises in relation to his/her diabetes, diabetes complications and other health issue.    Medications  Taught/reviewed insulin injection, site rotation, insulin storage and needle disposal.;Reviewed patients medication for diabetes, action, purpose, timing of dose and side effects.    Monitoring  Taught/discussed recording of test results and interpretation of SMBG.;Identified appropriate SMBG and/or A1C goals.;Yearly dilated eye exam    Acute complications  Taught treatment of hypoglycemia - the 15 rule.    Chronic complications  Relationship between chronic complications and blood glucose control    Psychosocial adjustment  Role of stress on diabetes;Identified and addressed patients feelings and concerns about diabetes;Worked with patient to identify barriers to care and solutions      Individualized Goals (developed by patient)   Nutrition  General guidelines for healthy choices and portions discussed    Physical Activity   Exercise 3-5 times per week;30 minutes per day    Medications  take my medication as prescribed    Monitoring   test my blood glucose as discussed    Reducing Risk  increase portions of healthy fats;Other (comment)   increased plants     Post-Education Assessment   Patient understands the diabetes disease and treatment process.  Demonstrates understanding / competency    Patient understands incorporating nutritional management into lifestyle.  Needs Review    Patient undertands incorporating physical activity into lifestyle.  Demonstrates understanding / competency    Patient understands using medications safely.  Demonstrates understanding / competency    Patient understands monitoring blood glucose, interpreting and using results  Demonstrates understanding /  competency    Patient understands prevention, detection, and treatment of acute complications.  Demonstrates understanding / competency    Patient understands prevention, detection, and treatment of chronic complications.  Demonstrates understanding / competency    Patient understands how to develop strategies to address psychosocial issues.  Demonstrates understanding / competency    Patient understands how to develop strategies to promote health/change behavior.  Needs Review      Outcomes   Expected Outcomes  Demonstrated interest in learning. Expect positive outcomes    Future DMSE  3-4 months    Program Status  Not Completed       Individualized Plan for Diabetes Self-Management Training:   Learning Objective:  Patient will have a greater understanding of diabetes self-management. Patient education plan is to attend individual and/or group sessions per assessed needs and concerns.   Plan:   Patient Instructions  Be sure to rotate your insulin injection sites.  Never inject in a hard spot, bruise or other discoloration. Consider asking your MD for a prescription for the FreeStyle Libre to your pharmacy to see how this is  covered by your insurance.  Aim to be active most days.  Ask your friend about the Zoom exercise class  Look for a video on you tube such as walk away the pounds, sit and be fit or zumba   Walk  Great job on changing to water!! Continue to be mindful with your meals Choose baked rather than fried most often Continue to find ways to add vegetables to your meals. Small portions of lean meat.  Fish is a great choice.  Consider options for meatless meals such as salad, soup, vegetable plates.       Expected Outcomes:  Demonstrated interest in learning. Expect positive outcomes  Education material provided: ADA - How to Thrive: A Guide for Your Journey with Diabetes and Meal plan card  If problems or questions, patient to contact team via:  Phone and Email  Future DSME appointment: 3-4 months

## 2020-02-14 NOTE — Patient Instructions (Addendum)
Be sure to rotate your insulin injection sites.  Never inject in a hard spot, bruise or other discoloration. Consider asking your MD for a prescription for the FreeStyle Libre to your pharmacy to see how this is covered by your insurance.  Aim to be active most days.  Ask your friend about the Zoom exercise class  Look for a video on you tube such as walk away the pounds, sit and be fit or zumba   Walk  Great job on changing to water!! Continue to be mindful with your meals Choose baked rather than fried most often Continue to find ways to add vegetables to your meals. Small portions of lean meat.  Fish is a great choice.  Consider options for meatless meals such as salad, soup, vegetable plates.

## 2020-02-15 LAB — URINE CULTURE

## 2020-02-16 ENCOUNTER — Encounter: Payer: Self-pay | Admitting: Internal Medicine

## 2020-02-16 ENCOUNTER — Other Ambulatory Visit: Payer: Self-pay | Admitting: Internal Medicine

## 2020-02-16 MED ORDER — CEPHALEXIN 500 MG PO CAPS: 500.0000 mg | ORAL_CAPSULE | Freq: Two times a day (BID) | ORAL | 0 refills | Status: AC

## 2020-02-17 ENCOUNTER — Telehealth: Payer: Self-pay | Admitting: Internal Medicine

## 2020-02-17 ENCOUNTER — Other Ambulatory Visit: Payer: Self-pay

## 2020-02-17 MED ORDER — FREESTYLE LIBRE 14 DAY SENSOR MISC: 2 refills | Status: DC

## 2020-02-17 MED ORDER — FREESTYLE LIBRE 14 DAY READER DEVI: 1.0000 | Freq: Four times a day (QID) | 2 refills | Status: DC

## 2020-02-17 NOTE — Telephone Encounter (Signed)
Pt cld to reschedule her new pt appt w/Dr Mickeal Skinner to 9am on 3/29. She wanted an earlier appt dare and time.

## 2020-02-18 ENCOUNTER — Encounter: Payer: Self-pay | Admitting: Internal Medicine

## 2020-02-18 DIAGNOSIS — H2513 Age-related nuclear cataract, bilateral: Secondary | ICD-10-CM | POA: Diagnosis not present

## 2020-02-18 DIAGNOSIS — H40033 Anatomical narrow angle, bilateral: Secondary | ICD-10-CM | POA: Diagnosis not present

## 2020-02-18 DIAGNOSIS — E1137X3 Type 2 diabetes mellitus with diabetic macular edema, resolved following treatment, bilateral: Secondary | ICD-10-CM | POA: Diagnosis not present

## 2020-02-18 DIAGNOSIS — E113293 Type 2 diabetes mellitus with mild nonproliferative diabetic retinopathy without macular edema, bilateral: Secondary | ICD-10-CM | POA: Diagnosis not present

## 2020-02-18 LAB — HM DIABETES EYE EXAM

## 2020-02-19 ENCOUNTER — Ambulatory Visit: Attending: Internal Medicine

## 2020-02-19 DIAGNOSIS — Z23 Encounter for immunization: Secondary | ICD-10-CM

## 2020-02-19 NOTE — Progress Notes (Signed)
   Covid-19 Vaccination Clinic  Name:  Michele Wang    MRN: [id number] DOB: 07/01/1951  02/19/2020  Michele Wang was observed post Covid-19 immunization for 15 minutes without incident. She was provided with Vaccine Information Sheet and instruction to access the V-Safe system.   Michele Wang was instructed to call 911 with any severe reactions post vaccine: Marland Kitchen Difficulty breathing  . Swelling of face and throat  . A fast heartbeat  . A bad rash all over body  . Dizziness and weakness   Immunizations Administered    Name Date Dose VIS Date Route   Pfizer COVID-19 Vaccine 02/19/2020 10:25 AM 0.3 mL 11/08/2019 Intramuscular   Manufacturer: [employer]   Lot: [id number]   [place]: [id number]

## 2020-02-24 ENCOUNTER — Other Ambulatory Visit: Payer: Self-pay

## 2020-02-24 ENCOUNTER — Inpatient Hospital Stay (HOSPITAL_BASED_OUTPATIENT_CLINIC_OR_DEPARTMENT_OTHER): Payer: Medicare PPO | Admitting: Internal Medicine

## 2020-02-24 DIAGNOSIS — R4189 Other symptoms and signs involving cognitive functions and awareness: Secondary | ICD-10-CM | POA: Diagnosis not present

## 2020-02-24 DIAGNOSIS — R413 Other amnesia: Secondary | ICD-10-CM | POA: Diagnosis not present

## 2020-02-24 DIAGNOSIS — Z79810 Long term (current) use of selective estrogen receptor modulators (SERMs): Secondary | ICD-10-CM | POA: Diagnosis not present

## 2020-02-24 DIAGNOSIS — Z170 Estrogen receptor positive status [ER+]: Secondary | ICD-10-CM | POA: Diagnosis not present

## 2020-02-24 DIAGNOSIS — N951 Menopausal and female climacteric states: Secondary | ICD-10-CM | POA: Diagnosis not present

## 2020-02-24 DIAGNOSIS — R232 Flushing: Secondary | ICD-10-CM | POA: Diagnosis not present

## 2020-02-24 DIAGNOSIS — Z923 Personal history of irradiation: Secondary | ICD-10-CM | POA: Diagnosis not present

## 2020-02-24 DIAGNOSIS — E785 Hyperlipidemia, unspecified: Secondary | ICD-10-CM | POA: Diagnosis not present

## 2020-02-24 DIAGNOSIS — I10 Essential (primary) hypertension: Secondary | ICD-10-CM | POA: Diagnosis not present

## 2020-02-24 DIAGNOSIS — I639 Cerebral infarction, unspecified: Secondary | ICD-10-CM | POA: Diagnosis not present

## 2020-02-24 DIAGNOSIS — D0512 Intraductal carcinoma in situ of left breast: Secondary | ICD-10-CM | POA: Diagnosis not present

## 2020-02-24 NOTE — Progress Notes (Signed)
[facility] [place], [place] [address] [phone]   New Patient Evaluation  Date of Service: 02/24/20 Patient Name: Michele Wang Patient MRN: [id number] Patient DOB: June 13, 1951 Provider: Ventura Sellers, MD  Identifying Statement:  Michele Wang is a 69 y.o. female with stroke, memory impairment who presents for initial consultation and evaluation regarding cancer associated neurologic deficits.    Referring Provider: [name], [address] [place],  [place] [address]  Primary Cancer:  Oncologic History: Oncology History  Ductal carcinoma in situ (DCIS) of left breast  07/10/2019 Cancer Staging   Staging form: Breast, AJCC 8th Edition - Clinical stage from 07/10/2019: Stage 0 (cTis (DCIS), cN0, cM0, ER+, PR+)    07/17/2019 Initial Diagnosis   Routine screening mammogram detected a 0.6cm area of calcifications in the upper outer left breast. Biopsy showed DCIS with calcifications, intermediate grade, ER+100%, PR+ 100% positive.   08/21/2019 Surgery   Left lumpectomy Marlou Starks) [phone]): low grade DCIS spanning 0.4cm, clear margins, and no invasive carcinoma   08/21/2019 Cancer Staging   Staging form: Breast, AJCC 8th Edition - Pathologic stage from 08/21/2019: Stage 0 (pTis (DCIS), pN0, cM0)    09/16/2019 - 10/11/2019 Radiation Therapy   The patient initially received a dose of 42.56 Gy in 16 fractions to the breast using whole-breast tangent fields. This was delivered using a 3-D conformal technique. The patient then received a boost to the seroma. This delivered an additional 8 Gy in 51fractions using a 3 field photon technique due to the depth of the seroma. The total dose was 50.56 Gy.   09/2019 - 09/2024 Anti-estrogen oral therapy   Tamoxifen     History of Present Illness: The patient's records from the referring physician were obtained and reviewed and the patient interviewed to confirm this  HPI.  Michele Wang present today for cognitive survivorship visit.  She describes some degree of "slowdown" mentally since her breast cancer diagnosis.  Occasionally will forget her keys, people's names, etc.  Overall function is independent and does not rely on others for feeding, driving, bill-paying.  Currently lives with husband.  Does recall having stroke admission in 2010 although details are not retained well.  Takes medication to control HTN and DM, does no smoke, does get aerobic exercise.  Medications: Current Outpatient Medications on File Prior to Visit  Medication Sig Dispense Refill  . atorvastatin (LIPITOR) 40 MG tablet Take 1 tablet by mouth Monday Wed Friday 90 tablet 2  . Continuous Blood Gluc Receiver (FREESTYLE LIBRE 14 DAY READER) DEVI 1 each by Does not apply route QID. 2 each 2  . Continuous Blood Gluc Sensor (FREESTYLE LIBRE 14 DAY SENSOR) MISC Use as directed to check blood sugars 4 times per day dx: e11.65 2 each 2  . insulin detemir (LEVEMIR FLEXTOUCH) 100 UNIT/ML FlexPen Inject 20 units subcutaneously daily at bedtime, max dose 60 units 15 pen 2  . Insulin Pen Needle (PEN NEEDLES 3/16") 31G X 5 MM MISC Use as directed, once daily. 90 each 2  . Olmesartan-amLODIPine-HCTZ (TRIBENZOR) 40-5-25 MG TABS Take 1 tablet by mouth daily. 90 tablet 2  . tamoxifen (NOLVADEX) 20 MG tablet Take 1 tablet (20 mg total) by mouth daily. 90 tablet 3  . Vitamin D, Cholecalciferol, 50 MCG (2000 UT) CAPS Take 2,000 Units by mouth daily.      No current facility-administered medications on file prior to visit.    Allergies:  No Known Allergies Past Medical History:  Past Medical History:  Diagnosis Date  . Breast cancer ([facility])   . CKD (chronic kidney disease)   . Diabetes mellitus without complication ([facility])    Type II  . Hyperlipidemia   . Hypertension   . UTI (urinary tract infection)    Past Surgical History:  Past Surgical History:  Procedure Laterality Date  . BREAST LUMPECTOMY  WITH RADIOACTIVE SEED LOCALIZATION Left 08/21/2019   Procedure: LEFT BREAST LUMPECTOMY WITH RADIOACTIVE SEED LOCALIZATION;  Surgeon: Jovita Kussmaul, MD;  Location: [facility];  Service: General;  Laterality: Left;  . CESAREAN SECTION     x2   Social History:  Social History   Socioeconomic History  . Marital status: Married    Spouse name: Not on file  . Number of children: Not on file  . Years of education: Not on file  . Highest education level: Not on file  Occupational History  . Occupation: retired  Tobacco Use  . Smoking status: Never Smoker  . Smokeless tobacco: Never Used  Substance and Sexual Activity  . Alcohol use: No  . Drug use: No  . Sexual activity: Yes  Other Topics Concern  . Not on file  Social History Narrative  . Not on file   Social Determinants of Health   Financial Resource Strain: Low Risk   . Difficulty of Paying Living Expenses: Not hard at all  Food Insecurity: No Food Insecurity  . Worried About Charity fundraiser in the Last Year: Never true  . Ran Out of Food in the Last Year: Never true  Transportation Needs: No Transportation Needs  . Lack of Transportation (Medical): No  . Lack of Transportation (Non-Medical): No  Physical Activity: Inactive  . Days of Exercise per Week: 0 days  . Minutes of Exercise per Session: 0 min  Stress: No Stress Concern Present  . Feeling of Stress : Not at all  Social Connections:   . Frequency of Communication with Friends and Family:   . Frequency of Social Gatherings with Friends and Family:   . Attends Religious Services:   . Active Member of Clubs or Organizations:   . Attends Archivist Meetings:   Marland Kitchen Marital Status:   Intimate Partner Violence:   . Fear of Current or Ex-Partner:   . Emotionally Abused:   Marland Kitchen Physically Abused:   . Sexually Abused:    Family History:  Family History  Problem Relation Age of Onset  . Aneurysm Mother   . Healthy Father     Review of  Systems: Constitutional: Doesn't report fevers, chills or abnormal weight loss Eyes: Doesn't report blurriness of vision Ears, nose, mouth, throat, and face: Doesn't report sore throat Respiratory: Doesn't report cough, dyspnea or wheezes Cardiovascular: Doesn't report palpitation, chest discomfort  Gastrointestinal:  Doesn't report nausea, constipation, diarrhea GU: Doesn't report incontinence Skin: Doesn't report skin rashes Neurological: Per HPI Musculoskeletal: Doesn't report joint pain Behavioral/Psych: Doesn't report anxiety  Physical Exam: Vitals:   02/24/20 0919  BP: 135/82  Pulse: 67  Resp: 18  Temp: 98.9 F (37.2 C)  SpO2: 100%   KPS: 90. General: Alert, cooperative, pleasant, in no acute distress Head: Normal EENT: No conjunctival injection or scleral icterus.  Lungs: Resp effort normal Cardiac: Regular rate Abdomen: Non-distended abdomen Skin: No rashes cyanosis or petechiae. Extremities: No clubbing or edema  Neurologic Exam: Mental Status: Awake, alert, attentive to examiner. Oriented to self and environment. Language is  fluent with intact comprehension.  Cranial Nerves: Visual acuity is grossly normal. Visual fields are full. Extra-ocular movements intact. No ptosis. Face is symmetric Motor: Tone and bulk are normal. Power is full in both arms and legs. Reflexes are symmetric, no pathologic reflexes present.  Sensory: Intact to light touch Gait: Normal.   Labs: I have reviewed the data as listed    Component Value Date/Time   NA 138 01/20/2020 0855   K 3.9 01/20/2020 0855   CL 100 01/20/2020 0855   CO2 25 01/20/2020 0855   GLUCOSE 140 (H) 01/20/2020 0855   GLUCOSE 271 (H) 01/11/2020 1504   BUN 14 01/20/2020 0855   CREATININE 1.40 (H) 01/20/2020 0855   CALCIUM 9.8 01/20/2020 0855   PROT 7.1 12/16/2019 1001   ALBUMIN 4.1 12/16/2019 1001   AST 23 12/16/2019 1001   ALT 23 12/16/2019 1001   ALKPHOS 95 12/16/2019 1001   BILITOT 0.3 12/16/2019 1001     GFRNONAA 39 (L) 01/20/2020 0855   GFRAA 45 (L) 01/20/2020 0855   Lab Results  Component Value Date   WBC 4.2 01/20/2020   NEUTROABS 1.3 (L) 01/20/2020   HGB 11.2 01/20/2020   HCT 33.7 (L) 01/20/2020   MCV 89 01/20/2020   PLT 205 01/20/2020      Assessment/Plan Memory impairment Stroke  Ms. Bieda does not demonstrate clinical signs of dementia or mild cognitive impairment.  Per records reviewed, she has at least 3 infarcts dating to 2010 including one in the brainstem.  She had not been taking an anti-platelet drug.    We counseled her regarding etiology of cognitive impairments in cancer patients with references to survivorship and goals of care.  For stroke, we strongly advised she begin therapy with daily ASA 81mg  for secondary prevention.  She is considered high risk for stroke based on clinical and medical history.  Blood pressure, diabetes and lipids are well controlled at this time, this regimen can remain as prior.   We also counseled on lifestyle modifications such as diet and exercise, as well as positive mental outlook/attitude.  Fortunately she is a non-smoker.  We spent twenty additional minutes teaching regarding the natural history, biology, and historical experience in the treatment of neurologic complications of cancer.   We appreciate the opportunity to participate in the care of Michele Wang.  She may resume survivorship visits with Dr. Delice Bison and return to neuro-oncology clinic as needed.  All questions were answered. The patient knows to call the clinic with any problems, questions or concerns. No barriers to learning were detected.  The total time spent in the encounter was 40 minutes and more than 50% was on counseling and review of test results   Ventura Sellers, MD Medical Director of Neuro-Oncology [facility] 02/24/20 4:15 PM

## 2020-02-25 ENCOUNTER — Inpatient Hospital Stay: Payer: Medicare PPO | Admitting: Internal Medicine

## 2020-03-02 ENCOUNTER — Ambulatory Visit: Payer: Self-pay

## 2020-03-02 ENCOUNTER — Other Ambulatory Visit: Payer: Self-pay

## 2020-03-02 ENCOUNTER — Telehealth: Payer: Self-pay

## 2020-03-02 DIAGNOSIS — E1122 Type 2 diabetes mellitus with diabetic chronic kidney disease: Secondary | ICD-10-CM

## 2020-03-02 DIAGNOSIS — I129 Hypertensive chronic kidney disease with stage 1 through stage 4 chronic kidney disease, or unspecified chronic kidney disease: Secondary | ICD-10-CM

## 2020-03-02 DIAGNOSIS — N1832 Chronic kidney disease, stage 3b: Secondary | ICD-10-CM

## 2020-03-03 NOTE — Chronic Care Management (AMB) (Signed)
  Chronic Care Management   Outreach Note  03/03/2020 Name: Michele Wang MRN: [id number] DOB: [birth year]  Referred by: [name], MD Reason for referral : Chronic Care Management (RQ #2 Initial Call - DM, HTN, CKD )   A second unsuccessful telephone outreach was attempted today. The patient was referred to the case management team for assistance with care management and care coordination.   Follow Up Plan: A HIPPA compliant phone message was left for the patient providing contact information and requesting a return call.  The care management team will reach out to the patient again over the next 14-21 days.   Barb Merino, RN, BSN, CCM  Care Management Coordinator [facility]  Direct Phone: [phone]

## 2020-03-17 ENCOUNTER — Other Ambulatory Visit: Payer: Self-pay

## 2020-03-17 DIAGNOSIS — E1122 Type 2 diabetes mellitus with diabetic chronic kidney disease: Secondary | ICD-10-CM

## 2020-03-17 DIAGNOSIS — N1832 Chronic kidney disease, stage 3b: Secondary | ICD-10-CM

## 2020-03-17 MED ORDER — FREESTYLE LIBRE 14 DAY READER DEVI: 1.0000 | Freq: Four times a day (QID) | 2 refills | Status: DC

## 2020-03-17 MED ORDER — ATORVASTATIN CALCIUM 40 MG PO TABS: ORAL_TABLET | ORAL | 2 refills | Status: DC

## 2020-03-17 MED ORDER — FREESTYLE LIBRE 14 DAY SENSOR MISC: 2 refills | Status: DC

## 2020-03-23 ENCOUNTER — Ambulatory Visit: Payer: Self-pay

## 2020-03-23 ENCOUNTER — Other Ambulatory Visit: Payer: Self-pay

## 2020-03-23 ENCOUNTER — Telehealth: Payer: Self-pay

## 2020-03-23 DIAGNOSIS — E1122 Type 2 diabetes mellitus with diabetic chronic kidney disease: Secondary | ICD-10-CM

## 2020-03-23 DIAGNOSIS — I10 Essential (primary) hypertension: Secondary | ICD-10-CM

## 2020-03-23 DIAGNOSIS — N1832 Chronic kidney disease, stage 3b: Secondary | ICD-10-CM

## 2020-03-24 NOTE — Chronic Care Management (AMB) (Signed)
  Chronic Care Management   Outreach Note  03/24/2020 Name: Michele Wang MRN: [id number] DOB: Feb 23, 1951  Referred by: [name], MD Reason for referral : Chronic Care Management (RQ #3 Initial RN Call )   Third unsuccessful telephone outreach was attempted today. The patient was referred to the case management team for assistance with care management and care coordination. The patient's primary care provider has been notified of our unsuccessful attempts to make or maintain contact with the patient. The care management team is pleased to engage with this patient at any time in the future should he/she be interested in assistance from the care management team.   Follow Up Plan: A HIPPA compliant phone message was left for the patient providing contact information and requesting a return call.  The care management team is available to follow up with the patient after provider conversation with the patient regarding recommendation for care management engagement and subsequent re-referral to the care management team.   Barb Merino, RN, BSN, CCM Care Management Coordinator [facility]  Direct Phone: [phone]

## 2020-03-30 ENCOUNTER — Other Ambulatory Visit: Payer: Self-pay

## 2020-03-30 DIAGNOSIS — N1832 Chronic kidney disease, stage 3b: Secondary | ICD-10-CM

## 2020-03-30 DIAGNOSIS — E1122 Type 2 diabetes mellitus with diabetic chronic kidney disease: Secondary | ICD-10-CM

## 2020-03-30 MED ORDER — FREESTYLE LIBRE 14 DAY READER DEVI: 1.0000 | Freq: Four times a day (QID) | 2 refills | Status: DC

## 2020-03-30 MED ORDER — FREESTYLE LIBRE 14 DAY SENSOR MISC: 2 refills | Status: DC

## 2020-04-23 ENCOUNTER — Other Ambulatory Visit: Payer: Self-pay

## 2020-04-23 DIAGNOSIS — E1122 Type 2 diabetes mellitus with diabetic chronic kidney disease: Secondary | ICD-10-CM

## 2020-04-23 DIAGNOSIS — N1832 Chronic kidney disease, stage 3b: Secondary | ICD-10-CM

## 2020-04-23 MED ORDER — FREESTYLE LIBRE 14 DAY SENSOR MISC: 2 refills | Status: DC

## 2020-05-14 ENCOUNTER — Ambulatory Visit: Payer: Medicare PPO | Admitting: Dietician

## 2020-05-19 ENCOUNTER — Encounter (INDEPENDENT_AMBULATORY_CARE_PROVIDER_SITE_OTHER): Payer: Self-pay | Admitting: Ophthalmology

## 2020-05-19 ENCOUNTER — Other Ambulatory Visit: Payer: Self-pay

## 2020-05-19 ENCOUNTER — Ambulatory Visit (INDEPENDENT_AMBULATORY_CARE_PROVIDER_SITE_OTHER): Payer: Medicare PPO | Admitting: Ophthalmology

## 2020-05-19 DIAGNOSIS — H2513 Age-related nuclear cataract, bilateral: Secondary | ICD-10-CM | POA: Diagnosis not present

## 2020-05-19 DIAGNOSIS — E113393 Type 2 diabetes mellitus with moderate nonproliferative diabetic retinopathy without macular edema, bilateral: Secondary | ICD-10-CM | POA: Diagnosis not present

## 2020-05-19 DIAGNOSIS — H43812 Vitreous degeneration, left eye: Secondary | ICD-10-CM

## 2020-05-19 DIAGNOSIS — H43822 Vitreomacular adhesion, left eye: Secondary | ICD-10-CM | POA: Insufficient documentation

## 2020-05-19 HISTORY — DX: Vitreomacular adhesion, left eye: H43.822

## 2020-05-19 NOTE — Assessment & Plan Note (Signed)
The nature of moderate nonproliferative diabetic retinopathy was discussed with the patient as well as the need for more frequent follow up to judge for progression. Good blood glucose, blood pressure, and serum lipid control was recommended as well as avoidance of smoking and maintenance of normal weight.  Close follow up with PCP encouraged. OU stable

## 2020-05-19 NOTE — Progress Notes (Signed)
05/19/2020     CHIEF COMPLAINT Patient presents for Retina Follow Up   HISTORY OF PRESENT ILLNESS: Michele Wang is a 69 y.o. female who presents to the clinic today for:   HPI    Retina Follow Up    Patient presents with  Diabetic Retinopathy.  In both eyes.  Duration of 6 months.  Since onset it is stable.          Comments    6 month follow up - OCT OU Patient denies change in vision and overall has no complaints. LBS 163 // A1C 10       Last edited by Gerda Diss on 05/19/2020  8:23 AM. (History)      Referring physician: [name], MD [address] [place],  [place] [address]  HISTORICAL INFORMATION:   Selected notes from the MEDICAL RECORD NUMBER    Lab Results  Component Value Date   HGBA1C 10.0 (H) 02/13/2020     CURRENT MEDICATIONS: No current outpatient medications on file. (Ophthalmic Drugs)   No current facility-administered medications for this visit. (Ophthalmic Drugs)   Current Outpatient Medications (Other)  Medication Sig  . atorvastatin (LIPITOR) 40 MG tablet Take 1 tablet by mouth Monday Wed Friday  . Continuous Blood Gluc Receiver (FREESTYLE LIBRE 14 DAY READER) DEVI 1 each by Does not apply route QID.  Marland Kitchen Continuous Blood Gluc Sensor (FREESTYLE LIBRE 14 DAY SENSOR) MISC Use as directed to check blood sugars 4 times per day dx: e11.65  . insulin detemir (LEVEMIR FLEXTOUCH) 100 UNIT/ML FlexPen Inject 20 units subcutaneously daily at bedtime, max dose 60 units  . Insulin Pen Needle (PEN NEEDLES 3/16") 31G X 5 MM MISC Use as directed, once daily.  . Olmesartan-amLODIPine-HCTZ (TRIBENZOR) 40-5-25 MG TABS Take 1 tablet by mouth daily.  . tamoxifen (NOLVADEX) 20 MG tablet Take 1 tablet (20 mg total) by mouth daily.  . Vitamin D, Cholecalciferol, 50 MCG (2000 UT) CAPS Take 2,000 Units by mouth daily.    No current facility-administered medications for this visit. (Other)      REVIEW OF SYSTEMS:    ALLERGIES No Known  Allergies  PAST MEDICAL HISTORY Past Medical History:  Diagnosis Date  . Breast cancer ([place])   . CKD (chronic kidney disease)   . Diabetes mellitus without complication ([place])    Type II  . Hyperlipidemia   . Hypertension   . UTI (urinary tract infection)    Past Surgical History:  Procedure Laterality Date  . BREAST LUMPECTOMY WITH RADIOACTIVE SEED LOCALIZATION Left 08/21/2019   Procedure: LEFT BREAST LUMPECTOMY WITH RADIOACTIVE SEED LOCALIZATION;  Surgeon: Jovita Kussmaul, MD;  Location: [facility];  Service: General;  Laterality: Left;  . CESAREAN SECTION     x2    FAMILY HISTORY Family History  Problem Relation Age of Onset  . Aneurysm Mother   . Healthy Father     SOCIAL HISTORY Social History   Tobacco Use  . Smoking status: Never Smoker  . Smokeless tobacco: Never Used  Vaping Use  . Vaping Use: Never used  Substance Use Topics  . Alcohol use: No  . Drug use: No         OPHTHALMIC EXAM: Base Eye Exam    Visual Acuity (Snellen - Linear)      Right Left   Dist [place] 20/20 20/25-2       Tonometry (Tonopen, 8:28 AM)      Right Left   Pressure  19 19       Pupils      Pupils Dark Light Shape React APD   Right PERRL 4 3 Round Minimal None   Left PERRL 4 3 Round Minimal None       Visual Fields (Counting fingers)      Left Right    Full Full       Extraocular Movement      Right Left    Full Full       Neuro/Psych    Oriented x3: Yes   Mood/Affect: Normal       Dilation    Both eyes: 1.0% Mydriacyl, 2.5% Phenylephrine @ 8:28 AM        Slit Lamp and Fundus Exam    External Exam      Right Left   External Normal Normal       Slit Lamp Exam      Right Left   Lids/Lashes Normal Normal   Conjunctiva/Sclera White and quiet White and quiet   Cornea Clear Clear   Anterior Chamber Deep and quiet Deep and quiet   Iris Round and reactive Round and reactive   Lens 1+ Nuclear sclerosis 1+ Nuclear sclerosis   Anterior Vitreous  Normal Normal       Fundus Exam      Right Left   Posterior Vitreous Normal Normal   Disc Normal Normal   C/D Ratio 0.45 0.45   Macula Normal,, no exudates, no macular thickening Normal,, no exudates, no macular thickening   Vessels NPDR- Moderate Normal   Periphery Normal Normal          IMAGING AND PROCEDURES  Imaging and Procedures for 05/19/20  OCT, Retina - OU - Both Eyes       Right Eye Quality was good. Scan locations included subfoveal. Central Foveal Thickness: 272. Progression has been stable. Findings include normal observations.   Left Eye Quality was good. Scan locations included subfoveal. Central Foveal Thickness: 251. Progression has been stable.   Notes No active maculopathy OU, posterior vitreous detachment visualized OS                ASSESSMENT/PLAN:  No problem-specific Assessment & Plan notes found for this encounter.      ICD-10-CM   1. Moderate nonproliferative diabetic retinopathy of both eyes without macular edema associated with type 2 diabetes mellitus (HCC)  D14.9702 OCT, Retina - OU - Both Eyes  2. Posterior vitreous detachment of left eye  H43.812 OCT, Retina - OU - Both Eyes    1.  2.  3.  Ophthalmic Meds Ordered this visit:  No orders of the defined types were placed in this encounter.      No follow-ups on file.  There are no Patient Instructions on file for this visit.   Explained the diagnoses, plan, and follow up with the patient and they expressed understanding.  Patient expressed understanding of the importance of proper follow up care.   Clent Demark Tome Wilson M.D. Diseases & Surgery of the Retina and Vitreous [facility]ina [facility] Diabetic [place] 05/19/20     Abbreviations: M myopia (nearsighted); A astigmatism; H hyperopia (farsighted); P presbyopia; Mrx spectacle prescription;  CTL contact lenses; OD right eye; OS left eye; OU both eyes  XT exotropia; ET esotropia; PEK punctate epithelial keratitis; PEE punctate  epithelial erosions; DES dry eye syndrome; MGD meibomian gland dysfunction; ATs artificial tears; PFAT's preservative free artificial tears; [place] nuclear sclerotic cataract; PSC posterior subcapsular cataract; ERM  epi-retinal membrane; PVD posterior vitreous detachment; RD retinal detachment; DM diabetes mellitus; DR diabetic retinopathy; NPDR non-proliferative diabetic retinopathy; PDR proliferative diabetic retinopathy; CSME clinically significant macular edema; DME diabetic macular edema; dbh dot blot hemorrhages; CWS cotton wool spot; POAG primary open angle glaucoma; C/D cup-to-disc ratio; HVF humphrey visual field; GVF goldmann visual field; OCT optical coherence tomography; IOP intraocular pressure; BRVO Branch retinal vein occlusion; CRVO central retinal vein occlusion; CRAO central retinal artery occlusion; BRAO branch retinal artery occlusion; RT retinal tear; SB scleral buckle; PPV pars plana vitrectomy; VH Vitreous hemorrhage; PRP panretinal laser photocoagulation; IVK intravitreal kenalog; VMT vitreomacular traction; MH Macular hole;  NVD neovascularization of the disc; NVE neovascularization elsewhere; AREDS age related eye disease study; ARMD age related macular degeneration; POAG primary open angle glaucoma; EBMD epithelial/anterior basement membrane dystrophy; ACIOL anterior chamber intraocular lens; IOL intraocular lens; PCIOL posterior chamber intraocular lens; Phaco/IOL phacoemulsification with intraocular lens placement; [place] photorefractive keratectomy; LASIK laser assisted in situ keratomileusis; HTN hypertension; DM diabetes mellitus; COPD chronic obstructive pulmonary disease

## 2020-05-19 NOTE — Assessment & Plan Note (Signed)

## 2020-05-19 NOTE — Assessment & Plan Note (Signed)

## 2020-06-08 ENCOUNTER — Other Ambulatory Visit: Payer: Self-pay

## 2020-06-08 DIAGNOSIS — E1122 Type 2 diabetes mellitus with diabetic chronic kidney disease: Secondary | ICD-10-CM

## 2020-06-08 DIAGNOSIS — N1832 Chronic kidney disease, stage 3b: Secondary | ICD-10-CM

## 2020-06-08 MED ORDER — FREESTYLE LIBRE 14 DAY SENSOR MISC: 2 refills | Status: DC

## 2020-06-22 DIAGNOSIS — R922 Inconclusive mammogram: Secondary | ICD-10-CM | POA: Diagnosis not present

## 2020-06-22 LAB — HM MAMMOGRAPHY: HM Mammogram: ABNORMAL — AB (ref 0–4)

## 2020-06-25 ENCOUNTER — Ambulatory Visit: Payer: Medicare PPO | Admitting: Dietician

## 2020-07-03 DIAGNOSIS — N200 Calculus of kidney: Secondary | ICD-10-CM | POA: Diagnosis not present

## 2020-07-03 DIAGNOSIS — D0512 Intraductal carcinoma in situ of left breast: Secondary | ICD-10-CM | POA: Diagnosis not present

## 2020-07-03 DIAGNOSIS — N179 Acute kidney failure, unspecified: Secondary | ICD-10-CM | POA: Diagnosis not present

## 2020-07-03 DIAGNOSIS — E1122 Type 2 diabetes mellitus with diabetic chronic kidney disease: Secondary | ICD-10-CM | POA: Diagnosis not present

## 2020-07-03 DIAGNOSIS — I129 Hypertensive chronic kidney disease with stage 1 through stage 4 chronic kidney disease, or unspecified chronic kidney disease: Secondary | ICD-10-CM | POA: Diagnosis not present

## 2020-07-03 DIAGNOSIS — N183 Chronic kidney disease, stage 3 unspecified: Secondary | ICD-10-CM | POA: Diagnosis not present

## 2020-07-03 DIAGNOSIS — R911 Solitary pulmonary nodule: Secondary | ICD-10-CM | POA: Diagnosis not present

## 2020-07-08 ENCOUNTER — Encounter: Payer: Self-pay | Admitting: Internal Medicine

## 2020-07-13 ENCOUNTER — Encounter: Payer: Self-pay | Admitting: Internal Medicine

## 2020-07-13 ENCOUNTER — Inpatient Hospital Stay: Payer: Medicare PPO | Attending: Hematology and Oncology | Admitting: Hematology and Oncology

## 2020-07-13 ENCOUNTER — Other Ambulatory Visit: Payer: Self-pay

## 2020-07-13 ENCOUNTER — Inpatient Hospital Stay: Payer: Medicare PPO | Admitting: Hematology and Oncology

## 2020-07-13 ENCOUNTER — Ambulatory Visit (INDEPENDENT_AMBULATORY_CARE_PROVIDER_SITE_OTHER): Payer: Medicare PPO | Admitting: Internal Medicine

## 2020-07-13 VITALS — BP 124/80 | HR 67 | Temp 97.9°F | Ht 66.0 in | Wt 165.4 lb

## 2020-07-13 DIAGNOSIS — Z79810 Long term (current) use of selective estrogen receptor modulators (SERMs): Secondary | ICD-10-CM | POA: Diagnosis not present

## 2020-07-13 DIAGNOSIS — Z79899 Other long term (current) drug therapy: Secondary | ICD-10-CM | POA: Diagnosis not present

## 2020-07-13 DIAGNOSIS — Z0000 Encounter for general adult medical examination without abnormal findings: Secondary | ICD-10-CM | POA: Diagnosis not present

## 2020-07-13 DIAGNOSIS — E663 Overweight: Secondary | ICD-10-CM | POA: Diagnosis not present

## 2020-07-13 DIAGNOSIS — I129 Hypertensive chronic kidney disease with stage 1 through stage 4 chronic kidney disease, or unspecified chronic kidney disease: Secondary | ICD-10-CM | POA: Diagnosis not present

## 2020-07-13 DIAGNOSIS — D0512 Intraductal carcinoma in situ of left breast: Secondary | ICD-10-CM | POA: Diagnosis not present

## 2020-07-13 DIAGNOSIS — R911 Solitary pulmonary nodule: Secondary | ICD-10-CM | POA: Diagnosis not present

## 2020-07-13 DIAGNOSIS — E1121 Type 2 diabetes mellitus with diabetic nephropathy: Secondary | ICD-10-CM

## 2020-07-13 DIAGNOSIS — N1832 Chronic kidney disease, stage 3b: Secondary | ICD-10-CM | POA: Diagnosis not present

## 2020-07-13 DIAGNOSIS — Z7982 Long term (current) use of aspirin: Secondary | ICD-10-CM | POA: Insufficient documentation

## 2020-07-13 DIAGNOSIS — Z170 Estrogen receptor positive status [ER+]: Secondary | ICD-10-CM | POA: Diagnosis not present

## 2020-07-13 DIAGNOSIS — E1122 Type 2 diabetes mellitus with diabetic chronic kidney disease: Secondary | ICD-10-CM

## 2020-07-13 DIAGNOSIS — N390 Urinary tract infection, site not specified: Secondary | ICD-10-CM

## 2020-07-13 LAB — POCT URINALYSIS DIPSTICK
Bilirubin, UA: NEGATIVE
Glucose, UA: NEGATIVE
Ketones, UA: NEGATIVE
Nitrite, UA: POSITIVE
Protein, UA: POSITIVE — AB
Spec Grav, UA: 1.02 (ref 1.010–1.025)
Urobilinogen, UA: 0.2 E.U./dL
pH, UA: 5.5 (ref 5.0–8.0)

## 2020-07-13 MED ORDER — TAMOXIFEN CITRATE 20 MG PO TABS: 20.0000 mg | ORAL_TABLET | Freq: Every day | ORAL | 3 refills | Status: DC

## 2020-07-13 NOTE — Assessment & Plan Note (Signed)
08/21/2019: Left lumpectomy Dr. Marlou Starks: Low-grade DCIS 0.4 cm, margins clear, ER 100%, PR 100% Tis NX stage 0  Treatment plan: 1.Adjuvant radiation therapy 09/17/2019-10/07/2019 2.Followed by tamoxifen for 5 years -------------------------------------------------------------------------------------------------------------------------------------------- Tamoxifen toxicities:  Breast cancer surveillance: 1.  Breast exam 07/13/2020: Benign 2. Mammogram 06/22/2020: [facility]  Bone density 02/27/2020: T score -1.3: Mild osteopenia  Return to clinic in 1 year for follow-up

## 2020-07-13 NOTE — Progress Notes (Signed)
Patient Care Team: [name], MD as PCP - General (Internal Medicine) Daneen Schick as Social Worker Kyung Rudd, MD as Consulting Physician (Radiation Oncology) Jovita Kussmaul, MD as Consulting Physician (General Surgery) Nicholas Lose, MD as Consulting Physician (Hematology and Oncology)  DIAGNOSIS:    ICD-10-CM   1. Ductal carcinoma in situ (DCIS) of left breast  D05.12     SUMMARY OF ONCOLOGIC HISTORY: Oncology History  Ductal carcinoma in situ (DCIS) of left breast  07/10/2019 Cancer Staging   Staging form: Breast, AJCC 8th Edition - Clinical stage from 07/10/2019: Stage 0 (cTis (DCIS), cN0, cM0, ER+, PR+)    07/17/2019 Initial Diagnosis   Routine screening mammogram detected a 0.6cm area of calcifications in the upper outer left breast. Biopsy showed DCIS with calcifications, intermediate grade, ER+100%, PR+ 100% positive.   08/21/2019 Surgery   Left lumpectomy Marlou Starks) [phone]): low grade DCIS spanning 0.4cm, clear margins, and no invasive carcinoma   08/21/2019 Cancer Staging   Staging form: Breast, AJCC 8th Edition - Pathologic stage from 08/21/2019: Stage 0 (pTis (DCIS), pN0, cM0)    09/16/2019 - 10/11/2019 Radiation Therapy   The patient initially received a dose of 42.56 Gy in 16 fractions to the breast using whole-breast tangent fields. This was delivered using a 3-D conformal technique. The patient then received a boost to the seroma. This delivered an additional 8 Gy in 20fractions using a 3 field photon technique due to the depth of the seroma. The total dose was 50.56 Gy.   09/2019 - 09/2024 Anti-estrogen oral therapy   Tamoxifen     CHIEF COMPLIANT: Follow-up of left breast DCIS on tamoxifen  INTERVAL HISTORY: Michele Wang is a 69 y.o. with above-mentioned history of left breast DCIS for which she underwent a lumpectomy, radiation, and is currently on antiestrogen therapy with tamoxifen. She presents to the clinic today for follow-up.    ALLERGIES:   has No Known Allergies.  MEDICATIONS:  Current Outpatient Medications  Medication Sig Dispense Refill  . aspirin EC 81 MG tablet Take 81 mg by mouth daily. Swallow whole.    Marland Kitchen atorvastatin (LIPITOR) 40 MG tablet Take 1 tablet by mouth Monday Wed Friday 90 tablet 2  . Continuous Blood Gluc Receiver (FREESTYLE LIBRE 14 DAY READER) DEVI 1 each by Does not apply route QID. 2 each 2  . Continuous Blood Gluc Sensor (FREESTYLE LIBRE 14 DAY SENSOR) MISC Use as directed to check blood sugars 4 times per day dx: e11.65 2 each 2  . insulin detemir (LEVEMIR FLEXTOUCH) 100 UNIT/ML FlexPen Inject 20 units subcutaneously daily at bedtime, max dose 60 units 15 pen 2  . Insulin Pen Needle (PEN NEEDLES 3/16") 31G X 5 MM MISC Use as directed, once daily. 90 each 2  . Olmesartan-amLODIPine-HCTZ (TRIBENZOR) 40-5-25 MG TABS Take 1 tablet by mouth daily. 90 tablet 2  . tamoxifen (NOLVADEX) 20 MG tablet Take 1 tablet (20 mg total) by mouth daily. 90 tablet 3  . Vitamin D, Cholecalciferol, 50 MCG (2000 UT) CAPS Take 2,000 Units by mouth daily.      No current facility-administered medications for this visit.    PHYSICAL EXAMINATION: ECOG PERFORMANCE STATUS: 1 - Symptomatic but completely ambulatory  Vitals:   07/13/20 1352  BP: 128/81  Pulse: 72  Resp: 17  Temp: 98.5 F (36.9 C)  SpO2: 100%   Filed Weights   07/13/20 1352  Weight: 166 lb 6.4 oz (75.5 kg)    BREAST: No palpable masses or nodules  in either right or left breasts. No palpable axillary supraclavicular or infraclavicular adenopathy no breast tenderness or nipple discharge. (exam performed in the presence of a chaperone)  LABORATORY DATA:  I have reviewed the data as listed CMP Latest Ref Rng & Units 01/20/2020 01/11/2020 01/11/2020  Glucose 65 - 99 mg/dL 140(H) 271(H) 204(H)  BUN 8 - 27 mg/dL 14 46(H) 47(H)  Creatinine 0.57 - 1.00 mg/dL 1.40(H) 1.97(H) 2.06(H)  Sodium 134 - 144 mmol/L 138 135 137  Potassium 3.5 - 5.2 mmol/L 3.9 3.7 3.6    Chloride 96 - 106 mmol/L 100 99 99  CO2 20 - 29 mmol/L 25 26 24   Calcium 8.7 - 10.3 mg/dL 9.8 9.0 9.5  Total Protein 6.0 - 8.5 g/dL - - -  Total Bilirubin 0.0 - 1.2 mg/dL - - -  Alkaline Phos 39 - 117 IU/L - - -  AST 0 - 40 IU/L - - -  ALT 0 - 32 IU/L - - -    Lab Results  Component Value Date   WBC 4.2 01/20/2020   HGB 11.2 01/20/2020   HCT 33.7 (L) 01/20/2020   MCV 89 01/20/2020   PLT 205 01/20/2020   NEUTROABS 1.3 (L) 01/20/2020    ASSESSMENT & PLAN:  Ductal carcinoma in situ (DCIS) of left breast 08/21/2019: Left lumpectomy Dr. Marlou Starks: Low-grade DCIS 0.4 cm, margins clear, ER 100%, PR 100% Tis NX stage 0  Treatment plan: 1.Adjuvant radiation therapy 09/17/2019-10/07/2019 2.Followed by tamoxifen for 5 years -------------------------------------------------------------------------------------------------------------------------------------------- Tamoxifen toxicities: Intermittent hot flashes but bearable  Breast cancer surveillance: 1.  Breast exam 07/13/2020: Benign 2. Mammogram 06/22/2020: [facility]  Bone density 02/27/2020: T score -1.3: Mild osteopenia I renewed her prescription for tamoxifen through [place]  Return to clinic in 1 year for follow-up    No orders of the defined types were placed in this encounter.  The patient has a good understanding of the overall plan. she agrees with it. she will call with any problems that may develop before the next visit here.  Total time spent: 20 mins including face to face time and time spent for planning, charting and coordination of care  Nicholas Lose, MD 07/13/2020  I, Cloyde Reams Dorshimer, am acting as scribe for Dr. Nicholas Lose.  I have reviewed the above documentation for accuracy and completeness, and I agree with the above.

## 2020-07-13 NOTE — Progress Notes (Signed)
I,[name],acting as a Education administrator for [name], MD.,have documented all relevant documentation on the behalf of [name], MD,as directed by  [name], MD while in the presence of [name], MD.   This visit occurred during the SARS-CoV-2 public health emergency.  Safety protocols were in place, including screening questions prior to the visit, additional usage of staff PPE, and extensive cleaning of exam room while observing appropriate contact time as indicated for disinfecting solutions.  Subjective:     Patient ID: Michele Wang , female    DOB: 02-20-1951 , 69 y.o.   MRN: [id number]   Chief Complaint  Patient presents with  . Annual Exam  . Diabetes  . Hypertension    HPI  She is here today for a physical examination.  visit. She is followed by GYN for her pelvic examinations.   Diabetes She presents for her follow-up diabetic visit. She has type 2 diabetes mellitus. Her disease course has been stable. There are no hypoglycemic associated symptoms. Pertinent negatives for diabetes include no blurred vision and no chest pain. There are no hypoglycemic complications. Risk factors for coronary artery disease include diabetes mellitus, dyslipidemia, hypertension, post-menopausal and sedentary lifestyle. She is following a generally healthy diet. She participates in exercise intermittently. Her home blood glucose trend is fluctuating minimally. Her breakfast blood glucose is taken between 8-9 am. Her breakfast blood glucose range is generally 110-130 mg/dl. An ACE inhibitor/angiotensin II receptor blocker is being taken.  Hypertension This is a chronic problem. The current episode started more than 1 year ago. The problem has been gradually improving since onset. Pertinent negatives include no blurred vision, chest pain, palpitations or shortness of breath.     Past Medical History:  Diagnosis Date  . Breast cancer ([place])   . CKD (chronic kidney disease)   .  Diabetes mellitus without complication ([place])    Type II  . Hyperlipidemia   . Hypertension   . UTI (urinary tract infection)      Family History  Problem Relation Age of Onset  . Aneurysm Mother   . Healthy Father      Current Outpatient Medications:  .  aspirin EC 81 MG tablet, Take 81 mg by mouth daily. Swallow whole., Disp: , Rfl:  .  atorvastatin (LIPITOR) 40 MG tablet, Take 1 tablet by mouth Monday Wed Friday, Disp: 90 tablet, Rfl: 2 .  Continuous Blood Gluc Receiver (FREESTYLE LIBRE 14 DAY READER) DEVI, 1 each by Does not apply route QID., Disp: 2 each, Rfl: 2 .  Continuous Blood Gluc Sensor (FREESTYLE LIBRE 14 DAY SENSOR) MISC, Use as directed to check blood sugars 4 times per day dx: e11.65, Disp: 2 each, Rfl: 2 .  insulin detemir (LEVEMIR FLEXTOUCH) 100 UNIT/ML FlexPen, Inject 20 units subcutaneously daily at bedtime, max dose 60 units, Disp: 15 pen, Rfl: 2 .  Insulin Pen Needle (PEN NEEDLES 3/16") 31G X 5 MM MISC, Use as directed, once daily., Disp: 90 each, Rfl: 2 .  Olmesartan-amLODIPine-HCTZ (TRIBENZOR) 40-5-25 MG TABS, Take 1 tablet by mouth daily., Disp: 90 tablet, Rfl: 2 .  Vitamin D, Cholecalciferol, 50 MCG (2000 UT) CAPS, Take 2,000 Units by mouth daily. , Disp: , Rfl:  .  tamoxifen (NOLVADEX) 20 MG tablet, Take 1 tablet (20 mg total) by mouth daily., Disp: 90 tablet, Rfl: 3   No Known Allergies    The patient states she uses post menopausal status for birth control. Last LMP was No  LMP recorded. Patient is postmenopausal.. Negative for Dysmenorrhea. Negative for: breast discharge, breast lump(s), breast pain and breast self exam. Associated symptoms include abnormal vaginal bleeding. Pertinent negatives include abnormal bleeding (hematology), anxiety, decreased libido, depression, difficulty falling sleep, dyspareunia, history of infertility, nocturia, sexual dysfunction, sleep disturbances, urinary incontinence, urinary urgency, vaginal discharge and vaginal itching.  Diet regular.The patient states her exercise level is  intermittent.   . The patient's tobacco use is:  Social History   Tobacco Use  Smoking Status Never Smoker  Smokeless Tobacco Never Used  . She has been exposed to passive smoke. The patient's alcohol use is:  Social History   Substance and Sexual Activity  Alcohol Use No     Review of Systems  Constitutional: Negative.   HENT: Negative.   Eyes: Negative.  Negative for blurred vision.  Respiratory: Negative.  Negative for shortness of breath.   Cardiovascular: Negative.  Negative for chest pain and palpitations.  Gastrointestinal: Negative.   Endocrine: Negative.   Genitourinary: Negative.   Musculoskeletal: Negative.   Skin: Negative.   Allergic/Immunologic: Negative.   Neurological: Negative.   Hematological: Negative.   Psychiatric/Behavioral: Negative.      Today's Vitals   07/13/20 1036  BP: 124/80  Pulse: 67  Temp: 97.9 F (36.6 C)  TempSrc: Oral  Weight: 165 lb 6.4 oz (75 kg)  Height: 5' 6"  (1.676 m)  PainSc: 0-No pain   Body mass index is 26.7 kg/m.  Wt Readings from Last 3 Encounters:  07/13/20 166 lb 6.4 oz (75.5 kg)  07/13/20 165 lb 6.4 oz (75 kg)  02/24/20 161 lb 9.6 oz (73.3 kg)   Objective:  Physical Exam Constitutional:      General: She is not in acute distress.    Appearance: Normal appearance. She is well-developed.  HENT:     Head: Normocephalic and atraumatic.     Right Ear: Hearing, tympanic membrane, ear canal and external ear normal. There is no impacted cerumen.     Left Ear: Hearing, tympanic membrane, ear canal and external ear normal. There is no impacted cerumen.     Nose: Nose normal.     Mouth/Throat:     Mouth: Mucous membranes are dry.  Eyes:     General: Lids are normal.     Extraocular Movements: Extraocular movements intact.     Conjunctiva/sclera: Conjunctivae normal.     Pupils: Pupils are equal, round, and reactive to light.     Funduscopic exam:    Right  eye: No papilledema.        Left eye: No papilledema.  Neck:     Thyroid: No thyroid mass.     Vascular: No carotid bruit.  Cardiovascular:     Rate and Rhythm: Normal rate and regular rhythm.     Pulses: Normal pulses.          Dorsalis pedis pulses are 2+ on the right side and 2+ on the left side.     Heart sounds: Normal heart sounds. No murmur heard.   Pulmonary:     Effort: Pulmonary effort is normal.     Breath sounds: Normal breath sounds.  Chest:     Breasts:        Right: Normal.        Left: Normal.     Comments: Healed surgical scar on left breast. Also with some hyperpigmentation. Abdominal:     General: Abdomen is flat. Bowel sounds are normal. There is no distension.  Palpations: Abdomen is soft.     Tenderness: There is no abdominal tenderness.  Genitourinary:    Rectum: Guaiac result negative.  Musculoskeletal:        General: No swelling. Normal range of motion.     Cervical back: Full passive range of motion without pain, normal range of motion and neck supple.     Right lower leg: No edema.     Left lower leg: No edema.  Feet:     Right foot:     Protective Sensation: 5 sites tested. 5 sites sensed.     Skin integrity: Callus and dry skin present.     Toenail Condition: Right toenails are normal.     Left foot:     Protective Sensation: 5 sites tested. 5 sites sensed.     Skin integrity: Callus and dry skin present.     Toenail Condition: Left toenails are normal.  Skin:    General: Skin is warm and dry.     Capillary Refill: Capillary refill takes less than 2 seconds.  Neurological:     General: No focal deficit present.     Mental Status: She is alert and oriented to person, place, and time.     Cranial Nerves: No cranial nerve deficit.     Sensory: No sensory deficit.  Psychiatric:        Mood and Affect: Mood normal.        Behavior: Behavior normal.        Thought Content: Thought content normal.        Judgment: Judgment normal.          Assessment And Plan:     1. Routine general medical examination at a health care facility Comments:  A full exam was performed. Importance of monthly self breast exams was discussed with the patient. PATIENT IS ADVISED TO GET 30-45 MINUTES REGULAR EXERCISE NO LESS THAN FOUR TO FIVE DAYS PER WEEK - BOTH WEIGHTBEARING EXERCISES AND AEROBIC ARE RECOMMENDED.  PATIENT IS ADVISED TO FOLLOW A HEALTHY DIET WITH AT LEAST SIX FRUITS/VEGGIES PER DAY, DECREASE INTAKE OF RED MEAT, AND TO INCREASE FISH INTAKE TO TWO DAYS PER WEEK.  MEATS/FISH SHOULD NOT BE FRIED, BAKED OR BROILED IS PREFERABLE.  I SUGGEST WEARING SPF 50 SUNSCREEN ON EXPOSED PARTS AND ESPECIALLY WHEN IN THE DIRECT SUNLIGHT FOR AN EXTENDED PERIOD OF TIME.  PLEASE AVOID FAST FOOD RESTAURANTS AND INCREASE YOUR WATER INTAKE.   2. Type 2 diabetes mellitus with stage 3b chronic kidney disease, without long-term current use of insulin ([place]) Comments: Diabetic foot exam was performed. I DISCUSSED WITH THE PATIENT AT LENGTH REGARDING THE GOALS OF GLYCEMIC CONTROL AND POSSIBLE LONG-TERM COMPLICATIONS.  I  ALSO STRESSED THE IMPORTANCE OF COMPLIANCE WITH HOME GLUCOSE MONITORING, DIETARY RESTRICTIONS INCLUDING AVOIDANCE OF SUGARY DRINKS/PROCESSED FOODS,  ALONG WITH REGULAR EXERCISE.  I  ALSO STRESSED THE IMPORTANCE OF ANNUAL EYE EXAMS, SELF FOOT CARE AND COMPLIANCE WITH OFFICE VISITS.  - Hemoglobin A1c - CBC - CMP14+EGFR - Lipid panel - Protein electrophoresis, serum - Phosphorus - Parathyroid Hormone, Intact w/Ca  3. Parenchymal renal hypertension, stage 1 through stage 4 or unspecified chronic kidney disease Comments: Chronic, well controlled. She will continue with current meds. EKG performed, atrial rhythm w/ LVH.  I will be sure to send her nephrologist a copy of her labs. - EKG 12-Lead  4. Overweight (BMI 25.0-29.9)  Her weight is stable for her demographic. She is encouraged to incorporate at least 150 minutes of exercise  per week.   5. Lung  nodule Comments: I will send her for repeat CT scan. She is in agreement with her treatment plan.   - CT Chest Wo Contrast; Future  6. Urinary tract infection without hematuria, site unspecified Comments: u/a positive for UTI. I will send off culture since she is having recurrent sx.  - Culture, Urine - POCT Urinalysis Dipstick (16967) She is encouraged to exercise at least 150 minutes  per week to decrease her cardiac risk.   Patient was given opportunity to ask questions. Patient verbalized understanding of the plan and was able to repeat key elements of the plan. All questions were answered to their satisfaction.   [name], MD   I, [name], MD, have reviewed all documentation for this visit. The documentation on 08/02/20 for the exam, diagnosis, procedures, and orders are all accurate and complete.  THE PATIENT IS ENCOURAGED TO PRACTICE SOCIAL DISTANCING DUE TO THE COVID-19 PANDEMIC.

## 2020-07-13 NOTE — Patient Instructions (Signed)
Health Maintenance After Age 69 After age 69, you are at a higher risk for certain long-term diseases and infections as well as injuries from falls. Falls are a major cause of broken bones and head injuries in people who are older than age 69. Getting regular preventive care can help to keep you healthy and well. Preventive care includes getting regular testing and making lifestyle changes as recommended by your health care provider. Talk with your health care provider about:  Which screenings and tests you should have. A screening is a test that checks for a disease when you have no symptoms.  A diet and exercise plan that is right for you. What should I know about screenings and tests to prevent falls? Screening and testing are the best ways to find a health problem early. Early diagnosis and treatment give you the best chance of managing medical conditions that are common after age 69. Certain conditions and lifestyle choices may make you more likely to have a fall. Your health care provider may recommend:  Regular vision checks. Poor vision and conditions such as cataracts can make you more likely to have a fall. If you wear glasses, make sure to get your prescription updated if your vision changes.  Medicine review. Work with your health care provider to regularly review all of the medicines you are taking, including over-the-counter medicines. Ask your health care provider about any side effects that may make you more likely to have a fall. Tell your health care provider if any medicines that you take make you feel dizzy or sleepy.  Osteoporosis screening. Osteoporosis is a condition that causes the bones to get weaker. This can make the bones weak and cause them to break more easily.  Blood pressure screening. Blood pressure changes and medicines to control blood pressure can make you feel dizzy.  Strength and balance checks. Your health care provider may recommend certain tests to check your  strength and balance while standing, walking, or changing positions.  Foot health exam. Foot pain and numbness, as well as not wearing proper footwear, can make you more likely to have a fall.  Depression screening. You may be more likely to have a fall if you have a fear of falling, feel emotionally low, or feel unable to do activities that you used to do.  Alcohol use screening. Using too much alcohol can affect your balance and may make you more likely to have a fall. What actions can I take to lower my risk of falls? General instructions  Talk with your health care provider about your risks for falling. Tell your health care provider if: ? You fall. Be sure to tell your health care provider about all falls, even ones that seem minor. ? You feel dizzy, sleepy, or off-balance.  Take over-the-counter and prescription medicines only as told by your health care provider. These include any supplements.  Eat a healthy diet and maintain a healthy weight. A healthy diet includes low-fat dairy products, low-fat (lean) meats, and fiber from whole grains, beans, and lots of fruits and vegetables. Home safety  Remove any tripping hazards, such as rugs, cords, and clutter.  Install safety equipment such as grab bars in bathrooms and safety rails on stairs.  Keep rooms and walkways well-lit. Activity   Follow a regular exercise program to stay fit. This will help you maintain your balance. Ask your health care provider what types of exercise are appropriate for you.  If you need a cane or   walker, use it as recommended by your health care provider.  Wear supportive shoes that have nonskid soles. Lifestyle  Do not drink alcohol if your health care provider tells you not to drink.  If you drink alcohol, limit how much you have: ? 0-1 drink a day for women. ? 0-2 drinks a day for men.  Be aware of how much alcohol is in your drink. In the [place] one drink equals one typical bottle of beer (12  oz), one-half glass of wine (5 oz), or one shot of hard liquor (1 oz).  Do not use any products that contain nicotine or tobacco, such as cigarettes and e-cigarettes. If you need help quitting, ask your health care provider. Summary  Having a healthy lifestyle and getting preventive care can help to protect your health and wellness after age 69.  Screening and testing are the best way to find a health problem early and help you avoid having a fall. Early diagnosis and treatment give you the best chance for managing medical conditions that are more common for people who are older than age 69.  Falls are a major cause of broken bones and head injuries in people who are older than age 69. Take precautions to prevent a fall at home.  Work with your health care provider to learn what changes you can make to improve your health and wellness and to prevent falls. This information is not intended to replace advice given to you by your health care provider. Make sure you discuss any questions you have with your health care provider. Document Revised: 03/07/2019 Document Reviewed: 09/27/2017 Elsevier Patient Education  2020 [employer]  

## 2020-07-14 ENCOUNTER — Telehealth: Payer: Self-pay | Admitting: Hematology and Oncology

## 2020-07-14 LAB — LIPID PANEL
Chol/HDL Ratio: 2.3 ratio (ref 0.0–4.4)
Cholesterol, Total: 155 mg/dL (ref 100–199)
HDL: 68 mg/dL (ref >39)
LDL Chol Calc (NIH): 67 mg/dL (ref 0–99)
Triglycerides: 111 mg/dL (ref 0–149)
VLDL Cholesterol Cal: 20 mg/dL (ref 5–40)

## 2020-07-14 NOTE — Telephone Encounter (Signed)
Scheduled per 8/16 los. Called and spoke with pt, confirmed 8/16 appt  

## 2020-07-15 LAB — URINE CULTURE

## 2020-07-15 LAB — CMP14+EGFR
ALT: 31 IU/L (ref 0–32)
AST: 33 IU/L (ref 0–40)
Albumin/Globulin Ratio: 1.5 (ref 1.2–2.2)
Albumin: 4.5 g/dL (ref 3.8–4.8)
Alkaline Phosphatase: 65 IU/L (ref 48–121)
BUN/Creatinine Ratio: 15 (ref 12–28)
BUN: 19 mg/dL (ref 8–27)
Bilirubin Total: 0.3 mg/dL (ref 0.0–1.2)
CO2: 27 mmol/L (ref 20–29)
Calcium: 10.4 mg/dL — ABNORMAL HIGH (ref 8.7–10.3)
Chloride: 99 mmol/L (ref 96–106)
Creatinine, Ser: 1.26 mg/dL — ABNORMAL HIGH (ref 0.57–1.00)
GFR calc Af Amer: 50 mL/min/{1.73_m2} — ABNORMAL LOW (ref >59)
GFR calc non Af Amer: 44 mL/min/{1.73_m2} — ABNORMAL LOW (ref >59)
Globulin, Total: 3 g/dL (ref 1.5–4.5)
Glucose: 166 mg/dL — ABNORMAL HIGH (ref 65–99)
Potassium: 4.2 mmol/L (ref 3.5–5.2)
Sodium: 138 mmol/L (ref 134–144)
Total Protein: 7.5 g/dL (ref 6.0–8.5)

## 2020-07-15 LAB — PROTEIN ELECTROPHORESIS, SERUM
A/G Ratio: 1.1 (ref 0.7–1.7)
Albumin ELP: 4 g/dL (ref 2.9–4.4)
Alpha 1: 0.2 g/dL (ref 0.0–0.4)
Alpha 2: 0.8 g/dL (ref 0.4–1.0)
Beta: 1.1 g/dL (ref 0.7–1.3)
Gamma Globulin: 1.5 g/dL (ref 0.4–1.8)
Globulin, Total: 3.5 g/dL (ref 2.2–3.9)

## 2020-07-15 LAB — PTH, INTACT AND CALCIUM: PTH: 26 pg/mL (ref 15–65)

## 2020-07-15 LAB — PHOSPHORUS: Phosphorus: 3.4 mg/dL (ref 3.0–4.3)

## 2020-07-15 LAB — CBC
Hematocrit: 34.8 % (ref 34.0–46.6)
Hemoglobin: 11.4 g/dL (ref 11.1–15.9)
MCH: 29.5 pg (ref 26.6–33.0)
MCHC: 32.8 g/dL (ref 31.5–35.7)
MCV: 90 fL (ref 79–97)
Platelets: 287 10*3/uL (ref 150–450)
RBC: 3.87 x10E6/uL (ref 3.77–5.28)
RDW: 13.3 % (ref 11.7–15.4)
WBC: 5 10*3/uL (ref 3.4–10.8)

## 2020-07-15 LAB — HEMOGLOBIN A1C
Est. average glucose Bld gHb Est-mCnc: 206 mg/dL
Hgb A1c MFr Bld: 8.8 % — ABNORMAL HIGH (ref 4.8–5.6)

## 2020-07-17 ENCOUNTER — Other Ambulatory Visit: Payer: Self-pay | Admitting: Internal Medicine

## 2020-07-17 MED ORDER — NITROFURANTOIN MONOHYD MACRO 100 MG PO CAPS: 100.0000 mg | ORAL_CAPSULE | Freq: Two times a day (BID) | ORAL | 0 refills | Status: AC

## 2020-07-21 ENCOUNTER — Other Ambulatory Visit: Payer: Self-pay | Admitting: Internal Medicine

## 2020-07-24 ENCOUNTER — Other Ambulatory Visit: Payer: Self-pay

## 2020-07-24 ENCOUNTER — Ambulatory Visit
Admission: RE | Admit: 2020-07-24 | Discharge: 2020-07-24 | Disposition: A | Discharge status: Home / Self Care | Payer: Medicare PPO | Source: Ambulatory Visit | Attending: Internal Medicine | Admitting: Internal Medicine

## 2020-07-24 DIAGNOSIS — I700 Atherosclerosis of aorta: Secondary | ICD-10-CM | POA: Diagnosis not present

## 2020-07-24 DIAGNOSIS — R911 Solitary pulmonary nodule: Secondary | ICD-10-CM

## 2020-07-24 DIAGNOSIS — Z9049 Acquired absence of other specified parts of digestive tract: Secondary | ICD-10-CM | POA: Diagnosis not present

## 2020-07-24 DIAGNOSIS — R918 Other nonspecific abnormal finding of lung field: Secondary | ICD-10-CM | POA: Diagnosis not present

## 2020-07-27 ENCOUNTER — Telehealth: Payer: Self-pay

## 2020-07-27 NOTE — Telephone Encounter (Signed)
Patient notified

## 2020-07-27 NOTE — Telephone Encounter (Signed)
-----   Message from [name], MD sent at 07/24/2020 10:35 PM EDT ----- CT chest results: Stable right upper lobe lung nodule. No new nodules noted. No further work-up needed.  Aortic atherosclerosis is present. This means there is plaque in your aorta. It is important to follow a heart healthy diet and to be compliant with cholesterol medication. Be sure to take atorvastatin as directed.   Please let me know if you have any questions.   Take care,   RS

## 2020-08-27 ENCOUNTER — Emergency Department (HOSPITAL_COMMUNITY)
Admission: EM | Admit: 2020-08-27 | Discharge: 2020-08-28 | Disposition: A | Discharge status: Home / Self Care | Payer: Medicare PPO | Attending: Emergency Medicine | Admitting: Emergency Medicine

## 2020-08-27 ENCOUNTER — Encounter (HOSPITAL_COMMUNITY): Payer: Self-pay | Admitting: Emergency Medicine

## 2020-08-27 ENCOUNTER — Telehealth: Payer: Self-pay

## 2020-08-27 ENCOUNTER — Other Ambulatory Visit: Payer: Self-pay

## 2020-08-27 DIAGNOSIS — Z79899 Other long term (current) drug therapy: Secondary | ICD-10-CM | POA: Diagnosis not present

## 2020-08-27 DIAGNOSIS — Z794 Long term (current) use of insulin: Secondary | ICD-10-CM | POA: Insufficient documentation

## 2020-08-27 DIAGNOSIS — E1122 Type 2 diabetes mellitus with diabetic chronic kidney disease: Secondary | ICD-10-CM | POA: Insufficient documentation

## 2020-08-27 DIAGNOSIS — Z853 Personal history of malignant neoplasm of breast: Secondary | ICD-10-CM | POA: Insufficient documentation

## 2020-08-27 DIAGNOSIS — Z7982 Long term (current) use of aspirin: Secondary | ICD-10-CM | POA: Insufficient documentation

## 2020-08-27 DIAGNOSIS — R739 Hyperglycemia, unspecified: Secondary | ICD-10-CM | POA: Diagnosis present

## 2020-08-27 DIAGNOSIS — I129 Hypertensive chronic kidney disease with stage 1 through stage 4 chronic kidney disease, or unspecified chronic kidney disease: Secondary | ICD-10-CM | POA: Insufficient documentation

## 2020-08-27 DIAGNOSIS — E1165 Type 2 diabetes mellitus with hyperglycemia: Secondary | ICD-10-CM | POA: Diagnosis not present

## 2020-08-27 DIAGNOSIS — N183 Chronic kidney disease, stage 3 unspecified: Secondary | ICD-10-CM | POA: Diagnosis not present

## 2020-08-27 LAB — CBG MONITORING, ED
Glucose-Capillary: 438 mg/dL — ABNORMAL HIGH (ref 70–99)
Glucose-Capillary: 488 mg/dL — ABNORMAL HIGH (ref 70–99)

## 2020-08-27 LAB — CBC WITH DIFFERENTIAL/PLATELET
Abs Immature Granulocytes: 0.02 10*3/uL (ref 0.00–0.07)
Basophils Absolute: 0.1 10*3/uL (ref 0.0–0.1)
Basophils Relative: 1 %
Eosinophils Absolute: 0.1 10*3/uL (ref 0.0–0.5)
Eosinophils Relative: 2 %
HCT: 37.4 % (ref 36.0–46.0)
Hemoglobin: 12 g/dL (ref 12.0–15.0)
Immature Granulocytes: 0 %
Lymphocytes Relative: 36 %
Lymphs Abs: 2.2 10*3/uL (ref 0.7–4.0)
MCH: 28.8 pg (ref 26.0–34.0)
MCHC: 32.1 g/dL (ref 30.0–36.0)
MCV: 89.9 fL (ref 80.0–100.0)
Monocytes Absolute: 0.7 10*3/uL (ref 0.1–1.0)
Monocytes Relative: 11 %
Neutro Abs: 3.1 10*3/uL (ref 1.7–7.7)
Neutrophils Relative %: 50 %
Platelets: 224 10*3/uL (ref 150–400)
RBC: 4.16 MIL/uL (ref 3.87–5.11)
RDW: 14 % (ref 11.5–15.5)
WBC: 6.1 10*3/uL (ref 4.0–10.5)
nRBC: 0 % (ref 0.0–0.2)

## 2020-08-27 LAB — COMPREHENSIVE METABOLIC PANEL
ALT: 28 U/L (ref 0–44)
AST: 24 U/L (ref 15–41)
Albumin: 3.9 g/dL (ref 3.5–5.0)
Alkaline Phosphatase: 57 U/L (ref 38–126)
Anion gap: 12 (ref 5–15)
BUN: 36 mg/dL — ABNORMAL HIGH (ref 8–23)
CO2: 26 mmol/L (ref 22–32)
Calcium: 10.3 mg/dL (ref 8.9–10.3)
Chloride: 94 mmol/L — ABNORMAL LOW (ref 98–111)
Creatinine, Ser: 1.92 mg/dL — ABNORMAL HIGH (ref 0.44–1.00)
GFR calc Af Amer: 30 mL/min — ABNORMAL LOW (ref >60)
GFR calc non Af Amer: 26 mL/min — ABNORMAL LOW (ref >60)
Glucose, Bld: 470 mg/dL — ABNORMAL HIGH (ref 70–99)
Potassium: 3.8 mmol/L (ref 3.5–5.1)
Sodium: 132 mmol/L — ABNORMAL LOW (ref 135–145)
Total Bilirubin: 0.9 mg/dL (ref 0.3–1.2)
Total Protein: 7.9 g/dL (ref 6.5–8.1)

## 2020-08-27 LAB — URINALYSIS, ROUTINE W REFLEX MICROSCOPIC
Bilirubin Urine: NEGATIVE
Glucose, UA: >=500 mg/dL — AB
Ketones, ur: NEGATIVE mg/dL
Nitrite: NEGATIVE
Protein, ur: NEGATIVE mg/dL
Specific Gravity, Urine: 1.02 (ref 1.005–1.030)
pH: 5 (ref 5.0–8.0)

## 2020-08-27 NOTE — Telephone Encounter (Signed)
Pt daughter called to inform us that her mothers blood sugar machine was just reading "HIGH" called patient to get more information. Patient had gravy biscuit and gravy for breakfast and chicken livers and corn for dinner. Pt advised to be more cautious of eating habits. She is coming in on Tuesday for sliding scale insulin training and referral to nutritionist.  Pt also advised to check blood sugars at least twice daily and bring log to all appointments.

## 2020-08-27 NOTE — ED Triage Notes (Signed)
Pt states she has a monitor on her arm and is just reading high. No complain of nausea or vomiting cbg 438 on triage.

## 2020-08-28 DIAGNOSIS — E1165 Type 2 diabetes mellitus with hyperglycemia: Secondary | ICD-10-CM | POA: Diagnosis not present

## 2020-08-28 LAB — CBG MONITORING, ED
Glucose-Capillary: 423 mg/dL — ABNORMAL HIGH (ref 70–99)
Glucose-Capillary: 410 mg/dL — ABNORMAL HIGH (ref 70–99)
Glucose-Capillary: 424 mg/dL — ABNORMAL HIGH (ref 70–99)
Glucose-Capillary: 362 mg/dL — ABNORMAL HIGH (ref 70–99)
Glucose-Capillary: 325 mg/dL — ABNORMAL HIGH (ref 70–99)

## 2020-08-28 MED ORDER — SODIUM CHLORIDE 0.9 % IV BOLUS
1000.0000 mL | Freq: Once | INTRAVENOUS | Status: AC
  Administered 2020-08-28: 1000 mL via INTRAVENOUS

## 2020-08-28 NOTE — ED Notes (Addendum)
Checked Pt sugar again it was 423. Asked if she had been drinking or eating anything unusual and was told no not really other than a few sprites here and there. Pt said this happened last year as well and was admitted for it not going down. Pt states she started taking a One a day multivitamin and hair skin and nail gummies once a day a few months ago and it hasn't affected her sugar.

## 2020-08-28 NOTE — ED Notes (Signed)
Pt d/c home per MD order. Discharge summary reviewed with pt- pt verbalizes understanding. PA to speak to pt daughter per request. Pt husband discharge ride home. Off unit via WC.  No s/s of acute distress noted.

## 2020-08-28 NOTE — Discharge Instructions (Addendum)
Continue your home medications as prescribed. We have sent your urine for a culture.  If it grows any bacteria need to be on antibiotic, we will call you and let you know. Follow-up with your primary care provider. Return to the ER if you start to experience chest pain, abdominal pain, fever or vomiting.

## 2020-08-28 NOTE — ED Provider Notes (Signed)
[facility] EMERGENCY DEPARTMENT Provider Note   CSN: [id number] Arrival date & time: 08/27/20  2113     History Chief Complaint  Patient presents with  . Hyperglycemia    Michele Wang is a 69 y.o. female with a past medical history of IDDM, HTN, HLD, CKD, presenting to the ED with a chief complaint of hyperglycemia.  Patient states that she has been monitor on her arm that checks her blood sugar regularly.  She was told that her blood sugar was high since yesterday.  Reports increased urination and thirst but denies any dysuria.  Denies any fevers, cough, shortness of breath, leg swelling, vomiting, diarrhea, abdominal pain. She has been taking her insulin regularly, but she contacted her PCP yesterday she was told to take 22 units of insulin instead of her usual 20 last night.    HPI     Past Medical History:  Diagnosis Date  . Breast cancer ([place])   . CKD (chronic kidney disease)   . Diabetes mellitus without complication ([place])    Type II  . Hyperlipidemia   . Hypertension   . UTI (urinary tract infection)     Patient Active Problem List   Diagnosis Date Noted  . Moderate nonproliferative diabetic retinopathy of both eyes without macular edema associated with type 2 diabetes mellitus ([place]) 05/19/2020  . Posterior vitreous detachment of left eye 05/19/2020  . Nuclear sclerotic cataract of both eyes 05/19/2020  . Memory deficit 02/24/2020  . Stroke (cerebrum) ([place]) 02/24/2020  . AKI (acute kidney injury) ([place])   . Thrush   . Hyperglycemia 01/10/2020  . Hypertension 01/10/2020  . Acute lower UTI 01/10/2020  . Malignant neoplasm of upper-outer quadrant of left breast in female, estrogen receptor positive ([place]) 12/16/2019  . Ductal carcinoma in situ (DCIS) of left breast 07/17/2019  . Type 2 diabetes mellitus with stage 3 chronic kidney disease, without long-term current use of insulin ([place]) 11/08/2018  . Chronic renal disease, stage III ([place]) 11/08/2018   . Hypertensive nephropathy 11/08/2018  . Pure hypercholesterolemia 11/08/2018  . Spider veins of both lower extremities 08/15/2017    Past Surgical History:  Procedure Laterality Date  . BREAST LUMPECTOMY WITH RADIOACTIVE SEED LOCALIZATION Left 08/21/2019   Procedure: LEFT BREAST LUMPECTOMY WITH RADIOACTIVE SEED LOCALIZATION;  Surgeon: Jovita Kussmaul, MD;  Location: [facility];  Service: General;  Laterality: Left;  . CESAREAN SECTION     x2     OB History   No obstetric history on file.     Family History  Problem Relation Age of Onset  . Aneurysm Mother   . Healthy Father     Social History   Tobacco Use  . Smoking status: Never Smoker  . Smokeless tobacco: Never Used  Vaping Use  . Vaping Use: Never used  Substance Use Topics  . Alcohol use: No  . Drug use: No    Home Medications Prior to Admission medications   Medication Sig Start Date End Date Taking? Authorizing Provider  aspirin EC 81 MG tablet Take 81 mg by mouth daily. Swallow whole.    [provider]  atorvastatin (LIPITOR) 40 MG tablet Take 1 tablet by mouth Monday Wed Friday 03/17/20   [name], MD  Continuous Blood Gluc Receiver (FREESTYLE LIBRE 14 DAY READER) DEVI 1 each by Does not apply route QID. 03/30/20   [name], MD  Continuous Blood Gluc Sensor (FREESTYLE LIBRE 14 DAY SENSOR) MISC Use as directed to  check blood sugars 4 times per day dx: e11.65 06/08/20   [name], MD  insulin detemir (LEVEMIR FLEXTOUCH) 100 UNIT/ML FlexPen Inject 20 units subcutaneously daily at bedtime, max dose 60 units 02/13/20   [name], MD  Insulin Pen Needle (PEN NEEDLES 3/16") 31G X 5 MM MISC Use as directed, once daily. 02/13/20   [name], MD  Olmesartan-amLODIPine-HCTZ [facility]) 40-5-25 MG TABS Take 1 tablet by mouth daily. 02/13/20   [name], MD  tamoxifen (NOLVADEX) 20 MG tablet Take 1 tablet (20 mg total) by mouth daily. 07/13/20   Nicholas Lose, MD  Vitamin D,  Cholecalciferol, 50 MCG (2000 UT) CAPS Take 2,000 Units by mouth daily.     [provider]    Allergies    Patient has no known allergies.  Review of Systems   Review of Systems  Constitutional: Negative for appetite change, chills and fever.  HENT: Negative for ear pain, rhinorrhea, sneezing and sore throat.   Eyes: Negative for photophobia and visual disturbance.  Respiratory: Negative for cough, chest tightness, shortness of breath and wheezing.   Cardiovascular: Negative for chest pain and palpitations.  Gastrointestinal: Negative for abdominal pain, blood in stool, constipation, diarrhea, nausea and vomiting.  Endocrine: Positive for polydipsia and polyuria.  Genitourinary: Negative for dysuria, hematuria and urgency.  Musculoskeletal: Negative for myalgias.  Skin: Negative for rash.  Neurological: Negative for dizziness, weakness and light-headedness.    Physical Exam Updated Vital Signs BP 124/77   Pulse 67   Temp 98.4 F (36.9 C) (Oral)   Resp 17   Ht 5\' 6"  (1.676 m)   Wt 75.5 kg   SpO2 95%   BMI 26.87 kg/m   Physical Exam Vitals and nursing note reviewed.  Constitutional:      General: She is not in acute distress.    Appearance: She is well-developed.     Comments: No acute distress. Speaking in complete sentences without difficulty.  HENT:     Head: Normocephalic and atraumatic.     Nose: Nose normal.  Eyes:     General: No scleral icterus.       Left eye: No discharge.     Conjunctiva/sclera: Conjunctivae normal.  Cardiovascular:     Rate and Rhythm: Normal rate and regular rhythm.     Heart sounds: Normal heart sounds. No murmur heard.  No friction rub. No gallop.   Pulmonary:     Effort: Pulmonary effort is normal. No respiratory distress.     Breath sounds: Normal breath sounds.  Abdominal:     General: Bowel sounds are normal. There is no distension.     Palpations: Abdomen is soft.     Tenderness: There is no abdominal tenderness.  There is no guarding.  Musculoskeletal:        General: Normal range of motion.     Cervical back: Normal range of motion and neck supple.  Skin:    General: Skin is warm and dry.     Findings: No rash.  Neurological:     Mental Status: She is alert.     Motor: No abnormal muscle tone.     Coordination: Coordination normal.     ED Results / Procedures / Treatments   Labs (all labs ordered are listed, but only abnormal results are displayed) Labs Reviewed  COMPREHENSIVE METABOLIC PANEL - Abnormal; Notable for the following components:      Result Value   Sodium 132 (*)    Chloride 94 (*)  Glucose, Bld 470 (*)    BUN 36 (*)    Creatinine, Ser 1.92 (*)    GFR calc non Af Amer 26 (*)    GFR calc Af Amer 30 (*)    All other components within normal limits  URINALYSIS, ROUTINE W REFLEX MICROSCOPIC - Abnormal; Notable for the following components:   APPearance HAZY (*)    Glucose, UA >=500 (*)    Hgb urine dipstick SMALL (*)    Leukocytes,Ua MODERATE (*)    Bacteria, UA FEW (*)    All other components within normal limits  CBG MONITORING, ED - Abnormal; Notable for the following components:   Glucose-Capillary 438 (*)    All other components within normal limits  CBG MONITORING, ED - Abnormal; Notable for the following components:   Glucose-Capillary 488 (*)    All other components within normal limits  CBG MONITORING, ED - Abnormal; Notable for the following components:   Glucose-Capillary 423 (*)    All other components within normal limits  CBG MONITORING, ED - Abnormal; Notable for the following components:   Glucose-Capillary 410 (*)    All other components within normal limits  CBG MONITORING, ED - Abnormal; Notable for the following components:   Glucose-Capillary 424 (*)    All other components within normal limits  CBG MONITORING, ED - Abnormal; Notable for the following components:   Glucose-Capillary 362 (*)    All other components within normal limits  CBG  MONITORING, ED - Abnormal; Notable for the following components:   Glucose-Capillary 325 (*)    All other components within normal limits  URINE CULTURE  CBC WITH DIFFERENTIAL/PLATELET    EKG None  Radiology No results found.  Procedures Procedures (including critical care time)  Medications Ordered in ED Medications  sodium chloride 0.9 % bolus 1,000 mL (1,000 mLs Intravenous New Bag/Given 08/28/20 2694)    ED Course  I have reviewed the triage vital signs and the nursing notes.  Pertinent labs & imaging results that were available during my care of the patient were reviewed by me and considered in my medical decision making (see chart for details).    MDM Rules/Calculators/A&P                          69 year old female with a past medical history of IDDM, hypertension, hyperlipidemia, CKD presenting to the ED with a chief complaint of hyperglycemia.  Has a monitor on her arm which told her that her blood sugar was high yesterday.  Reports increase in urination and thirst but denies any dysuria.  Denies fever, cough, shortness of breath, abdominal pain or other infectious symptoms.  Took 22 units of her insulin last night instead of 20 as told by her PCP.  On exam patient is overall well-appearing.  Abdomen is soft, nontender nondistended.  Patient with initial CBG on arrival of 67.  Urine without any ketones, normal anion gap.  She does have a creatinine of 1.9.  Other lab work is unremarkable.  Patient given a liter of normal saline with improvement in her sugar to 325.  Will add on urine culture. Patient continues to remain in NAD. Will have her continue her home medications, follow up with PCP and return for worsening symptoms.   Patient is hemodynamically stable, in NAD, and able to ambulate in the ED. Evaluation does not show pathology that would require ongoing emergent intervention or inpatient treatment. I explained the diagnosis to the  patient. Pain has been managed and  has no complaints prior to discharge. Patient is comfortable with above plan and is stable for discharge at this time. All questions were answered prior to disposition. Strict return precautions for returning to the ED were discussed. Encouraged follow up with PCP.   An After Visit Summary was printed and given to the patient.   Portions of this note were generated with [employer]obby[employer]. Dictation errors may occur despite best attempts at proofreading.  Final Clinical Impression(s) / ED Diagnoses Final diagnoses:  Hyperglycemia    Rx / DC Orders ED Discharge Orders    None       Delia Heady, PA-C 08/28/20 8478    Maudie Flakes, MD 08/28/20 [phone]

## 2020-08-30 ENCOUNTER — Other Ambulatory Visit: Payer: Self-pay | Admitting: Internal Medicine

## 2020-08-30 ENCOUNTER — Other Ambulatory Visit: Payer: Self-pay

## 2020-08-30 ENCOUNTER — Encounter (HOSPITAL_COMMUNITY): Payer: Self-pay | Admitting: *Deleted

## 2020-08-30 ENCOUNTER — Emergency Department (HOSPITAL_COMMUNITY)
Admission: EM | Admit: 2020-08-30 | Discharge: 2020-08-31 | Disposition: A | Discharge status: Home / Self Care | Payer: Medicare PPO | Attending: Emergency Medicine | Admitting: Emergency Medicine

## 2020-08-30 DIAGNOSIS — Z7982 Long term (current) use of aspirin: Secondary | ICD-10-CM | POA: Insufficient documentation

## 2020-08-30 DIAGNOSIS — Z794 Long term (current) use of insulin: Secondary | ICD-10-CM | POA: Diagnosis not present

## 2020-08-30 DIAGNOSIS — E1122 Type 2 diabetes mellitus with diabetic chronic kidney disease: Secondary | ICD-10-CM | POA: Diagnosis not present

## 2020-08-30 DIAGNOSIS — I129 Hypertensive chronic kidney disease with stage 1 through stage 4 chronic kidney disease, or unspecified chronic kidney disease: Secondary | ICD-10-CM | POA: Diagnosis not present

## 2020-08-30 DIAGNOSIS — E1165 Type 2 diabetes mellitus with hyperglycemia: Secondary | ICD-10-CM | POA: Insufficient documentation

## 2020-08-30 DIAGNOSIS — N183 Chronic kidney disease, stage 3 unspecified: Secondary | ICD-10-CM | POA: Diagnosis not present

## 2020-08-30 DIAGNOSIS — R739 Hyperglycemia, unspecified: Secondary | ICD-10-CM

## 2020-08-30 DIAGNOSIS — E876 Hypokalemia: Secondary | ICD-10-CM | POA: Diagnosis not present

## 2020-08-30 DIAGNOSIS — N390 Urinary tract infection, site not specified: Secondary | ICD-10-CM | POA: Diagnosis not present

## 2020-08-30 DIAGNOSIS — R319 Hematuria, unspecified: Secondary | ICD-10-CM

## 2020-08-30 LAB — URINALYSIS, ROUTINE W REFLEX MICROSCOPIC
Bilirubin Urine: NEGATIVE
Glucose, UA: >=500 mg/dL — AB
Ketones, ur: NEGATIVE mg/dL
Nitrite: NEGATIVE
Protein, ur: NEGATIVE mg/dL
Specific Gravity, Urine: 1.008 (ref 1.005–1.030)
WBC, UA: >50 WBC/hpf — ABNORMAL HIGH (ref 0–5)
pH: 5 (ref 5.0–8.0)

## 2020-08-30 LAB — CBC WITH DIFFERENTIAL/PLATELET
Abs Immature Granulocytes: 0.01 10*3/uL (ref 0.00–0.07)
Basophils Absolute: 0 10*3/uL (ref 0.0–0.1)
Basophils Relative: 1 %
Eosinophils Absolute: 0.1 10*3/uL (ref 0.0–0.5)
Eosinophils Relative: 2 %
HCT: 36.9 % (ref 36.0–46.0)
Hemoglobin: 12.3 g/dL (ref 12.0–15.0)
Immature Granulocytes: 0 %
Lymphocytes Relative: 39 %
Lymphs Abs: 1.9 10*3/uL (ref 0.7–4.0)
MCH: 29.6 pg (ref 26.0–34.0)
MCHC: 33.3 g/dL (ref 30.0–36.0)
MCV: 88.7 fL (ref 80.0–100.0)
Monocytes Absolute: 0.4 10*3/uL (ref 0.1–1.0)
Monocytes Relative: 8 %
Neutro Abs: 2.5 10*3/uL (ref 1.7–7.7)
Neutrophils Relative %: 50 %
Platelets: 235 10*3/uL (ref 150–400)
RBC: 4.16 MIL/uL (ref 3.87–5.11)
RDW: 13.7 % (ref 11.5–15.5)
WBC: 5 10*3/uL (ref 4.0–10.5)
nRBC: 0 % (ref 0.0–0.2)

## 2020-08-30 LAB — BASIC METABOLIC PANEL
Anion gap: 11 (ref 5–15)
BUN: 48 mg/dL — ABNORMAL HIGH (ref 8–23)
CO2: 25 mmol/L (ref 22–32)
Calcium: 9.3 mg/dL (ref 8.9–10.3)
Chloride: 95 mmol/L — ABNORMAL LOW (ref 98–111)
Creatinine, Ser: 2.29 mg/dL — ABNORMAL HIGH (ref 0.44–1.00)
GFR calc Af Amer: 24 mL/min — ABNORMAL LOW (ref >60)
GFR calc non Af Amer: 21 mL/min — ABNORMAL LOW (ref >60)
Glucose, Bld: 266 mg/dL — ABNORMAL HIGH (ref 70–99)
Potassium: 3 mmol/L — ABNORMAL LOW (ref 3.5–5.1)
Sodium: 131 mmol/L — ABNORMAL LOW (ref 135–145)

## 2020-08-30 LAB — URINE CULTURE: Culture: >=100000 — AB

## 2020-08-30 LAB — CBG MONITORING, ED: Glucose-Capillary: 259 mg/dL — ABNORMAL HIGH (ref 70–99)

## 2020-08-30 MED ORDER — SODIUM CHLORIDE 0.9 % IV BOLUS
500.0000 mL | Freq: Once | INTRAVENOUS | Status: AC
  Administered 2020-08-30: 500 mL via INTRAVENOUS

## 2020-08-30 MED ORDER — POTASSIUM CHLORIDE 10 MEQ/100ML IV SOLN: 10.0000 meq | Freq: Once | INTRAVENOUS | Status: DC

## 2020-08-30 MED ORDER — POTASSIUM CHLORIDE CRYS ER 20 MEQ PO TBCR
40.0000 meq | EXTENDED_RELEASE_TABLET | Freq: Once | ORAL | Status: AC
  Administered 2020-08-30: 40 meq via ORAL
  Filled 2020-08-30: qty 2

## 2020-08-30 MED ORDER — POTASSIUM CHLORIDE CRYS ER 20 MEQ PO TBCR: 20.0000 meq | EXTENDED_RELEASE_TABLET | Freq: Two times a day (BID) | ORAL | 0 refills | Status: DC

## 2020-08-30 MED ORDER — SODIUM CHLORIDE 0.9 % IV SOLN
1.0000 g | Freq: Once | INTRAVENOUS | Status: AC
  Administered 2020-08-30: 1 g via INTRAVENOUS
  Filled 2020-08-30: qty 10

## 2020-08-30 MED ORDER — ACETAMINOPHEN 325 MG PO TABS
650.0000 mg | ORAL_TABLET | Freq: Once | ORAL | Status: AC
  Administered 2020-08-30: 650 mg via ORAL
  Filled 2020-08-30: qty 2

## 2020-08-30 MED ORDER — CEPHALEXIN 500 MG PO CAPS: ORAL_CAPSULE | ORAL | 0 refills | Status: DC

## 2020-08-30 NOTE — ED Provider Notes (Signed)
[facility] EMERGENCY DEPARTMENT Provider Note   CSN: [id number] Arrival date & time: 08/30/20  1557     History Chief Complaint  Patient presents with  . Hyperglycemia    Michele Wang is a 69 y.o. female.  HPI      Michele Wang is a 69 y.o. female with past medical history of chronic kidney disease, type 2 diabetes, hypertension, and breast cancer, currently taking radiation therapy and tamoxifen.  She presents to the Emergency Department complaining of continued elevated blood sugar readings at home and dysuria.  She was seen at [facility] Friday for hyperglycemia.  She reports that she had a urine test and culture done at that time and reviewed her culture results on MyChart which showed positive result for E. coli infection.  She contacted her PCP and was advised to come to the emergency department for IV antibiotics.  PCP prescribed cephalexin to also begin twice daily.  This evening, she reports having increased urinary frequency and bilateral flank pain. Some increased thirst today.  Decreased appetite.  She denies fever, chills, abdominal pain, chest pain, shortness of breath and vomiting.    Past Medical History:  Diagnosis Date  . Breast cancer ([place])   . CKD (chronic kidney disease)   . Diabetes mellitus without complication ([place])    Type II  . Hyperlipidemia   . Hypertension   . UTI (urinary tract infection)     Patient Active Problem List   Diagnosis Date Noted  . Moderate nonproliferative diabetic retinopathy of both eyes without macular edema associated with type 2 diabetes mellitus ([employer]) 05/19/2020  . Posterior vitreous detachment of left eye 05/19/2020  . Nuclear sclerotic cataract of both eyes 05/19/2020  . Memory deficit 02/24/2020  . Stroke (cerebrum) ([place]) 02/24/2020  . AKI (acute kidney injury) ([place])   . Thrush   . Hyperglycemia 01/10/2020  . Hypertension 01/10/2020  . Acute lower UTI 01/10/2020  . Malignant neoplasm of upper-outer quadrant of left  breast in female, estrogen receptor positive ([place]) 12/16/2019  . Ductal carcinoma in situ (DCIS) of left breast 07/17/2019  . Type 2 diabetes mellitus with stage 3 chronic kidney disease, without long-term current use of insulin ([place]) 11/08/2018  . Chronic renal disease, stage III ([place]) 11/08/2018  . Hypertensive nephropathy 11/08/2018  . Pure hypercholesterolemia 11/08/2018  . Spider veins of both lower extremities 08/15/2017    Past Surgical History:  Procedure Laterality Date  . BREAST LUMPECTOMY WITH RADIOACTIVE SEED LOCALIZATION Left 08/21/2019   Procedure: LEFT BREAST LUMPECTOMY WITH RADIOACTIVE SEED LOCALIZATION;  Surgeon: Jovita Kussmaul, MD;  Location: [facility];  Service: General;  Laterality: Left;  . CESAREAN SECTION     x2     OB History   No obstetric history on file.     Family History  Problem Relation Age of Onset  . Aneurysm Mother   . Healthy Father     Social History   Tobacco Use  . Smoking status: Never Smoker  . Smokeless tobacco: Never Used  Vaping Use  . Vaping Use: Never used  Substance Use Topics  . Alcohol use: No  . Drug use: No    Home Medications Prior to Admission medications   Medication Sig Start Date End Date Taking? Authorizing Provider  aspirin EC 81 MG tablet Take 81 mg by mouth daily. Swallow whole.    [provider]  atorvastatin (LIPITOR) 40 MG tablet Take 1 tablet by mouth Monday Wed Friday 03/17/20  [name], MD  cephALEXin [facility]) 500 MG capsule One tab po every 12 hours 08/30/20   [name], MD  Continuous Blood Gluc Receiver (FREESTYLE LIBRE 14 DAY READER) DEVI 1 each by Does not apply route QID. 03/30/20   [name], MD  Continuous Blood Gluc Sensor (FREESTYLE LIBRE 14 DAY SENSOR) MISC Use as directed to check blood sugars 4 times per day dx: e11.65 06/08/20   [name], MD  insulin detemir (LEVEMIR FLEXTOUCH) 100 UNIT/ML FlexPen Inject 20 units subcutaneously daily at bedtime, max  dose 60 units 02/13/20   [name], MD  Insulin Pen Needle (PEN NEEDLES 3/16") 31G X 5 MM MISC Use as directed, once daily. 02/13/20   [name], MD  Olmesartan-amLODIPine-HCTZ [facility]) 40-5-25 MG TABS Take 1 tablet by mouth daily. 02/13/20   [name], MD  tamoxifen (NOLVADEX) 20 MG tablet Take 1 tablet (20 mg total) by mouth daily. 07/13/20   Nicholas Lose, MD  Vitamin D, Cholecalciferol, 50 MCG (2000 UT) CAPS Take 2,000 Units by mouth daily.     [provider]    Allergies    Patient has no known allergies.  Review of Systems   Review of Systems  Constitutional: Positive for appetite change. Negative for chills, fatigue and fever.  HENT: Negative for sore throat and trouble swallowing.   Respiratory: Negative for cough, chest tightness, shortness of breath and wheezing.   Cardiovascular: Negative for chest pain and palpitations.  Gastrointestinal: Negative for abdominal pain, nausea and vomiting.  Endocrine: Positive for polydipsia.  Genitourinary: Positive for dysuria, flank pain and frequency. Negative for hematuria.  Musculoskeletal: Negative for arthralgias, back pain, myalgias, neck pain and neck stiffness.  Skin: Negative for rash.  Neurological: Negative for dizziness, syncope, weakness, numbness and headaches.  Hematological: Does not bruise/bleed easily.  Psychiatric/Behavioral: Negative for confusion.    Physical Exam Updated Vital Signs BP 124/65 (BP Location: Right Arm)   Pulse 72   Temp 97.8 F (36.6 C) (Oral)   Resp 15   Ht 5\' 5"  (1.651 m)   Wt 74.4 kg   SpO2 98%   BMI 27.29 kg/m   Physical Exam Vitals and nursing note reviewed.  Constitutional:      General: She is not in acute distress.    Appearance: Normal appearance. She is not toxic-appearing.  HENT:     Mouth/Throat:     Comments: Mucous membranes mildly dry. Eyes:     Extraocular Movements: Extraocular movements intact.     Conjunctiva/sclera: Conjunctivae normal.      Pupils: Pupils are equal, round, and reactive to light.  Cardiovascular:     Rate and Rhythm: Normal rate and regular rhythm.     Pulses: Normal pulses.  Pulmonary:     Effort: Pulmonary effort is normal.     Breath sounds: Normal breath sounds.  Abdominal:     General: There is no distension.     Palpations: Abdomen is soft.     Tenderness: There is no abdominal tenderness. There is right CVA tenderness and left CVA tenderness. There is no guarding. Negative signs include McBurney's sign.     Comments: Mild CVA tenderness bilaterally.    Musculoskeletal:        General: Normal range of motion.     Right lower leg: No edema.     Left lower leg: No edema.  Skin:    General: Skin is warm.     Findings: No rash.  Neurological:     General:  No focal deficit present.     Mental Status: She is alert.     Sensory: Sensation is intact. No sensory deficit.     Motor: Motor function is intact. No weakness.     Coordination: Coordination is intact.     Comments: CN II-XII grossly intact.  Speech clear.   Psychiatric:        Attention and Perception: Attention normal.        Mood and Affect: Mood normal.        Speech: Speech normal.        Behavior: Behavior normal.        Cognition and Memory: Cognition normal.        Judgment: Judgment normal.     ED Results / Procedures / Treatments   Labs (all labs ordered are listed, but only abnormal results are displayed) Labs Reviewed  BASIC METABOLIC PANEL - Abnormal; Notable for the following components:      Result Value   Sodium 131 (*)    Potassium 3.0 (*)    Chloride 95 (*)    Glucose, Bld 266 (*)    BUN 48 (*)    Creatinine, Ser 2.29 (*)    GFR calc non Af Amer 21 (*)    GFR calc Af Amer 24 (*)    All other components within normal limits  URINALYSIS, ROUTINE W REFLEX MICROSCOPIC - Abnormal; Notable for the following components:   APPearance CLOUDY (*)    Glucose, UA >=500 (*)    Hgb urine dipstick SMALL (*)    Leukocytes,Ua  LARGE (*)    WBC, UA >50 (*)    Bacteria, UA MANY (*)    All other components within normal limits  CBG MONITORING, ED - Abnormal; Notable for the following components:   Glucose-Capillary 259 (*)    All other components within normal limits  URINE CULTURE  CBC WITH DIFFERENTIAL/PLATELET    EKG None  Radiology No results found.  Procedures Procedures (including critical care time)  Medications Ordered in ED Medications  cefTRIAXone (ROCEPHIN) 1 g in sodium chloride 0.9 % 100 mL IVPB (1 g Intravenous New Bag/Given (Non-Interop) 08/30/20 2319)  potassium chloride SA (KLOR-CON) CR tablet 40 mEq (40 mEq Oral Given 08/30/20 2225)  sodium chloride 0.9 % bolus 500 mL (500 mLs Intravenous New Bag/Given (Non-Interop) 08/30/20 2320)  acetaminophen (TYLENOL) tablet 650 mg (650 mg Oral Given 08/30/20 2225)    ED Course  I have reviewed the triage vital signs and the nursing notes.  Pertinent labs & imaging results that were available during my care of the patient were reviewed by me and considered in my medical decision making (see chart for details).    MDM Rules/Calculators/A&P                          On recheck, patient is resting comfortably.  Vital signs reassuring.  Her PCP has written a prescription for cephalexin. Pt advised to come to ER for IV antibiotic. She has a follow-up appointment this week.   Labs show elevated kidney function.  This is felt to be secondary to dehydration.  Mild hypokalemia as well.  Blood sugar elevated, nml anion gap and no evidence of HHS.  Pt given IV fluids and oral potassium.  Prescription has been written for potassium.  Pt advised to have her potassium level rechecked by PCP. Return precautions given.    Final Clinical Impression(s) / ED Diagnoses Final diagnoses:  Hyperglycemia  Urinary tract infection with hematuria, site unspecified  Hypokalemia    Rx / DC Orders ED Discharge Orders    None       Kem Parkinson, PA-C 09/01/20  1103    Noemi Chapel, MD 09/01/20 [phone]

## 2020-08-30 NOTE — ED Triage Notes (Signed)
Pt states she was seen at [facility] ED Friday and was told she has a UTI but was not given any antibiotics; pt states her cbg readings have been reading high

## 2020-08-30 NOTE — ED Notes (Signed)
Failed iv x3. Second rn to attempt

## 2020-08-30 NOTE — Discharge Instructions (Addendum)
Your urine test and culture showed that you have a urinary tract infection.  You have received IV antibiotics this evening and you have a prescription written by your primary care provider for cephalexin.  It is important that you take that antibiotic as directed until it is finished.  Your potassium level this evening is also low.  This can be a result of vomiting or diarrhea and also from taking diuretics.  I have prescribed a short course of potassium for you to take.  Your primary care provider will likely need to recheck your potassium level in 1 to 2 weeks.  Return to the emergency department for any worsening symptoms.

## 2020-08-31 ENCOUNTER — Telehealth: Payer: Self-pay

## 2020-08-31 DIAGNOSIS — E1165 Type 2 diabetes mellitus with hyperglycemia: Secondary | ICD-10-CM | POA: Diagnosis not present

## 2020-08-31 NOTE — Telephone Encounter (Signed)
Pt called and LVM stating she needed to discuss "what happened over the weekend." This nurse called pt and pt states she figured it out, and understands she needs to see her PCP about managing her hyperglycemia, and ask for an endocrinology referral from PCP. Pt verbalizes thanks for the call back. Pt understands to call us anytime she has questions/concerns.

## 2020-09-01 ENCOUNTER — Telehealth: Payer: Self-pay | Admitting: *Deleted

## 2020-09-01 ENCOUNTER — Ambulatory Visit: Payer: Medicare PPO

## 2020-09-01 ENCOUNTER — Other Ambulatory Visit: Payer: Self-pay

## 2020-09-01 VITALS — BP 118/76 | HR 73 | Temp 98.0°F | Ht 65.0 in | Wt 159.0 lb

## 2020-09-01 DIAGNOSIS — N183 Chronic kidney disease, stage 3 unspecified: Secondary | ICD-10-CM

## 2020-09-01 DIAGNOSIS — E1122 Type 2 diabetes mellitus with diabetic chronic kidney disease: Secondary | ICD-10-CM

## 2020-09-01 DIAGNOSIS — Z23 Encounter for immunization: Secondary | ICD-10-CM

## 2020-09-01 MED ORDER — FIASP FLEXTOUCH 100 UNIT/ML ~~LOC~~ SOPN: PEN_INJECTOR | SUBCUTANEOUS | 3 refills | Status: DC

## 2020-09-01 NOTE — Progress Notes (Signed)
Pt here today for Humalog sliding scale teaching and referral to nutritionist

## 2020-09-01 NOTE — Telephone Encounter (Signed)
Post ED Visit - Positive Culture Follow-up  Culture report reviewed by antimicrobial stewardship pharmacist: [place] Team []  Elenor Quinones, Pharm.D. []  Heide Guile, Pharm.D., BCPS AQ-ID []  Parks Neptune, Pharm.D., BCPS []  Alycia Rossetti, Pharm.D., BCPS []  [place], Pharm.D., BCPS, AAHIVP []  Legrand Como, Pharm.D., BCPS, AAHIVP []  Salome Arnt, PharmD, BCPS []  Johnnette Gourd, PharmD, BCPS []  Hughes Better, PharmD, BCPS []  Leeroy Cha, PharmD []  Laqueta Linden, PharmD, BCPS []  Albertina Parr, PharmD  [place] Team []  Leodis Sias, PharmD []  Lindell Spar, PharmD []  Royetta Asal, PharmD []  Graylin Shiver, Rph []  Rema Fendt) Glennon Mac, PharmD []  Arlyn Dunning, PharmD []  Netta Cedars, PharmD []  Dia Sitter, PharmD []  Leone Haven, PharmD []  Gretta Arab, PharmD []  Theodis Shove, PharmD []  Peggyann Juba, PharmD []  Reuel Boom, PharmD   Positive urine culture Treated with Keflex by PCP, organism sensitive to the same and no further patient follow-up is required at this time.  Dimple Nanas, PharmD   Harlon Flor Talley 09/01/2020, 9:49 AM

## 2020-09-02 ENCOUNTER — Telehealth: Payer: Self-pay

## 2020-09-02 LAB — URINE CULTURE: Culture: >=100000 — AB

## 2020-09-02 NOTE — Telephone Encounter (Signed)
The pt said that her blood sugars was 116 this morning and she wanted to make sure that she was looking at her sliding scale insulin right that if it's below 150 not to take any insulin.  The pt was told that she is correct.

## 2020-09-03 ENCOUNTER — Other Ambulatory Visit: Payer: Self-pay

## 2020-09-03 ENCOUNTER — Telehealth: Payer: Self-pay | Admitting: *Deleted

## 2020-09-03 MED ORDER — FIASP FLEXTOUCH 100 UNIT/ML ~~LOC~~ SOPN: PEN_INJECTOR | SUBCUTANEOUS | 3 refills | Status: DC

## 2020-09-03 NOTE — Telephone Encounter (Signed)
Post ED Visit - Positive Culture Follow-up  Culture report reviewed by antimicrobial stewardship pharmacist: [facility] Team []  Nathan Batchelder, Pharm.D. []  [address], Pharm.D., BCPS AQ-ID []  Heide Guile, Pharm.D., BCPS []  Parks Neptune, Pharm.D., BCPS []  [place], Pharm.D., BCPS, AAHIVP []  [place], Pharm.D., BCPS, AAHIVP []  Legrand Como, PharmD, BCPS []  Salome Arnt, PharmD, BCPS []  Johnnette Gourd, PharmD, BCPS []  Hughes Better, PharmD []  Leeroy Cha, PharmD, BCPS []  Laqueta Linden, PharmD  [facility] Team []  [address], PharmD []  Leodis Sias, PharmD []  Lindell Spar, PharmD []  Royetta Asal, Rph []  Graylin Shiver) Rema Fendt, PharmD []  Glennon Mac, PharmD []  Arlyn Dunning, PharmD []  Netta Cedars, PharmD []  Dia Sitter, PharmD []  Leone Haven, PharmD []  Gretta Arab, PharmD []  Theodis Shove, PharmD []  Peggyann Juba, PharmD   Positive urine culture Treated with Cephalexin, organism sensitive to the same and no further patient follow-up is required at this time.Reuel Boom, PharmD  Dimple Nanas Talley 09/03/2020, 9:56 AM

## 2020-09-08 ENCOUNTER — Other Ambulatory Visit: Payer: Self-pay

## 2020-09-08 ENCOUNTER — Ambulatory Visit (INDEPENDENT_AMBULATORY_CARE_PROVIDER_SITE_OTHER): Payer: Medicare PPO | Admitting: Internal Medicine

## 2020-09-08 ENCOUNTER — Encounter: Payer: Self-pay | Admitting: Internal Medicine

## 2020-09-08 VITALS — BP 126/84 | HR 63 | Temp 98.3°F | Ht 65.0 in | Wt 161.0 lb

## 2020-09-08 DIAGNOSIS — Z23 Encounter for immunization: Secondary | ICD-10-CM

## 2020-09-08 DIAGNOSIS — N3001 Acute cystitis with hematuria: Secondary | ICD-10-CM

## 2020-09-08 DIAGNOSIS — E1122 Type 2 diabetes mellitus with diabetic chronic kidney disease: Secondary | ICD-10-CM

## 2020-09-08 DIAGNOSIS — N183 Chronic kidney disease, stage 3 unspecified: Secondary | ICD-10-CM | POA: Diagnosis not present

## 2020-09-08 DIAGNOSIS — E876 Hypokalemia: Secondary | ICD-10-CM

## 2020-09-08 LAB — POCT URINALYSIS DIPSTICK
Bilirubin, UA: NEGATIVE
Glucose, UA: NEGATIVE
Ketones, UA: NEGATIVE
Nitrite, UA: NEGATIVE
Protein, UA: NEGATIVE
Spec Grav, UA: 1.02 (ref 1.010–1.025)
Urobilinogen, UA: 0.2 E.U./dL
pH, UA: 5.5 (ref 5.0–8.0)

## 2020-09-08 NOTE — Patient Instructions (Signed)
Diabetes Mellitus and Foot Care Foot care is an important part of your health, especially when you have diabetes. Diabetes may cause you to have problems because of poor blood flow (circulation) to your feet and legs, which can cause your skin to:  Become thinner and drier.  Break more easily.  Heal more slowly.  Peel and crack. You may also have nerve damage (neuropathy) in your legs and feet, causing decreased feeling in them. This means that you may not notice minor injuries to your feet that could lead to more serious problems. Noticing and addressing any potential problems early is the best way to prevent future foot problems. How to care for your feet Foot hygiene  Wash your feet daily with warm water and mild soap. Do not use hot water. Then, pat your feet and the areas between your toes until they are completely dry. Do not soak your feet as this can dry your skin.  Trim your toenails straight across. Do not dig under them or around the cuticle. File the edges of your nails with an emery board or nail file.  Apply a moisturizing lotion or petroleum jelly to the skin on your feet and to dry, brittle toenails. Use lotion that does not contain alcohol and is unscented. Do not apply lotion between your toes. Shoes and socks  Wear clean socks or stockings every day. Make sure they are not too tight. Do not wear knee-high stockings since they may decrease blood flow to your legs.  Wear shoes that fit properly and have enough cushioning. Always look in your shoes before you put them on to be sure there are no objects inside.  To break in new shoes, wear them for just a few hours a day. This prevents injuries on your feet. Wounds, scrapes, corns, and calluses  Check your feet daily for blisters, cuts, bruises, sores, and redness. If you cannot see the bottom of your feet, use a mirror or ask someone for help.  Do not cut corns or calluses or try to remove them with medicine.  If you  find a minor scrape, cut, or break in the skin on your feet, keep it and the skin around it clean and dry. You may clean these areas with mild soap and water. Do not clean the area with peroxide, alcohol, or iodine.  If you have a wound, scrape, corn, or callus on your foot, look at it several times a day to make sure it is healing and not infected. Check for: ? Redness, swelling, or pain. ? Fluid or blood. ? Warmth. ? Pus or a bad smell. General instructions  Do not cross your legs. This may decrease blood flow to your feet.  Do not use heating pads or hot water bottles on your feet. They may burn your skin. If you have lost feeling in your feet or legs, you may not know this is happening until it is too late.  Protect your feet from hot and cold by wearing shoes, such as at the beach or on hot pavement.  Schedule a complete foot exam at least once a year (annually) or more often if you have foot problems. If you have foot problems, report any cuts, sores, or bruises to your health care provider immediately. Contact a health care provider if:  You have a medical condition that increases your risk of infection and you have any cuts, sores, or bruises on your feet.  You have an injury that is not   healing.  You have redness on your legs or feet.  You feel burning or tingling in your legs or feet.  You have pain or cramps in your legs and feet.  Your legs or feet are numb.  Your feet always feel cold.  You have pain around a toenail. Get help right away if:  You have a wound, scrape, corn, or callus on your foot and: ? You have pain, swelling, or redness that gets worse. ? You have fluid or blood coming from the wound, scrape, corn, or callus. ? Your wound, scrape, corn, or callus feels warm to the touch. ? You have pus or a bad smell coming from the wound, scrape, corn, or callus. ? You have a fever. ? You have a red line going up your leg. Summary  Check your feet every day  for cuts, sores, red spots, swelling, and blisters.  Moisturize feet and legs daily.  Wear shoes that fit properly and have enough cushioning.  If you have foot problems, report any cuts, sores, or bruises to your health care provider immediately.  Schedule a complete foot exam at least once a year (annually) or more often if you have foot problems. This information is not intended to replace advice given to you by your health care provider. Make sure you discuss any questions you have with your health care provider. Document Revised: 08/07/2019 Document Reviewed: 12/16/2016 Elsevier Patient Education  2020 Elsevier Inc.  

## 2020-09-08 NOTE — Progress Notes (Signed)
I,[name],acting as a Education administrator for [name], MD.,have documented all relevant documentation on the behalf of [name], MD,as directed by  [name], MD while in the presence of [name], MD.  This visit occurred during the SARS-CoV-2 public health emergency.  Safety protocols were in place, including screening questions prior to the visit, additional usage of staff PPE, and extensive cleaning of exam room while observing appropriate contact time as indicated for disinfecting solutions.  Subjective:     Patient ID: Michele Wang , female    DOB: 1951/10/30 , 69 y.o.   MRN: [id number]   Chief Complaint  Patient presents with  . Diabetes    HPI  She is here today for DM check. She is here today to f/u recent episodes of hyperglycemia. She presented to [facility] ER on 9/30 and [facility] on 10/3 for further evaluation of hyperglycemia. Her initial evaluation revealed she had UTI. She was not given any abx during this visit, so I sent rx cephalexin to her pharmacy. Unfortunately, her sugars remained elevated due to UTI - so she went to ER again on 10/3 for evaluation of hyperglycemia. She was given iv fluids and iv Rocephin. She is feeling much better now   and her sugars have improved. She was started on sliding scale insulin last week, states she usually only needs it at lunch/dinner. Has not needed to use it in the morning.   She reports she is now committed to eating better so she can have better control of her blood sugars. S he is still using 22 units of basal insulin.  She is no longer having any dysuria, but she is still having some left-sided flank pain.     Diabetes She presents for her follow-up diabetic visit. She has type 2 diabetes mellitus. There are no hypoglycemic associated symptoms. There are no diabetic associated symptoms. There are no hypoglycemic complications. Diabetic complications include nephropathy. She is following a diabetic diet. She  participates in exercise intermittently.     Past Medical History:  Diagnosis Date  . Breast cancer ([place])   . CKD (chronic kidney disease)   . Diabetes mellitus without complication ([place])    Type II  . Hyperlipidemia   . Hypertension   . UTI (urinary tract infection)      Family History  Problem Relation Age of Onset  . Aneurysm Mother   . Healthy Father      Current Outpatient Medications:  .  aspirin EC 81 MG tablet, Take 81 mg by mouth daily. Swallow whole., Disp: , Rfl:  .  atorvastatin (LIPITOR) 40 MG tablet, Take 1 tablet by mouth Monday Wed Friday, Disp: 90 tablet, Rfl: 2 .  Continuous Blood Gluc Receiver (FREESTYLE LIBRE 14 DAY READER) DEVI, 1 each by Does not apply route QID., Disp: 2 each, Rfl: 2 .  Continuous Blood Gluc Sensor (FREESTYLE LIBRE 14 DAY SENSOR) MISC, Use as directed to check blood sugars 4 times per day dx: e11.65, Disp: 2 each, Rfl: 2 .  insulin aspart (FIASP FLEXTOUCH) 100 UNIT/ML FlexTouch Pen, INJECT INTO SKIN SUBCUTANEOUS BEFORE MEALS PER SLIDING SCALE, Disp: 6 mL, Rfl: 3 .  insulin detemir (LEVEMIR FLEXTOUCH) 100 UNIT/ML FlexPen, Inject 20 units subcutaneously daily at bedtime, max dose 60 units (Patient taking differently: 22 Units. Inject 20 units subcutaneously daily at bedtime, max dose 60 units), Disp: 15 pen, Rfl: 2 .  Insulin Pen Needle (PEN NEEDLES 3/16") 31G X 5 MM MISC,  Use as directed, once daily., Disp: 90 each, Rfl: 2 .  Olmesartan-amLODIPine-HCTZ (TRIBENZOR) 40-5-25 MG TABS, Take 1 tablet by mouth daily., Disp: 90 tablet, Rfl: 2 .  tamoxifen (NOLVADEX) 20 MG tablet, Take 1 tablet (20 mg total) by mouth daily., Disp: 90 tablet, Rfl: 3 .  Vitamin D, Cholecalciferol, 50 MCG (2000 UT) CAPS, Take 2,000 Units by mouth daily. , Disp: , Rfl:  .  potassium chloride SA (KLOR-CON) 20 MEQ tablet, Take 1 tablet (20 mEq total) by mouth 2 (two) times daily. (Patient not taking: Reported on 09/08/2020), Disp: 10 tablet, Rfl: 0   No Known Allergies    Review of Systems  Constitutional: Negative.   Respiratory: Negative.   Cardiovascular: Negative.   Gastrointestinal: Negative.   Psychiatric/Behavioral: Negative.   All other systems reviewed and are negative.    Today's Vitals   09/08/20 1057  BP: 126/84  Pulse: 63  Temp: 98.3 F (36.8 C)  TempSrc: Oral  Weight: 161 lb (73 kg)  Height: _0  (1.651 m)  PainSc: 0-No pain   Body mass index is 26.79 kg/m.   Objective:  Physical Exam Vitals and nursing note reviewed.  Constitutional:      Appearance: Normal appearance. She is obese.  HENT:     Head: Normocephalic and atraumatic.  Cardiovascular:     Rate and Rhythm: Normal rate and regular rhythm.     Heart sounds: Normal heart sounds.  Pulmonary:     Breath sounds: Normal breath sounds.  Skin:    General: Skin is warm.  Neurological:     General: No focal deficit present.     Mental Status: She is alert and oriented to person, place, and time.         Assessment And Plan:     1. Type 2 diabetes mellitus with stage 3 chronic kidney disease, without long-term current use of insulin, unspecified whether stage 3a or 3b CKD ([place]) Comments: We reviewed her sliding scale in full detail. I will check labs as listed below and make recommendations once her labs are avail for review. As of now, she agrees to rto in 3 months for re-evaluation. I will see her sooner if additional meds are added to her current regimen. We did discuss use of GLP-1 agents to help with improved postprandial blood sugars.   - BMP8+EGFR - Hemoglobin A1c  2. Acute cystitis with hematuria Comments: ER records reviewed in full detail during her visit. I will check repeat urinalysis today. She reports completion of full abx course. Reminded to wipe from front to back. Blood found in urine today, will consider renal ultrasound.   - POCT Urinalysis Dipstick (81002)  3. Hypokalemia Comments: I will recheck potassium level today.Encouraged to  increase her intake of potassium rich foods.  - BMP8+EGFR  4. Need for vaccination Comments: She was given high dose flu vaccine.  - Flu Vaccine QUAD High Dose(Fluad)     Patient was given opportunity to ask questions. Patient verbalized understanding of the plan and was able to repeat key elements of the plan. All questions were answered to their satisfaction.  [name], MD   I, [name], MD, have reviewed all documentation for this visit. The documentation on 09/08/20 for the exam, diagnosis, procedures, and orders are all accurate and complete.  THE PATIENT IS ENCOURAGED TO PRACTICE SOCIAL DISTANCING DUE TO THE COVID-19 PANDEMIC.

## 2020-09-09 LAB — HEMOGLOBIN A1C
Est. average glucose Bld gHb Est-mCnc: 269 mg/dL
Hgb A1c MFr Bld: 11 % — ABNORMAL HIGH (ref 4.8–5.6)

## 2020-09-09 LAB — BMP8+EGFR
BUN/Creatinine Ratio: 17 (ref 12–28)
BUN: 35 mg/dL — ABNORMAL HIGH (ref 8–27)
CO2: 26 mmol/L (ref 20–29)
Calcium: 10.3 mg/dL (ref 8.7–10.3)
Chloride: 100 mmol/L (ref 96–106)
Creatinine, Ser: 2.07 mg/dL — ABNORMAL HIGH (ref 0.57–1.00)
GFR calc Af Amer: 28 mL/min/{1.73_m2} — ABNORMAL LOW (ref >59)
GFR calc non Af Amer: 24 mL/min/{1.73_m2} — ABNORMAL LOW (ref >59)
Glucose: 154 mg/dL — ABNORMAL HIGH (ref 65–99)
Potassium: 3.8 mmol/L (ref 3.5–5.2)
Sodium: 139 mmol/L (ref 134–144)

## 2020-10-12 ENCOUNTER — Other Ambulatory Visit: Payer: Self-pay

## 2020-10-12 ENCOUNTER — Encounter: Payer: Self-pay | Admitting: Dietician

## 2020-10-12 ENCOUNTER — Encounter: Payer: Medicare PPO | Attending: Internal Medicine | Admitting: Dietician

## 2020-10-12 DIAGNOSIS — E0822 Diabetes mellitus due to underlying condition with diabetic chronic kidney disease: Secondary | ICD-10-CM | POA: Insufficient documentation

## 2020-10-12 DIAGNOSIS — N184 Chronic kidney disease, stage 4 (severe): Secondary | ICD-10-CM | POA: Insufficient documentation

## 2020-10-12 DIAGNOSIS — Z794 Long term (current) use of insulin: Secondary | ICD-10-CM | POA: Diagnosis not present

## 2020-10-12 NOTE — Progress Notes (Signed)
Diabetes Self-Management Education  Visit Type:  Follow-up  Appt. Start Time: 1455 Appt. End Time: 1600  10/12/2020  [name], identified by name and date of birth, is a 69 y.o. female with a diagnosis of Diabetes:  .   ASSESSMENT Patient is here today alone.  She was last seen 02/14/2020.  She would like to learn more about how to eat with diabetes.  She would like to learn more meal ideas.  History includes type 2 diabetes for the past 12-15 years and reports that it was well controlled until recently. Other history includes:  HTN, HLD, CKD stage 3,  vitamin D deficiency, ductal breast cancer 9/20 and finished radiation in 11/20. She states that she has had more recent UTI's and uncontrolled blood sugar as a result leading to a couple of ER visits 08/2020. Labs noted 09/08/2020:  BUN 35, Creatinine 2.07, Potassium 3.8, eGFR 24, A1C 11% (increased from 3.3% 07/13/2020). Patient states that she has seen a nephrologist in the past.  Medications include:  Levemir 22 units q HS, Fiasp (sliding scale using rarely) Fasting Blood glucose 88-107.  She use the FreeStyle Libre at times but insurance does not cover it.  She uses for convenience and to have more information.  Weight 159 lbs at MD appointment 02/13/20 and lost 10 lbs since her cancer dx in September 2020.  She attriutes her weight loss to a number of factors (cancer, stress, radiation, uncontrolled diabetes). She states that she gained to 164 lbs and has since lost to 156 lbs 10/12/2020.  She would like to lose another 10 lbs.  Discussed that weight loss may be a result of out of control blood sugars, recent illness, decreased appetite.  Patient lives with her husband and son.  They all share shopping and cooking but patient states ths does most.  She is retired from [employer]. Was going to [employer] but increased issues with her family and she has not gotten there recently.  She does plan on going back soon. Avoids most  salt, occasionally uses a seasoning blend. Height 5' 4" (1.626 m), weight 156 lb (70.8 kg). Body mass index is 26.78 kg/m.    Diabetes Self-Management Education - 10/12/20 1640      Psychosocial Assessment   Patient Belief/Attitude about Diabetes Motivated to manage diabetes    Self-care barriers None    Self-management support Doctor's office    Patient Concerns Nutrition/Meal planning      Pre-Education Assessment   Patient understands the diabetes disease and treatment process. Demonstrates understanding / competency    Patient understands incorporating nutritional management into lifestyle. Needs Review    Patient undertands incorporating physical activity into lifestyle. Demonstrates understanding / competency    Patient understands using medications safely. Needs Review    Patient understands monitoring blood glucose, interpreting and using results Demonstrates understanding / competency    Patient understands prevention, detection, and treatment of acute complications. Demonstrates understanding / competency    Patient understands prevention, detection, and treatment of chronic complications. Demonstrates understanding / competency    Patient understands how to develop strategies to address psychosocial issues. Demonstrates understanding / competency    Patient understands how to develop strategies to promote health/change behavior. Needs Review      Complications   Last HgB A1C per patient/outside source 11 %   09/08/2020 increased from 8.8%   How often do you check your blood sugar? 1-2 times/day    Fasting Blood glucose range (mg/dL) 70-129  Postprandial Blood glucose range (mg/dL) 130-179   120-130   Number of hypoglycemic episodes per month 0      Dietary Intake   Breakfast oatmeal, coffee, occasional scrambled egg with colby cheese    Lunch salad, berries, nuts, cucumbers, occasional cheese    Snack (afternoon) occasional fruit    Dinner Salmon, spinach, salad, baked  sweet potato    Beverage(s) water, water with lemon, coffee with splenda and 2% milk      Exercise   Exercise Type Light (walking / raking leaves)    How many days per week to you exercise? 2    How many minutes per day do you exercise? 60    Total minutes per week of exercise 120      Patient Education   Previous Diabetes Education Yes (please comment)   01/2020   Nutrition management  Meal options for control of blood glucose level and chronic complications.    Physical activity and exercise  Role of exercise on diabetes management, blood pressure control and cardiac health.;Helped patient identify appropriate exercises in relation to his/her diabetes, diabetes complications and other health issue.    Medications Taught/reviewed insulin injection, site rotation, insulin storage and needle disposal.;Reviewed patients medication for diabetes, action, purpose, timing of dose and side effects.    Monitoring Identified appropriate SMBG and/or A1C goals.      Individualized Goals (developed by patient)   Nutrition General guidelines for healthy choices and portions discussed;Other (comment)   kidney/DM diet low in sodium   Physical Activity Exercise 5-7 days per week;30 minutes per day    Medications take my medication as prescribed    Monitoring  test my blood glucose as discussed    Reducing Risk examine blood glucose patterns;increase portions of healthy fats;Other (comment)   low sodium, low protein     Patient Self-Evaluation of Goals - Patient rates self as meeting previously set goals (% of time)   Nutrition >75%    Physical Activity >75%    Medications >75%    Monitoring >75%    Problem Solving >75%    Reducing Risk >75%    Health Coping >75%      Post-Education Assessment   Patient understands the diabetes disease and treatment process. Demonstrates understanding / competency    Patient understands incorporating nutritional management into lifestyle. Demonstrates understanding /  competency    Patient undertands incorporating physical activity into lifestyle. Demonstrates understanding / competency    Patient understands using medications safely. Demonstrates understanding / competency    Patient understands monitoring blood glucose, interpreting and using results Demonstrates understanding / competency    Patient understands prevention, detection, and treatment of acute complications. Demonstrates understanding / competency    Patient understands prevention, detection, and treatment of chronic complications. Demonstrates understanding / competency    Patient understands how to develop strategies to address psychosocial issues. Demonstrates understanding / competency    Patient understands how to develop strategies to promote health/change behavior. Demonstrates understanding / competency      Outcomes   Program Status Completed      Subsequent Visit   Since your last visit have you continued or begun to take your medications as prescribed? Yes    Since your last visit have you experienced any weight changes? Loss    Weight Loss (lbs) 3    Since your last visit, are you checking your blood glucose at least once a day? Yes  Learning Objective:  Patient will have a greater understanding of diabetes self-management. Patient education plan is to attend individual and/or group sessions per assessed needs and concerns.   Plan:   Patient Instructions  Discuss your cholesterol medication with your doctor. Consider seeing your kidney doctor.   Aim to be active most days.             Ask your friend about the Zoom exercise class             Look for a video on you tube such as walk away the pounds, sit and be fit or zumba              Walk  Great job on changing to water!! Consider True Lemon packets in water for variety if you desire or diet Cranberry Juice Be sure that you are staying hydrated Be mindful about salt/sodium.   Continue to be mindful  with your meals Choose baked rather than fried most often Continue to find ways to add vegetables to your meals. Small portions of lean meat.  Fish is a great choice.  Consider options for meatless meals such as salad, soup, vegetable plates.    Expected Outcomes:  Demonstrated interest in learning. Expect positive outcomes  Education material provided: Meal plan card, Snack sheet and Diabetes Resources, NKD National Kidney Diet dish up a kidney friendly meal for those not on dialysis  If problems or questions, patient to contact team via:  Phone  Future DSME appointment: - PRN

## 2020-10-12 NOTE — Patient Instructions (Addendum)
Discuss your cholesterol medication with your doctor. Consider seeing your kidney doctor.   Aim to be active most days.             Ask your friend about the Zoom exercise class             Look for a video on you tube such as walk away the pounds, sit and be fit or zumba              Walk  Great job on changing to water!! Consider True Lemon packets in water for variety if you desire or diet Cranberry Juice Be sure that you are staying hydrated Be mindful about salt/sodium.   Continue to be mindful with your meals Choose baked rather than fried most often Continue to find ways to add vegetables to your meals. Small portions of lean meat.  Fish is a great choice.  Consider options for meatless meals such as salad, soup, vegetable plates.

## 2020-11-11 ENCOUNTER — Ambulatory Visit (INDEPENDENT_AMBULATORY_CARE_PROVIDER_SITE_OTHER): Payer: Medicare PPO | Admitting: Internal Medicine

## 2020-11-11 ENCOUNTER — Encounter: Payer: Self-pay | Admitting: Internal Medicine

## 2020-11-11 ENCOUNTER — Other Ambulatory Visit: Payer: Self-pay

## 2020-11-11 VITALS — BP 116/82 | HR 81 | Temp 98.4°F | Ht 64.0 in | Wt 158.4 lb

## 2020-11-11 DIAGNOSIS — I700 Atherosclerosis of aorta: Secondary | ICD-10-CM

## 2020-11-11 DIAGNOSIS — E1122 Type 2 diabetes mellitus with diabetic chronic kidney disease: Secondary | ICD-10-CM | POA: Diagnosis not present

## 2020-11-11 DIAGNOSIS — Z794 Long term (current) use of insulin: Secondary | ICD-10-CM | POA: Diagnosis not present

## 2020-11-11 DIAGNOSIS — N390 Urinary tract infection, site not specified: Secondary | ICD-10-CM

## 2020-11-11 DIAGNOSIS — E663 Overweight: Secondary | ICD-10-CM

## 2020-11-11 DIAGNOSIS — R8290 Unspecified abnormal findings in urine: Secondary | ICD-10-CM

## 2020-11-11 DIAGNOSIS — I129 Hypertensive chronic kidney disease with stage 1 through stage 4 chronic kidney disease, or unspecified chronic kidney disease: Secondary | ICD-10-CM | POA: Diagnosis not present

## 2020-11-11 DIAGNOSIS — N1832 Chronic kidney disease, stage 3b: Secondary | ICD-10-CM | POA: Diagnosis not present

## 2020-11-11 LAB — POCT URINALYSIS DIPSTICK
Bilirubin, UA: NEGATIVE
Blood, UA: NEGATIVE
Glucose, UA: NEGATIVE
Ketones, UA: NEGATIVE
Nitrite, UA: POSITIVE
Protein, UA: NEGATIVE
Spec Grav, UA: 1.02 (ref 1.010–1.025)
Urobilinogen, UA: 0.2 E.U./dL
pH, UA: 6 (ref 5.0–8.0)

## 2020-11-11 NOTE — Progress Notes (Signed)
I,[name],acting as a Education administrator for [name], MD.,have documented all relevant documentation on the behalf of [name], MD,as directed by  [name], MD while in the presence of [name], MD.  This visit occurred during the SARS-CoV-2 public health emergency.  Safety protocols were in place, including screening questions prior to the visit, additional usage of staff PPE, and extensive cleaning of exam room while observing appropriate contact time as indicated for disinfecting solutions.  Subjective:     Patient ID: Michele Wang , female    DOB: [birth year] , 69 y.o.   MRN: [id number]   Chief Complaint  Patient presents with  . Hypertension    HPI  The patient is here today for a follow-up on her blood pressure.  She reports compliance with meds. She denies headaches, chest pain and shortness of breath.   Hypertension This is a chronic problem. The current episode started more than 1 year ago. The problem has been gradually improving since onset. The problem is controlled. Pertinent negatives include no blurred vision, chest pain or shortness of breath. Past treatments include angiotensin blockers, diuretics and calcium channel blockers. The current treatment provides moderate improvement.  Diabetes She presents for her follow-up diabetic visit. She has type 2 diabetes mellitus. There are no hypoglycemic associated symptoms. There are no diabetic associated symptoms. Pertinent negatives for diabetes include no blurred vision and no chest pain. There are no hypoglycemic complications. Diabetic complications include nephropathy. She is following a diabetic diet. She participates in exercise intermittently.     Past Medical History:  Diagnosis Date  . Breast cancer ([place])   . CKD (chronic kidney disease)   . Diabetes mellitus without complication ([place])    Type II  . Hyperlipidemia   . Hypertension   . UTI (urinary tract infection)      Family History   Problem Relation Age of Onset  . Aneurysm Mother   . Healthy Father      Current Outpatient Medications:  .  aspirin EC 81 MG tablet, Take 81 mg by mouth daily. Swallow whole., Disp: , Rfl:  .  atorvastatin (LIPITOR) 40 MG tablet, Take 1 tablet by mouth Monday Wed Friday, Disp: 90 tablet, Rfl: 2 .  Continuous Blood Gluc Receiver (FREESTYLE LIBRE 14 DAY READER) DEVI, 1 each by Does not apply route QID., Disp: 2 each, Rfl: 2 .  Continuous Blood Gluc Sensor (FREESTYLE LIBRE 14 DAY SENSOR) MISC, Use as directed to check blood sugars 4 times per day dx: e11.65, Disp: 2 each, Rfl: 2 .  insulin detemir (LEVEMIR FLEXTOUCH) 100 UNIT/ML FlexPen, Inject 20 units subcutaneously daily at bedtime, max dose 60 units (Patient taking differently: 22 Units. Inject 20 units subcutaneously daily at bedtime, max dose 60 units), Disp: 15 pen, Rfl: 2 .  Insulin Pen Needle (PEN NEEDLES 3/16") 31G X 5 MM MISC, Use as directed, once daily., Disp: 90 each, Rfl: 2 .  Olmesartan-amLODIPine-HCTZ (TRIBENZOR) 40-5-25 MG TABS, Take 1 tablet by mouth daily., Disp: 90 tablet, Rfl: 2 .  tamoxifen (NOLVADEX) 20 MG tablet, Take 1 tablet (20 mg total) by mouth daily., Disp: 90 tablet, Rfl: 3 .  Vitamin D, Cholecalciferol, 50 MCG (2000 UT) CAPS, Take 2,000 Units by mouth daily. , Disp: , Rfl:  .  cephALEXin (KEFLEX) 500 MG capsule, Take 1 capsule (500 mg total) by mouth 2 (two) times daily., Disp: 14 capsule, Rfl: 0 .  insulin aspart (FIASP FLEXTOUCH) 100 UNIT/ML FlexTouch Pen, INJECT  INTO SKIN SUBCUTANEOUS BEFORE MEALS PER SLIDING SCALE (Patient not taking: Reported on 11/11/2020), Disp: 6 mL, Rfl: 3 .  potassium chloride SA (KLOR-CON) 20 MEQ tablet, Take 1 tablet (20 mEq total) by mouth 2 (two) times daily. (Patient not taking: No sig reported), Disp: 10 tablet, Rfl: 0   No Known Allergies   Review of Systems  Constitutional: Negative.   Eyes: Negative for blurred vision.  Respiratory: Negative.  Negative for shortness of  breath.   Cardiovascular: Negative.  Negative for chest pain.  Gastrointestinal: Negative.   Genitourinary:       She c/o foul-smelling urine. Denies vag d/c and dysuria.   Psychiatric/Behavioral: Negative.   All other systems reviewed and are negative.    Today's Vitals   11/11/20 0850  BP: 116/82  Pulse: 81  Temp: 98.4 F (36.9 C)  TempSrc: Oral  Weight: 158 lb 6.4 oz (71.8 kg)  Height: 5\' 4"  (1.626 m)  PainSc: 0-No pain   Body mass index is 27.19 kg/m.  Wt Readings from Last 3 Encounters:  11/11/20 158 lb 6.4 oz (71.8 kg)  10/12/20 156 lb (70.8 kg)  09/08/20 161 lb (73 kg)   Objective:  Physical Exam Vitals and nursing note reviewed.  Constitutional:      Appearance: Normal appearance.  HENT:     Head: Normocephalic and atraumatic.  Cardiovascular:     Rate and Rhythm: Normal rate and regular rhythm.     Heart sounds: Normal heart sounds.  Pulmonary:     Breath sounds: Normal breath sounds.  Skin:    General: Skin is warm.  Neurological:     General: No focal deficit present.     Mental Status: She is alert and oriented to person, place, and time.         Assessment And Plan:     1. Parenchymal renal hypertension, stage 1 through stage 4 or unspecified chronic kidney disease Comments: Chronic, well controlled. She will continue with current meds. Encouraged to avoid adding salt to her foods. She is now followed by Renal.   2. Type 2 diabetes mellitus with stage 3b chronic kidney disease, with long-term current use of insulin (HCC) Comments: Chronic, I will check an a1c today. she i snecouraged to avoid sugary beverages, including diet drinks. - Hemoglobin A1c  3. Aortic atherosclerosis ([place]) Comments: Chronic, encouraged to follow heart healthy lifestyle. Importance of statin compliance was discussed with the patient in detail.   4. Malodorous urine Comments: I will check urinalysis. She denies vaginal discharge.  - POCT Urinalysis Dipstick  (81002)  5. Urinary tract infection without hematuria, site unspecified Comments: Recurrent. I will check urine culture. Will consider Urology evaluation.  - Culture, Urine  6. Overweight (BMI 25.0-29.9) Comments: BMI acceptable for her demographic. Encouraged to incorporate more exercise into her daily routine.   Patient was given opportunity to ask questions. Patient verbalized understanding of the plan and was able to repeat key elements of the plan. All questions were answered to their satisfaction.  [name], MD   I, [name], MD, have reviewed all documentation for this visit. The documentation on 11/28/20 for the exam, diagnosis, procedures, and orders are all accurate and complete.  THE PATIENT IS ENCOURAGED TO PRACTICE SOCIAL DISTANCING DUE TO THE COVID-19 PANDEMIC.

## 2020-11-11 NOTE — Patient Instructions (Signed)

## 2020-11-12 ENCOUNTER — Other Ambulatory Visit: Payer: Self-pay | Admitting: Internal Medicine

## 2020-11-12 LAB — HEMOGLOBIN A1C
Est. average glucose Bld gHb Est-mCnc: 186 mg/dL
Hgb A1c MFr Bld: 8.1 % — ABNORMAL HIGH (ref 4.8–5.6)

## 2020-11-12 MED ORDER — CEPHALEXIN 500 MG PO CAPS: 500.0000 mg | ORAL_CAPSULE | Freq: Two times a day (BID) | ORAL | 0 refills | Status: DC

## 2020-11-18 LAB — URINE CULTURE

## 2020-11-28 DIAGNOSIS — E663 Overweight: Secondary | ICD-10-CM | POA: Insufficient documentation

## 2020-11-28 DIAGNOSIS — I700 Atherosclerosis of aorta: Secondary | ICD-10-CM | POA: Insufficient documentation

## 2020-12-01 ENCOUNTER — Other Ambulatory Visit: Payer: Self-pay

## 2020-12-01 DIAGNOSIS — N390 Urinary tract infection, site not specified: Secondary | ICD-10-CM

## 2020-12-17 ENCOUNTER — Other Ambulatory Visit: Payer: Self-pay | Admitting: Internal Medicine

## 2020-12-22 ENCOUNTER — Other Ambulatory Visit: Payer: Self-pay | Admitting: Internal Medicine

## 2020-12-29 DIAGNOSIS — N390 Urinary tract infection, site not specified: Secondary | ICD-10-CM | POA: Diagnosis not present

## 2020-12-29 DIAGNOSIS — R350 Frequency of micturition: Secondary | ICD-10-CM | POA: Diagnosis not present

## 2020-12-29 DIAGNOSIS — N393 Stress incontinence (female) (male): Secondary | ICD-10-CM | POA: Diagnosis not present

## 2021-01-05 DIAGNOSIS — N390 Urinary tract infection, site not specified: Secondary | ICD-10-CM | POA: Diagnosis not present

## 2021-01-05 DIAGNOSIS — B9620 Unspecified Escherichia coli [E. coli] as the cause of diseases classified elsewhere: Secondary | ICD-10-CM | POA: Diagnosis not present

## 2021-01-11 ENCOUNTER — Encounter: Payer: Self-pay | Admitting: Internal Medicine

## 2021-01-11 ENCOUNTER — Ambulatory Visit (INDEPENDENT_AMBULATORY_CARE_PROVIDER_SITE_OTHER): Payer: Medicare PPO | Admitting: Internal Medicine

## 2021-01-11 ENCOUNTER — Other Ambulatory Visit: Payer: Self-pay

## 2021-01-11 VITALS — BP 126/82 | HR 71 | Temp 98.2°F | Ht 64.0 in | Wt 165.0 lb

## 2021-01-11 DIAGNOSIS — N1832 Chronic kidney disease, stage 3b: Secondary | ICD-10-CM

## 2021-01-11 DIAGNOSIS — E663 Overweight: Secondary | ICD-10-CM | POA: Diagnosis not present

## 2021-01-11 DIAGNOSIS — E1122 Type 2 diabetes mellitus with diabetic chronic kidney disease: Secondary | ICD-10-CM | POA: Diagnosis not present

## 2021-01-11 DIAGNOSIS — I129 Hypertensive chronic kidney disease with stage 1 through stage 4 chronic kidney disease, or unspecified chronic kidney disease: Secondary | ICD-10-CM

## 2021-01-11 DIAGNOSIS — Z794 Long term (current) use of insulin: Secondary | ICD-10-CM

## 2021-01-11 DIAGNOSIS — I700 Atherosclerosis of aorta: Secondary | ICD-10-CM | POA: Diagnosis not present

## 2021-01-11 DIAGNOSIS — C50412 Malignant neoplasm of upper-outer quadrant of left female breast: Secondary | ICD-10-CM

## 2021-01-11 DIAGNOSIS — Z170 Estrogen receptor positive status [ER+]: Secondary | ICD-10-CM

## 2021-01-11 NOTE — Patient Instructions (Signed)
Diabetes Mellitus and Foot Care Foot care is an important part of your health, especially when you have diabetes. Diabetes may cause you to have problems because of poor blood flow (circulation) to your feet and legs, which can cause your skin to:  Become thinner and drier.  Break more easily.  Heal more slowly.  Peel and crack. You may also have nerve damage (neuropathy) in your legs and feet, causing decreased feeling in them. This means that you may not notice minor injuries to your feet that could lead to more serious problems. Noticing and addressing any potential problems early is the best way to prevent future foot problems. How to care for your feet Foot hygiene  Wash your feet daily with warm water and mild soap. Do not use hot water. Then, pat your feet and the areas between your toes until they are completely dry. Do not soak your feet as this can dry your skin.  Trim your toenails straight across. Do not dig under them or around the cuticle. File the edges of your nails with an emery board or nail file.  Apply a moisturizing lotion or petroleum jelly to the skin on your feet and to dry, brittle toenails. Use lotion that does not contain alcohol and is unscented. Do not apply lotion between your toes.   Shoes and socks  Wear clean socks or stockings every day. Make sure they are not too tight. Do not wear knee-high stockings since they may decrease blood flow to your legs.  Wear shoes that fit properly and have enough cushioning. Always look in your shoes before you put them on to be sure there are no objects inside.  To break in new shoes, wear them for just a few hours a day. This prevents injuries on your feet. Wounds, scrapes, corns, and calluses  Check your feet daily for blisters, cuts, bruises, sores, and redness. If you cannot see the bottom of your feet, use a mirror or ask someone for help.  Do not cut corns or calluses or try to remove them with medicine.  If you  find a minor scrape, cut, or break in the skin on your feet, keep it and the skin around it clean and dry. You may clean these areas with mild soap and water. Do not clean the area with peroxide, alcohol, or iodine.  If you have a wound, scrape, corn, or callus on your foot, look at it several times a day to make sure it is healing and not infected. Check for: ? Redness, swelling, or pain. ? Fluid or blood. ? Warmth. ? Pus or a bad smell.   General tips  Do not cross your legs. This may decrease blood flow to your feet.  Do not use heating pads or hot water bottles on your feet. They may burn your skin. If you have lost feeling in your feet or legs, you may not know this is happening until it is too late.  Protect your feet from hot and cold by wearing shoes, such as at the beach or on hot pavement.  Schedule a complete foot exam at least once a year (annually) or more often if you have foot problems. Report any cuts, sores, or bruises to your health care provider immediately. Where to find more information  American Diabetes Association: www.diabetes.org  Association of Diabetes Care & Education Specialists: www.diabeteseducator.org Contact a health care provider if:  You have a medical condition that increases your risk of infection and   you have any cuts, sores, or bruises on your feet.  You have an injury that is not healing.  You have redness on your legs or feet.  You feel burning or tingling in your legs or feet.  You have pain or cramps in your legs and feet.  Your legs or feet are numb.  Your feet always feel cold.  You have pain around any toenails. Get help right away if:  You have a wound, scrape, corn, or callus on your foot and: ? You have pain, swelling, or redness that gets worse. ? You have fluid or blood coming from the wound, scrape, corn, or callus. ? Your wound, scrape, corn, or callus feels warm to the touch. ? You have pus or a bad smell coming from  the wound, scrape, corn, or callus. ? You have a fever. ? You have a red line going up your leg. Summary  Check your feet every day for blisters, cuts, bruises, sores, and redness.  Apply a moisturizing lotion or petroleum jelly to the skin on your feet and to dry, brittle toenails.  Wear shoes that fit properly and have enough cushioning.  If you have foot problems, report any cuts, sores, or bruises to your health care provider immediately.  Schedule a complete foot exam at least once a year (annually) or more often if you have foot problems. This information is not intended to replace advice given to you by your health care provider. Make sure you discuss any questions you have with your health care provider. Document Revised: 06/04/2020 Document Reviewed: 06/04/2020 Elsevier Patient Education  2021 Elsevier Inc.  

## 2021-01-11 NOTE — Progress Notes (Signed)
I,Michele Wang,acting as a Education administrator for Michele Greenland, MD.,have documented all relevant documentation on the behalf of Michele Greenland, MD,as directed by  Michele Greenland, MD while in the presence of Michele Greenland, MD.  This visit occurred during the SARS-CoV-2 public health emergency.  Safety protocols were in place, including screening questions prior to the visit, additional usage of staff PPE, and extensive cleaning of exam room while observing appropriate contact time as indicated for disinfecting solutions.  Subjective:     Patient ID: Michele Wang , female    DOB: 1951/08/30 , 70 y.o.   MRN: [id number]   Chief Complaint  Patient presents with  . Diabetes  . Hypertension    HPI  She is here today for DM check. She has had some episodes of hyperglycemia, but states her sugars are much better now. She reports she has made some dietary changes  and her sugars have improved. She reports she usually needs sliding scale at lunch/dinner. Has not needed to use it in the morning.   She reports she is now committed to eating better so she can have better control of her blood sugars. S he is still using 22 units of basal insulin.   Diabetes She presents for her follow-up diabetic visit. She has type 2 diabetes mellitus. There are no hypoglycemic associated symptoms. There are no diabetic associated symptoms. Pertinent negatives for diabetes include no blurred vision and no chest pain. There are no hypoglycemic complications. Diabetic complications include nephropathy. She is following a diabetic diet. She participates in exercise intermittently.  Hypertension This is a chronic problem. The current episode started more than 1 year ago. The problem has been gradually improving since onset. The problem is controlled. Pertinent negatives include no blurred vision, chest pain, orthopnea, palpitations or shortness of breath. Risk factors for coronary artery disease include diabetes mellitus,  dyslipidemia, post-menopausal state and sedentary lifestyle. The current treatment provides moderate improvement.     Past Medical History:  Diagnosis Date  . Breast cancer ([place])   . CKD (chronic kidney disease)   . Diabetes mellitus without complication ([place])    Type II  . Hyperlipidemia   . Hypertension   . UTI (urinary tract infection)      Family History  Problem Relation Age of Onset  . Aneurysm Mother   . Healthy Father      Current Outpatient Medications:  .  atorvastatin (LIPITOR) 40 MG tablet, Take 1 tablet by mouth Monday Wed Friday, Disp: 90 tablet, Rfl: 2 .  Continuous Blood Gluc Receiver (FREESTYLE LIBRE 14 DAY READER) DEVI, 1 each by Does not apply route QID., Disp: 2 each, Rfl: 2 .  Continuous Blood Gluc Sensor (FREESTYLE LIBRE 14 DAY SENSOR) MISC, Use as directed to check blood sugars 4 times per day dx: e11.65, Disp: 2 each, Rfl: 2 .  insulin detemir (LEVEMIR FLEXTOUCH) 100 UNIT/ML FlexPen, Inject 20 units subcutaneously daily at bedtime, max dose 60 units (Patient taking differently: 22 Units. Inject 20 units subcutaneously daily at bedtime, max dose 60 units), Disp: 15 pen, Rfl: 2 .  Olmesartan-amLODIPine-HCTZ 40-5-25 MG TABS, TAKE ONE TABLET BY MOUTH EVERY DAY, Disp: 90 tablet, Rfl: 2 .  tamoxifen (NOLVADEX) 20 MG tablet, Take 1 tablet (20 mg total) by mouth daily., Disp: 90 tablet, Rfl: 3 .  Vitamin D, Cholecalciferol, 50 MCG (2000 UT) CAPS, Take 2,000 Units by mouth daily. , Disp: , Rfl:  .  insulin aspart (FIASP FLEXTOUCH) 100 UNIT/ML FlexTouch  Pen, INJECT INTO SKIN SUBCUTANEOUS BEFORE MEALS PER SLIDING SCALE (Patient not taking: No sig reported), Disp: 6 mL, Rfl: 3 .  Insulin Pen Needle (PEN NEEDLES 3/16") 31G X 5 MM MISC, Use as directed, once daily., Disp: 90 each, Rfl: 2 .  nitrofurantoin (MACRODANTIN) 100 MG capsule, Take 100 mg by mouth 2 (two) times daily., Disp: , Rfl:  .  potassium chloride SA (KLOR-CON) 20 MEQ tablet, Take 1 tablet (20 mEq total) by  mouth 2 (two) times daily. (Patient not taking: No sig reported), Disp: 10 tablet, Rfl: 0   No Known Allergies   Review of Systems  Constitutional: Negative.   Eyes: Negative for blurred vision.  Respiratory: Negative.  Negative for shortness of breath.   Cardiovascular: Negative.  Negative for chest pain, palpitations and orthopnea.  Gastrointestinal: Negative.   Psychiatric/Behavioral: Negative.   All other systems reviewed and are negative.    Today's Vitals   01/11/21 1044  BP: 126/82  Pulse: 71  Temp: 98.2 F (36.8 C)  TempSrc: Oral  Weight: 165 lb (74.8 kg)  Height: 5\' 4"  (1.626 m)  PainSc: 0-No pain   Body mass index is 28.32 kg/m.  Wt Readings from Last 3 Encounters:  01/11/21 165 lb (74.8 kg)  11/11/20 158 lb 6.4 oz (71.8 kg)  10/12/20 156 lb (70.8 kg)   Objective:  Physical Exam Vitals and nursing note reviewed.  Constitutional:      Appearance: Normal appearance. She is obese.  HENT:     Head: Normocephalic and atraumatic.     Nose:     Comments: Masked     Mouth/Throat:     Comments: Masked  Cardiovascular:     Rate and Rhythm: Normal rate and regular rhythm.     Heart sounds: Normal heart sounds.  Pulmonary:     Effort: Pulmonary effort is normal.     Breath sounds: Normal breath sounds.  Musculoskeletal:     Cervical back: Normal range of motion.  Skin:    General: Skin is warm.  Neurological:     General: No focal deficit present.     Mental Status: She is alert and oriented to person, place, and time.         Assessment And Plan:     1. Type 2 diabetes mellitus with stage 3b chronic kidney disease, with long-term current use of insulin (HCC) Comments: Chronic, now checking her sugars 4x/day. Will send rx Elenor Legato as requested.   2. Parenchymal renal hypertension, stage 1 through stage 4 or unspecified chronic kidney disease Comments: Chronic controlled. She is encouraged to limit her salt intake. She will c/w generic Tribenzor as  well.  3. Aortic atherosclerosis (HCC) Comments: Chronic, advised to follow heart healthy lifestyle by increasing exercise, avoiding fried foods and taking statin daily.   4. Malignant neoplasm of upper-outer quadrant of left breast in female, estrogen receptor positive ([place]) Comments: She is followed by Oncology and is encouraged to keep upcoming appts.   5. Overweight (BMI 25.0-29.9) Comments: Her BMI is acceptable for her demographic. Advised to aim for at least 150 minutes of exercise per week.   Patient was given opportunity to ask questions. Patient verbalized understanding of the plan and was able to repeat key elements of the plan. All questions were answered to their satisfaction.   I, Michele Greenland, MD, have reviewed all documentation for this visit. The documentation on 01/24/21 for the exam, diagnosis, procedures, and orders are all accurate and complete.  THE  PATIENT IS ENCOURAGED TO PRACTICE SOCIAL DISTANCING DUE TO THE COVID-19 PANDEMIC.   

## 2021-01-26 DIAGNOSIS — D0512 Intraductal carcinoma in situ of left breast: Secondary | ICD-10-CM | POA: Diagnosis not present

## 2021-01-31 ENCOUNTER — Other Ambulatory Visit: Payer: Self-pay | Admitting: Nurse Practitioner

## 2021-01-31 DIAGNOSIS — Z794 Long term (current) use of insulin: Secondary | ICD-10-CM

## 2021-01-31 DIAGNOSIS — E1122 Type 2 diabetes mellitus with diabetic chronic kidney disease: Secondary | ICD-10-CM

## 2021-01-31 DIAGNOSIS — N1832 Chronic kidney disease, stage 3b: Secondary | ICD-10-CM

## 2021-01-31 MED ORDER — FIASP FLEXTOUCH 100 UNIT/ML ~~LOC~~ SOPN: PEN_INJECTOR | SUBCUTANEOUS | 3 refills | Status: DC

## 2021-01-31 NOTE — Progress Notes (Signed)
Received on call in reference to needing a refill on her fiasp due to her blood sugar being 300 and she is out of the medication. Sent to [facility] [address] [facility] to [facility] [address] [facility].

## 2021-02-08 IMAGING — CT CT CHEST W/O CM
1 series · 15 of 34 positions shown, 19 images · non-contrast
Comparison: 08/28/2019 CT abdomen.

CLINICAL DATA: Follow-up lingular opacity. History of left breast
cancer status post lumpectomy and radiation therapy.

EXAM:
CT CHEST WITHOUT CONTRAST
TECHNIQUE: Multidetector CT imaging of the chest was performed following the
standard protocol without IV contrast.

[Series 2: chest w/(date) · axial · 0.66mm/px · z∈[-231,+7]mm · 15 of 141 slices shown, 19 images]
[im 11/141  mediastinal]
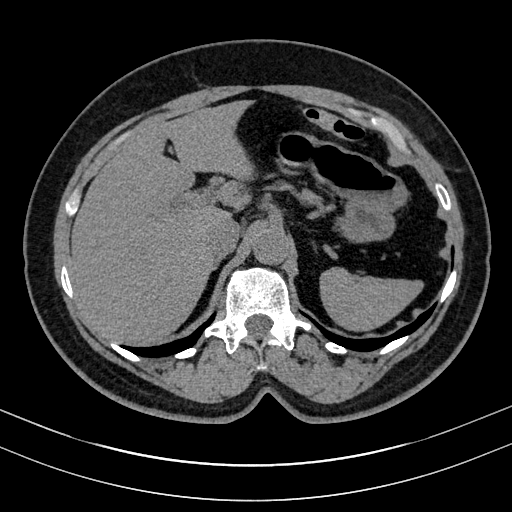
[im 11/141  lung]
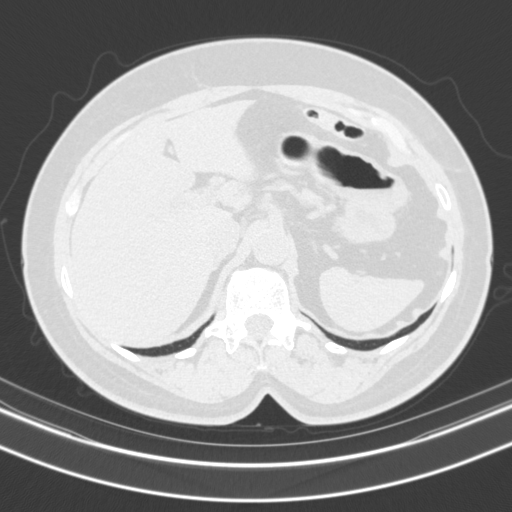
[im 21/141  lung]
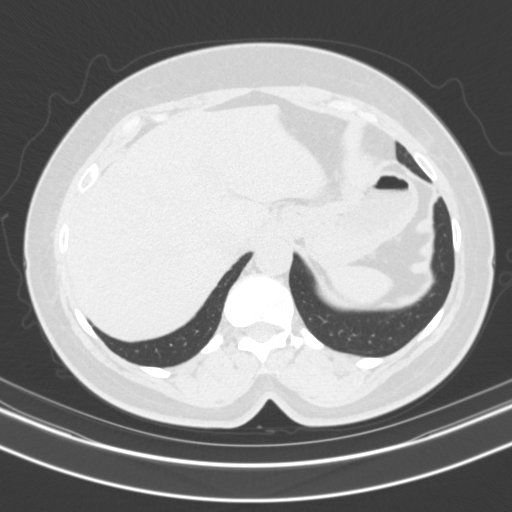
[im 29/141  lung]
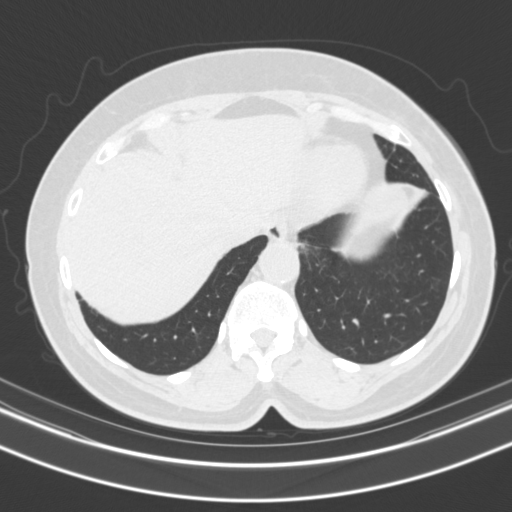
[im 37/141  lung]
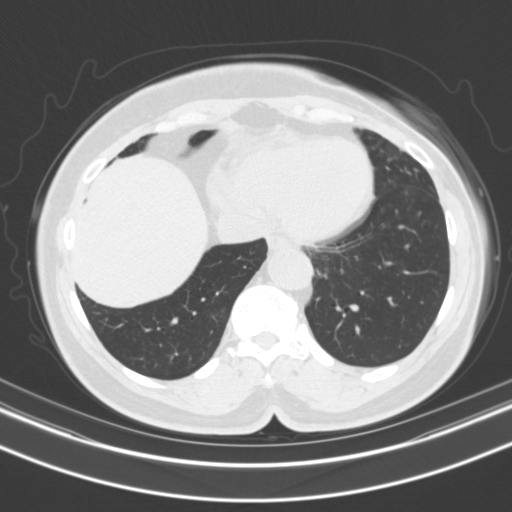
[im 47/141  mediastinal]
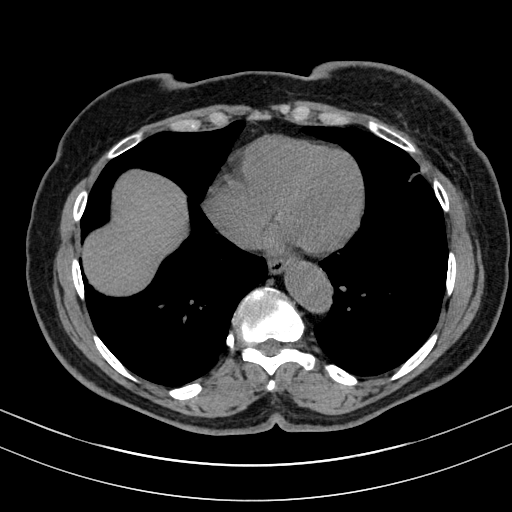
[im 47/141  lung]
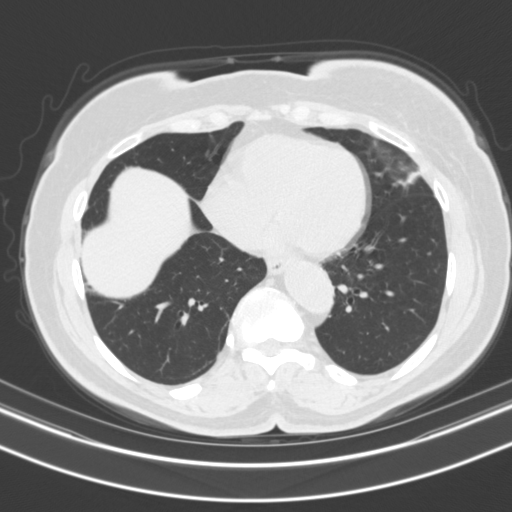
[im 57/141  lung]
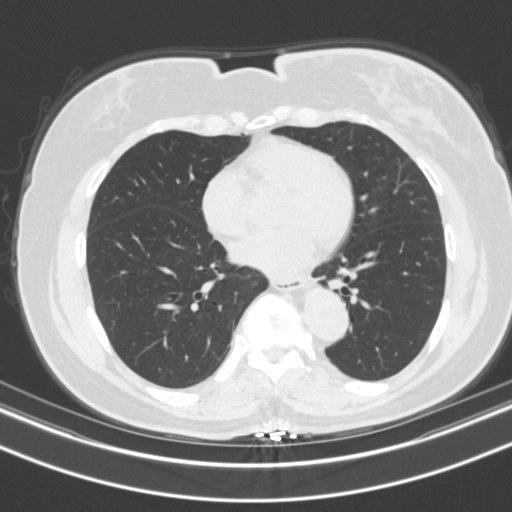
[im 63/141  lung]
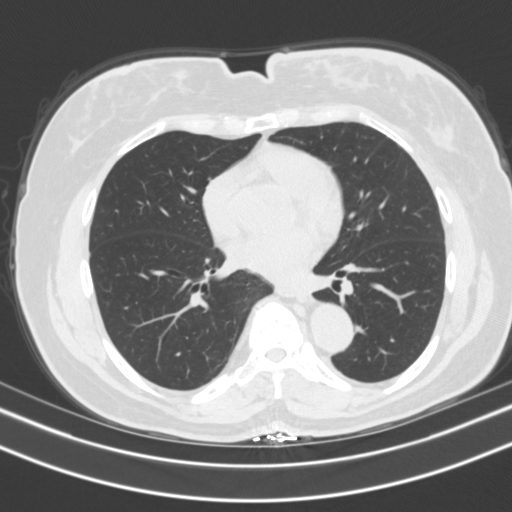
[im 73/141  lung]
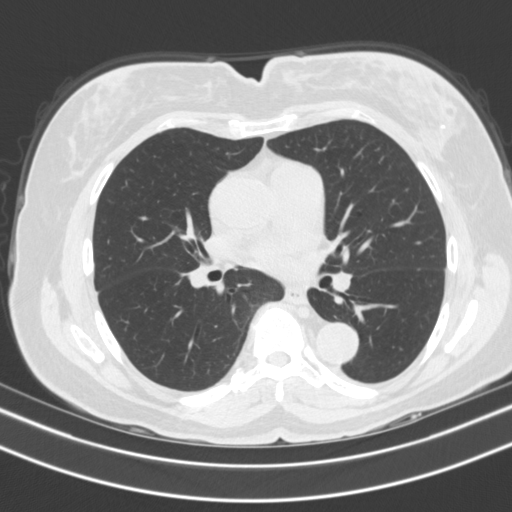
[im 78/141  mediastinal]
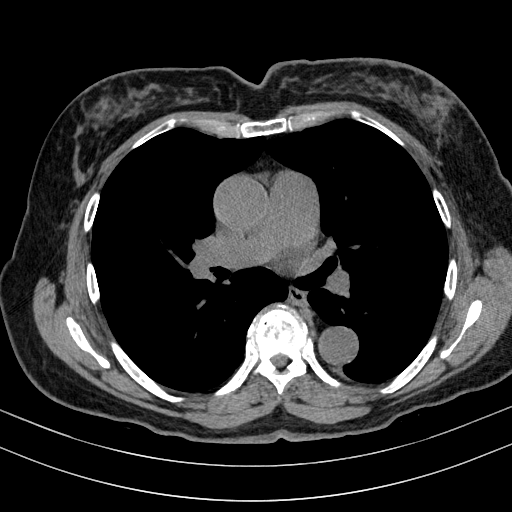
[im 78/141  lung]
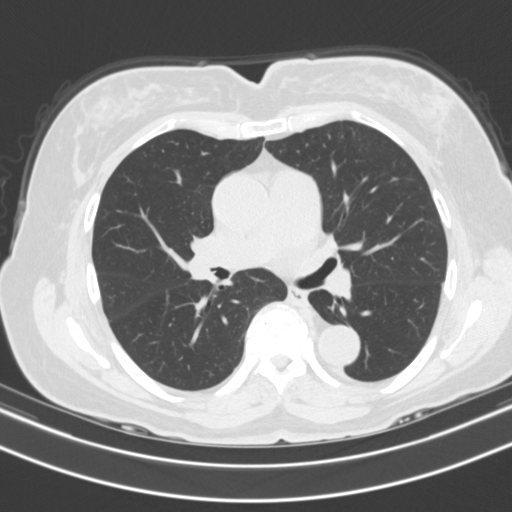
[im 85/141  lung]
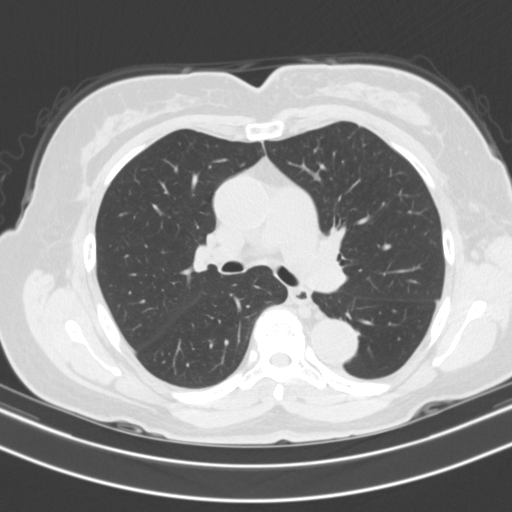
[im 94/141  lung]
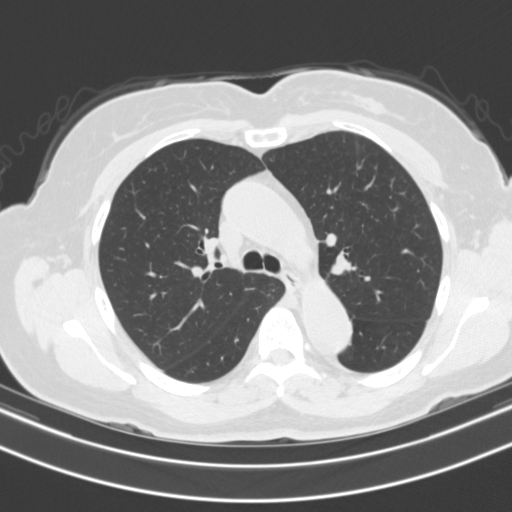
[im 104/141  lung]
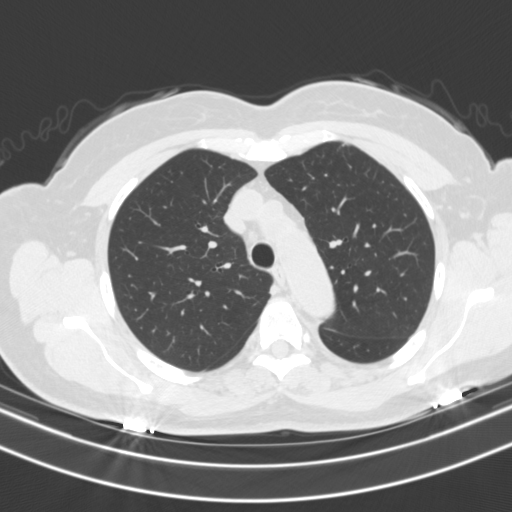
[im 113/141  mediastinal]
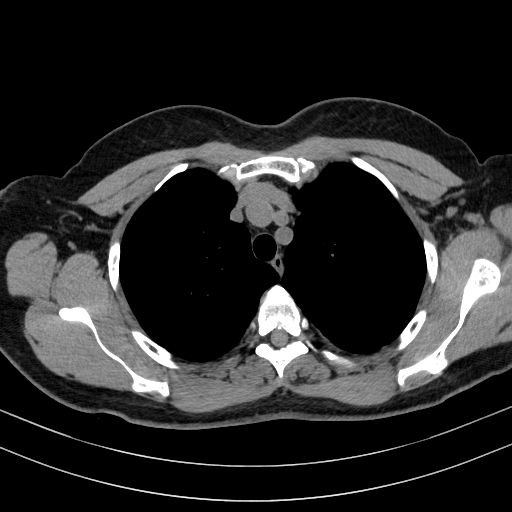
[im 113/141  lung]
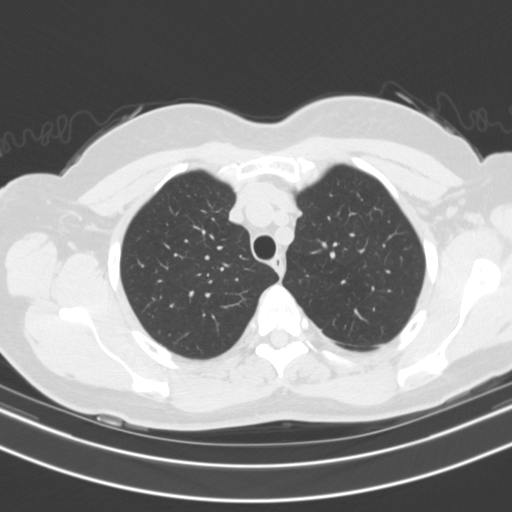
[im 120/141  lung]
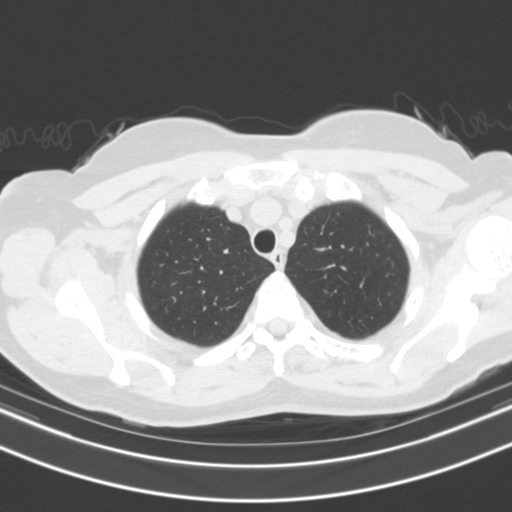
[im 130/141  lung]
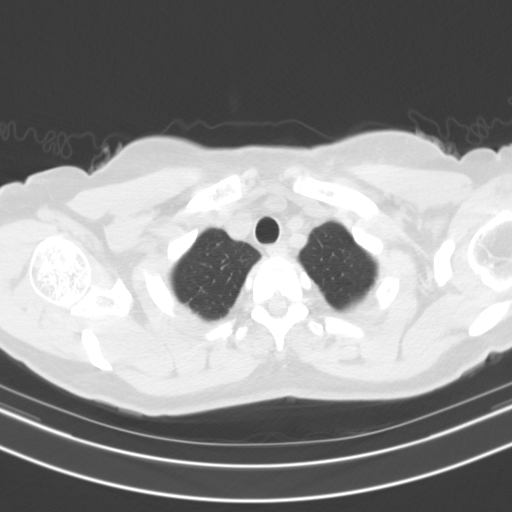

[15 of 34 positions shown; findings below may reference images not displayed]

FINDINGS: Cardiovascular: Normal heart size. No significant pericardial
effusion/thickening. Mildly atherosclerotic nonaneurysmal thoracic
aorta. Top-normal caliber main pulmonary artery (3.4 cm diameter).

Mediastinum/Nodes: No discrete thyroid nodules. Unremarkable
esophagus. No pathologically enlarged axillary, mediastinal or hilar
lymph nodes, noting limited sensitivity for the detection of hilar
adenopathy on this noncontrast study.

Lungs/Pleura: No pneumothorax. No pleural effusion. Subpleural 3 mm
solid anterior right upper lobe pulmonary nodule (series 3/image
34). No acute consolidative airspace disease or lung masses.
Curvilinear parenchymal band in inferior segment lingula is
unchanged and compatible with postinfectious/postinflammatory
scarring. No additional significant pulmonary nodules.

Upper abdomen: Cholecystectomy.

Musculoskeletal: No aggressive appearing focal osseous lesions.
Surgical clips in the outer left breast. Moderate thoracic
spondylosis.
IMPRESSION: 1. Stable curvilinear scar in the lingula.
2. Solitary 3 mm solid subpleural right upper lobe pulmonary nodule,
probably benign. Given the history of breast cancer and absence of
prior comparison chest CT studies, follow-up chest CT recommended in
3-6 months.

Aortic Atherosclerosis ([id number]-[id number].C).

## 2021-02-16 ENCOUNTER — Other Ambulatory Visit: Payer: Self-pay

## 2021-02-16 ENCOUNTER — Encounter (INDEPENDENT_AMBULATORY_CARE_PROVIDER_SITE_OTHER): Payer: Self-pay | Admitting: Ophthalmology

## 2021-02-16 ENCOUNTER — Ambulatory Visit (INDEPENDENT_AMBULATORY_CARE_PROVIDER_SITE_OTHER): Payer: Medicare PPO | Admitting: Ophthalmology

## 2021-02-16 DIAGNOSIS — H2513 Age-related nuclear cataract, bilateral: Secondary | ICD-10-CM

## 2021-02-16 DIAGNOSIS — H43811 Vitreous degeneration, right eye: Secondary | ICD-10-CM | POA: Insufficient documentation

## 2021-02-16 DIAGNOSIS — E113393 Type 2 diabetes mellitus with moderate nonproliferative diabetic retinopathy without macular edema, bilateral: Secondary | ICD-10-CM

## 2021-02-16 NOTE — Assessment & Plan Note (Signed)
The nature of moderate nonproliferative diabetic retinopathy was discussed with the patient as well as the need for more frequent follow up to judge for progression. Good blood glucose, blood pressure, and serum lipid control was recommended as well as avoidance of smoking and maintenance of normal weight.  Close follow up with PCP encouraged. °

## 2021-02-16 NOTE — Assessment & Plan Note (Signed)

## 2021-02-16 NOTE — Assessment & Plan Note (Signed)
The nature of cataract was discussed with the patient as well as the elective nature of surgery. The patient was reassured that surgery at a later date does not put the patient at risk for a worse outcome. It was emphasized that the need for surgery is dictated by the patient's quality of life as influenced by the cataract. Patient was instructed to maintain close follow up with their general eye care doctor.  No visual impact

## 2021-02-16 NOTE — Progress Notes (Signed)
02/16/2021     CHIEF COMPLAINT Patient presents for Retina Follow Up (9 Mo F/U OU//Pt denies noticeable changes to [place] OU since last visit. Pt denies ocular pain, flashes of light, or floaters OU. /A1c: 10, 3 mo ago approx/LBS: 160 this AM)   HISTORY OF PRESENT ILLNESS: Michele Wang is a 70 y.o. female who presents to the clinic today for:   HPI    Retina Follow Up    Patient presents with  Diabetic Retinopathy.  In both eyes.  This started 9 months ago.  Severity is mild.  Duration of 9 months.  Since onset it is stable. Additional comments: 9 Mo F/U OU  Pt denies noticeable changes to [place] OU since last visit. Pt denies ocular pain, flashes of light, or floaters OU.  A1c: 10, 3 mo ago approx LBS: 160 this AM       Last edited by Rockie Neighbours, [place] on 02/16/2021  8:41 AM. (History)      Referring physician: [name], [facility] [address] [place],  [place] [address]  HISTORICAL INFORMATION:   Selected notes from the MEDICAL RECORD NUMBER    Lab Results  Component Value Date   HGBA1C 8.1 (H) 11/11/2020     CURRENT MEDICATIONS: No current outpatient medications on file. (Ophthalmic Drugs)   No current facility-administered medications for this visit. (Ophthalmic Drugs)   Current Outpatient Medications (Other)  Medication Sig  . atorvastatin (LIPITOR) 40 MG tablet Take 1 tablet by mouth Monday Wed Friday  . Continuous Blood Gluc Receiver (FREESTYLE LIBRE 14 DAY READER) DEVI 1 each by Does not apply route QID.  Marland Kitchen Continuous Blood Gluc Sensor (FREESTYLE LIBRE 14 DAY SENSOR) MISC Use as directed to check blood sugars 4 times per day dx: e11.65  . insulin aspart (FIASP FLEXTOUCH) 100 UNIT/ML FlexTouch Pen INJECT INTO SKIN SUBCUTANEOUS BEFORE MEALS PER SLIDING SCALE  . insulin detemir (LEVEMIR FLEXTOUCH) 100 UNIT/ML FlexPen Inject 20 units subcutaneously daily at bedtime, max dose 60 units (Patient taking differently: 22 Units. Inject 20 units subcutaneously daily  at bedtime, max dose 60 units)  . Insulin Pen Needle (PEN NEEDLES 3/16") 31G X 5 MM MISC Use as directed, once daily.  . nitrofurantoin (MACRODANTIN) 100 MG capsule Take 100 mg by mouth 2 (two) times daily.  . Olmesartan-amLODIPine-HCTZ 40-5-25 MG TABS TAKE ONE TABLET BY MOUTH EVERY DAY  . potassium chloride SA (KLOR-CON) 20 MEQ tablet Take 1 tablet (20 mEq total) by mouth 2 (two) times daily. (Patient not taking: No sig reported)  . tamoxifen (NOLVADEX) 20 MG tablet Take 1 tablet (20 mg total) by mouth daily.  . Vitamin D, Cholecalciferol, 50 MCG (2000 UT) CAPS Take 2,000 Units by mouth daily.    No current facility-administered medications for this visit. (Other)      REVIEW OF SYSTEMS:    ALLERGIES No Known Allergies  PAST MEDICAL HISTORY Past Medical History:  Diagnosis Date  . Breast cancer ([place])   . CKD (chronic kidney disease)   . Diabetes mellitus without complication ([place])    Type II  . Hyperlipidemia   . Hypertension   . UTI (urinary tract infection)    Past Surgical History:  Procedure Laterality Date  . BREAST LUMPECTOMY WITH RADIOACTIVE SEED LOCALIZATION Left 08/21/2019   Procedure: LEFT BREAST LUMPECTOMY WITH RADIOACTIVE SEED LOCALIZATION;  Surgeon: Jovita Kussmaul, MD;  Location: [facility];  Service: General;  Laterality: Left;  . CESAREAN SECTION  x2    FAMILY HISTORY Family History  Problem Relation Age of Onset  . Aneurysm Mother   . Healthy Father     SOCIAL HISTORY Social History   Tobacco Use  . Smoking status: Never Smoker  . Smokeless tobacco: Never Used  Vaping Use  . Vaping Use: Never used  Substance Use Topics  . Alcohol use: No  . Drug use: No         OPHTHALMIC EXAM:  Base Eye Exam    Visual Acuity (ETDRS)      Right Left   Dist [place] 20/30 20/40   Dist ph [place] 20/25 +2 20/30 -1       Tonometry (Tonopen, 8:42 AM)      Right Left   Pressure 10 17       Pupils      Pupils Dark Light Shape React APD    Right PERRL 4 3 Round Brisk None   Left PERRL 4 3 Round Brisk None       Visual Fields (Counting fingers)      Left Right    Full Full       Extraocular Movement      Right Left    Full Full       Neuro/Psych    Oriented x3: Yes   Mood/Affect: Normal       Dilation    Both eyes: 1.0% Mydriacyl, 2.5% Phenylephrine @ 8:46 AM        Slit Lamp and Fundus Exam    External Exam      Right Left   External Normal Normal       Slit Lamp Exam      Right Left   Lids/Lashes Normal Normal   Conjunctiva/Sclera White and quiet White and quiet   Cornea Clear Clear   Anterior Chamber Deep and quiet Deep and quiet   Iris Round and reactive Round and reactive   Lens 1+ Nuclear sclerosis 1+ Nuclear sclerosis   Anterior Vitreous Normal Normal       Fundus Exam      Right Left   Posterior Vitreous ,, Posterior vitreous detachment Normal   Disc Normal Normal   C/D Ratio 0.45 0.5   Macula ,, no exudates, no macular thickening, no clinically significant macular edema, Microaneurysms ,, no exudates, no macular thickening, Microaneurysms, no clinically significant macular edema   Vessels NPDR- Moderate NPDR- Moderate   Periphery Normal Normal          IMAGING AND PROCEDURES  Imaging and Procedures for 02/16/21  OCT, Retina - OU - Both Eyes       Right Eye Quality was good. Scan locations included subfoveal. Central Foveal Thickness: 270. Progression has been stable. Findings include normal foveal contour.   Left Eye Quality was good. Scan locations included subfoveal. Central Foveal Thickness: 247. Progression has been stable. Findings include normal foveal contour, vitreomacular adhesion .   Notes Incidental posterior vitreous detachment right eye  VMA is present left eye with no topographic distortion observed                ASSESSMENT/PLAN:  Moderate nonproliferative diabetic retinopathy of both eyes without macular edema associated with type 2 diabetes  mellitus (HCC) The nature of moderate nonproliferative diabetic retinopathy was discussed with the patient as well as the need for more frequent follow up to judge for progression. Good blood glucose, blood pressure, and serum lipid control was recommended as well as avoidance of smoking  and maintenance of normal weight.  Close follow up with PCP encouraged.  Nuclear sclerotic cataract of both eyes The nature of cataract was discussed with the patient as well as the elective nature of surgery. The patient was reassured that surgery at a later date does not put the patient at risk for a worse outcome. It was emphasized that the need for surgery is dictated by the patient's quality of life as influenced by the cataract. Patient was instructed to maintain close follow up with their general eye care doctor.  No visual impact  Posterior vitreous detachment of right eye   The nature of posterior vitreous detachment was discussed with the patient as well as its physiology, its age prevalence, and its possible implication regarding retinal breaks and detachment.  An informational brochure was given to the patient.  All the patient's questions were answered.  The patient was asked to return if new or different flashes or floaters develops.   Patient was instructed to contact office immediately if any changes were noticed. I explained to the patient that vitreous inside the eye is similar to jello inside a bowl. As the jello melts it can start to pull away from the bowl, similarly the vitreous throughout our lives can begin to pull away from the retina. That process is called a posterior vitreous detachment. In some cases, the vitreous can tug hard enough on the retina to form a retinal tear. I discussed with the patient the signs and symptoms of a retinal detachment.  Do not rub the eye.      ICD-10-CM   1. Moderate nonproliferative diabetic retinopathy of both eyes without macular edema associated with type 2  diabetes mellitus (HCC)  W29.9371 OCT, Retina - OU - Both Eyes  2. Nuclear sclerotic cataract of both eyes  H25.13   3. Posterior vitreous detachment of right eye  H43.811     1.  Findings today were reviewed with the patient.  I am happy to report to her that there has been no progression of the diabetic retinopathy stage beyond moderate nonproliferative disease.  She understands critical importance of continued blood sugar control monitoring is her best chance to prevent further progression.  Nonetheless, with stage of retinopathy there is a real chance of progression to severe NPDR at which time iteration could be undertaken to halt progression medically with therapeutic intervention.  It is to say intravitreal injections.  No therapy warranted at this time.  2.  No visually significant cataract.  3.  Ophthalmic Meds Ordered this visit:  No orders of the defined types were placed in this encounter.      Return in about 9 months (around 11/18/2021) for DILATE OU, OCT.  There are no Patient Instructions on file for this visit.   Explained the diagnoses, plan, and follow up with the patient and they expressed understanding.  Patient expressed understanding of the importance of proper follow up care.   Clent Demark Jamarkus Lisbon M.D. Diseases & Surgery of the Retina and Vitreous Retina & Diabetic [place] 02/16/21     Abbreviations: M myopia (nearsighted); A astigmatism; H hyperopia (farsighted); P presbyopia; Mrx spectacle prescription;  CTL contact lenses; OD right eye; OS left eye; OU both eyes  XT exotropia; ET esotropia; PEK punctate epithelial keratitis; PEE punctate epithelial erosions; DES dry eye syndrome; MGD meibomian gland dysfunction; ATs artificial tears; PFAT's preservative free artificial tears; [place] nuclear sclerotic cataract; PSC posterior subcapsular cataract; ERM epi-retinal membrane; PVD posterior vitreous detachment; RD  retinal detachment; DM diabetes mellitus; DR  diabetic retinopathy; NPDR non-proliferative diabetic retinopathy; PDR proliferative diabetic retinopathy; CSME clinically significant macular edema; DME diabetic macular edema; dbh dot blot hemorrhages; CWS cotton wool spot; POAG primary open angle glaucoma; C/D cup-to-disc ratio; HVF humphrey visual field; GVF goldmann visual field; OCT optical coherence tomography; IOP intraocular pressure; BRVO Branch retinal vein occlusion; CRVO central retinal vein occlusion; CRAO central retinal artery occlusion; BRAO branch retinal artery occlusion; RT retinal tear; SB scleral buckle; PPV pars plana vitrectomy; VH Vitreous hemorrhage; PRP panretinal laser photocoagulation; IVK intravitreal kenalog; VMT vitreomacular traction; MH Macular hole;  NVD neovascularization of the disc; NVE neovascularization elsewhere; AREDS age related eye disease study; ARMD age related macular degeneration; POAG primary open angle glaucoma; EBMD epithelial/anterior basement membrane dystrophy; ACIOL anterior chamber intraocular lens; IOL intraocular lens; PCIOL posterior chamber intraocular lens; Phaco/IOL phacoemulsification with intraocular lens placement; [place] photorefractive keratectomy; LASIK laser assisted in situ keratomileusis; HTN hypertension; DM diabetes mellitus; COPD chronic obstructive pulmonary disease

## 2021-02-17 ENCOUNTER — Encounter: Payer: Self-pay | Admitting: Internal Medicine

## 2021-02-17 ENCOUNTER — Other Ambulatory Visit: Payer: Self-pay

## 2021-02-17 ENCOUNTER — Ambulatory Visit (INDEPENDENT_AMBULATORY_CARE_PROVIDER_SITE_OTHER): Payer: Medicare PPO

## 2021-02-17 ENCOUNTER — Telehealth: Payer: Self-pay | Admitting: *Deleted

## 2021-02-17 ENCOUNTER — Ambulatory Visit (INDEPENDENT_AMBULATORY_CARE_PROVIDER_SITE_OTHER): Payer: Medicare PPO | Admitting: Internal Medicine

## 2021-02-17 VITALS — BP 118/70 | HR 68 | Temp 98.5°F | Ht 65.2 in | Wt 162.8 lb

## 2021-02-17 DIAGNOSIS — I129 Hypertensive chronic kidney disease with stage 1 through stage 4 chronic kidney disease, or unspecified chronic kidney disease: Secondary | ICD-10-CM | POA: Diagnosis not present

## 2021-02-17 DIAGNOSIS — Z0000 Encounter for general adult medical examination without abnormal findings: Secondary | ICD-10-CM

## 2021-02-17 DIAGNOSIS — E1122 Type 2 diabetes mellitus with diabetic chronic kidney disease: Secondary | ICD-10-CM | POA: Diagnosis not present

## 2021-02-17 DIAGNOSIS — N1832 Chronic kidney disease, stage 3b: Secondary | ICD-10-CM | POA: Diagnosis not present

## 2021-02-17 DIAGNOSIS — Z794 Long term (current) use of insulin: Secondary | ICD-10-CM

## 2021-02-17 DIAGNOSIS — E7800 Pure hypercholesterolemia, unspecified: Secondary | ICD-10-CM | POA: Diagnosis not present

## 2021-02-17 LAB — POCT UA - MICROALBUMIN
Albumin/Creatinine Ratio, Urine, POC: <30
Creatinine, POC: 200 mg/dL
Microalbumin Ur, POC: 30 mg/L

## 2021-02-17 MED ORDER — FREESTYLE LIBRE 14 DAY SENSOR MISC: 2 refills | Status: DC

## 2021-02-17 MED ORDER — ATORVASTATIN CALCIUM 40 MG PO TABS: ORAL_TABLET | ORAL | 2 refills | Status: DC

## 2021-02-17 NOTE — Addendum Note (Signed)
Addended by: Glenna Durand E on: 02/17/2021 02:00 PM   Modules accepted: Orders

## 2021-02-17 NOTE — Addendum Note (Signed)
Addended by: Kellie Simmering on: 02/17/2021 10:51 AM   Modules accepted: Level of Service

## 2021-02-17 NOTE — Chronic Care Management (AMB) (Signed)
  Chronic Care Management   Outreach Note  02/17/2021 Name: Michele Wang MRN: [id number] DOB: July 15, 1951  Michele Wang is a 70 y.o. year old female who is a primary care patient of [name], MD. I reached out to Nicola Police by phone today in response to a referral sent by [name]'s PCP, [name], MD     An unsuccessful telephone outreach was attempted today. The patient was referred to the case management team for assistance with care management and care coordination.   Follow Up Plan: A HIPAA compliant phone message was left for the patient providing contact information and requesting a return call.  The care management team will reach out to the patient again over the next 3 days.  If patient returns call to provider office, please advise to call [place] Lysle Morales at [facility]

## 2021-02-17 NOTE — Progress Notes (Signed)
Subjective:   Michele ZALDIVAR is a 70 y.o. female who presents for an Initial Medicare Annual Wellness Visit.  Review of Systems     Cardiac Risk Factors include: advanced age (>36men, >10 women);diabetes mellitus;sedentary lifestyle     Objective:    Today's Vitals   02/17/21 0954  BP: 118/70  Pulse: 68  Temp: 98.5 F (36.9 C)  TempSrc: Oral  SpO2: 95%  Weight: 162 lb 12.8 oz (73.8 kg)  Height: 5' 5.2" (1.656 m)   Body mass index is 26.93 kg/m.  Advanced Directives 02/17/2021 10/12/2020 08/30/2020 02/14/2020 02/13/2020 02/11/2020 09/05/2019  Does Patient Have a Medical Advance Directive? Yes Yes No No No No No  Type of Paramedic of [place];Living will - - - - - -  Copy of [place] in Chart? No - copy requested - - - - - -  Would patient like information on creating a medical advance directive? - - - No - Patient declined No - Patient declined No - Patient declined -    Current Medications (verified) Outpatient Encounter Medications as of 02/17/2021  Medication Sig  . atorvastatin (LIPITOR) 40 MG tablet Take 1 tablet by mouth Monday Wed Friday  . Continuous Blood Gluc Receiver (FREESTYLE LIBRE 14 DAY READER) DEVI 1 each by Does not apply route QID.  Marland Kitchen Continuous Blood Gluc Sensor (FREESTYLE LIBRE 14 DAY SENSOR) MISC Use as directed to check blood sugars 4 times per day dx: e11.65  . insulin aspart (FIASP FLEXTOUCH) 100 UNIT/ML FlexTouch Pen INJECT INTO SKIN SUBCUTANEOUS BEFORE MEALS PER SLIDING SCALE  . insulin detemir (LEVEMIR FLEXTOUCH) 100 UNIT/ML FlexPen Inject 20 units subcutaneously daily at bedtime, max dose 60 units (Patient taking differently: 22 Units. Inject 20 units subcutaneously daily at bedtime, max dose 60 units)  . Insulin Pen Needle (PEN NEEDLES 3/16") 31G X 5 MM MISC Use as directed, once daily.  . Olmesartan-amLODIPine-HCTZ 40-5-25 MG TABS TAKE ONE TABLET BY MOUTH EVERY DAY  . tamoxifen (NOLVADEX) 20 MG tablet Take 1  tablet (20 mg total) by mouth daily.  . Vitamin D, Cholecalciferol, 50 MCG (2000 UT) CAPS Take 2,000 Units by mouth daily.   . nitrofurantoin (MACRODANTIN) 100 MG capsule Take 100 mg by mouth 2 (two) times daily. (Patient not taking: Reported on 02/17/2021)  . potassium chloride SA (KLOR-CON) 20 MEQ tablet Take 1 tablet (20 mEq total) by mouth 2 (two) times daily. (Patient not taking: Reported on 02/17/2021)   No facility-administered encounter medications on file as of 02/17/2021.    Allergies (verified) Patient has no known allergies.   History: Past Medical History:  Diagnosis Date  . Breast cancer ([place])   . CKD (chronic kidney disease)   . Diabetes mellitus without complication ([place])    Type II  . Hyperlipidemia   . Hypertension   . UTI (urinary tract infection)    Past Surgical History:  Procedure Laterality Date  . BREAST LUMPECTOMY WITH RADIOACTIVE SEED LOCALIZATION Left 08/21/2019   Procedure: LEFT BREAST LUMPECTOMY WITH RADIOACTIVE SEED LOCALIZATION;  Surgeon: Jovita Kussmaul, MD;  Location: [facility];  Service: General;  Laterality: Left;  . CESAREAN SECTION     x2   Family History  Problem Relation Age of Onset  . Aneurysm Mother   . Healthy Father    Social History   Socioeconomic History  . Marital status: Married    Spouse name: Not on file  . Number of children: Not on file  .  Years of education: Not on file  . Highest education level: Not on file  Occupational History  . Occupation: retired  Tobacco Use  . Smoking status: Never Smoker  . Smokeless tobacco: Never Used  Vaping Use  . Vaping Use: Never used  Substance and Sexual Activity  . Alcohol use: No  . Drug use: No  . Sexual activity: Yes  Other Topics Concern  . Not on file  Social History Narrative  . Not on file   Social Determinants of Health   Financial Resource Strain: Low Risk   . Difficulty of Paying Living Expenses: Not hard at all  Food Insecurity: No Food  Insecurity  . Worried About Charity fundraiser in the Last Year: Never true  . Ran Out of Food in the Last Year: Never true  Transportation Needs: No Transportation Needs  . Lack of Transportation (Medical): No  . Lack of Transportation (Non-Medical): No  Physical Activity: Inactive  . Days of Exercise per Week: 0 days  . Minutes of Exercise per Session: 0 min  Stress: No Stress Concern Present  . Feeling of Stress : Not at all  Social Connections: Not on file    Tobacco Counseling Counseling given: Not Answered   Clinical Intake:  Pre-visit preparation completed: Yes  Pain : No/denies pain     Nutritional Status: BMI 25 -29 Overweight Nutritional Risks: None Diabetes: Yes  How often do you need to have someone help you when you read instructions, pamphlets, or other written materials from your doctor or pharmacy?: 1 - Never What is the last grade level you completed in school?: 12th grade  Diabetic? Yes Nutrition Risk Assessment:  Has the patient had any N/V/D within the last 2 months?  No  Does the patient have any non-healing wounds?  No  Has the patient had any unintentional weight loss or weight gain?  No   Diabetes:  Is the patient diabetic?  Yes  If diabetic, was a CBG obtained today?  No  Did the patient bring in their glucometer from home?  No  How often do you monitor your CBG's? daily.   Financial Strains and Diabetes Management:  Are you having any financial strains with the device, your supplies or your medication? No .  Does the patient want to be seen by Chronic Care Management for management of their diabetes?  No  Would the patient like to be referred to a Nutritionist or for Diabetic Management?  No   Diabetic Exams:  Diabetic Eye Exam: Completed 02/16/2021 Diabetic Foot Exam: Completed 07/13/2020   Interpreter Needed?: No  Information entered by :: NAllen LPN   Activities of Daily Living In your present state of health, do you have  any difficulty performing the following activities: 02/17/2021  Hearing? N  Vision? N  Difficulty concentrating or making decisions? N  Walking or climbing stairs? N  Dressing or bathing? N  Doing errands, shopping? N  Preparing Food and eating ? N  Using the Toilet? N  In the past six months, have you accidently leaked urine? Y  Comment wears a pad sometimes  Do you have problems with loss of bowel control? N  Managing your Medications? N  Managing your Finances? N  Housekeeping or managing your Housekeeping? N  Some recent data might be hidden    Patient Care Team: [name], MD as PCP - General (Internal Medicine) Kyung Rudd, MD as Consulting Physician (Radiation Oncology) Jovita Kussmaul, MD as Consulting  Physician (General Surgery) Nicholas Lose, MD as Consulting Physician (Hematology and Oncology) Zadie Rhine Clent Demark, MD as Consulting Physician (Ophthalmology)  Indicate any recent Medical Services you may have received from other than [facility] providers in the past year (date may be approximate).     Assessment:   This is a routine wellness examination for [place].  Hearing/Vision screen  Hearing Screening   125Hz  250Hz  500Hz  1000Hz  2000Hz  3000Hz  4000Hz  6000Hz  8000Hz   Right ear:           Left ear:           Vision Screening Comments: Regular eye exams, Dr. Zadie Rhine, Dr. Katy Fitch  Dietary issues and exercise activities discussed: Current Exercise Habits: The patient does not participate in regular exercise at present  Goals    . DIET - DECREASE SODA OR JUICE INTAKE    . DIET - REDUCE FAST FOOD INTAKE    . Patient Stated     02/13/2020, to get a routine for exercise and be more healthy    . Patient Stated     02/17/2021, work on keeping blood sugar down      Depression Screen PHQ 2/9 Scores 02/17/2021 10/12/2020 02/14/2020 02/13/2020 08/08/2019 05/07/2019 02/05/2019  PHQ - 2 Score 0 0 0 0 0 0 0  PHQ- 9 Score - - - 0 - - -    Fall Risk Fall Risk  02/17/2021 10/12/2020 02/14/2020  02/13/2020 12/16/2019  Falls in the past year? 0 0 0 0 0  Risk for fall due to : Medication side effect - - Medication side effect -  Follow up Falls evaluation completed;Education provided;Falls prevention discussed - - Falls evaluation completed;Education provided;Falls prevention discussed -    FALL RISK PREVENTION PERTAINING TO THE HOME:  Any stairs in or around the home? Yes  If so, are there any without handrails? No  Home free of loose throw rugs in walkways, pet beds, electrical cords, etc? Yes  Adequate lighting in your home to reduce risk of falls? Yes   ASSISTIVE DEVICES UTILIZED TO PREVENT FALLS:  Life alert? No  Use of a cane, walker or w/c? No  Grab bars in the bathroom? No  Shower chair or bench in shower? No  Elevated toilet seat or a handicapped toilet? No   TIMED UP AND GO:  Was the test performed? No .   Gait steady and fast without use of assistive device  Cognitive Function:     6CIT Screen 02/17/2021 02/13/2020 02/05/2019  What Year? 0 points 0 points 0 points  What month? 0 points 0 points 0 points  What time? 0 points 0 points 0 points  Count back from 20 0 points 0 points 0 points  Months in reverse 0 points 0 points 0 points  Repeat phrase 0 points 2 points 0 points  Total Score 0 2 0    Immunizations Immunization History  Administered Date(s) Administered  . DTaP 08/03/2007, 10/14/2014  . Fluad Quad(high Dose 65+) 09/08/2020  . Influenza, High Dose Seasonal PF 11/06/2019  . PFIZER(Purple Top)SARS-COV-2 Vaccination 01/24/2020, 02/19/2020, 11/23/2020  . Pneumococcal Conjugate-13 03/03/2014  . Pneumococcal Polysaccharide-23 01/30/2016, 02/05/2019    TDAP status: Up to date  Flu Vaccine status: Up to date  Pneumococcal vaccine status: Up to date  Covid-19 vaccine status: Completed vaccines  Qualifies for Shingles Vaccine? Yes   Zostavax completed No   Shingrix Completed?: No.    Education has been provided regarding the importance of this  vaccine. Patient has been advised  to call insurance company to determine out of pocket expense if they have not yet received this vaccine. Advised may also receive vaccine at local pharmacy or Health Dept. Verbalized acceptance and understanding.  Screening Tests Health Maintenance  Topic Date Due  . HEMOGLOBIN A1C  05/12/2021  . COVID-19 Vaccine (4 - Booster for Pfizer series) 05/24/2021  . FOOT EXAM  07/13/2021  . OPHTHALMOLOGY EXAM  02/16/2022  . MAMMOGRAM  06/22/2022  . TETANUS/TDAP  10/14/2024  . COLONOSCOPY (Pts 45-69yrs Insurance coverage will need to be confirmed)  08/08/2029  . INFLUENZA VACCINE  Completed  . DEXA SCAN  Completed  . Hepatitis C Screening  Completed  . PNA vac Low Risk Adult  Completed  . HPV VACCINES  Aged Out    Health Maintenance  There are no preventive care reminders to display for this patient.  Colorectal cancer screening: Type of screening: Colonoscopy. Completed 08/09/2019. Repeat every 10 years  Mammogram status: Completed 06/22/2020. Repeat every year  Bone Density status: Completed 02/04/2020.   Lung Cancer Screening: (Low Dose CT Chest recommended if Age 89-80 years, 30 pack-year currently smoking OR have quit w/in 15years.) does not qualify.   Lung Cancer Screening Referral: no  Additional Screening:  Hepatitis C Screening: does qualify; Completed 01/01/2013  Vision Screening: Recommended annual ophthalmology exams for early detection of glaucoma and other disorders of the eye. Is the patient up to date with their annual eye exam?  Yes  Who is the provider or what is the name of the office in which the patient attends annual eye exams? Dr. Zadie Rhine, Dr. Katy Fitch If pt is not established with a provider, would they like to be referred to a provider to establish care? No .   Dental Screening: Recommended annual dental exams for proper oral hygiene  Community Resource Referral / Chronic Care Management: CRR required this visit?  No   CCM  required this visit?  No      Plan:     I have personally reviewed and noted the following in the patient's chart:   . Medical and social history . Use of alcohol, tobacco or illicit drugs  . Current medications and supplements . Functional ability and status . Nutritional status . Physical activity . Advanced directives . List of other physicians . Hospitalizations, surgeries, and ER visits in previous 12 months . Vitals . Screenings to include cognitive, depression, and falls . Referrals and appointments  In addition, I have reviewed and discussed with patient certain preventive protocols, quality metrics, and best practice recommendations. A written personalized care plan for preventive services as well as general preventive health recommendations were provided to patient.     Kellie Simmering, LPN   [id number]/62/2633   Nurse Notes:

## 2021-02-17 NOTE — Patient Instructions (Signed)
Michele Wang , Thank you for taking time to come for your Medicare Wellness Visit. I appreciate your ongoing commitment to your health goals. Please review the following plan we discussed and let me know if I can assist you in the future.   Screening recommendations/referrals: Colonoscopy: completed 08/09/2019 Mammogram: completed 06/22/2020 Bone Density: completed 02/04/2020 Recommended yearly ophthalmology/optometry visit for glaucoma screening and checkup Recommended yearly dental visit for hygiene and checkup  Vaccinations: Influenza vaccine: completed 09/08/2020, due 06/28/2021 Pneumococcal vaccine: completed 02/05/2019 Tdap vaccine: completed 10/14/2014, due 10/14/2014 Shingles vaccine: discussed   Covid-19: 11/23/2020, 02/19/2020, 01/24/2020  Advanced directives: Please bring a copy of your POA (Power of Attorney) and/or Living Will to your next appointment.   Conditions/risks identified: none  Next appointment: Follow up in one year for your annual wellness visit    Preventive Care 65 Years and Older, Female Preventive care refers to lifestyle choices and visits with your health care provider that can promote health and wellness. What does preventive care include?  A yearly physical exam. This is also called an annual well check.  Dental exams once or twice a year.  Routine eye exams. Ask your health care provider how often you should have your eyes checked.  Personal lifestyle choices, including:  Daily care of your teeth and gums.  Regular physical activity.  Eating a healthy diet.  Avoiding tobacco and drug use.  Limiting alcohol use.  Practicing safe sex.  Taking low-dose aspirin every day.  Taking vitamin and mineral supplements as recommended by your health care provider. What happens during an annual well check? The services and screenings done by your health care provider during your annual well check will depend on your age, overall health, lifestyle risk  factors, and family history of disease. Counseling  Your health care provider may ask you questions about your:  Alcohol use.  Tobacco use.  Drug use.  Emotional well-being.  Home and relationship well-being.  Sexual activity.  Eating habits.  History of falls.  Memory and ability to understand (cognition).  Work and work Statistician.  Reproductive health. Screening  You may have the following tests or measurements:  Height, weight, and BMI.  Blood pressure.  Lipid and cholesterol levels. These may be checked every 5 years, or more frequently if you are over 64 years old.  Skin check.  Lung cancer screening. You may have this screening every year starting at age 35 if you have a 30-pack-year history of smoking and currently smoke or have quit within the past 15 years.  Fecal occult blood test (FOBT) of the stool. You may have this test every year starting at age 21.  Flexible sigmoidoscopy or colonoscopy. You may have a sigmoidoscopy every 5 years or a colonoscopy every 10 years starting at age 93.  Hepatitis C blood test.  Hepatitis B blood test.  Sexually transmitted disease (STD) testing.  Diabetes screening. This is done by checking your blood sugar (glucose) after you have not eaten for a while (fasting). You may have this done every 1-3 years.  Bone density scan. This is done to screen for osteoporosis. You may have this done starting at age 8.  Mammogram. This may be done every 1-2 years. Talk to your health care provider about how often you should have regular mammograms. Talk with your health care provider about your test results, treatment options, and if necessary, the need for more tests. Vaccines  Your health care provider may recommend certain vaccines, such as:  Influenza  vaccine. This is recommended every year.  Tetanus, diphtheria, and acellular pertussis (Tdap, Td) vaccine. You may need a Td booster every 10 years.  Zoster vaccine. You  may need this after age 58.  Pneumococcal 13-valent conjugate (PCV13) vaccine. One dose is recommended after age 64.  Pneumococcal polysaccharide (PPSV23) vaccine. One dose is recommended after age 54. Talk to your health care provider about which screenings and vaccines you need and how often you need them. This information is not intended to replace advice given to you by your health care provider. Make sure you discuss any questions you have with your health care provider. Document Released: 12/11/2015 Document Revised: 08/03/2016 Document Reviewed: 09/15/2015 Elsevier Interactive Patient Education  2017 [place] Prevention in the Home Falls can cause injuries. They can happen to people of all ages. There are many things you can do to make your home safe and to help prevent falls. What can I do on the outside of my home?  Regularly fix the edges of walkways and driveways and fix any cracks.  Remove anything that might make you trip as you walk through a door, such as a raised step or threshold.  Trim any bushes or trees on the path to your home.  Use bright outdoor lighting.  Clear any walking paths of anything that might make someone trip, such as rocks or tools.  Regularly check to see if handrails are loose or broken. Make sure that both sides of any steps have handrails.  Any raised decks and porches should have guardrails on the edges.  Have any leaves, snow, or ice cleared regularly.  Use sand or salt on walking paths during winter.  Clean up any spills in your garage right away. This includes oil or grease spills. What can I do in the bathroom?  Use night lights.  Install grab bars by the toilet and in the tub and shower. Do not use towel bars as grab bars.  Use non-skid mats or decals in the tub or shower.  If you need to sit down in the shower, use a plastic, non-slip stool.  Keep the floor dry. Clean up any water that spills on the floor as soon as  it happens.  Remove soap buildup in the tub or shower regularly.  Attach bath mats securely with double-sided non-slip rug tape.  Do not have throw rugs and other things on the floor that can make you trip. What can I do in the bedroom?  Use night lights.  Make sure that you have a light by your bed that is easy to reach.  Do not use any sheets or blankets that are too big for your bed. They should not hang down onto the floor.  Have a firm chair that has side arms. You can use this for support while you get dressed.  Do not have throw rugs and other things on the floor that can make you trip. What can I do in the kitchen?  Clean up any spills right away.  Avoid walking on wet floors.  Keep items that you use a lot in easy-to-reach places.  If you need to reach something above you, use a strong step stool that has a grab bar.  Keep electrical cords out of the way.  Do not use floor polish or wax that makes floors slippery. If you must use wax, use non-skid floor wax.  Do not have throw rugs and other things on the floor that can  make you trip. What can I do with my stairs?  Do not leave any items on the stairs.  Make sure that there are handrails on both sides of the stairs and use them. Fix handrails that are broken or loose. Make sure that handrails are as long as the stairways.  Check any carpeting to make sure that it is firmly attached to the stairs. Fix any carpet that is loose or worn.  Avoid having throw rugs at the top or bottom of the stairs. If you do have throw rugs, attach them to the floor with carpet tape.  Make sure that you have a light switch at the top of the stairs and the bottom of the stairs. If you do not have them, ask someone to add them for you. What else can I do to help prevent falls?  Wear shoes that:  Do not have high heels.  Have rubber bottoms.  Are comfortable and fit you well.  Are closed at the toe. Do not wear sandals.  If  you use a stepladder:  Make sure that it is fully opened. Do not climb a closed stepladder.  Make sure that both sides of the stepladder are locked into place.  Ask someone to hold it for you, if possible.  Clearly mark and make sure that you can see:  Any grab bars or handrails.  First and last steps.  Where the edge of each step is.  Use tools that help you move around (mobility aids) if they are needed. These include:  Canes.  Walkers.  Scooters.  Crutches.  Turn on the lights when you go into a dark area. Replace any light bulbs as soon as they burn out.  Set up your furniture so you have a clear path. Avoid moving your furniture around.  If any of your floors are uneven, fix them.  If there are any pets around you, be aware of where they are.  Review your medicines with your doctor. Some medicines can make you feel dizzy. This can increase your chance of falling. Ask your doctor what other things that you can do to help prevent falls. This information is not intended to replace advice given to you by your health care provider. Make sure you discuss any questions you have with your health care provider. Document Released: 09/10/2009 Document Revised: 04/21/2016 Document Reviewed: 12/19/2014 Elsevier Interactive Patient Education  2017 [employer].

## 2021-02-17 NOTE — Patient Instructions (Signed)
Diabetes Mellitus and Foot Care Foot care is an important part of your health, especially when you have diabetes. Diabetes may cause you to have problems because of poor blood flow (circulation) to your feet and legs, which can cause your skin to:  Become thinner and drier.  Break more easily.  Heal more slowly.  Peel and crack. You may also have nerve damage (neuropathy) in your legs and feet, causing decreased feeling in them. This means that you may not notice minor injuries to your feet that could lead to more serious problems. Noticing and addressing any potential problems early is the best way to prevent future foot problems. How to care for your feet Foot hygiene  Wash your feet daily with warm water and mild soap. Do not use hot water. Then, pat your feet and the areas between your toes until they are completely dry. Do not soak your feet as this can dry your skin.  Trim your toenails straight across. Do not dig under them or around the cuticle. File the edges of your nails with an emery board or nail file.  Apply a moisturizing lotion or petroleum jelly to the skin on your feet and to dry, brittle toenails. Use lotion that does not contain alcohol and is unscented. Do not apply lotion between your toes.   Shoes and socks  Wear clean socks or stockings every day. Make sure they are not too tight. Do not wear knee-high stockings since they may decrease blood flow to your legs.  Wear shoes that fit properly and have enough cushioning. Always look in your shoes before you put them on to be sure there are no objects inside.  To break in new shoes, wear them for just a few hours a day. This prevents injuries on your feet. Wounds, scrapes, corns, and calluses  Check your feet daily for blisters, cuts, bruises, sores, and redness. If you cannot see the bottom of your feet, use a mirror or ask someone for help.  Do not cut corns or calluses or try to remove them with medicine.  If you  find a minor scrape, cut, or break in the skin on your feet, keep it and the skin around it clean and dry. You may clean these areas with mild soap and water. Do not clean the area with peroxide, alcohol, or iodine.  If you have a wound, scrape, corn, or callus on your foot, look at it several times a day to make sure it is healing and not infected. Check for: ? Redness, swelling, or pain. ? Fluid or blood. ? Warmth. ? Pus or a bad smell.   General tips  Do not cross your legs. This may decrease blood flow to your feet.  Do not use heating pads or hot water bottles on your feet. They may burn your skin. If you have lost feeling in your feet or legs, you may not know this is happening until it is too late.  Protect your feet from hot and cold by wearing shoes, such as at the beach or on hot pavement.  Schedule a complete foot exam at least once a year (annually) or more often if you have foot problems. Report any cuts, sores, or bruises to your health care provider immediately. Where to find more information  American Diabetes Association: www.diabetes.org  Association of Diabetes Care & Education Specialists: www.diabeteseducator.org Contact a health care provider if:  You have a medical condition that increases your risk of infection and   you have any cuts, sores, or bruises on your feet.  You have an injury that is not healing.  You have redness on your legs or feet.  You feel burning or tingling in your legs or feet.  You have pain or cramps in your legs and feet.  Your legs or feet are numb.  Your feet always feel cold.  You have pain around any toenails. Get help right away if:  You have a wound, scrape, corn, or callus on your foot and: ? You have pain, swelling, or redness that gets worse. ? You have fluid or blood coming from the wound, scrape, corn, or callus. ? Your wound, scrape, corn, or callus feels warm to the touch. ? You have pus or a bad smell coming from  the wound, scrape, corn, or callus. ? You have a fever. ? You have a red line going up your leg. Summary  Check your feet every day for blisters, cuts, bruises, sores, and redness.  Apply a moisturizing lotion or petroleum jelly to the skin on your feet and to dry, brittle toenails.  Wear shoes that fit properly and have enough cushioning.  If you have foot problems, report any cuts, sores, or bruises to your health care provider immediately.  Schedule a complete foot exam at least once a year (annually) or more often if you have foot problems. This information is not intended to replace advice given to you by your health care provider. Make sure you discuss any questions you have with your health care provider. Document Revised: 06/04/2020 Document Reviewed: 06/04/2020 Elsevier Patient Education  2021 Elsevier Inc.  

## 2021-02-17 NOTE — Progress Notes (Signed)
Earleen Newport as a Education administrator for [name], MD.,have documented all relevant documentation on the behalf of [name], MD,as directed by  [name], MD while in the presence of [name], MD. This visit occurred during the SARS-CoV-2 public health emergency.  Safety protocols were in place, including screening questions prior to the visit, additional usage of staff PPE, and extensive cleaning of exam room while observing appropriate contact time as indicated for disinfecting solutions.  Subjective:     Patient ID: Michele Wang , female    DOB: 07-11-1951 , 70 y.o.   MRN: [id number]   Chief Complaint  Patient presents with  . Diabetes  . Hypertension    HPI  She is here today for DM and blood pressure check.  She admits her sugars are going up. She also adds that she is not being as strict as she has been in the past. Sugars in upper 100s, but not in 300s as it had been in the past.   Diabetes She presents for her follow-up diabetic visit. She has type 2 diabetes mellitus. There are no hypoglycemic associated symptoms. There are no diabetic associated symptoms. Pertinent negatives for diabetes include no blurred vision and no chest pain. There are no hypoglycemic complications. Diabetic complications include nephropathy. She is following a diabetic diet. She participates in exercise intermittently.  Hypertension This is a chronic problem. The current episode started more than 1 year ago. The problem has been gradually improving since onset. The problem is controlled. Pertinent negatives include no blurred vision, chest pain, orthopnea, palpitations or shortness of breath. Risk factors for coronary artery disease include diabetes mellitus, dyslipidemia, post-menopausal state and sedentary lifestyle. The current treatment provides moderate improvement. Compliance problems include exercise.  Hypertensive end-organ damage includes kidney disease.     Past Medical  History:  Diagnosis Date  . Breast cancer ([place])   . CKD (chronic kidney disease)   . Diabetes mellitus without complication ([place])    Type II  . Hyperlipidemia   . Hypertension   . UTI (urinary tract infection)      Family History  Problem Relation Age of Onset  . Aneurysm Mother   . Healthy Father      Current Outpatient Medications:  .  atorvastatin (LIPITOR) 40 MG tablet, Take 1 tablet by mouth Monday Wed Friday, Disp: 90 tablet, Rfl: 2 .  Continuous Blood Gluc Receiver (FREESTYLE LIBRE 14 DAY READER) DEVI, 1 each by Does not apply route QID., Disp: 2 each, Rfl: 2 .  Continuous Blood Gluc Sensor (FREESTYLE LIBRE 14 DAY SENSOR) MISC, Use as directed to check blood sugars 4 times per day dx: e11.65, Disp: 2 each, Rfl: 2 .  insulin aspart (FIASP FLEXTOUCH) 100 UNIT/ML FlexTouch Pen, INJECT INTO SKIN SUBCUTANEOUS BEFORE MEALS PER SLIDING SCALE, Disp: 6 mL, Rfl: 3 .  insulin detemir (LEVEMIR FLEXTOUCH) 100 UNIT/ML FlexPen, Inject 20 units subcutaneously daily at bedtime, max dose 60 units (Patient taking differently: 22 Units. Inject 20 units subcutaneously daily at bedtime, max dose 60 units), Disp: 15 pen, Rfl: 2 .  Insulin Pen Needle (PEN NEEDLES 3/16") 31G X 5 MM MISC, Use as directed, once daily., Disp: 90 each, Rfl: 2 .  nitrofurantoin (MACRODANTIN) 100 MG capsule, Take 100 mg by mouth 2 (two) times daily. (Patient not taking: Reported on 02/17/2021), Disp: , Rfl:  .  Olmesartan-amLODIPine-HCTZ 40-5-25 MG TABS, TAKE ONE TABLET BY MOUTH EVERY DAY, Disp: 90 tablet, Rfl: 2 .  potassium chloride SA (KLOR-CON) 20 MEQ tablet, Take 1 tablet (20 mEq total) by mouth 2 (two) times daily. (Patient not taking: Reported on 02/17/2021), Disp: 10 tablet, Rfl: 0 .  tamoxifen (NOLVADEX) 20 MG tablet, Take 1 tablet (20 mg total) by mouth daily., Disp: 90 tablet, Rfl: 3 .  Vitamin D, Cholecalciferol, 50 MCG (2000 UT) CAPS, Take 2,000 Units by mouth daily. , Disp: , Rfl:    No Known Allergies   Review  of Systems  Constitutional: Negative.   Eyes: Negative for blurred vision.  Respiratory: Negative.  Negative for shortness of breath.   Cardiovascular: Negative.  Negative for chest pain, palpitations and orthopnea.  Gastrointestinal: Negative.   Psychiatric/Behavioral: Negative.   All other systems reviewed and are negative.    Today's Vitals   02/17/21 1015  BP: 118/70  Pulse: 68  Temp: 98.5 F (36.9 C)  Weight: 162 lb 12.8 oz (73.8 kg)  Height: 5' 5.2" (1.656 m)  PainSc: 0-No pain   Body mass index is 26.93 kg/m.  Wt Readings from Last 3 Encounters:  02/17/21 162 lb 12.8 oz (73.8 kg)  02/17/21 162 lb 12.8 oz (73.8 kg)  01/11/21 165 lb (74.8 kg)   Objective:  Physical Exam Vitals and nursing note reviewed.  Constitutional:      Appearance: Normal appearance.  HENT:     Head: Normocephalic and atraumatic.     Nose:     Comments: Masked     Mouth/Throat:     Comments: Masked  Cardiovascular:     Rate and Rhythm: Normal rate and regular rhythm.     Heart sounds: Normal heart sounds.  Pulmonary:     Effort: Pulmonary effort is normal.     Breath sounds: Normal breath sounds.  Musculoskeletal:     Cervical back: Normal range of motion.  Skin:    General: Skin is warm.  Neurological:     General: No focal deficit present.     Mental Status: She is alert.  Psychiatric:        Mood and Affect: Mood normal.        Behavior: Behavior normal.         Assessment And Plan:     1. Type 2 diabetes mellitus with stage 3b chronic kidney disease, with long-term current use of insulin (HCC) Comments: She is now checking her sugars four times per day. She does have labile blood sugars. I will fax Renal labs to Dr. [name]. Encouraged to follow DM diet. - Hemoglobin A1c - CBC with Diff - Protein electrophoresis, serum - Parathyroid Hormone, Intact w/Ca - CMP14+EGFR - AMB Referral to [facility]  2. Parenchymal renal hypertension, stage 1 through stage 4  or unspecified chronic kidney disease Comments: Chronic, well controlled. Encouraged to follow low sodium diet. She agrees to CCM referral.  - AMB Referral to [facility]  3. Pure hypercholesterolemia Comments: Chronic, she reports compliance with atorvastatin. Encouraged to follow heart healthy lifestyle - clean eating, regular exercise and stress mgmt.      Patient was given opportunity to ask questions. Patient verbalized understanding of the plan and was able to repeat key elements of the plan. All questions were answered to their satisfaction.   I, [name], MD, have reviewed all documentation for this visit. The documentation on 02/20/21 for the exam, diagnosis, procedures, and orders are all accurate and complete.   IF YOU HAVE BEEN REFERRED TO A SPECIALIST, IT MAY TAKE 1-2 WEEKS TO  SCHEDULE/PROCESS THE REFERRAL. IF YOU HAVE NOT HEARD FROM US/SPECIALIST IN TWO WEEKS, PLEASE GIVE [place] A CALL AT [phone].   THE PATIENT IS ENCOURAGED TO PRACTICE SOCIAL DISTANCING DUE TO THE COVID-19 PANDEMIC.

## 2021-02-18 DIAGNOSIS — H40033 Anatomical narrow angle, bilateral: Secondary | ICD-10-CM | POA: Diagnosis not present

## 2021-02-18 DIAGNOSIS — E1137X3 Type 2 diabetes mellitus with diabetic macular edema, resolved following treatment, bilateral: Secondary | ICD-10-CM | POA: Diagnosis not present

## 2021-02-18 DIAGNOSIS — E113293 Type 2 diabetes mellitus with mild nonproliferative diabetic retinopathy without macular edema, bilateral: Secondary | ICD-10-CM | POA: Diagnosis not present

## 2021-02-18 DIAGNOSIS — H2513 Age-related nuclear cataract, bilateral: Secondary | ICD-10-CM | POA: Diagnosis not present

## 2021-02-18 LAB — HM DIABETES EYE EXAM

## 2021-02-18 NOTE — Chronic Care Management (AMB) (Addendum)
  Chronic Care Management   Note  02/18/2021 Name: Michele Wang MRN: [id number] DOB: January 01, 1951  Michele Wang is a 70 y.o. year old female who is a primary care patient of [name], MD. I reached out to Nicola Police by phone today in response to a referral sent by [name]'s PCP, [name], MD     [name] was given information about Chronic Care Management services today including:  1. CCM service includes personalized support from designated clinical staff supervised by her physician, including individualized plan of care and coordination with other care providers 2. 24/7 contact phone numbers for assistance for urgent and routine care needs. 3. Service will only be billed when office clinical staff spend 20 minutes or more in a month to coordinate care. 4. Only one practitioner may furnish and bill the service in a calendar month. 5. The patient may stop CCM services at any time (effective at the end of the month) by phone call to the office staff. 6. The patient will be responsible for cost sharing (co-pay) of up to 20% of the service fee (after annual deductible is met).  Patient agreed to services and verbal consent obtained.   Follow up plan: Telephone appointment with care management team member scheduled QAS:UORVI D 02/24/2021 and also RN CM 03/04/2021  [facility]

## 2021-02-19 LAB — PTH, INTACT AND CALCIUM: PTH: 45 pg/mL (ref 15–65)

## 2021-02-19 LAB — CMP14+EGFR
ALT: 19 IU/L (ref 0–32)
AST: 19 IU/L (ref 0–40)
Albumin/Globulin Ratio: 1.4 (ref 1.2–2.2)
Albumin: 4.6 g/dL (ref 3.8–4.8)
Alkaline Phosphatase: 57 IU/L (ref 44–121)
BUN/Creatinine Ratio: 18 (ref 12–28)
BUN: 32 mg/dL — ABNORMAL HIGH (ref 8–27)
Bilirubin Total: 0.3 mg/dL (ref 0.0–1.2)
CO2: 25 mmol/L (ref 20–29)
Calcium: 10.2 mg/dL (ref 8.7–10.3)
Chloride: 100 mmol/L (ref 96–106)
Creatinine, Ser: 1.81 mg/dL — ABNORMAL HIGH (ref 0.57–1.00)
Globulin, Total: 3.2 g/dL (ref 1.5–4.5)
Glucose: 151 mg/dL — ABNORMAL HIGH (ref 65–99)
Potassium: 4.3 mmol/L (ref 3.5–5.2)
Sodium: 140 mmol/L (ref 134–144)
Total Protein: 7.8 g/dL (ref 6.0–8.5)
eGFR: 30 mL/min/{1.73_m2} — ABNORMAL LOW (ref >59)

## 2021-02-19 LAB — PROTEIN ELECTROPHORESIS, SERUM
A/G Ratio: 1.1 (ref 0.7–1.7)
Albumin ELP: 4.1 g/dL (ref 2.9–4.4)
Alpha 1: 0.3 g/dL (ref 0.0–0.4)
Alpha 2: 0.8 g/dL (ref 0.4–1.0)
Beta: 1.1 g/dL (ref 0.7–1.3)
Gamma Globulin: 1.5 g/dL (ref 0.4–1.8)
Globulin, Total: 3.7 g/dL (ref 2.2–3.9)

## 2021-02-19 LAB — HEMOGLOBIN A1C
Est. average glucose Bld gHb Est-mCnc: 243 mg/dL
Hgb A1c MFr Bld: 10.1 % — ABNORMAL HIGH (ref 4.8–5.6)

## 2021-02-19 LAB — CBC WITH DIFFERENTIAL/PLATELET
Basophils Absolute: 0.1 10*3/uL (ref 0.0–0.2)
Basos: 1 %
EOS (ABSOLUTE): 0.2 10*3/uL (ref 0.0–0.4)
Eos: 4 %
Hematocrit: 34.7 % (ref 34.0–46.6)
Hemoglobin: 11.5 g/dL (ref 11.1–15.9)
Immature Grans (Abs): 0 10*3/uL (ref 0.0–0.1)
Immature Granulocytes: 0 %
Lymphocytes Absolute: 2.4 10*3/uL (ref 0.7–3.1)
Lymphs: 50 %
MCH: 29.7 pg (ref 26.6–33.0)
MCHC: 33.1 g/dL (ref 31.5–35.7)
MCV: 90 fL (ref 79–97)
Monocytes Absolute: 0.4 10*3/uL (ref 0.1–0.9)
Monocytes: 8 %
Neutrophils Absolute: 1.8 10*3/uL (ref 1.4–7.0)
Neutrophils: 37 %
Platelets: 270 10*3/uL (ref 150–450)
RBC: 3.87 x10E6/uL (ref 3.77–5.28)
RDW: 13.2 % (ref 11.7–15.4)
WBC: 4.9 10*3/uL (ref 3.4–10.8)

## 2021-02-22 ENCOUNTER — Encounter: Payer: Self-pay | Admitting: Internal Medicine

## 2021-02-23 ENCOUNTER — Other Ambulatory Visit: Payer: Self-pay

## 2021-02-23 ENCOUNTER — Telehealth: Payer: Self-pay

## 2021-02-23 NOTE — Chronic Care Management (AMB) (Signed)
    Chronic Care Management Pharmacy Assistant   Name: Michele Wang  MRN: [id number] DOB: 03-Feb-1951   Reason for Encounter: Medication Review/Initial Questions for Pharmacist on 02/24/2021.    Recent office visits:  02/17/21-Sanders, Bailey Mech, MD ([place]).  Recent consult visits:  None.  Hospital visits:  None in previous 6 months  Medications: Outpatient Encounter Medications as of 02/23/2021  Medication Sig  . atorvastatin (LIPITOR) 40 MG tablet Take 1 tablet by mouth Monday Wed Friday  . Continuous Blood Gluc Receiver (FREESTYLE LIBRE 14 DAY READER) DEVI 1 each by Does not apply route QID.  Marland Kitchen Continuous Blood Gluc Sensor (FREESTYLE LIBRE 14 DAY SENSOR) MISC Use as directed to check blood sugars 4 times per day dx: e11.65  . insulin aspart (FIASP FLEXTOUCH) 100 UNIT/ML FlexTouch Pen INJECT INTO SKIN SUBCUTANEOUS BEFORE MEALS PER SLIDING SCALE  . insulin detemir (LEVEMIR FLEXTOUCH) 100 UNIT/ML FlexPen Inject 20 units subcutaneously daily at bedtime, max dose 60 units (Patient taking differently: 22 Units. Inject 20 units subcutaneously daily at bedtime, max dose 60 units)  . Insulin Pen Needle (PEN NEEDLES 3/16") 31G X 5 MM MISC Use as directed, once daily.  . nitrofurantoin (MACRODANTIN) 100 MG capsule Take 100 mg by mouth 2 (two) times daily. (Patient not taking: Reported on 02/17/2021)  . Olmesartan-amLODIPine-HCTZ 40-5-25 MG TABS TAKE ONE TABLET BY MOUTH EVERY DAY  . potassium chloride SA (KLOR-CON) 20 MEQ tablet Take 1 tablet (20 mEq total) by mouth 2 (two) times daily. (Patient not taking: Reported on 02/17/2021)  . tamoxifen (NOLVADEX) 20 MG tablet Take 1 tablet (20 mg total) by mouth daily.  . Vitamin D, Cholecalciferol, 50 MCG (2000 UT) CAPS Take 2,000 Units by mouth daily.    No facility-administered encounter medications on file as of 02/23/2021.    Have you seen any other providers since your last visit? 02/17/21-Groat Jaymes Graff MD ([facility]).  Any changes in your medications or  health? No.  Any side effects from any medications? No.  Do you have an symptoms or problems not managed by your medications? Patient reports she experiences swelling in her feet that comes and goes.  Any concerns about your health right now? Patient reports she is would like to keep blood sugars stable.  Has your provider asked that you check blood pressure, blood sugar, or follow special diet at home? Patient reports she was asked to check blood pressure from time to time, check blood sugars, and she was seen by a Nutritionist. Patient is unable to recall any information of the Nutritionist.  Do you get any type of exercise on a regular basis? Patient reports she goes to the [facility] couple times a week for an hour.  Can you think of a goal you would like to reach for your health? Patient reports would like to keep her blood sugars within the normal, not too concerned about her weight.   Do you have any problems getting your medications? Patient reports no.  Is there anything that you would like to discuss during the appointment? Patient reports she is seeking patient assistance to obtain a Freestyle Libre blood glucose monitor.   Please bring medications and supplements to appointment. Patient was made aware.   Star Rating Drugs: Atorvastatin 40 MG Tablet: 84 DS, last filled on 02/17/21 at [facility].  [name] CPP Notified.  Raynelle Highland, [place] Pharmacist Assistant [phone] CCM Total Time: 42 minutes

## 2021-02-24 ENCOUNTER — Ambulatory Visit (INDEPENDENT_AMBULATORY_CARE_PROVIDER_SITE_OTHER): Payer: Medicare PPO

## 2021-02-24 DIAGNOSIS — E7800 Pure hypercholesterolemia, unspecified: Secondary | ICD-10-CM

## 2021-02-24 DIAGNOSIS — N1832 Chronic kidney disease, stage 3b: Secondary | ICD-10-CM

## 2021-02-24 DIAGNOSIS — I129 Hypertensive chronic kidney disease with stage 1 through stage 4 chronic kidney disease, or unspecified chronic kidney disease: Secondary | ICD-10-CM

## 2021-02-24 DIAGNOSIS — Z794 Long term (current) use of insulin: Secondary | ICD-10-CM

## 2021-02-24 DIAGNOSIS — E1122 Type 2 diabetes mellitus with diabetic chronic kidney disease: Secondary | ICD-10-CM

## 2021-02-24 NOTE — Progress Notes (Signed)
Chronic Care Management Pharmacy Note  02/24/2021 Name:  VALOR TURBERVILLE MRN:  [id number] DOB:  08/24/1951  Subjective: Michele Wang is an 70 y.o. year old female who is a primary patient of [name], MD.  The CCM team was consulted for assistance with disease management and care coordination needs.  Patient was born and raised in [place]. 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 Monday is her free day and doctors appointment. 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 Wednesday she is free. Thursdays she does a women  Bible study and on Friday they go out to eat and sometimes they travel. Patient is no longer taking radiation therapy, she did it for 20 weeks and is now taking Tamoxifen.   Engaged with patient by telephone for initial visit in response to provider referral for pharmacy case management and/or care coordination services.   Consent to Services:  The patient was given the following information about Chronic Care Management services today, agreed to services, and gave verbal consent: 1. CCM service includes personalized support from designated clinical staff supervised by the primary care provider, including individualized plan of care and coordination with other care providers 2. 24/7 contact phone numbers for assistance for urgent and routine care needs. 3. Service will only be billed when office clinical staff spend 20 minutes or more in a month to coordinate care. 4. Only one practitioner may furnish and bill the service in a calendar month. 5.The patient may stop CCM services at any time (effective at the end of the month) by phone call to the office staff. 6. The patient will be responsible for cost sharing (co-pay) of up to 20% of the service fee (after annual deductible is met). Patient agreed to services and consent obtained.  Patient Care Team: [name], MD as PCP - General (Internal Medicine) Kyung Rudd, MD as Consulting  Physician (Radiation Oncology) Jovita Kussmaul, MD as Consulting Physician (General Surgery) Nicholas Lose, MD as Consulting Physician (Hematology and Oncology) Zadie Rhine Clent Demark, MD as Consulting Physician (Ophthalmology) Rex Kras Claudette Stapler, RN as [place], Sharyn Blitz, [facility] (Pharmacist)  Recent office visits: 02/17/2021 PCP OV   Recent consult visits: 02/16/2021 [facility] visits: None in previous 6 months  Objective:  Lab Results  Component Value Date   CREATININE 1.81 (H) 02/17/2021   BUN 32 (H) 02/17/2021   GFRNONAA 24 (L) 09/08/2020   GFRAA 28 (L) 09/08/2020   NA 140 02/17/2021   K 4.3 02/17/2021   CALCIUM 10.2 02/17/2021   CO2 25 02/17/2021   GLUCOSE 151 (H) 02/17/2021    Lab Results  Component Value Date/Time   HGBA1C 10.1 (H) 02/17/2021 10:56 AM   HGBA1C 8.1 (H) 11/11/2020 10:30 AM   MICROALBUR 30 02/17/2021 02:00 PM   MICROALBUR 30 02/13/2020 09:32 AM    Last diabetic Eye exam:  Lab Results  Component Value Date/Time   HMDIABEYEEXA Retinopathy (A) 02/18/2021 12:00 AM    Last diabetic Foot exam: No results found for: HMDIABFOOTEX   Lab Results  Component Value Date   CHOL 155 07/13/2020   HDL 68 07/13/2020   LDLCALC 67 07/13/2020   TRIG 111 07/13/2020   CHOLHDL 2.3 07/13/2020    Hepatic Function Latest Ref Rng & Units 02/17/2021 08/27/2020 07/13/2020  Total Protein 6.0 - 8.5 g/dL 7.8 7.9 7.5  Albumin 3.8 - 4.8 g/dL 4.6 3.9 4.5  AST 0 - 40 IU/L 19  24 33  ALT 0 - 32 IU/L 19 28 31   Alk Phosphatase 44 - 121 IU/L 57 57 65  Total Bilirubin 0.0 - 1.2 mg/dL 0.3 0.9 0.3  Bilirubin, Direct 0.00 - 0.40 mg/dL - - -    Lab Results  Component Value Date/Time   TSH 1.150 05/07/2019 10:55 AM    CBC Latest Ref Rng & Units 02/17/2021 08/30/2020 08/27/2020  WBC 3.4 - 10.8 x10E3/uL 4.9 5.0 6.1  Hemoglobin 11.1 - 15.9 g/dL 11.5 12.3 12.0  Hematocrit 34.0 - 46.6 % 34.7 36.9 37.4  Platelets 150 - 450 x10E3/uL 270 235 224     Lab Results  Component Value Date/Time   VD25OH 59.2 02/13/2020 01:36 PM    Clinical ASCVD: Yes  The ASCVD Risk score Mikey Bussing DC Jr., et al., 2013) failed to calculate for the following reasons:   The patient has a prior MI or stroke diagnosis    Depression screen [facility] 2/9 02/17/2021 10/12/2020 02/14/2020  Decreased Interest 0 0 0  Down, Depressed, Hopeless 0 0 0  PHQ - 2 Score 0 0 0  Altered sleeping - - -  Tired, decreased energy - - -  Change in appetite - - -  Feeling bad or failure about yourself  - - -  Trouble concentrating - - -  Moving slowly or fidgety/restless - - -  Suicidal thoughts - - -  PHQ-9 Score - - -  Difficult doing work/chores - - -     Social History   Tobacco Use  Smoking Status Never Smoker  Smokeless Tobacco Never Used   BP Readings from Last 3 Encounters:  02/17/21 118/70  02/17/21 118/70  01/11/21 126/82   Pulse Readings from Last 3 Encounters:  02/17/21 68  02/17/21 68  01/11/21 71   Wt Readings from Last 3 Encounters:  02/17/21 162 lb 12.8 oz (73.8 kg)  02/17/21 162 lb 12.8 oz (73.8 kg)  01/11/21 165 lb (74.8 kg)   BMI Readings from Last 3 Encounters:  02/17/21 26.93 kg/m  02/17/21 26.93 kg/m  01/11/21 28.32 kg/m    Assessment/Interventions: Review of patient past medical history, allergies, medications, health status, including review of consultants reports, laboratory and other test data, was performed as part of comprehensive evaluation and provision of chronic care management services.   SDOH:  (Social Determinants of Health) assessments and interventions performed: Yes SDOH Interventions   Flowsheet Row Most Recent Value  SDOH Interventions   Financial Strain Interventions --  [patient assistance for freestyle libre]     SDOH Screenings   Alcohol Screen: Not on file  Depression (PHQ2-9): Low Risk   . PHQ-2 Score: 0  Financial Resource Strain: Medium Risk  . Difficulty of Paying Living Expenses: Somewhat hard   Food Insecurity: No Food Insecurity  . Worried About Charity fundraiser in the Last Year: Never true  . Ran Out of Food in the Last Year: Never true  Housing: Not on file  Physical Activity: Inactive  . Days of Exercise per Week: 0 days  . Minutes of Exercise per Session: 0 min  Social Connections: Not on file  Stress: No Stress Concern Present  . Feeling of Stress : Not at all  Tobacco Use: Low Risk   . Smoking Tobacco Use: Never Smoker  . Smokeless Tobacco Use: Never Used  Transportation Needs: No Transportation Needs  . Lack of Transportation (Medical): No  . Lack of Transportation (Non-Medical): No    CCM Care Plan  No Known  Allergies  Medications Reviewed Today    Reviewed by Mayford Knife, [facility] (Pharmacist) on 02/24/21 at 1123  Med List Status: <None>  Medication Order Taking? Sig Documenting Provider Last Dose Status Informant  atorvastatin (LIPITOR) 40 MG tablet [id number] Yes Take 1 tablet by mouth Monday Wed Friday [name], MD Taking Active   Continuous Blood Gluc Receiver (FREESTYLE LIBRE 14 DAY READER) DEVI [id number] Yes 1 each by Does not apply route QID. [name], MD Taking Active   Continuous Blood Gluc Sensor (FREESTYLE LIBRE [place]) [place] [id number] Yes Use as directed to check blood sugars 4 times per day dx: e11.65 [name], MD Taking Active   insulin aspart (FIASP FLEXTOUCH) 100 UNIT/ML FlexTouch Pen [id number] Yes INJECT INTO SKIN SUBCUTANEOUS BEFORE MEALS PER SLIDING SCALE Minette Brine, FNP Taking Active   insulin detemir (LEVEMIR FLEXTOUCH) 100 UNIT/ML FlexPen [id number] Yes Inject 20 units subcutaneously daily at bedtime, max dose 60 units  Patient taking differently: 22 Units. Inject 20 units subcutaneously daily at bedtime, max dose 60 units   [name], MD Taking Active   Insulin Pen Needle (PEN NEEDLES 3/16") 31G X 5 MM MISC [id number] Yes Use as directed, once daily. [name], MD Taking Active   nitrofurantoin  (MACRODANTIN) 100 MG capsule [id number]  Take 100 mg by mouth 2 (two) times daily. [provider]  Active   Olmesartan-amLODIPine-HCTZ 40-5-25 MG TABS [id number] Yes TAKE ONE TABLET BY MOUTH EVERY DAY [name], MD Taking Active   potassium chloride SA (KLOR-CON) 20 MEQ tablet [id number] Yes Take 1 tablet (20 mEq total) by mouth 2 (two) times daily. Kem Parkinson, PA-C Taking Active   tamoxifen (NOLVADEX) 20 MG tablet [id number] Yes Take 1 tablet (20 mg total) by mouth daily. Nicholas Lose, MD Taking Active   Vitamin D, Cholecalciferol, 50 MCG (2000 UT) CAPS [id number] Yes Take 2,000 Units by mouth daily.  [provider] Taking Active Self          Patient Active Problem List   Diagnosis Date Noted  . Posterior vitreous detachment of right eye 02/16/2021  . Aortic atherosclerosis ([place]) 11/28/2020  . Overweight (BMI 25.0-29.9) 11/28/2020  . Moderate nonproliferative diabetic retinopathy of both eyes without macular edema associated with type 2 diabetes mellitus ([place]) 05/19/2020  . Vitreomacular adhesion of left eye 05/19/2020  . Nuclear sclerotic cataract of both eyes 05/19/2020  . Memory deficit 02/24/2020  . Stroke (cerebrum) ([place]) 02/24/2020  . AKI (acute kidney injury) ([place])   . Thrush   . Hyperglycemia 01/10/2020  . Hypertension 01/10/2020  . Acute lower UTI 01/10/2020  . Malignant neoplasm of upper-outer quadrant of left breast in female, estrogen receptor positive ([place]) 12/16/2019  . Ductal carcinoma in situ (DCIS) of left breast 07/17/2019  . Type 2 diabetes mellitus with stage 3b chronic kidney disease, with long-term current use of insulin ([place]) 11/08/2018  . Chronic renal disease, stage III ([place]) 11/08/2018  . Hypertensive nephropathy 11/08/2018  . Pure hypercholesterolemia 11/08/2018  . Spider veins of both lower extremities 08/15/2017    Immunization History  Administered Date(s) Administered  . DTaP 08/03/2007, 10/14/2014  . Fluad Quad(high Dose  65+) 09/08/2020  . Influenza, High Dose Seasonal PF 11/06/2019  . PFIZER(Purple Top)SARS-COV-2 Vaccination 01/24/2020, 02/19/2020, 11/23/2020  . Pneumococcal Conjugate-13 03/03/2014  . Pneumococcal Polysaccharide-23 01/30/2016, 02/05/2019    Conditions to be addressed/monitored:  Hypertension, Hyperlipidemia and Diabetes  Care Plan : [place]  Updates made by Mayford Knife, [place] since 02/24/2021  12:00 AM    Problem: HTN, HLD, DM II, CKD   Priority: High  Onset Date: 02/24/2021    Long-Range Goal: Disease Management   Start Date: 02/24/2021  This Visit's Progress: On track  Priority: High  Note:    Current Barriers:  . Unable to achieve control of diabetes  . Does not adhere to prescribed medication regimen  Pharmacist Clinical Goal(s):  Marland Kitchen Patient will achieve adherence to monitoring guidelines and medication adherence to achieve therapeutic efficacy through collaboration with PharmD and provider.    Interventions: . 1:1 collaboration with [name], MD regarding development and update of comprehensive plan of care as evidenced by provider attestation and co-signature . Inter-disciplinary care team collaboration (see longitudinal plan of care) . Comprehensive medication review performed; medication list updated in electronic medical record  Hypertension (BP goal <130/80) -Controlled -Current treatment: . Olmesartan-amlodipine- hydrochlorothiazide 40-5-25 mg - take one tablet by mouth every day  -Current home readings: 118/70, 118/65  -Current dietary habits: patient reports that she is going to start limiting the amount of salty foods she has in her diet.  -Denies hypotens ive/hypertensive symptoms -Educated on Daily salt intake goal < 2300 mg; Exercise goal of 150 minutes per week; Importance of home blood pressure monitoring; Proper BP monitoring technique; -Counseled to monitor BP at home least once per month , document, and provide log at future  appointments -Recommended to continue current medication  Hyperlipidemia: (LDL goal < 70) -Controlled -Current treatment: . Atorvastatin 40 mg tablet on Monday, Wednesday and Friday  -Educated on Cholesterol goals;  Benefits of statin for ASCVD risk reduction; Importance of limiting foods high in cholesterol; Exercise goal of 150 minutes per week; -Counseled on diet and exercise extensively Recommended to continue current medication  Diabetes (A1c goal <7%) -Uncontrolled -Current medications: . Insulin aspart - Inject into the skin before meals per sliding scale o She uses it if it is over 150 and she has used it everyday  . Insulin detemir- Inject 22 units at bedtime  -Current home glucose readings . fasting glucose: 134, 108,125  . post prandial glucose: 150-160  -Denies hypoglycemic/hyperglycemic symptoms -Current meal patterns:  . breakfast: instant grits, oatmeal - with brown sugar and milk  . dinner: grilled chicken patty, broccoli and cucumbers cut up in vinegar  . snacks: patient reports that she is not eating healthy snacks, sometimes has a craving for something sweet . drinks: Ginger Ale - frequently drinking and had increases in glucose, she is going to cut back and start drinking water with lemon, drinking 3-4 cups of coffee with carnation milk and splenda  -Current [facility] [facility] [place]3 miles and she likes to go at least twice per week. She also volunteers at the [employer] at [employer] once a week.  -Patient reports that she is concerned that she has been having itching all over her body.  -Educated on A1c and blood sugar goals; Complications of diabetes including kidney damage, retinal damage, and cardiovascular disease; Prevention and management of hypoglycemic episodes; Continuous glucose monitoring; -Counseled to check feet daily and get yearly eye exams -Patient would like to focus on having one cup of coffee in the morning with  breakfast.  -Counseled on diet and exercise extensively Recommended to continue current medication  Chronic Kidney Disease -Controlled  . Lab Results  Component Value Date   CREATININE 1.81 (H) 02/17/2021   BUN 32 (H) 02/17/2021   GFRNONAA 24 (L) 09/08/2020  GFRAA 28 (L) 09/08/2020   NA 140 02/17/2021   K 4.3 02/17/2021   CALCIUM 10.2 02/17/2021   CO2 25 02/17/2021    -Current treatment  . Atorvastatin 40 mg tablet on Monday, Wednesday and Friday  . Olmesartan-HCTZ-amlodipine 40-5- 25 mg- take 1 tablet daily  -Counseled on diet and exercise extensively Recommended to continue current medication Reviewed patients current medications     Patient Goals/Self-Care Activities . Patient will:  - take medications as prescribed check glucose using CGM, document, and provide at future appointments collaborate with provider on medication access solutions  Follow Up Plan: Telephone follow up appointment with care management team member scheduled for: The patient has been provided with contact information for the care management team and has been advised to call with any health related questions or concerns.       Medication Assistance: Application for [employer]  medication assistance program. in process.  Anticipated assistance start date 03/2021.  See plan of care for additional detail. Patient has [place] insurance so will confirm if patient is eligible for patient assistance.   Patient's preferred pharmacy [facility]:  [id number] [place] [address]Phone: [phone]: [phone] [place]- [address] [place] [address]Phone: [phone]: [address]- [address]Phone: [phone]: [phone] [id number] [place] [address] [facility] [place] [place]one: [phone]: [phone] pill box? No - patient reports that she knows when to take her medication and how, so at this time she does not need a pill box.  Pt endorses 80% compliance  We discussed: Current pharmacy is preferred with insurance plan and patient is satisfied with pharmacy services Patient decided to: Continue current medication management strategy  Care Plan and Follow Up Patient Decision:  Patient agrees to Care Plan and Follow-up.  Plan: Telephone follow up appointment with care management team member scheduled for:  03/03/2021 and The patient has been provided with contact information for the care management team and has been advised to call with any health related questions or concerns.   VaOrlando PennerPharmD Clinical Pharmacist [facility] [phone]

## 2021-02-24 NOTE — Patient Instructions (Signed)
Visit Information It was great speaking with you today!  Please let me know if you have any questions about our visit.  Goals Addressed            This Visit's Progress   . Manage My Medicine       Timeframe:  Long-Range Goal Priority:  High Start Date:                             Expected End Date:                       Follow Up Date 03/03/2021   - keep a list of all the medicines I take; vitamins and herbals too - learn to read medicine labels - use a pillbox to sort medicine - use an alarm clock or phone to remind me to take my medicine    Why is this important?   . These steps will help you keep on track with your medicines.          Patient Care Plan: CCM Pharmacy Care Plan    Problem Identified: HTN, HLD, DM II, CKD   Priority: High  Onset Date: 02/24/2021    Long-Range Goal: Disease Management   Start Date: 02/24/2021  This Visit's Progress: On track  Priority: High  Note:    Current Barriers:  . Unable to achieve control of diabetes  . Does not adhere to prescribed medication regimen  Pharmacist Clinical Goal(s):  Marland Kitchen Patient will achieve adherence to monitoring guidelines and medication adherence to achieve therapeutic efficacy through collaboration with PharmD and provider.    Interventions: . 1:1 collaboration with [name], MD regarding development and update of comprehensive plan of care as evidenced by provider attestation and co-signature . Inter-disciplinary care team collaboration (see longitudinal plan of care) . Comprehensive medication review performed; medication list updated in electronic medical record  Hypertension (BP goal <130/80) -Controlled -Current treatment: . Olmesartan-amlodipine- hydrochlorothiazide 40-5-25 mg - take one tablet by mouth every day  -Current home readings: 118/70, 118/65  -Current dietary habits: patient reports that she is going to start limiting the amount of salty foods she has in her diet.  -Denies  hypotens ive/hypertensive symptoms -Educated on Daily salt intake goal < 2300 mg; Exercise goal of 150 minutes per week; Importance of home blood pressure monitoring; Proper BP monitoring technique; -Counseled to monitor BP at home least once per month , document, and provide log at future appointments -Recommended to continue current medication  Hyperlipidemia: (LDL goal < 70) -Controlled -Current treatment: . Atorvastatin 40 mg tablet on Monday, Wednesday and Friday  -Educated on Cholesterol goals;  Benefits of statin for ASCVD risk reduction; Importance of limiting foods high in cholesterol; Exercise goal of 150 minutes per week; -Counseled on diet and exercise extensively Recommended to continue current medication  Diabetes (A1c goal <7%) -Uncontrolled -Current medications: . Insulin aspart - Inject into the skin before meals per sliding scale o She uses it if it is over 150 and she has used it everyday  . Insulin detemir- Inject 22 units at bedtime  -Current home glucose readings . fasting glucose: 134, 108,125  . post prandial glucose: 150-160  -Denies hypoglycemic/hyperglycemic symptoms -Current meal patterns:  . breakfast: instant grits, oatmeal - with brown sugar and milk  . dinner: grilled chicken patty, broccoli and cucumbers cut up in vinegar  . snacks: patient reports that she is not eating  healthy snacks, sometimes has a craving for something sweet . drinks: Ginger Ale - frequently drinking and had increases in glucose, she is going to cut back and start drinking water with lemon, drinking 3-4 cups of coffee with carnation milk and splenda  -Current exercise: [facility] and [facility]-3 miles and she likes to go at least twice per week. She also volunteers at the [employer] at [employer] once a week.  -Patient reports that she is concerned that she has been having itching all over her body.  -Educated on A1c and blood sugar goals; Complications of  diabetes including kidney damage, retinal damage, and cardiovascular disease; Prevention and management of hypoglycemic episodes; Continuous glucose monitoring; -Counseled to check feet daily and get yearly eye exams -Patient would like to focus on having one cup of coffee in the morning with breakfast.  -Counseled on diet and exercise extensively Recommended to continue current medication  Chronic Kidney Disease -Controlled  . Lab Results  Component Value Date   CREATININE 1.81 (H) 02/17/2021   BUN 32 (H) 02/17/2021   GFRNONAA 24 (L) 09/08/2020   GFRAA 28 (L) 09/08/2020   NA 140 02/17/2021   K 4.3 02/17/2021   CALCIUM 10.2 02/17/2021   CO2 25 02/17/2021    -Current treatment  . Atorvastatin 40 mg tablet on Monday, Wednesday and Friday  . Olmesartan-HCTZ-amlodipine 40-5- 25 mg- take 1 tablet daily  -Counseled on diet and exercise extensively Recommended to continue current medication Reviewed patients current medications     Patient Goals/Self-Care Activities . Patient will:  - take medications as prescribed check glucose using CGM, document, and provide at future appointments collaborate with provider on medication access solutions  Follow Up Plan: Telephone follow up appointment with care management team member scheduled for: The patient has been provided with contact information for the care management team and has been advised to call with any health related questions or concerns.       Patient agreed to services and verbal consent obtained.   The patient verbalized understanding of instructions, educational materials, and care plan provided today and agreed to receive a mailed copy of patient instructions, educational materials, and care plan.   [name], PharmD Clinical Pharmacist [facility] [phone]

## 2021-03-02 ENCOUNTER — Telehealth: Payer: Self-pay

## 2021-03-02 NOTE — Progress Notes (Signed)
03/02/21-Called patient to remind her of CCM Call appointment with [name], CPP on 03/03/21 at 8:30 AM. Patient voiced understanding. Patient aware to have medications and supplements near during phone visit.  No concerns voiced at this time.  [name], CPP Notified.   Raynelle Highland, [place] Pharmacist Assistant [phone] CCM Total Time: 9 minutes

## 2021-03-03 ENCOUNTER — Telehealth: Payer: Self-pay

## 2021-03-03 ENCOUNTER — Ambulatory Visit (INDEPENDENT_AMBULATORY_CARE_PROVIDER_SITE_OTHER): Payer: Medicare PPO

## 2021-03-03 DIAGNOSIS — E782 Mixed hyperlipidemia: Secondary | ICD-10-CM

## 2021-03-03 DIAGNOSIS — Z794 Long term (current) use of insulin: Secondary | ICD-10-CM | POA: Diagnosis not present

## 2021-03-03 DIAGNOSIS — N1832 Chronic kidney disease, stage 3b: Secondary | ICD-10-CM | POA: Diagnosis not present

## 2021-03-03 DIAGNOSIS — E7800 Pure hypercholesterolemia, unspecified: Secondary | ICD-10-CM | POA: Diagnosis not present

## 2021-03-03 DIAGNOSIS — I700 Atherosclerosis of aorta: Secondary | ICD-10-CM

## 2021-03-03 DIAGNOSIS — E1122 Type 2 diabetes mellitus with diabetic chronic kidney disease: Secondary | ICD-10-CM

## 2021-03-03 DIAGNOSIS — I10 Essential (primary) hypertension: Secondary | ICD-10-CM | POA: Diagnosis not present

## 2021-03-03 NOTE — Progress Notes (Signed)
 Chronic Care Management Pharmacy Note  03/10/2021 Name:  Michele Wang MRN:  [id number] DOB:  08/15/1951  Subjective: Michele Wang is an 69 y.o. year old female who is a primary patient of Sanders, Robyn, MD.  The CCM team was consulted for assistance with disease management and care coordination needs.    Engaged with patient by telephone for follow up visit in response to provider referral for pharmacy case management and/or care coordination services.   Consent to Services:  The patient was given information about Chronic Care Management services, agreed to services, and gave verbal consent prior to initiation of services.  Please see initial visit note for detailed documentation.   Patient Care Team: Sanders, Robyn, MD as PCP - General (Internal Medicine) Moody, John, MD as Consulting Physician (Radiation Oncology) Toth, Paul III, MD as Consulting Physician (General Surgery) Gudena, Vinay, MD as Consulting Physician (Hematology and Oncology) Rankin, Gary A, MD as Consulting Physician (Ophthalmology) Little, Angel L, RN as [facility] Care Management Pearson, Vallie J, RPH (Pharmacist)  Recent office visits: 02/17/2021 PCP OV  Recent consult visits: 02/16/2021 - Retina specialist  10/12/2020- Nutrition consult   Hospital visits: None in previous 6 months  Objective:  Lab Results  Component Value Date   CREATININE 1.81 (H) 02/17/2021   BUN 32 (H) 02/17/2021   GFRNONAA 24 (L) 09/08/2020   GFRAA 28 (L) 09/08/2020   NA 140 02/17/2021   K 4.3 02/17/2021   CALCIUM 10.2 02/17/2021   CO2 25 02/17/2021   GLUCOSE 151 (H) 02/17/2021    Lab Results  Component Value Date/Time   HGBA1C 10.1 (H) 02/17/2021 10:56 AM   HGBA1C 8.1 (H) 11/11/2020 10:30 AM   MICROALBUR 30 02/17/2021 02:00 PM   MICROALBUR 30 02/13/2020 09:32 AM    Last diabetic Eye exam:  Lab Results  Component Value Date/Time   HMDIABEYEEXA Retinopathy (A) 02/18/2021 12:00 AM    Last diabetic  Foot exam: No results found for: HMDIABFOOTEX   Lab Results  Component Value Date   CHOL 155 07/13/2020   HDL 68 07/13/2020   LDLCALC 67 07/13/2020   TRIG 111 07/13/2020   CHOLHDL 2.3 07/13/2020    Hepatic Function Latest Ref Rng & Units 02/17/2021 08/27/2020 07/13/2020  Total Protein 6.0 - 8.5 g/dL 7.8 7.9 7.5  Albumin 3.8 - 4.8 g/dL 4.6 3.9 4.5  AST 0 - 40 IU/L 19 24 33  ALT 0 - 32 IU/L 19 28 31  Alk Phosphatase 44 - 121 IU/L 57 57 65  Total Bilirubin 0.0 - 1.2 mg/dL 0.3 0.9 0.3  Bilirubin, Direct 0.00 - 0.40 mg/dL - - -    Lab Results  Component Value Date/Time   TSH 1.150 05/07/2019 10:55 AM    CBC Latest Ref Rng & Units 02/17/2021 08/30/2020 08/27/2020  WBC 3.4 - 10.8 x10E3/uL 4.9 5.0 6.1  Hemoglobin 11.1 - 15.9 g/dL 11.5 12.3 12.0  Hematocrit 34.0 - 46.6 % 34.7 36.9 37.4  Platelets 150 - 450 x10E3/uL 270 235 224    Lab Results  Component Value Date/Time   VD25OH 59.2 02/13/2020 01:36 PM    Clinical ASCVD: Yes  The ASCVD Risk score (Goff DC Jr., et al., 2013) failed to calculate for the following reasons:   The patient has a prior MI or stroke diagnosis    Depression screen PHQ 2/9 02/17/2021 10/12/2020 02/14/2020  Decreased Interest 0 0 0  Down, Depressed, Hopeless 0 0 0  PHQ - 2 Score 0 0 0    Altered sleeping - - -  Tired, decreased energy - - -  Change in appetite - - -  Feeling bad or failure about yourself  - - -  Trouble concentrating - - -  Moving slowly or fidgety/restless - - -  Suicidal thoughts - - -  PHQ-9 Score - - -  Difficult doing work/chores - - -     Social History   Tobacco Use  Smoking Status Never Smoker  Smokeless Tobacco Never Used   BP Readings from Last 3 Encounters:  02/17/21 118/70  02/17/21 118/70  01/11/21 126/82   Pulse Readings from Last 3 Encounters:  02/17/21 68  02/17/21 68  01/11/21 71   Wt Readings from Last 3 Encounters:  02/17/21 162 lb 12.8 oz (73.8 kg)  02/17/21 162 lb 12.8 oz (73.8 kg)  01/11/21 165 lb  (74.8 kg)   BMI Readings from Last 3 Encounters:  02/17/21 26.93 kg/m  02/17/21 26.93 kg/m  01/11/21 28.32 kg/m    Assessment/Interventions: Review of patient past medical history, allergies, medications, health status, including review of consultants reports, laboratory and other test data, was performed as part of comprehensive evaluation and provision of chronic care management services.   SDOH:  (Social Determinants of Health) assessments and interventions performed: No  SDOH Screenings   Alcohol Screen: Not on file  Depression (PHQ2-9): Low Risk   . PHQ-2 Score: 0  Financial Resource Strain: Medium Risk  . Difficulty of Paying Living Expenses: Somewhat hard  Food Insecurity: No Food Insecurity  . Worried About Running Out of Food in the Last Year: Never true  . Ran Out of Food in the Last Year: Never true  Housing: Not on file  Physical Activity: Inactive  . Days of Exercise per Week: 0 days  . Minutes of Exercise per Session: 0 min  Social Connections: Not on file  Stress: No Stress Concern Present  . Feeling of Stress : Not at all  Tobacco Use: Low Risk   . Smoking Tobacco Use: Never Smoker  . Smokeless Tobacco Use: Never Used  Transportation Needs: No Transportation Needs  . Lack of Transportation (Medical): No  . Lack of Transportation (Non-Medical): No    CCM Care Plan  No Known Allergies  Medications Reviewed Today    Reviewed by Pearson, Vallie J, RPH (Pharmacist) on 03/08/21 at 1615  Med List Status: <None>  Medication Order Taking? Sig Documenting Provider Last Dose Status Informant  atorvastatin (LIPITOR) 40 MG tablet [id number] No Take 1 tablet by mouth Monday Wed Friday Sanders, Robyn, MD Taking Active   Continuous Blood Gluc Receiver (FREESTYLE LIBRE 14 DAY READER) DEVI [id number] No 1 each by Does not apply route QID. Sanders, Robyn, MD Taking Active   Continuous Blood Gluc Sensor (FREESTYLE LIBRE 14 DAY SENSOR) MISC [id number] No Use as directed to  check blood sugars 4 times per day dx: e11.65 Sanders, Robyn, MD Taking Active   insulin aspart (FIASP FLEXTOUCH) 100 UNIT/ML FlexTouch Pen [id number] No INJECT INTO SKIN SUBCUTANEOUS BEFORE MEALS PER SLIDING SCALE Abelson, Janece, FNP Taking Active   insulin detemir (LEVEMIR FLEXTOUCH) 100 UNIT/ML FlexPen [id number] No Inject 20 units subcutaneously daily at bedtime, max dose 60 units  Patient taking differently: 22 Units. Inject 20 units subcutaneously daily at bedtime, max dose 60 units   Sanders, Robyn, MD Taking Active   Insulin Pen Needle (PEN NEEDLES 3/16") 31G X 5 MM MISC [id number] No Use as directed, once daily. Sanders, Robyn, MD Taking Active   nitrofurantoin (  MACRODANTIN) 100 MG capsule [id number] No Take 100 mg by mouth 2 (two) times daily. [provider] Not Taking Active   Olmesartan-amLODIPine-HCTZ 40-5-25 MG TABS [id number] No TAKE ONE TABLET BY MOUTH EVERY DAY [name], MD Taking Active   potassium chloride SA (KLOR-CON) 20 MEQ tablet [id number] No Take 1 tablet (20 mEq total) by mouth 2 (two) times daily. Kem Parkinson, PA-C Taking Active   tamoxifen (NOLVADEX) 20 MG tablet [id number] No Take 1 tablet (20 mg total) by mouth daily. Nicholas Lose, MD Taking Active   Vitamin D, Cholecalciferol, 50 MCG (2000 UT) CAPS [id number] No Take 2,000 Units by mouth daily.  [provider] Taking Active Self          Patient Active Problem List   Diagnosis Date Noted  . Posterior vitreous detachment of right eye 02/16/2021  . Aortic atherosclerosis ([place]) 11/28/2020  . Overweight (BMI 25.0-29.9) 11/28/2020  . Moderate nonproliferative diabetic retinopathy of both eyes without macular edema associated with type 2 diabetes mellitus ([place]) 05/19/2020  . Vitreomacular adhesion of left eye 05/19/2020  . Nuclear sclerotic cataract of both eyes 05/19/2020  . Memory deficit 02/24/2020  . Stroke (cerebrum) ([place]) 02/24/2020  . AKI (acute kidney injury) ([place])   . Thrush   .  Hyperglycemia 01/10/2020  . Hypertension 01/10/2020  . Acute lower UTI 01/10/2020  . Malignant neoplasm of upper-outer quadrant of left breast in female, estrogen receptor positive ([place]) 12/16/2019  . Ductal carcinoma in situ (DCIS) of left breast 07/17/2019  . Type 2 diabetes mellitus with stage 3b chronic kidney disease, with long-term current use of insulin ([place]) 11/08/2018  . Chronic renal disease, stage III ([place]) 11/08/2018  . Hypertensive nephropathy 11/08/2018  . Pure hypercholesterolemia 11/08/2018  . Spider veins of both lower extremities 08/15/2017    Immunization History  Administered Date(s) Administered  . DTaP 08/03/2007, 10/14/2014  . Fluad Quad(high Dose 65+) 09/08/2020  . Influenza, High Dose Seasonal PF 11/06/2019  . PFIZER(Purple Top)SARS-COV-2 Vaccination 01/24/2020, 02/19/2020, 11/23/2020  . Pneumococcal Conjugate-13 03/03/2014  . Pneumococcal Polysaccharide-23 01/30/2016, 02/05/2019    Conditions to be addressed/monitored:  Hyperlipidemia and Diabetes  Care Plan : [place]  Updates made by Mayford Knife, RPH since 03/10/2021 12:00 AM    Problem: HTN, HLD, DM II, CKD   Priority: High  Onset Date: 02/24/2021    Long-Range Goal: Disease Management   Start Date: 02/24/2021  Recent Progress: On track  Priority: High  Note:   Current Barriers:  . Unable to independently afford treatment regimen . Unable to independently monitor therapeutic efficacy   Pharmacist Clinical Goal(s):  Marland Kitchen Patient will achieve adherence to monitoring guidelines and medication adherence to achieve therapeutic efficacy through collaboration with PharmD and provider.    Interventions: . 1:1 collaboration with [name], MD regarding development and update of comprehensive plan of care as evidenced by provider attestation and co-signature . Inter-disciplinary care team collaboration (see longitudinal plan of care) . Comprehensive medication review performed;  medication list updated in electronic medical record  Hypertension (BP goal <130/80) -Controlled -Current treatment: . Olmesartan-amlodipine- hydrochlorothiazide 40-5-25 mg - take one tablet by mouth every day  -Current home readings: 118/70, 118/65  -Current dietary habits: patient reports that she is going to start limiting the amount of salty foods she has in her diet.  -Denies hypotens ive/hypertensive symptoms -Educated on Daily salt intake goal < 2300 mg; Exercise goal of 150 minutes per week; Importance of home blood pressure monitoring; Proper BP  monitoring technique; -Counseled to monitor BP at home least once per month , document, and provide log at future appointments -Recommended to continue current medication   Hyperlipidemia: (LDL goal < 70) -Controlled -Current treatment: . Atorvastatin 40 mg tablet on Monday, Wednesday and Friday, -Current dietary patterns: she has cut back on fried, fatty foods. She is eating salmon, baked chicken.  -[facility]  -Educated on Cholesterol goals;  Benefits of statin for ASCVD risk reduction; Importance of limiting foods high in cholesterol; -Counseled on diet and exercise extensively Recommended to continue current medication  Diabetes (A1c goal <7%) -Uncontrolled -Current medications: . Levemir 22 units at night . Fiasp only if over 150, patient has had use it about twice a day with lunch and dinner  -Current home glucose readings . fasting glucose: around 130 . post prandial glucose: 140-170  -Denies hypoglycemic/hyperglycemic symptoms -Current meal patterns:  . breakfast: spinach and turkey bacon two pieces  . dinner: chicken or salmon with vegetables . snacks: she is not eating a lot of snacks, she sometimes has popcorn in the smaller portions  . drinks: she has added more water, drinking the zero lemon drinks and mixes it with water.  -Current exercise:  [facility] an [facility].  -Educated on A1c and blood sugar goals; Complications of diabetes including kidney damage, retinal damage, and cardiovascular disease; Exercise goal of 150 minutes per week;  -CPA is going to complete [employer] application for CGM, freestyle libre to see if the cost of CGM will decrease for the patient.  -Counseled to check feet daily and get yearly eye exams -Recommended to continue current medication   Patient Goals/Self-Care Activities . Patient will:  - take medications as prescribed  Follow Up Plan: Telephone follow up appointment with care management team member scheduled for: The patient has been provided with contact information for the care management team and has been advised to call with any health related questions or concerns.       Medication Assistance: Application for Freestyle Librea  medication assistance program. in process.  Anticipated assistance start date May 2022.  See plan of care for additional detail.  Patient's preferred pharmacy is:  [id number] - [place], [place] - [address] [place] [address] Phone: [phone] Fax: [phone]  [facility] - [place], [place] - [address] [place] [address] Phone: [phone] Fax: [phone]  [facility] [address] [place] [address] Phone: [phone] Fax: [phone]  [id number] - [place], [place] - [address] [place] [address] Phone: [phone] Fax: [phone]  Uses pill box? Yes Pt endorses 100% compliance  We discussed: Benefits of medication synchronization, packaging and delivery as well as enhanced pharmacist oversight with [employer]. Patient decided to: Continue current medication management strategy  Care Plan and Follow Up Patient Decision:  Patient agrees to Care Plan and Follow-up.  Plan: Telephone  follow up appointment with care management team member scheduled for:  04/07/2021 and The patient has been provided with contact information for the care management team and has been advised to call with any health related questions or concerns.   Vallie Pearson, PharmD Clinical Pharmacist [facility] [phone]   

## 2021-03-03 NOTE — Telephone Encounter (Cosign Needed)
  Patient was not available to talk when I called her at 14:32, stated that she would be able to have a visit next week. Will reschedule patients appointment.   [name], PharmD Clinical Pharmacist [facility] [phone]

## 2021-03-04 ENCOUNTER — Telehealth: Payer: Medicare PPO

## 2021-03-04 ENCOUNTER — Ambulatory Visit: Payer: Self-pay

## 2021-03-04 DIAGNOSIS — N1832 Chronic kidney disease, stage 3b: Secondary | ICD-10-CM | POA: Diagnosis not present

## 2021-03-04 DIAGNOSIS — I10 Essential (primary) hypertension: Secondary | ICD-10-CM

## 2021-03-04 DIAGNOSIS — E782 Mixed hyperlipidemia: Secondary | ICD-10-CM

## 2021-03-04 DIAGNOSIS — E1122 Type 2 diabetes mellitus with diabetic chronic kidney disease: Secondary | ICD-10-CM

## 2021-03-04 DIAGNOSIS — E7800 Pure hypercholesterolemia, unspecified: Secondary | ICD-10-CM

## 2021-03-04 DIAGNOSIS — Z794 Long term (current) use of insulin: Secondary | ICD-10-CM

## 2021-03-09 DIAGNOSIS — E1122 Type 2 diabetes mellitus with diabetic chronic kidney disease: Secondary | ICD-10-CM | POA: Diagnosis not present

## 2021-03-09 DIAGNOSIS — D0512 Intraductal carcinoma in situ of left breast: Secondary | ICD-10-CM | POA: Diagnosis not present

## 2021-03-09 DIAGNOSIS — I129 Hypertensive chronic kidney disease with stage 1 through stage 4 chronic kidney disease, or unspecified chronic kidney disease: Secondary | ICD-10-CM | POA: Diagnosis not present

## 2021-03-09 DIAGNOSIS — R911 Solitary pulmonary nodule: Secondary | ICD-10-CM | POA: Diagnosis not present

## 2021-03-09 DIAGNOSIS — N183 Chronic kidney disease, stage 3 unspecified: Secondary | ICD-10-CM | POA: Diagnosis not present

## 2021-03-09 DIAGNOSIS — N179 Acute kidney failure, unspecified: Secondary | ICD-10-CM | POA: Diagnosis not present

## 2021-03-09 DIAGNOSIS — N200 Calculus of kidney: Secondary | ICD-10-CM | POA: Diagnosis not present

## 2021-03-10 ENCOUNTER — Telehealth: Payer: Self-pay

## 2021-03-10 NOTE — Patient Instructions (Signed)
Visit Information It was great speaking with you today!  Please let me know if you have any questions about our visit.  Goals Addressed   None     Patient Care Plan: CCM Pharmacy Care Plan    Problem Identified: HTN, HLD, DM II, CKD   Priority: High  Onset Date: 02/24/2021    Long-Range Goal: Disease Management   Start Date: 02/24/2021  Recent Progress: On track  Priority: High  Note:   Current Barriers:  . Unable to independently afford treatment regimen . Unable to independently monitor therapeutic efficacy   Pharmacist Clinical Goal(s):  Marland Kitchen Patient will achieve adherence to monitoring guidelines and medication adherence to achieve therapeutic efficacy through collaboration with PharmD and provider.    Interventions: . 1:1 collaboration with [name], MD regarding development and update of comprehensive plan of care as evidenced by provider attestation and co-signature . Inter-disciplinary care team collaboration (see longitudinal plan of care) . Comprehensive medication review performed; medication list updated in electronic medical record  Hypertension (BP goal <130/80) -Controlled -Current treatment: . Olmesartan-amlodipine- hydrochlorothiazide 40-5-25 mg - take one tablet by mouth every day  -Current home readings: 118/70, 118/65  -Current dietary habits: patient reports that she is going to start limiting the amount of salty foods she has in her diet.  -Denies hypotens ive/hypertensive symptoms -Educated on Daily salt intake goal < 2300 mg; Exercise goal of 150 minutes per week; Importance of home blood pressure monitoring; Proper BP monitoring technique; -Counseled to monitor BP at home least once per month , document, and provide log at future appointments -Recommended to continue current medication   Hyperlipidemia: (LDL goal < 70) -Controlled -Current treatment: . Atorvastatin 40 mg tablet on Monday, Wednesday and Friday, -Current dietary patterns:  she has cut back on fried, fatty foods. She is eating salmon, baked chicken.  -[facility] for one hour.  -Educated on Cholesterol goals;  Benefits of statin for ASCVD risk reduction; Importance of limiting foods high in cholesterol; -Counseled on diet and exercise extensively Recommended to continue current medication  Diabetes (A1c goal <7%) -Uncontrolled -Current medications: . Levemir 22 units at night . Fiasp only if over 150, patient has had use it about twice a day with lunch and dinner  -Current home glucose readings . fasting glucose: around 130 . post prandial glucose: 140-170  -Denies hypoglycemic/hyperglycemic symptoms -Current meal patterns:  . breakfast: spinach and [place] bacon two pieces  . dinner: chicken or salmon with vegetables . snacks: she is not eating a lot of snacks, she sometimes has popcorn in the smaller portions  . drinks: she has added more water, drinking the zero lemon drinks and mixes it with water.  -Current exercise: [facility] an [facility].  -Educated on A1c and blood sugar goals; Complications of diabetes including kidney damage, retinal damage, and cardiovascular disease; Exercise goal of 150 minutes per week;  -CPA is going to complete [employer] application for CGM, freestyle libre to see if the cost of CGM will decrease for the patient.  -Counseled to check feet daily and get yearly eye exams -Recommended to continue current medication   Patient Goals/Self-Care Activities . Patient will:  - take medications as prescribed  Follow Up Plan: Telephone follow up appointment with care management team member scheduled for: The patient has been provided with contact information for the care management team and has been advised to call  with any health related questions or concerns.       Patient agreed to services and verbal consent obtained.   The patient verbalized  understanding of instructions, educational materials, and care plan provided today and agreed to receive a mailed copy of patient instructions, educational materials, and care plan.   [name], PharmD Clinical Pharmacist [facility] [phone]

## 2021-03-16 ENCOUNTER — Telehealth: Payer: Self-pay

## 2021-03-16 NOTE — Chronic Care Management (AMB) (Signed)
    Chronic Care Management Pharmacy Assistant   Name: Michele Wang  MRN: [id number] DOB: [birth year]  Reason for Encounter: Patient Assistance Coordination   3/43/5686- Application filled out for [employer] continuous glucose monitor with [name]. Demographics, insurance, prescriptions and last office notes faxed over with application. Called patient she is aware that she will receive a cal from [facility] store regarding continuous glucose monitor supplies. [name], CPP notified.   Medications: Outpatient Encounter Medications as of 03/16/2021  Medication Sig  . atorvastatin (LIPITOR) 40 MG tablet Take 1 tablet by mouth Monday Wed Friday  . Continuous Blood Gluc Receiver (FREESTYLE LIBRE 14 DAY READER) DEVI 1 each by Does not apply route QID.  Marland Kitchen Continuous Blood Gluc Sensor (FREESTYLE LIBRE 14 DAY SENSOR) MISC Use as directed to check blood sugars 4 times per day dx: e11.65  . insulin aspart (FIASP FLEXTOUCH) 100 UNIT/ML FlexTouch Pen INJECT INTO SKIN SUBCUTANEOUS BEFORE MEALS PER SLIDING SCALE  . insulin detemir (LEVEMIR FLEXTOUCH) 100 UNIT/ML FlexPen Inject 20 units subcutaneously daily at bedtime, max dose 60 units (Patient taking differently: 22 Units. Inject 20 units subcutaneously daily at bedtime, max dose 60 units)  . Insulin Pen Needle (PEN NEEDLES 3/16") 31G X 5 MM MISC Use as directed, once daily.  . nitrofurantoin (MACRODANTIN) 100 MG capsule Take 100 mg by mouth 2 (two) times daily.  . Olmesartan-amLODIPine-HCTZ 40-5-25 MG TABS TAKE ONE TABLET BY MOUTH EVERY DAY  . potassium chloride SA (KLOR-CON) 20 MEQ tablet Take 1 tablet (20 mEq total) by mouth 2 (two) times daily.  . tamoxifen (NOLVADEX) 20 MG tablet Take 1 tablet (20 mg total) by mouth daily.  . Vitamin D, Cholecalciferol, 50 MCG (2000 UT) CAPS Take 2,000 Units by mouth daily.    No facility-administered encounter medications on file as of 03/16/2021.    Star Rating  Drugs: Atorvastatin 40 mg- Last filled 11/12/2019 for 45 day supply.  SIG: [name], [name] Pharmacist Assistant [phone]

## 2021-03-17 NOTE — Patient Instructions (Signed)
Goals Addressed    . Chronic Kidney Disease - disease progression prevented or minimized   On track    Timeframe:  Long-Range Goal Priority:  High Start Date: 03/04/21                            Expected End Date: 09/03/21  Next Scheduled Follow Up Date: 05/26/21  Self Care Activities:  . Continue to adhere to MD recommendations for CKD  . Continue to keep all scheduled follow up appointments . Take medications as directed  . Let your healthcare team know if you are unable to take your medications . Call your pharmacy for refills at least 7 days prior to running out of medication . Increase your water intake unless otherwise directed . Review mailed printed educational materials related to Kidney disease Patient Goals: - to improve and or maintain kidney function                         . Monitor and Manage My Blood Sugar-Diabetes Type 2   On track    Timeframe:  Long-Range Goal Priority:  High Start Date: 03/04/21                            Expected End Date: 09/03/21                     Follow Up Date: 05/26/21   - check blood sugar at prescribed times - check blood sugar before and after exercise - check blood sugar if I feel it is too high or too low - enter blood sugar readings and medication or insulin into daily log - take the blood sugar log to all doctor visits - take the blood sugar meter to all doctor visits    Why is this important?    Checking your blood sugar at home helps to keep it from getting very high or very low.   Writing the results in a diary or log helps the doctor know how to care for you.   Your blood sugar log should have the time, date and the results.   Also, write down the amount of insulin or other medicine that you take.   Other information, like what you ate, exercise done and how you were feeling, will also be helpful.     Notes:     . Obtain Eye Exam-Diabetes Type 2   On track    Timeframe:  Long-Range Goal Priority:  Medium Start Date:  03/04/21                            Expected End Date: 09/03/21                     Follow Up Date: 05/26/21   - keep appointment with eye doctor - schedule appointment with eye doctor    Why is this important?    Eye check-ups are important when you have diabetes.   Vision loss can be prevented.    Notes:     . Perform Foot Care-Diabetes Type 2   On track    Timeframe:  Long-Range Goal Priority:  Medium Start Date: 03/04/21  Expected End Date: 09/03/21                      Follow Up Date: 05/26/21   - check feet daily for cuts, sores or redness - keep feet up while sitting - trim toenails straight across - wash and dry feet carefully every day - wear comfortable, cotton socks - wear comfortable, well-fitting shoes    Why is this important?    Good foot care is very important when you have diabetes.   There are many things you can do to keep your feet healthy and catch a problem early.    Notes:     . Track and Manage My Blood Pressure-Hypertension   On track    Timeframe:  Long-Range Goal Priority:  Medium Start Date:  03/04/21                           Expected End Date: 09/03/21                    Follow Up Date: 05/26/21   - check blood pressure 3 times per week - choose a place to take my blood pressure (home, clinic or office, retail store) - write blood pressure results in a log or diary    Why is this important?    You won't feel high blood pressure, but it can still hurt your blood vessels.   High blood pressure can cause heart or kidney problems. It can also cause a stroke.   Making lifestyle changes like losing a [place] weight or eating less salt will help.   Checking your blood pressure at home and at different times of the day can help to control blood pressure.   If the doctor prescribes medicine remember to take it the way the doctor ordered.   Call the office if you cannot afford the medicine or if there are questions about  it.     Notes:

## 2021-03-17 NOTE — Chronic Care Management (AMB) (Signed)
Chronic Care Management   CCM RN Visit Note  03/04/2021 Name: Michele Wang MRN: [id number] DOB: 18-Nov-1951  Subjective: Michele Wang is a 70 y.o. year old female who is a primary care patient of [name], MD. The care management team was consulted for assistance with disease management and care coordination needs.    Engaged with patient by telephone for follow up visit in response to provider referral for case management and/or care coordination services.   Consent to Services:  The patient was given information about Chronic Care Management services, agreed to services, and gave verbal consent prior to initiation of services.  Please see initial visit note for detailed documentation.   Patient agreed to services and verbal consent obtained.   Assessment: Review of patient past medical history, allergies, medications, health status, including review of consultants reports, laboratory and other test data, was performed as part of comprehensive evaluation and provision of chronic care management services.   SDOH (Social Determinants of Health) assessments and interventions performed: Yes, no acute challenges identified    CCM Care Plan  No Known Allergies  Outpatient Encounter Medications as of 03/04/2021  Medication Sig  . atorvastatin (LIPITOR) 40 MG tablet Take 1 tablet by mouth Monday Wed Friday  . Continuous Blood Gluc Receiver (FREESTYLE LIBRE 14 DAY READER) DEVI 1 each by Does not apply route QID.  Marland Kitchen Continuous Blood Gluc Sensor (FREESTYLE LIBRE 14 DAY SENSOR) MISC Use as directed to check blood sugars 4 times per day dx: e11.65  . insulin aspart (FIASP FLEXTOUCH) 100 UNIT/ML FlexTouch Pen INJECT INTO SKIN SUBCUTANEOUS BEFORE MEALS PER SLIDING SCALE  . insulin detemir (LEVEMIR FLEXTOUCH) 100 UNIT/ML FlexPen Inject 20 units subcutaneously daily at bedtime, max dose 60 units (Patient taking differently: 22 Units. Inject 20 units subcutaneously daily at bedtime, max dose 60 units)   . Insulin Pen Needle (PEN NEEDLES 3/16") 31G X 5 MM MISC Use as directed, once daily.  . nitrofurantoin (MACRODANTIN) 100 MG capsule Take 100 mg by mouth 2 (two) times daily.  . Olmesartan-amLODIPine-HCTZ 40-5-25 MG TABS TAKE ONE TABLET BY MOUTH EVERY DAY  . potassium chloride SA (KLOR-CON) 20 MEQ tablet Take 1 tablet (20 mEq total) by mouth 2 (two) times daily.  . tamoxifen (NOLVADEX) 20 MG tablet Take 1 tablet (20 mg total) by mouth daily.  . Vitamin D, Cholecalciferol, 50 MCG (2000 UT) CAPS Take 2,000 Units by mouth daily.    No facility-administered encounter medications on file as of 03/04/2021.    Patient Active Problem List   Diagnosis Date Noted  . Posterior vitreous detachment of right eye 02/16/2021  . Aortic atherosclerosis ([place]) 11/28/2020  . Overweight (BMI 25.0-29.9) 11/28/2020  . Moderate nonproliferative diabetic retinopathy of both eyes without macular edema associated with type 2 diabetes mellitus ([place]) 05/19/2020  . Vitreomacular adhesion of left eye 05/19/2020  . Nuclear sclerotic cataract of both eyes 05/19/2020  . Memory deficit 02/24/2020  . Stroke (cerebrum) ([place]) 02/24/2020  . AKI (acute kidney injury) ([place])   . Thrush   . Hyperglycemia 01/10/2020  . Hypertension 01/10/2020  . Acute lower UTI 01/10/2020  . Malignant neoplasm of upper-outer quadrant of left breast in female, estrogen receptor positive ([place]) 12/16/2019  . Ductal carcinoma in situ (DCIS) of left breast 07/17/2019  . Type 2 diabetes mellitus with stage 3b chronic kidney disease, with long-term current use of insulin ([place]) 11/08/2018  . Chronic renal disease, stage III ([place]) 11/08/2018  . Hypertensive nephropathy 11/08/2018  . Pure  hypercholesterolemia 11/08/2018  . Spider veins of both lower extremities 08/15/2017    Conditions to be addressed/monitored:HTN, HLD, DM II, CKD   Care Plan : Chronic Kidney (Adult)  Updates made by [name], RN since 03/17/2021 12:00 AM    Problem: Disease  Progression   Priority: High    Long-Range Goal: Disease Progression Prevented or Minimized   Start Date: 03/04/2021  Expected End Date: 09/03/2021  This Visit's Progress: On track  Priority: High  Note:   Current Barriers:   Ineffective Self Health Maintenance  Clinical Goal(s):  Marland Kitchen Collaboration with [name], MD regarding development and update of comprehensive plan of care as evidenced by provider attestation and co-signature . Inter-disciplinary care team collaboration (see longitudinal plan of care)  patient will work with care management team to address care coordination and chronic disease management needs related to Disease Management  Educational Needs  Care Coordination  Medication Management and Education  Psychosocial Support   Interventions:  03/04/21 completed successful outbound call to patient   Evaluation of current treatment plan related to CKD Stage IV , self-management and patient's adherence to plan as established by provider.  Collaboration with [name], MD regarding development and update of comprehensive plan of care as evidenced by provider attestation       and co-signature  Inter-disciplinary care team collaboration (see longitudinal plan of care) . Provided education to patient about basic disease process related to chronic kidney disease . Review of patient status, including review of consultants reports, relevant laboratory and other test results, and medications completed. . Reviewed medications with patient and discussed importance of medication adherence . Educated on importance of increasing daily water intake to 64 oz per day unless otherwise directed . Reviewed scheduled/upcoming provider appointments including: next PCP follow up appointment scheduled for 05/20/21 @9 :15 AM . Mailed printed educational material related to Stages of Chronic Kidney diease, Eating Right with Chronic Kidney disease  Discussed plans with patient for  ongoing care management follow up and provided patient with direct contact information for care management team Self Care Activities:  . Continue to adhere to MD recommendations for CKD  . Continue to keep all scheduled follow up appointments . Take medications as directed  . Let your healthcare team know if you are unable to take your medications . Call your pharmacy for refills at least 7 days prior to running out of medication . Increase your water intake unless otherwise directed . Review mailed printed educational materials related to Kidney disease Patient Goals: - to improve and or maintain kidney function  Follow Up Plan: Telephone follow up appointment with care management team member scheduled for: 05/26/21   Care Plan : Diabetes Type 2 (Adult)  Updates made by [name], RN since 03/17/2021 12:00 AM    Problem: Glycemic Management (Diabetes, Type 2)   Priority: High    Long-Range Goal: Glycemic Management Optimized   Start Date: 03/04/2021  Expected End Date: 09/03/2021  This Visit's Progress: On track  Priority: High  Note:   Objective:  Lab Results  Component Value Date   HGBA1C 10.1 (H) 02/17/2021 .   Lab Results  Component Value Date   CREATININE 1.81 (H) 02/17/2021   CREATININE 2.07 (H) 09/08/2020   CREATININE 2.29 (H) 08/30/2020 .   Marland Kitchen No results found for: EGFR Current Barriers:  03/04/21 completed successful outbound call to patient  . Knowledge Deficits related to basic Diabetes pathophysiology and self care/management . Knowledge Deficits related to  medications used for management of diabetes Case Manager Clinical Goal(s):  . patient will demonstrate improved adherence to prescribed treatment plan for diabetes self care/management as evidenced by: daily monitoring and recording of CBG  adherence to ADA/ carb modified diet exercise 5 days/week adherence to prescribed medication regimen contacting provider for new or worsened symptoms or  questions Interventions:  . Collaboration with [name], MD regarding development and update of comprehensive plan of care as evidenced by provider attestation and co-signature . Inter-disciplinary care team collaboration (see longitudinal plan of care) . Provided education to patient about basic DM disease process . Review of patient status, including review of consultants reports, relevant laboratory and other test results, and medications completed. . Reviewed medications with patient and discussed importance of medication adherence . Educated patient on dietary and exercise recommendations; Daily glycemic control FBS 80-130, <180 after meals; 15'15' rule  . Provided patient with written educational materials related to hypo and hyperglycemia and importance of correct treatment . Advised patient, providing education and rationale, to check cbg daily before meals and at bedtime and record, calling PCP and or CCM RN for findings outside established parameters.  . Mailed printed educational materials to patient related to Diabetes Management   . Discussed plans with patient for ongoing care management follow up and provided patient with direct contact information for care management team Self-Care Activities - Self administers oral medications as prescribed Self administers insulin as prescribed Attends all scheduled provider appointments Checks blood sugars as prescribed and utilize hyper and hypoglycemia protocol as needed Adheres to prescribed ADA/carb modified Patient Goals: - check blood sugar at prescribed times - check blood sugar before and after exercise - check blood sugar if I feel it is too high or too low - enter blood sugar readings and medication or insulin into daily log - take the blood sugar log to all doctor visits - take the blood sugar meter to all doctor visits - keep appointment with eye doctor - schedule appointment with eye doctor - check feet daily for cuts,  sores or redness - keep feet up while sitting - trim toenails straight across - wash and dry feet carefully every day - wear comfortable, cotton socks - wear comfortable, well-fitting shoes Follow Up Plan: Telephone follow up appointment with care management team member scheduled for: 05/26/21   Care Plan : Hypertension (Adult)  Updates made by [name], RN since 03/17/2021 12:00 AM    Problem: Hypertension (Hypertension)   Priority: Medium    Long-Range Goal: Hypertension Monitored   Start Date: 03/04/2021  Expected End Date: 09/03/2021  This Visit's Progress: On track  Priority: Medium  Note:   Objective:  . Last practice recorded BP readings:  BP Readings from Last 3 Encounters:  02/17/21 118/70  02/17/21 118/70  01/11/21 126/82 .   Marland Kitchen Most recent eGFR/CrCl: No results found for: EGFR  No components found for: CRCL Current Barriers:  Marland Kitchen Knowledge Deficits related to basic understanding of hypertension pathophysiology and self care management . Knowledge Deficits related to understanding of medications prescribed for management of hypertension Case Manager Clinical Goal(s):  . patient will demonstrate improved adherence to prescribed treatment plan for hypertension as evidenced by taking all medications as prescribed, monitoring and recording blood pressure as directed, adhering to low sodium/DASH diet Interventions:  03/04/21 successful outbound call completed with patient  . Collaboration with [name], MD regarding development and update of comprehensive plan of care as evidenced by provider attestation and co-signature .  Inter-disciplinary care team collaboration (see longitudinal plan of care) . Evaluation of current treatment plan related to hypertension self management and patient's adherence to plan as established by provider. . Provided education to patient re: stroke prevention, s/s of heart attack and stroke, DASH diet, complications of uncontrolled blood  pressure . Reviewed medications with patient and discussed importance of compliance . Advised patient, providing education and rationale, to monitor blood pressure daily and record, calling PCP for findings outside established parameters. . Mailed printed educational materials to patient related to High Blood Pressure . Discussed plans with patient for ongoing care management follow up and provided patient with direct contact information for care management team  Self-Care Activities: - Self administers medications as prescribed Attends all scheduled provider appointments Calls provider office for new concerns, questions, or BP outside discussed parameters Checks BP and records as discussed Follows a low sodium diet/DASH diet Patient Goals: - check blood pressure 3 times per week - choose a place to take my blood pressure (home, clinic or office, retail store) - write blood pressure results in a log or diary - learn about high blood pressure  Follow Up Plan: Telephone follow up appointment with care management team member scheduled for: 05/26/21    Plan:Telephone follow up appointment with care management team member scheduled for:  05/26/21  Barb Merino, RN, BSN, CCM Care Management Coordinator [facility]  Direct Phone: [phone]

## 2021-03-30 ENCOUNTER — Ambulatory Visit: Payer: Medicare PPO

## 2021-03-30 DIAGNOSIS — E1122 Type 2 diabetes mellitus with diabetic chronic kidney disease: Secondary | ICD-10-CM

## 2021-03-30 DIAGNOSIS — N1832 Chronic kidney disease, stage 3b: Secondary | ICD-10-CM

## 2021-03-30 DIAGNOSIS — I10 Essential (primary) hypertension: Secondary | ICD-10-CM

## 2021-03-30 DIAGNOSIS — Z794 Long term (current) use of insulin: Secondary | ICD-10-CM

## 2021-03-30 NOTE — Chronic Care Management (AMB) (Signed)
Chronic Care Management    Social Work Note  03/30/2021 Name: Michele Wang MRN: [id number] DOB: Apr 20, 1951  Michele Wang is a 70 y.o. year old female who is a primary care patient of Michele Chard, MD. The CCM team was consulted to assist the patient with chronic disease management and/or care coordination needs related to: CKD III, DM II, and HTN.   Engaged with patient by telephone for initial visit in response to provider referral for social work chronic care management and care coordination services.   Consent to Services:  The patient was given the following information about Chronic Care Management services today, agreed to services, and gave verbal consent: 1. CCM service includes personalized support from designated clinical staff supervised by the primary care provider, including individualized plan of care and coordination with other care providers 2. 24/7 contact phone numbers for assistance for urgent and routine care needs. 3. Service will only be billed when office clinical staff spend 20 minutes or more in a month to coordinate care. 4. Only one practitioner may furnish and bill the service in a calendar month. 5.The patient may stop CCM services at any time (effective at the end of the month) by phone call to the office staff. 6. The patient will be responsible for cost sharing (co-pay) of up to 20% of the service fee (after annual deductible is met). Patient agreed to services and consent obtained.  Patient agreed to services and consent obtained.   Assessment: Review of patient past medical history, allergies, medications, and health status, including review of relevant consultants reports was performed today as part of a comprehensive evaluation and provision of chronic care management and care coordination services.     SDOH (Social Determinants of Health) assessments and interventions performed:  SDOH Interventions   Flowsheet Row Most Recent Value  SDOH Interventions   Food  Insecurity Interventions Intervention Not Indicated  Housing Interventions Intervention Not Indicated  Transportation Interventions Intervention Not Indicated       Advanced Directives Status: Not addressed in this encounter.  CCM Care Plan  No Known Allergies  Outpatient Encounter Medications as of 03/30/2021  Medication Sig  . atorvastatin (LIPITOR) 40 MG tablet Take 1 tablet by mouth Monday Wed Friday  . Continuous Blood Gluc Receiver (FREESTYLE LIBRE 14 DAY READER) DEVI 1 each by Does not apply route QID.  Marland Kitchen Continuous Blood Gluc Sensor (FREESTYLE LIBRE 14 DAY SENSOR) MISC Use as directed to check blood sugars 4 times per day dx: e11.65  . insulin aspart (FIASP FLEXTOUCH) 100 UNIT/ML FlexTouch Pen INJECT INTO SKIN SUBCUTANEOUS BEFORE MEALS PER SLIDING SCALE  . insulin detemir (LEVEMIR FLEXTOUCH) 100 UNIT/ML FlexPen Inject 20 units subcutaneously daily at bedtime, max dose 60 units (Patient taking differently: 22 Units. Inject 20 units subcutaneously daily at bedtime, max dose 60 units)  . Insulin Pen Needle (PEN NEEDLES 3/16") 31G X 5 MM MISC Use as directed, once daily.  . nitrofurantoin (MACRODANTIN) 100 MG capsule Take 100 mg by mouth 2 (two) times daily.  . Olmesartan-amLODIPine-HCTZ 40-5-25 MG TABS TAKE ONE TABLET BY MOUTH EVERY DAY  . potassium chloride SA (KLOR-CON) 20 MEQ tablet Take 1 tablet (20 mEq total) by mouth 2 (two) times daily.  . tamoxifen (NOLVADEX) 20 MG tablet Take 1 tablet (20 mg total) by mouth daily.  . Vitamin D, Cholecalciferol, 50 MCG (2000 UT) CAPS Take 2,000 Units by mouth daily.    No facility-administered encounter medications on file as of 03/30/2021.  Patient Active Problem List   Diagnosis Date Noted  . Posterior vitreous detachment of right eye 02/16/2021  . Aortic atherosclerosis ([place]) 11/28/2020  . Overweight (BMI 25.0-29.9) 11/28/2020  . Moderate nonproliferative diabetic retinopathy of both eyes without macular edema associated with type 2  diabetes mellitus ([place]) 05/19/2020  . Vitreomacular adhesion of left eye 05/19/2020  . Nuclear sclerotic cataract of both eyes 05/19/2020  . Memory deficit 02/24/2020  . Stroke (cerebrum) ([place]) 02/24/2020  . AKI (acute kidney injury) ([place])   . Thrush   . Hyperglycemia 01/10/2020  . Hypertension 01/10/2020  . Acute lower UTI 01/10/2020  . Malignant neoplasm of upper-outer quadrant of left breast in female, estrogen receptor positive ([place]) 12/16/2019  . Ductal carcinoma in situ (DCIS) of left breast 07/17/2019  . Type 2 diabetes mellitus with stage 3b chronic kidney disease, with long-term current use of insulin ([place]) 11/08/2018  . Chronic renal disease, stage III ([place]) 11/08/2018  . Hypertensive nephropathy 11/08/2018  . Pure hypercholesterolemia 11/08/2018  . Spider veins of both lower extremities 08/15/2017    Conditions to be addressed/monitored: HTN, DMII and CKD Stage III  There are no care plans that you recently modified to display for this patient.    Follow Up Plan: No SW follow up planned at this time. SW encouraged the patient to contact as needed for future resource needs. The patient will remain active with RN Care Manager and PharmD.      Daneen Schick, BSW, CDP Social Worker, Certified Dementia Practitioner [place] [facility] [phone]

## 2021-04-06 ENCOUNTER — Telehealth: Payer: Self-pay

## 2021-04-06 NOTE — Chronic Care Management (AMB) (Signed)
Left patient an appointment reminder with [name] ,[place] on 04-07-2021 at 1:30. Instructed patient to have all meds/supplements, any logs available for review and if unable to keep appointment to call to reschedule.  [place]  [phone]

## 2021-04-07 ENCOUNTER — Ambulatory Visit (INDEPENDENT_AMBULATORY_CARE_PROVIDER_SITE_OTHER): Payer: Medicare PPO

## 2021-04-07 DIAGNOSIS — N183 Chronic kidney disease, stage 3 unspecified: Secondary | ICD-10-CM

## 2021-04-07 DIAGNOSIS — E1122 Type 2 diabetes mellitus with diabetic chronic kidney disease: Secondary | ICD-10-CM | POA: Diagnosis not present

## 2021-04-07 DIAGNOSIS — I10 Essential (primary) hypertension: Secondary | ICD-10-CM | POA: Diagnosis not present

## 2021-04-07 DIAGNOSIS — I129 Hypertensive chronic kidney disease with stage 1 through stage 4 chronic kidney disease, or unspecified chronic kidney disease: Secondary | ICD-10-CM | POA: Diagnosis not present

## 2021-04-07 DIAGNOSIS — N1832 Chronic kidney disease, stage 3b: Secondary | ICD-10-CM | POA: Diagnosis not present

## 2021-04-07 DIAGNOSIS — Z794 Long term (current) use of insulin: Secondary | ICD-10-CM

## 2021-04-07 NOTE — Progress Notes (Signed)
Chronic Care Management Pharmacy Note  04/20/2021 Name:  Michele Wang MRN:  [id number] DOB:  [birth year]  Subjective: Michele Wang is an 70 y.o. year old female who is a primary patient of [name], MD.  The CCM team was consulted for assistance with disease management and care coordination needs.  She does Psychologist, occupational walk at the [employer] at least once a week.   Engaged with patient by telephone for follow up visit in response to provider referral for pharmacy case management and/or care coordination services.   Consent to Services:  The patient was given information about Chronic Care Management services, agreed to services, and gave verbal consent prior to initiation of services.  Please see initial visit note for detailed documentation.   Patient Care Team: [name], MD as PCP - General (Internal Medicine) Kyung Rudd, MD as Consulting Physician (Radiation Oncology) Jovita Kussmaul, MD as Consulting Physician (General Surgery) Nicholas Lose, MD as Consulting Physician (Hematology and Oncology) Hurman Horn, MD as Consulting Physician (Ophthalmology) [name], RN as [place] Management Mayford Knife, [facility] (Pharmacist)  Recent office visits: 02/17/2021 PCP OV   Recent consult visits: 02/16/2021 Opthalmology OV  10/12/2020 [facility] visits: None in previous 6 months  Objective:  Lab Results  Component Value Date   CREATININE 1.81 (H) 02/17/2021   BUN 32 (H) 02/17/2021   GFRNONAA 24 (L) 09/08/2020   GFRAA 28 (L) 09/08/2020   NA 140 02/17/2021   K 4.3 02/17/2021   CALCIUM 10.2 02/17/2021   CO2 25 02/17/2021   GLUCOSE 151 (H) 02/17/2021    Lab Results  Component Value Date/Time   HGBA1C 10.1 (H) 02/17/2021 10:56 AM   HGBA1C 8.1 (H) 11/11/2020 10:30 AM   MICROALBUR 30 02/17/2021 02:00 PM   MICROALBUR 30 02/13/2020 09:32 AM    Last diabetic Eye exam:  Lab Results  Component Value Date/Time    HMDIABEYEEXA Retinopathy (A) 02/18/2021 12:00 AM    Last diabetic Foot exam: No results found for: HMDIABFOOTEX   Lab Results  Component Value Date   CHOL 155 07/13/2020   HDL 68 07/13/2020   LDLCALC 67 07/13/2020   TRIG 111 07/13/2020   CHOLHDL 2.3 07/13/2020    Hepatic Function Latest Ref Rng & Units 02/17/2021 08/27/2020 07/13/2020  Total Protein 6.0 - 8.5 g/dL 7.8 7.9 7.5  Albumin 3.8 - 4.8 g/dL 4.6 3.9 4.5  AST 0 - 40 IU/L 19 24 33  ALT 0 - 32 IU/L _0 Alk Phosphatase 44 - 121 IU/L 57 57 65  Total Bilirubin 0.0 - 1.2 mg/dL 0.3 0.9 0.3  Bilirubin, Direct 0.00 - 0.40 mg/dL - - -    Lab Results  Component Value Date/Time   TSH 1.150 05/07/2019 10:55 AM    CBC Latest Ref Rng & Units 02/17/2021 08/30/2020 08/27/2020  WBC 3.4 - 10.8 x10E3/uL 4.9 5.0 6.1  Hemoglobin 11.1 - 15.9 g/dL 11.5 12.3 12.0  Hematocrit 34.0 - 46.6 % 34.7 36.9 37.4  Platelets 150 - 450 x10E3/uL 270 235 224    Lab Results  Component Value Date/Time   VD25OH 59.2 02/13/2020 01:36 PM    Clinical ASCVD: Yes  The ASCVD Risk score Mikey Bussing DC Jr., et al., 2013) failed to calculate for the following reasons:   The patient has a prior MI or stroke diagnosis    Depression screen [facility] 2/9 02/17/2021 10/12/2020 02/14/2020  Decreased Interest 0 0 0  Down, Depressed, Hopeless 0 0 0  PHQ - 2 Score 0 0 0  Altered sleeping - - -  Tired, decreased energy - - -  Change in appetite - - -  Feeling bad or failure about yourself  - - -  Trouble concentrating - - -  Moving slowly or fidgety/restless - - -  Suicidal thoughts - - -  PHQ-9 Score - - -  Difficult doing work/chores - - -    Social History   Tobacco Use  Smoking Status Never Smoker  Smokeless Tobacco Never Used   BP Readings from Last 3 Encounters:  02/17/21 118/70  02/17/21 118/70  01/11/21 126/82   Pulse Readings from Last 3 Encounters:  02/17/21 68  02/17/21 68  01/11/21 71   Wt Readings from Last 3 Encounters:  02/17/21 162 lb 12.8 oz  (73.8 kg)  02/17/21 162 lb 12.8 oz (73.8 kg)  01/11/21 165 lb (74.8 kg)   BMI Readings from Last 3 Encounters:  02/17/21 26.93 kg/m  02/17/21 26.93 kg/m  01/11/21 28.32 kg/m    Assessment/Interventions: Review of patient past medical history, allergies, medications, health status, including review of consultants reports, laboratory and other test data, was performed as part of comprehensive evaluation and provision of chronic care management services.   SDOH:  (Social Determinants of Health) assessments and interventions performed: No  SDOH Screenings   Alcohol Screen: Not on file  Depression (PHQ2-9): Low Risk   . PHQ-2 Score: 0  Financial Resource Strain: Medium Risk  . Difficulty of Paying Living Expenses: Somewhat hard  Food Insecurity: No Food Insecurity  . Worried About Charity fundraiser in the Last Year: Never true  . Ran Out of Food in the Last Year: Never true  Housing: Low Risk   . Last Housing Risk Score: 0  Physical Activity: Inactive  . Days of Exercise per Week: 0 days  . Minutes of Exercise per Session: 0 min  Social Connections: Not on file  Stress: No Stress Concern Present  . Feeling of Stress : Not at all  Tobacco Use: Low Risk   . Smoking Tobacco Use: Never Smoker  . Smokeless Tobacco Use: Never Used  Transportation Needs: No Transportation Needs  . Lack of Transportation (Medical): No  . Lack of Transportation (Non-Medical): No    CCM Care Plan  No Known Allergies  Medications Reviewed Today    Reviewed by Mayford Knife, RPH (Pharmacist) on 04/07/21 at 1353  Med List Status: <None>  Medication Order Taking? Sig Documenting Provider Last Dose Status Informant  atorvastatin (LIPITOR) 40 MG tablet [id number] Yes Take 1 tablet by mouth Monday Wed Friday [name], MD Taking Active   Continuous Blood Gluc Receiver (FREESTYLE LIBRE 14 DAY READER) [place] [id number] Yes 1 each by Does not apply route QID. [name], MD Taking Active    Continuous Blood Gluc Sensor (FREESTYLE LIBRE [place]) [place] [id number] Yes Use as directed to check blood sugars 4 times per day dx: e11.65 [name], MD Taking Active   insulin aspart (FIASP FLEXTOUCH) 100 UNIT/ML FlexTouch Pen [id number] Yes INJECT INTO SKIN SUBCUTANEOUS BEFORE MEALS PER SLIDING SCALE Minette Brine, FNP Taking Active   insulin detemir (LEVEMIR FLEXTOUCH) 100 UNIT/ML FlexPen [id number] Yes Inject 20 units subcutaneously daily at bedtime, max dose 60 units  Patient taking differently: 22 Units. Inject 20 units subcutaneously daily at bedtime, max dose 60 units   [name], MD Taking Active   Insulin Pen Needle (PEN NEEDLES  3/16") 31G X 5 MM MISC [id number]  Use as directed, once daily. [name], MD  Active   nitrofurantoin (MACRODANTIN) 100 MG capsule [id number] No Take 100 mg by mouth 2 (two) times daily.  Patient not taking: Reported on 04/07/2021   [provider] Not Taking Active   Olmesartan-amLODIPine-HCTZ 40-5-25 MG TABS [id number] Yes TAKE ONE TABLET BY MOUTH EVERY DAY [name], MD Taking Active   potassium chloride SA (KLOR-CON) 20 MEQ tablet [id number] No Take 1 tablet (20 mEq total) by mouth 2 (two) times daily.  Patient not taking: Reported on 04/07/2021   Kem Parkinson, PA-C Not Taking Active   tamoxifen (NOLVADEX) 20 MG tablet [id number] Yes Take 1 tablet (20 mg total) by mouth daily. Nicholas Lose, MD Taking Active   Vitamin D, Cholecalciferol, 50 MCG (2000 UT) CAPS [id number] Yes Take 2,000 Units by mouth daily.  [provider] Taking Active Self          Patient Active Problem List   Diagnosis Date Noted  . Posterior vitreous detachment of right eye 02/16/2021  . Aortic atherosclerosis ([place]) 11/28/2020  . Overweight (BMI 25.0-29.9) 11/28/2020  . Moderate nonproliferative diabetic retinopathy of both eyes without macular edema associated with type 2 diabetes mellitus ([place]) 05/19/2020  . Vitreomacular adhesion of left  eye 05/19/2020  . Nuclear sclerotic cataract of both eyes 05/19/2020  . Memory deficit 02/24/2020  . Stroke (cerebrum) ([place]) 02/24/2020  . AKI (acute kidney injury) ([place])   . Thrush   . Hyperglycemia 01/10/2020  . Hypertension 01/10/2020  . Acute lower UTI 01/10/2020  . Malignant neoplasm of upper-outer quadrant of left breast in female, estrogen receptor positive ([place]) 12/16/2019  . Ductal carcinoma in situ (DCIS) of left breast 07/17/2019  . Type 2 diabetes mellitus with stage 3b chronic kidney disease, with long-term current use of insulin ([place]) 11/08/2018  . Chronic renal disease, stage III ([place]) 11/08/2018  . Hypertensive nephropathy 11/08/2018  . Pure hypercholesterolemia 11/08/2018  . Spider veins of both lower extremities 08/15/2017    Immunization History  Administered Date(s) Administered  . DTaP 08/03/2007, 10/14/2014  . Fluad Quad(high Dose 65+) 09/08/2020  . Influenza, High Dose Seasonal PF 11/06/2019  . PFIZER(Purple Top)SARS-COV-2 Vaccination 01/24/2020, 02/19/2020, 11/23/2020  . Pneumococcal Conjugate-13 03/03/2014  . Pneumococcal Polysaccharide-23 01/30/2016, 02/05/2019    Conditions to be addressed/monitored:  Hyperlipidemia and Diabetes  Care Plan : [place]  Updates made by Mayford Knife, RPH since 04/20/2021 12:00 AM    Problem: HTN, HLD, DM II, CKD   Priority: High  Onset Date: 02/24/2021    Long-Range Goal: Disease Management   Start Date: 02/24/2021  Recent Progress: On track  Priority: High  Note:   Current Barriers:  . Unable to independently afford treatment regimen . Unable to independently monitor therapeutic efficacy   Pharmacist Clinical Goal(s):  Marland Kitchen Patient will achieve adherence to monitoring guidelines and medication adherence to achieve therapeutic efficacy through collaboration with PharmD and provider.    Interventions: . 1:1 collaboration with [name], MD regarding development and update of comprehensive  plan of care as evidenced by provider attestation and co-signature . Inter-disciplinary care team collaboration (see longitudinal plan of care) . Comprehensive medication review performed; medication list updated in electronic medical record  Hypertension (BP goal <130/80) -Controlled -Current treatment: . Olmesartan- Amlodipine-HCTZ 40-5-25 MG - taking 1 tablet in the morning.  -Current home readings: 119/80, 129/80  -Current dietary habits: no longer eating salty food -Current exercise habits: none  reported at this time  Will discuss during next office visit  -Denies hypotensive/hypertensive symptoms -Educated on BP goals and benefits of medications for prevention of heart attack, stroke and kidney damage; Daily salt intake goal < 2300 mg; Exercise goal of 150 minutes per week; Importance of home blood pressure monitoring; -Counseled to monitor BP at home once or twice per week document, and provide log at future appointments -Recommended to continue current medication   Hyperlipidemia: (LDL goal < 70) -Controlled -Current treatment: . Atorvastatin 40 mg tablet on Monday, Wednesday and Friday, -Current dietary patterns: she has cut back on fried, fatty foods. She is eating salmon, baked chicken.  -[facility]  -Educated on Cholesterol goals;  Benefits of statin for ASCVD risk reduction; Importance of limiting foods high in cholesterol; -Counseled on diet and exercise extensively Recommended to continue current medication  Diabetes (A1c goal <7%) -Uncontrolled -Current medications: . Insulin Determir- inject 22 units daily at bedtime . Insulin Aspart- inject as needed for the sliding scale  o Only when her BS goes up to 150, she has not had to do this -Current home glucose readings . fasting glucose: 99-100 -Denies hypoglycemic/hyperglycemic symptoms -Current meal patterns:  . breakfast: [place] bacon 3  slices and spinach, sometimes oatmeal - she is aware of what will cause increases in her BS and what will bring it down  . lunch: salad, couple of crackers, berries and nuts  . dinner: baked chicken, [place] wings, or seafood  . snacks: a handful of walnuts, or a vanilla wafer  . drinks: plenty of water -[facility] [facility] She is walking an hour  -Educated on Complications of diabetes including kidney damage, retinal damage, and cardiovascular disease; -Counseled to check feet daily and get yearly eye exams -Counseled on diet and exercise extensively Recommended to continue current medication -CPA is going to complete [employer] application for CGM, freestyle libre to see if the cost of CGM will decrease for the patient.  -Counseled to check feet daily and get yearly eye exams -Recommended to continue current medication   Patient Goals/Self-Care Activities . Patient will:  - take medications as prescribed  Follow Up Plan: Telephone follow up appointment with care management team member scheduled for: 05/25/2021 The patient has been provided with contact information for the care management team and has been advised to call with any health related questions or concerns.        Patient's preferred pharmacy is:  [id number] [place] [address] [address]Phone: [phone]: [phone] [facility] [place] [address] [place] [address]Phone: [phone]: [phone] [place]: [phone]: [phone] [id number] [place] [address] [place]one: [phone]: [phone] pill box? Yes Pt endorses 80% compliance  We discussed: Benefits of medication synchronization, packaging and delivery  as well as enhanced pharmacist oversight with [employer]. Patient decided to: Continue current medication management strategy  Care Plan and Follow Up Patient Decision:  Patient agrees to Care Plan and Follow-up.  Plan: The patient has been provided with contact information for the care management team and has been advised to call with any health related questions or concerns.  [name], PharmD Clinical Pharmacist [facility] [phone]

## 2021-04-13 ENCOUNTER — Telehealth: Payer: Self-pay

## 2021-04-16 NOTE — Patient Instructions (Addendum)
Visit Information It was great speaking with you today!  Please let me know if you have any questions about our visit.  Goals Addressed            This Visit's Progress   . Manage My Medicine       Timeframe:  Long-Range Goal Priority:  High Start Date:                             Expected End Date:                       Follow Up Date 05/25/2021   - keep a list of all the medicines I take; vitamins and herbals too - learn to read medicine labels - use a pillbox to sort medicine - use an alarm clock or phone to remind me to take my medicine    Why is this important?   . These steps will help you keep on track with your medicines.          Patient Care Plan: CCM Pharmacy Care Plan    Problem Identified: HTN, HLD, DM II, CKD   Priority: High  Onset Date: 02/24/2021    Long-Range Goal: Disease Management   Start Date: 02/24/2021  Recent Progress: On track  Priority: High  Note:   Current Barriers:  . Unable to independently afford treatment regimen . Unable to independently monitor therapeutic efficacy   Pharmacist Clinical Goal(s):  Marland Kitchen Patient will achieve adherence to monitoring guidelines and medication adherence to achieve therapeutic efficacy through collaboration with PharmD and provider.    Interventions: . 1:1 collaboration with [name], MD regarding development and update of comprehensive plan of care as evidenced by provider attestation and co-signature . Inter-disciplinary care team collaboration (see longitudinal plan of care) . Comprehensive medication review performed; medication list updated in electronic medical record  Hypertension (BP goal <130/80) -Controlled -Current treatment: . Olmesartan- Amlodipine-HCTZ 40-5-25 MG - taking 1 tablet in the morning.  -Current home readings: 119/80, 129/80  -Current dietary habits: no longer eating salty food -Current exercise habits: none reported at this time  Will discuss during next office visit   -Denies hypotensive/hypertensive symptoms -Educated on BP goals and benefits of medications for prevention of heart attack, stroke and kidney damage; Daily salt intake goal < 2300 mg; Exercise goal of 150 minutes per week; Importance of home blood pressure monitoring; -Counseled to monitor BP at home once or twice per week document, and provide log at future appointments -Recommended to continue current medication   Hyperlipidemia: (LDL goal < 70) -Controlled -Current treatment: . Atorvastatin 40 mg tablet on Monday, Wednesday and Friday, -Current dietary patterns: she has cut back on fried, fatty foods. She is eating salmon, baked chicken.  -[facility] [facility] one [facility].  -Educated on Cholesterol goals;  Benefits of statin for ASCVD risk reduction; Importance of limiting foods high in cholesterol; -Counseled on diet and exercise extensively Recommended to continue current medication  Diabetes (A1c goal <7%) -Uncontrolled -Current medications: . Insulin Determir- inject 22 units daily at bedtime . Insulin Aspart- inject as needed for the sliding scale  o Only when her BS goes up to 150, she has not had to do this -Current home glucose readings . fasting glucose: 99-100 -Denies hypoglycemic/hyperglycemic symptoms -Current meal patterns:  . breakfast: [place] bacon 3 slices and spinach, sometimes oatmeal -  she is aware of what will cause increases in her BS and what will bring it down  . lunch: salad, couple of crackers, berries and nuts  . dinner: baked chicken, [place] wings, or seafood  . snacks: a handful of walnuts, or a vanilla wafer  . drinks: plenty of water -[facility] [facility]. She is walking an hour  -Educated on Complications of diabetes including kidney damage, retinal damage, and cardiovascular disease; -Counseled to check feet daily and get yearly eye exams -Counseled on diet and  exercise extensively Recommended to continue current medication -CPA is going to complete [employer] application for CGM, freestyle libre to see if the cost of CGM will decrease for the patient.  -Counseled to check feet daily and get yearly eye exams -Recommended to continue current medication   Patient Goals/Self-Care Activities . Patient will:  - take medications as prescribed  Follow Up Plan: Telephone follow up appointment with care management team member scheduled for:05/25/2021 The patient has been provided with contact information for the care management team and has been advised to call with any health related questions or concerns.       Patient agreed to services and verbal consent obtained.   The patient verbalized understanding of instructions, educational materials, and care plan provided today and agreed to receive a mailed copy of patient instructions, educational materials, and care plan.   [name], PharmD Clinical Pharmacist [facility] [phone]

## 2021-04-29 DIAGNOSIS — E1122 Type 2 diabetes mellitus with diabetic chronic kidney disease: Secondary | ICD-10-CM | POA: Diagnosis not present

## 2021-05-20 ENCOUNTER — Other Ambulatory Visit: Payer: Self-pay

## 2021-05-20 ENCOUNTER — Ambulatory Visit (INDEPENDENT_AMBULATORY_CARE_PROVIDER_SITE_OTHER): Payer: Medicare PPO | Admitting: Internal Medicine

## 2021-05-20 ENCOUNTER — Encounter: Payer: Self-pay | Admitting: Internal Medicine

## 2021-05-20 VITALS — BP 110/76 | HR 74 | Temp 98.1°F | Ht 65.2 in | Wt 166.4 lb

## 2021-05-20 DIAGNOSIS — I700 Atherosclerosis of aorta: Secondary | ICD-10-CM

## 2021-05-20 DIAGNOSIS — E1122 Type 2 diabetes mellitus with diabetic chronic kidney disease: Secondary | ICD-10-CM | POA: Diagnosis not present

## 2021-05-20 DIAGNOSIS — N1832 Chronic kidney disease, stage 3b: Secondary | ICD-10-CM

## 2021-05-20 DIAGNOSIS — Z23 Encounter for immunization: Secondary | ICD-10-CM | POA: Diagnosis not present

## 2021-05-20 DIAGNOSIS — E7800 Pure hypercholesterolemia, unspecified: Secondary | ICD-10-CM

## 2021-05-20 DIAGNOSIS — R911 Solitary pulmonary nodule: Secondary | ICD-10-CM | POA: Diagnosis not present

## 2021-05-20 DIAGNOSIS — IMO0001 Reserved for inherently not codable concepts without codable children: Secondary | ICD-10-CM

## 2021-05-20 DIAGNOSIS — I129 Hypertensive chronic kidney disease with stage 1 through stage 4 chronic kidney disease, or unspecified chronic kidney disease: Secondary | ICD-10-CM

## 2021-05-20 DIAGNOSIS — N183 Chronic kidney disease, stage 3 unspecified: Secondary | ICD-10-CM

## 2021-05-20 MED ORDER — SHINGRIX 50 MCG/0.5ML IM SUSR
0.5000 mL | Freq: Once | INTRAMUSCULAR | 0 refills | Status: AC
Start: 2021-05-20 — End: 2021-05-20

## 2021-05-20 NOTE — Patient Instructions (Signed)
Mediterranean Diet A Mediterranean diet refers to food and lifestyle choices that are based on the traditions of countries located on the [place]. This way of eating has been shown to help prevent certain conditions and improve outcomes for people who have chronic diseases, like kidney disease and heart disease. What are tips for following this plan? Lifestyle  Cook and eat meals together with your family, when possible.  Drink enough fluid to keep your urine clear or pale yellow.  Be physically active every day. This includes: ? Aerobic exercise like running or swimming. ? Leisure activities like gardening, walking, or housework.  Get 7-8 hours of sleep each night.  If recommended by your health care provider, drink red wine in moderation. This means 1 glass a day for nonpregnant women and 2 glasses a day for men. A glass of wine equals 5 oz (150 mL). Reading food labels  Check the serving size of packaged foods. For foods such as rice and pasta, the serving size refers to the amount of cooked product, not dry.  Check the total fat in packaged foods. Avoid foods that have saturated fat or trans fats.  Check the ingredients list for added sugars, such as corn syrup.   Shopping  At the grocery store, buy most of your food from the areas near the walls of the store. This includes: ? Fresh fruits and vegetables (produce). ? Grains, beans, nuts, and seeds. Some of these may be available in unpackaged forms or large amounts (in bulk). ? Fresh seafood. ? Poultry and eggs. ? Low-fat dairy products.  Buy whole ingredients instead of prepackaged foods.  Buy fresh fruits and vegetables in-season from local farmers markets.  Buy frozen fruits and vegetables in resealable bags.  If you do not have access to quality fresh seafood, buy precooked frozen shrimp or canned fish, such as tuna, salmon, or sardines.  Buy small amounts of raw or cooked vegetables, salads, or olives from  the deli or salad bar at your store.  Stock your pantry so you always have certain foods on hand, such as olive oil, canned tuna, canned tomatoes, rice, pasta, and beans. Cooking  Cook foods with extra-virgin olive oil instead of using butter or other vegetable oils.  Have meat as a side dish, and have vegetables or grains as your main dish. This means having meat in small portions or adding small amounts of meat to foods like pasta or stew.  Use beans or vegetables instead of meat in common dishes like chili or lasagna.  Experiment with different cooking methods. Try roasting or broiling vegetables instead of steaming or sauteing them.  Add frozen vegetables to soups, stews, pasta, or rice.  Add nuts or seeds for added healthy fat at each meal. You can add these to yogurt, salads, or vegetable dishes.  Marinate fish or vegetables using olive oil, lemon juice, garlic, and fresh herbs. Meal planning  Plan to eat 1 vegetarian meal one day each week. Try to work up to 2 vegetarian meals, if possible.  Eat seafood 2 or more times a week.  Have healthy snacks readily available, such as: ? Vegetable sticks with hummus. ? Greek yogurt. ? Fruit and nut trail mix.  Eat balanced meals throughout the week. This includes: ? Fruit: 2-3 servings a day ? Vegetables: 4-5 servings a day ? Low-fat dairy: 2 servings a day ? Fish, poultry, or lean meat: 1 serving a day ? Beans and legumes: 2 or more servings a week ?   Nuts and seeds: 1-2 servings a day ? Whole grains: 6-8 servings a day ? Extra-virgin olive oil: 3-4 servings a day  Limit red meat and sweets to only a few servings a month   What are my food choices?  Mediterranean diet ? Recommended  Grains: Whole-grain pasta. Brown rice. Bulgar wheat. Polenta. Couscous. Whole-wheat bread. Oatmeal. Quinoa.  Vegetables: Artichokes. Beets. Broccoli. Cabbage. Carrots. Eggplant. Green beans. Chard. Kale. Spinach. Onions. Leeks. Peas. Squash.  Tomatoes. Peppers. Radishes.  Fruits: Apples. Apricots. Avocado. Berries. Bananas. Cherries. Dates. Figs. Grapes. Lemons. Melon. Oranges. Peaches. Plums. Pomegranate.  Meats and other protein foods: Beans. Almonds. Sunflower seeds. Pine nuts. Peanuts. Cod. Salmon. Scallops. Shrimp. Tuna. Tilapia. Clams. Oysters. Eggs.  Dairy: Low-fat milk. Cheese. Greek yogurt.  Beverages: Water. Red wine. Herbal tea.  Fats and oils: Extra virgin olive oil. Avocado oil. Grape seed oil.  Sweets and desserts: Greek yogurt with honey. Baked apples. Poached pears. Trail mix.  Seasoning and other foods: Basil. Cilantro. Coriander. Cumin. Mint. Parsley. Sage. Rosemary. Tarragon. Garlic. Oregano. Thyme. Pepper. Balsalmic vinegar. Tahini. Hummus. Tomato sauce. Olives. Mushrooms. ? Limit these  Grains: Prepackaged pasta or rice dishes. Prepackaged cereal with added sugar.  Vegetables: Deep fried potatoes (french fries).  Fruits: Fruit canned in syrup.  Meats and other protein foods: Beef. Pork. Lamb. Poultry with skin. Hot dogs. Bacon.  Dairy: Ice cream. Sour cream. Whole milk.  Beverages: Juice. Sugar-sweetened soft drinks. Beer. Liquor and spirits.  Fats and oils: Butter. Canola oil. Vegetable oil. Beef fat (tallow). Lard.  Sweets and desserts: Cookies. Cakes. Pies. Candy.  Seasoning and other foods: Mayonnaise. Premade sauces and marinades. The items listed may not be a complete list. Talk with your dietitian about what dietary choices are right for you. Summary  The Mediterranean diet includes both food and lifestyle choices.  Eat a variety of fresh fruits and vegetables, beans, nuts, seeds, and whole grains.  Limit the amount of red meat and sweets that you eat.  Talk with your health care provider about whether it is safe for you to drink red wine in moderation. This means 1 glass a day for nonpregnant women and 2 glasses a day for men. A glass of wine equals 5 oz (150 mL). This information  is not intended to replace advice given to you by your health care provider. Make sure you discuss any questions you have with your health care provider. Document Revised: 07/14/2016 Document Reviewed: 07/07/2016 Elsevier Patient Education  2020 Elsevier Inc.  

## 2021-05-20 NOTE — Progress Notes (Signed)
I,[name],acting as a Education administrator for [name], MD.,have documented all relevant documentation on the behalf of [name], MD,as directed by  [name], MD while in the presence of [name], MD.  This visit occurred during the SARS-CoV-2 public health emergency.  Safety protocols were in place, including screening questions prior to the visit, additional usage of staff PPE, and extensive cleaning of exam room while observing appropriate contact time as indicated for disinfecting solutions.  Subjective:     Patient ID: Michele Wang , female    DOB: 1951-06-08 , 70 y.o.   MRN: [id number]   Chief Complaint  Patient presents with   Hypertension   Diabetes   Hyperlipidemia    HPI  She is here today for DM , blood pressure  and cholesterol check.  She reports compliance with meds. She denies headaches,chest pain and shortness of breath. She states her sugars have been elevated. Admits she has been liberal with her eating and she has not been going to the [facility] since it has gotten so hot.   Hypertension This is a chronic problem. The current episode started more than 1 year ago. The problem has been gradually improving since onset. The problem is controlled. Pertinent negatives include no blurred vision, chest pain, orthopnea, palpitations or shortness of breath. Risk factors for coronary artery disease include diabetes mellitus, dyslipidemia, post-menopausal state and sedentary lifestyle. The current treatment provides moderate improvement. Compliance problems include exercise.  Hypertensive end-organ damage includes kidney disease. Identifiable causes of hypertension include chronic renal disease.  Diabetes She presents for her follow-up diabetic visit. She has type 2 diabetes mellitus. There are no hypoglycemic associated symptoms. There are no diabetic associated symptoms. Pertinent negatives for diabetes include no blurred vision and no chest pain. There are no  hypoglycemic complications. Diabetic complications include nephropathy. She is following a diabetic diet. She participates in exercise intermittently. Eye exam is current.  Hyperlipidemia This is a chronic problem. The current episode started more than 1 year ago. Exacerbating diseases include chronic renal disease and diabetes. Pertinent negatives include no chest pain or shortness of breath. Current antihyperlipidemic treatment includes statins. Compliance problems include adherence to exercise.  Risk factors for coronary artery disease include diabetes mellitus, dyslipidemia, hypertension and a sedentary lifestyle.    Past Medical History:  Diagnosis Date   Breast cancer ([place])    CKD (chronic kidney disease)    Diabetes mellitus without complication ([place])    Type II   Hyperlipidemia    Hypertension    UTI (urinary tract infection)      Family History  Problem Relation Age of Onset   Aneurysm Mother    Healthy Father      Current Outpatient Medications:    atorvastatin (LIPITOR) 40 MG tablet, Take 1 tablet by mouth Monday Wed Friday, Disp: 90 tablet, Rfl: 2   Continuous Blood Gluc Receiver (FREESTYLE LIBRE 14 DAY READER) DEVI, 1 each by Does not apply route QID., Disp: 2 each, Rfl: 2   Continuous Blood Gluc Sensor (FREESTYLE LIBRE 14 DAY SENSOR) MISC, Use as directed to check blood sugars 4 times per day dx: e11.65, Disp: 2 each, Rfl: 2   insulin aspart (FIASP FLEXTOUCH) 100 UNIT/ML FlexTouch Pen, INJECT INTO SKIN SUBCUTANEOUS BEFORE MEALS PER SLIDING SCALE, Disp: 6 mL, Rfl: 3   insulin detemir (LEVEMIR FLEXTOUCH) 100 UNIT/ML FlexPen, Inject 20 units subcutaneously daily at bedtime, max dose 60 units (Patient taking differently: 22 Units. Inject 20 units  subcutaneously daily at bedtime, max dose 60 units), Disp: 15 pen, Rfl: 2   Insulin Pen Needle (PEN NEEDLES 3/16") 31G X 5 MM MISC, Use as directed, once daily., Disp: 90 each, Rfl: 2   nitrofurantoin (MACRODANTIN) 100 MG capsule, Take  100 mg by mouth 2 (two) times daily., Disp: , Rfl:    Olmesartan-amLODIPine-HCTZ 40-5-25 MG TABS, TAKE ONE TABLET BY MOUTH EVERY DAY, Disp: 90 tablet, Rfl: 2   potassium chloride SA (KLOR-CON) 20 MEQ tablet, Take 1 tablet (20 mEq total) by mouth 2 (two) times daily., Disp: 10 tablet, Rfl: 0   tamoxifen (NOLVADEX) 20 MG tablet, Take 1 tablet (20 mg total) by mouth daily., Disp: 90 tablet, Rfl: 3   Vitamin D, Cholecalciferol, 50 MCG (2000 UT) CAPS, Take 2,000 Units by mouth daily. , Disp: , Rfl:    No Known Allergies   Review of Systems  Constitutional: Negative.   Eyes:  Negative for blurred vision.  Respiratory: Negative.  Negative for shortness of breath.   Cardiovascular: Negative.  Negative for chest pain, palpitations and orthopnea.  Gastrointestinal: Negative.   Psychiatric/Behavioral: Negative.    All other systems reviewed and are negative.   Today's Vitals   05/20/21 0929  BP: 110/76  Pulse: 74  Temp: 98.1 F (36.7 C)  TempSrc: Oral  Weight: 166 lb 6.4 oz (75.5 kg)  Height: 5' 5.2" (1.656 m)  PainSc: 0-No pain   Body mass index is 27.52 kg/m.  Wt Readings from Last 3 Encounters:  05/20/21 166 lb 6.4 oz (75.5 kg)  02/17/21 162 lb 12.8 oz (73.8 kg)  02/17/21 162 lb 12.8 oz (73.8 kg)    BP Readings from Last 3 Encounters:  05/20/21 110/76  02/17/21 118/70  02/17/21 118/70    Objective:  Physical Exam Vitals and nursing note reviewed.  Constitutional:      Appearance: Normal appearance.  HENT:     Head: Normocephalic and atraumatic.     Nose:     Comments: Masked     Mouth/Throat:     Comments: Masked  Eyes:     Extraocular Movements: Extraocular movements intact.  Cardiovascular:     Rate and Rhythm: Normal rate and regular rhythm.     Heart sounds: Normal heart sounds.  Pulmonary:     Effort: Pulmonary effort is normal.     Breath sounds: Normal breath sounds.  Skin:    General: Skin is warm.  Neurological:     General: No focal deficit present.      Mental Status: She is alert.  Psychiatric:        Mood and Affect: Mood normal.        Behavior: Behavior normal.        Assessment And Plan:     1. Parenchymal renal hypertension, stage 1 through stage 4 or unspecified chronic kidney disease Comments: Chronic, well controlled. She is encouraged to follow low sodium diet. No med changes today. I will check renal function today.  - BMP8+eGFR  2. Type 2 diabetes mellitus with stage 3b chronic kidney disease, without long-term current use of insulin ([place]) Comments: She appreciates the [place], follows sugars multiple times daily. I will check labs as listed below. Encouraged to follow dietary guidelines.  - Hemoglobin A1c - Lipid panel - BMP8+eGFR  3. Pure hypercholesterolemia Comments: Chronic, importance of statin (atorvastatin) compliance was discussed with the patient. - Lipid panel  4. Aortic atherosclerosis ([place]) Comments: Encouraged to follow heart healthy lifestyle.   5. Lung  nodule < 6cm on CT Comments: 73m nodule stable on most recent CT chest August 2021. No further eval is needed.   6. Immunization due Comments: I will send rx Shingrix to local pharmacy.    Patient was given opportunity to ask questions. Patient verbalized understanding of the plan and was able to repeat key elements of the plan. All questions were answered to their satisfaction.   I, RMaximino Greenland MD, have reviewed all documentation for this visit. The documentation on 05/20/21 for the exam, diagnosis, procedures, and orders are all accurate and complete.   IF YOU HAVE BEEN REFERRED TO A SPECIALIST, IT MAY TAKE 1-2 WEEKS TO SCHEDULE/PROCESS THE REFERRAL. IF YOU HAVE NOT HEARD FROM US/SPECIALIST IN TWO WEEKS, PLEASE GIVE [place] CALL AT [phone] X [phone].   THE PATIENT IS ENCOURAGED TO PRACTICE SOCIAL DISTANCING DUE TO THE COVID-19 PANDEMIC.

## 2021-05-24 ENCOUNTER — Telehealth: Payer: Self-pay

## 2021-05-24 NOTE — Chronic Care Management (AMB) (Signed)
Chronic Care Management Pharmacy Assistant   Name: Michele Wang  MRN: [id number] DOB: 09-Aug-1951   Reason for Encounter: Disease State/ Diabetes   Recent office visits:  05-20-2021 [name], MD. Shingrix injection given.  Recent consult visits:  None  Hospital visits:  None in previous 6 months  Medications: Outpatient Encounter Medications as of 05/24/2021  Medication Sig   atorvastatin (LIPITOR) 40 MG tablet Take 1 tablet by mouth Monday Wed Friday   Continuous Blood Gluc Receiver (FREESTYLE LIBRE 14 DAY READER) DEVI 1 each by Does not apply route QID.   Continuous Blood Gluc Sensor (FREESTYLE LIBRE 14 DAY SENSOR) MISC Use as directed to check blood sugars 4 times per day dx: e11.65   insulin aspart (FIASP FLEXTOUCH) 100 UNIT/ML FlexTouch Pen INJECT INTO SKIN SUBCUTANEOUS BEFORE MEALS PER SLIDING SCALE   insulin detemir (LEVEMIR FLEXTOUCH) 100 UNIT/ML FlexPen Inject 20 units subcutaneously daily at bedtime, max dose 60 units (Patient taking differently: 22 Units. Inject 20 units subcutaneously daily at bedtime, max dose 60 units)   Insulin Pen Needle (PEN NEEDLES 3/16") 31G X 5 MM MISC Use as directed, once daily.   nitrofurantoin (MACRODANTIN) 100 MG capsule Take 100 mg by mouth 2 (two) times daily.   Olmesartan-amLODIPine-HCTZ 40-5-25 MG TABS TAKE ONE TABLET BY MOUTH EVERY DAY   potassium chloride SA (KLOR-CON) 20 MEQ tablet Take 1 tablet (20 mEq total) by mouth 2 (two) times daily.   tamoxifen (NOLVADEX) 20 MG tablet Take 1 tablet (20 mg total) by mouth daily.   Vitamin D, Cholecalciferol, 50 MCG (2000 UT) CAPS Take 2,000 Units by mouth daily.    No facility-administered encounter medications on file as of 05/24/2021.   Recent Relevant Labs: Lab Results  Component Value Date/Time   HGBA1C 10.1 (H) 02/17/2021 10:56 AM   HGBA1C 8.1 (H) 11/11/2020 10:30 AM   MICROALBUR 30 02/17/2021 02:00 PM   MICROALBUR 30 02/13/2020 09:32 AM    Kidney Function Lab Results   Component Value Date/Time   CREATININE 1.81 (H) 02/17/2021 10:56 AM   CREATININE 2.07 (H) 09/08/2020 12:13 PM   GFRNONAA 24 (L) 09/08/2020 12:13 PM   GFRAA 28 (L) 09/08/2020 12:13 PM    Current antihyperglycemic regimen:  Insulin Determir- inject 22 units daily at bedtime Insulin Aspart- inject as needed for the sliding scale            Only when her BS goes up to 150, she has not had to do this  What recent interventions/DTPs have been made to improve glycemic control:  Educated on Complications of diabetes including kidney damage, retinal damage, and cardiovascular disease Counseled to check feet daily and get yearly eye exams Counseled on diet and exercise extensively  Have there been any recent hospitalizations or ED visits since last visit with CPP? No  Patient denies hypoglycemic symptoms Patient denies hyperglycemic symptoms How often are you checking your blood sugar? once daily  What are your blood sugars ranging?  Fasting: 146 Before meals: None After meals: None Bedtime: None  During the week, how often does your blood glucose drop below 70? Never Are you checking your feet daily/regularly? Patient states daily.  Adherence Review: Is the patient currently on a STATIN medication? Yes Is the patient currently on ACE/ARB medication? Yes Does the patient have >5 day gap between last estimated fill dates? No  NOTES: Patient states her Blood sugar fluctuates but never goes up to 150 or below 70. Sent scheduling a message to reschedule  patient's appointment with [name] CPP on 05-25-2021 at 3:15 to 07-14-2021 at 2:00.  Star Rating Drugs: Atorvastatin 40 mg- Last filled 11-12-2019 [facility] (Patient is up to date receiving medications through the [place]) Olmesartan/amlodipine/HCTZ 40-5-25 mg- Last filled 01-22-2019 [facility] (Patient is up to date receiving medications through the [facility])  [place] Pharmacist Assistant [phone]

## 2021-05-25 ENCOUNTER — Telehealth: Payer: Self-pay

## 2021-05-26 ENCOUNTER — Telehealth: Payer: Medicare PPO

## 2021-05-26 ENCOUNTER — Telehealth: Payer: Self-pay

## 2021-05-26 NOTE — Telephone Encounter (Signed)
  Care Management   Follow Up Note   05/26/2021 Name: Michele Wang MRN: [id number] DOB: 17-Apr-1951   Referred by: [name], MD Reason for referral : Chronic Care Management (RN CM Follow up call - 1st attempt )   An unsuccessful telephone outreach was attempted today. The patient was referred to the case management team for assistance with care management and care coordination.   Follow Up Plan: A HIPPA compliant phone message was left for the patient providing contact information and requesting a return call.   Barb Merino, RN, BSN, CCM Care Management Coordinator [facility]  Direct Phone: [phone]

## 2021-05-29 DIAGNOSIS — E1122 Type 2 diabetes mellitus with diabetic chronic kidney disease: Secondary | ICD-10-CM | POA: Diagnosis not present

## 2021-06-24 DIAGNOSIS — Z853 Personal history of malignant neoplasm of breast: Secondary | ICD-10-CM | POA: Diagnosis not present

## 2021-06-24 DIAGNOSIS — R922 Inconclusive mammogram: Secondary | ICD-10-CM | POA: Diagnosis not present

## 2021-06-24 LAB — HM MAMMOGRAPHY

## 2021-06-25 ENCOUNTER — Encounter: Payer: Self-pay | Admitting: Internal Medicine

## 2021-06-28 DIAGNOSIS — E1122 Type 2 diabetes mellitus with diabetic chronic kidney disease: Secondary | ICD-10-CM | POA: Diagnosis not present

## 2021-06-29 ENCOUNTER — Encounter: Payer: Self-pay | Admitting: Internal Medicine

## 2021-07-01 ENCOUNTER — Telehealth: Payer: Medicare PPO

## 2021-07-12 NOTE — Progress Notes (Signed)
Patient Care Team: [name], MD as PCP - General (Internal Medicine) Kyung Rudd, MD as Consulting Physician (Radiation Oncology) Jovita Kussmaul, MD as Consulting Physician (General Surgery) Nicholas Lose, MD as Consulting Physician (Hematology and Oncology) Zadie Rhine Clent Demark, MD as Consulting Physician (Ophthalmology) Rex Kras, Claudette Stapler, RN as [place] [facility] Pearson, Sharyn Blitz, [facility] (Pharmacist)  DIAGNOSIS:    ICD-10-CM   1. Malignant neoplasm of upper-outer quadrant of left breast in female, estrogen receptor positive ([place])  C50.412    Z17.0       SUMMARY OF ONCOLOGIC HISTORY: Oncology History  Ductal carcinoma in situ (DCIS) of left breast  07/10/2019 Cancer Staging   Staging form: Breast, AJCC 8th Edition - Clinical stage from 07/10/2019: Stage 0 (cTis (DCIS), cN0, cM0, ER+, PR+)    07/17/2019 Initial Diagnosis   Routine screening mammogram detected a 0.6cm area of calcifications in the upper outer left breast. Biopsy showed DCIS with calcifications, intermediate grade, ER+100%, PR+ 100% positive.   08/21/2019 Surgery   Left lumpectomy Marlou Starks) [phone]): low grade DCIS spanning 0.4cm, clear margins, and no invasive carcinoma   08/21/2019 Cancer Staging   Staging form: Breast, AJCC 8th Edition - Pathologic stage from 08/21/2019: Stage 0 (pTis (DCIS), pN0, cM0)    09/16/2019 - 10/11/2019 Radiation Therapy   The patient initially received a dose of 42.56 Gy in 16 fractions to the breast using whole-breast tangent fields. This was delivered using a 3-D conformal technique. The patient then received a boost to the seroma. This delivered an additional 8 Gy in 66fractions using a 3 field photon technique due to the depth of the seroma. The total dose was 50.56 Gy.   09/2019 - 09/2024 Anti-estrogen oral therapy   Tamoxifen     CHIEF COMPLIANT: Follow-up of left breast DCIS on tamoxifen  INTERVAL HISTORY: Michele Wang is a 70 y.o. with above-mentioned  history of left breast DCIS for which she underwent a lumpectomy, radiation, and is currently on antiestrogen therapy with tamoxifen. She presents to the clinic today for follow-up.  She is tolerating tamoxifen extremely well without any problems or concerns.  Denies any lumps or nodules in the breast.  ALLERGIES:  has No Known Allergies.  MEDICATIONS:  Current Outpatient Medications  Medication Sig Dispense Refill   atorvastatin (LIPITOR) 40 MG tablet Take 1 tablet by mouth Monday Wed Friday 90 tablet 2   Continuous Blood Gluc Receiver (FREESTYLE LIBRE 14 DAY READER) DEVI 1 each by Does not apply route QID. 2 each 2   Continuous Blood Gluc Sensor (FREESTYLE LIBRE 14 DAY SENSOR) MISC Use as directed to check blood sugars 4 times per day dx: e11.65 2 each 2   insulin aspart (FIASP FLEXTOUCH) 100 UNIT/ML FlexTouch Pen INJECT INTO SKIN SUBCUTANEOUS BEFORE MEALS PER SLIDING SCALE 6 mL 3   insulin detemir (LEVEMIR FLEXTOUCH) 100 UNIT/ML FlexPen Inject 20 units subcutaneously daily at bedtime, max dose 60 units (Patient taking differently: 22 Units. Inject 20 units subcutaneously daily at bedtime, max dose 60 units) 15 pen 2   Insulin Pen Needle (PEN NEEDLES 3/16") 31G X 5 MM MISC Use as directed, once daily. 90 each 2   nitrofurantoin (MACRODANTIN) 100 MG capsule Take 100 mg by mouth 2 (two) times daily.     Olmesartan-amLODIPine-HCTZ 40-5-25 MG TABS TAKE ONE TABLET BY MOUTH EVERY DAY 90 tablet 2   potassium chloride SA (KLOR-CON) 20 MEQ tablet Take 1 tablet (20 mEq total) by mouth 2 (two) times daily. 10  tablet 0   tamoxifen (NOLVADEX) 20 MG tablet Take 1 tablet (20 mg total) by mouth daily. 90 tablet 3   Vitamin D, Cholecalciferol, 50 MCG (2000 UT) CAPS Take 2,000 Units by mouth daily.      No current facility-administered medications for this visit.    PHYSICAL EXAMINATION: ECOG PERFORMANCE STATUS: 0 - Asymptomatic  Vitals:   07/13/21 1345  BP: 136/77  Pulse: 70  Resp: 18  Temp: 97.8 F  (36.6 C)  SpO2: 99%   Filed Weights   07/13/21 1345  Weight: 165 lb 3.2 oz (74.9 kg)    BREAST: No palpable masses or nodules in either right or left breasts. No palpable axillary supraclavicular or infraclavicular adenopathy no breast tenderness or nipple discharge. (exam performed in the presence of a chaperone)  LABORATORY DATA:  I have reviewed the data as listed CMP Latest Ref Rng & Units 02/17/2021 09/08/2020 08/30/2020  Glucose 65 - 99 mg/dL 151(H) 154(H) 266(H)  BUN 8 - 27 mg/dL 32(H) 35(H) 48(H)  Creatinine 0.57 - 1.00 mg/dL 1.81(H) 2.07(H) 2.29(H)  Sodium 134 - 144 mmol/L 140 139 131(L)  Potassium 3.5 - 5.2 mmol/L 4.3 3.8 3.0(L)  Chloride 96 - 106 mmol/L 100 100 95(L)  CO2 20 - 29 mmol/L 25 26 25   Calcium 8.7 - 10.3 mg/dL 10.2 10.3 9.3  Total Protein 6.0 - 8.5 g/dL 7.8 - -  Total Bilirubin 0.0 - 1.2 mg/dL 0.3 - -  Alkaline Phos 44 - 121 IU/L 57 - -  AST 0 - 40 IU/L 19 - -  ALT 0 - 32 IU/L 19 - -    Lab Results  Component Value Date   WBC 4.9 02/17/2021   HGB 11.5 02/17/2021   HCT 34.7 02/17/2021   MCV 90 02/17/2021   PLT 270 02/17/2021   NEUTROABS 1.8 02/17/2021    ASSESSMENT & PLAN:  Malignant neoplasm of upper-outer quadrant of left breast in female, estrogen receptor positive ([place]) 08/21/2019: Left lumpectomy Dr. Marlou Starks: Low-grade DCIS 0.4 cm, margins clear, ER 100%, PR 100% Tis NX stage 0   Treatment plan: 1.  Adjuvant radiation therapy 09/17/2019-10/07/2019 2.  Followed by tamoxifen for 5 years -------------------------------------------------------------------------------------------------------------------------------------------- Tamoxifen toxicities: Intermittent hot flashes but bearable   Breast cancer surveillance: 1.  Breast exam 07/13/2021: Benign 2. Mammogram 06/24/2021: [facility]: Benign   Bone density 02/27/2020: T score -1.3: Mild osteopenia I renewed her prescription for tamoxifen through [place]   Return to clinic in 1 year for follow-up    No  orders of the defined types were placed in this encounter.  The patient has a good understanding of the overall plan. she agrees with it. she will call with any problems that may develop before the next visit here.  Total time spent: 20 mins including face to face time and time spent for planning, charting and coordination of care  Rulon Eisenmenger, MD, MPH 07/13/2021  I, Thana Ates, am acting as scribe for Dr. Nicholas Lose.  I have reviewed the above documentation for accuracy and completeness, and I agree with the above.

## 2021-07-13 ENCOUNTER — Inpatient Hospital Stay: Payer: Medicare PPO | Attending: Hematology and Oncology | Admitting: Hematology and Oncology

## 2021-07-13 ENCOUNTER — Other Ambulatory Visit: Payer: Self-pay

## 2021-07-13 ENCOUNTER — Telehealth: Payer: Self-pay

## 2021-07-13 DIAGNOSIS — Z170 Estrogen receptor positive status [ER+]: Secondary | ICD-10-CM

## 2021-07-13 DIAGNOSIS — C50412 Malignant neoplasm of upper-outer quadrant of left female breast: Secondary | ICD-10-CM | POA: Diagnosis not present

## 2021-07-13 DIAGNOSIS — Z79810 Long term (current) use of selective estrogen receptor modulators (SERMs): Secondary | ICD-10-CM | POA: Diagnosis not present

## 2021-07-13 DIAGNOSIS — Z79899 Other long term (current) drug therapy: Secondary | ICD-10-CM | POA: Diagnosis not present

## 2021-07-13 MED ORDER — TAMOXIFEN CITRATE 20 MG PO TABS: 20.0000 mg | ORAL_TABLET | Freq: Every day | ORAL | 3 refills | Status: DC

## 2021-07-13 NOTE — Assessment & Plan Note (Signed)
08/21/2019: Left lumpectomy Dr. Marlou Starks: Low-grade DCIS 0.4 cm, margins clear, ER 100%, PR 100% Tis NX stage 0  Treatment plan: 1.Adjuvant radiation therapy10/20/2020-10/07/2019 2.Followed by tamoxifen for 5 years -------------------------------------------------------------------------------------------------------------------------------------------- Tamoxifen toxicities: Intermittent hot flashes but bearable  Breast cancer surveillance: 1.  Breast exam 07/13/2021: Benign 2. Mammogram 06/24/2021: [facility]  Bone density 02/27/2020: T score -1.3: Mild osteopenia I renewed her prescription for tamoxifen through [place]  Return to clinic in 1 year for follow-up

## 2021-07-13 NOTE — Chronic Care Management (AMB) (Signed)
  No answer, left message of telephone appointment with Maudry Mayhew CPP on 07-14-2021 at 2:00.Left message to have all medications, supplements, blood pressure and/or blood sugar logs available during appointment and to return call if need to reschedule.   Care Gaps: Shingrix overdue Covid booster overdue Yearly foot exam overdue Medicare wellness 02-23-2022 RAF= 1.49%  Star Rating Drug: Atorvastatin 40 mg- Last [facility]17-[facility]21 [facility] Olmesartan/amlodipine/HCTZ 40-5-25 mg- Last filled 12[facility]22-2020 [facility]  Any gaps in medications fill history? Yes  [place] Pharmacist Assistant [phone]

## 2021-07-14 ENCOUNTER — Telehealth: Payer: Self-pay | Admitting: Hematology and Oncology

## 2021-07-14 ENCOUNTER — Telehealth: Payer: Self-pay

## 2021-07-14 NOTE — Telephone Encounter (Signed)
Scheduled appt per 8/16 los - mailed reminder letter with appt date and time   

## 2021-07-15 ENCOUNTER — Telehealth: Payer: Self-pay

## 2021-07-15 NOTE — Chronic Care Management (AMB) (Signed)
Chronic Care Management Pharmacy Assistant   Name: Michele Wang  MRN: [id number] DOB: [birth year]   Reason for Encounter: Disease State/ Diabetes  Recent office visits:  None  Recent consult visits:  07-13-2021 Nicholas Lose, MD (Oncology). STOP Macrodantin 100 mg.  Hospital visits:  None in previous 6 months  Medications: Outpatient Encounter Medications as of 07/15/2021  Medication Sig   atorvastatin (LIPITOR) 40 MG tablet Take 1 tablet by mouth Monday Wed Friday   Continuous Blood Gluc Receiver (FREESTYLE LIBRE 14 DAY READER) DEVI 1 each by Does not apply route QID.   Continuous Blood Gluc Sensor (FREESTYLE LIBRE 14 DAY SENSOR) MISC Use as directed to check blood sugars 4 times per day dx: e11.65   insulin aspart (FIASP FLEXTOUCH) 100 UNIT/ML FlexTouch Pen INJECT INTO SKIN SUBCUTANEOUS BEFORE MEALS PER SLIDING SCALE   insulin detemir (LEVEMIR FLEXTOUCH) 100 UNIT/ML FlexPen Inject 20 units subcutaneously daily at bedtime, max dose 60 units (Patient taking differently: 22 Units. Inject 20 units subcutaneously daily at bedtime, max dose 60 units)   Insulin Pen Needle (PEN NEEDLES 3/16") 31G X 5 MM MISC Use as directed, once daily.   Olmesartan-amLODIPine-HCTZ 40-5-25 MG TABS TAKE ONE TABLET BY MOUTH EVERY DAY   potassium chloride SA (KLOR-CON) 20 MEQ tablet Take 1 tablet (20 mEq total) by mouth 2 (two) times daily.   tamoxifen (NOLVADEX) 20 MG tablet Take 1 tablet (20 mg total) by mouth daily.   Vitamin D, Cholecalciferol, 50 MCG (2000 UT) CAPS Take 2,000 Units by mouth daily.    No facility-administered encounter medications on file as of 07/15/2021.   Recent Relevant Labs: Lab Results  Component Value Date/Time   HGBA1C 10.1 (H) 02/17/2021 10:56 AM   HGBA1C 8.1 (H) 11/11/2020 10:30 AM   MICROALBUR 30 02/17/2021 02:00 PM   MICROALBUR 30 02/13/2020 09:32 AM    Kidney Function Lab Results  Component Value Date/Time   CREATININE 1.81 (H) 02/17/2021 10:56 AM   CREATININE  2.07 (H) 09/08/2020 12:13 PM   GFRNONAA 24 (L) 09/08/2020 12:13 PM   GFRAA 28 (L) 09/08/2020 12:13 PM    Current antihyperglycemic regimen:  Insulin Determir- inject 22 units daily at bedtime Insulin Aspart- inject as needed for the sliding scale            Only when her BS goes up to 150, she has not had to do this  What recent interventions/DTPs have been made to improve glycemic control:  Educated on Complications of diabetes including kidney damage, retinal damage, and cardiovascular disease Counseled to check feet daily and get yearly eye exams Counseled on diet and exercise extensively  Have there been any recent hospitalizations or ED visits since last visit with CPP? No  Patient denies hypoglycemic symptoms  Patient denies hyperglycemic symptoms  How often are you checking your blood sugar? once daily  What are your blood sugars ranging?  Fasting: 124 Before meals: None After meals: None Bedtime: None  During the week, how often does your blood glucose drop below 70? Never  Are you checking your feet daily/regularly? Patient states daily  Adherence Review: Is the patient currently on a STATIN medication? Yes Is the patient currently on ACE/ARB medication? Yes Does the patient have >5 day gap between last estimated fill dates? No  NOTES: Rescheduled missed appointment 07-14-2021 with [name] to October.  Care Gaps: Shingrix overdue Covid booster overdue Yearly foot exam overdue Medicare wellness 02-23-2022 RAF= 1.49%  Star Rating Drugs: Atorvastatin 40 mg-  [facility] (Patient is up to date receiving medications through the [place]) Olmesartan/amlodipine/HCTZ 40-5-25 mg- Last filled [facility]22-[facility] ([facility])  [place] Pharmacist Assistant [phone]

## 2021-07-19 ENCOUNTER — Other Ambulatory Visit: Payer: Self-pay

## 2021-07-19 ENCOUNTER — Encounter: Payer: Self-pay | Admitting: Internal Medicine

## 2021-07-19 ENCOUNTER — Ambulatory Visit (INDEPENDENT_AMBULATORY_CARE_PROVIDER_SITE_OTHER): Payer: Medicare PPO | Admitting: Internal Medicine

## 2021-07-19 VITALS — BP 132/80 | HR 76 | Temp 98.2°F | Ht 65.2 in | Wt 165.0 lb

## 2021-07-19 DIAGNOSIS — I129 Hypertensive chronic kidney disease with stage 1 through stage 4 chronic kidney disease, or unspecified chronic kidney disease: Secondary | ICD-10-CM

## 2021-07-19 DIAGNOSIS — R002 Palpitations: Secondary | ICD-10-CM | POA: Diagnosis not present

## 2021-07-19 DIAGNOSIS — E663 Overweight: Secondary | ICD-10-CM

## 2021-07-19 DIAGNOSIS — E7800 Pure hypercholesterolemia, unspecified: Secondary | ICD-10-CM

## 2021-07-19 DIAGNOSIS — E1122 Type 2 diabetes mellitus with diabetic chronic kidney disease: Secondary | ICD-10-CM | POA: Diagnosis not present

## 2021-07-19 DIAGNOSIS — N390 Urinary tract infection, site not specified: Secondary | ICD-10-CM

## 2021-07-19 DIAGNOSIS — I700 Atherosclerosis of aorta: Secondary | ICD-10-CM | POA: Diagnosis not present

## 2021-07-19 DIAGNOSIS — Z794 Long term (current) use of insulin: Secondary | ICD-10-CM

## 2021-07-19 DIAGNOSIS — Z0000 Encounter for general adult medical examination without abnormal findings: Secondary | ICD-10-CM | POA: Diagnosis not present

## 2021-07-19 DIAGNOSIS — N3000 Acute cystitis without hematuria: Secondary | ICD-10-CM | POA: Diagnosis not present

## 2021-07-19 DIAGNOSIS — N1832 Chronic kidney disease, stage 3b: Secondary | ICD-10-CM

## 2021-07-19 LAB — POCT URINALYSIS DIPSTICK
Bilirubin, UA: NEGATIVE
Blood, UA: NEGATIVE
Glucose, UA: NEGATIVE
Ketones, UA: NEGATIVE
Nitrite, UA: POSITIVE
Protein, UA: NEGATIVE
Spec Grav, UA: 1.015 (ref 1.010–1.025)
Urobilinogen, UA: 0.2 E.U./dL
pH, UA: 5.5 (ref 5.0–8.0)

## 2021-07-19 LAB — POCT UA - MICROALBUMIN
Albumin/Creatinine Ratio, Urine, POC: <30
Creatinine, POC: 200 mg/dL
Microalbumin Ur, POC: 30 mg/L

## 2021-07-19 NOTE — Progress Notes (Signed)
I,Tianna Badgett,acting as a Education administrator for [name], MD.,have documented all relevant documentation on the behalf of [name], MD,as directed by  [name], MD while in the presence of [name], MD.  This visit occurred during the SARS-CoV-2 public health emergency.  Safety protocols were in place, including screening questions prior to the visit, additional usage of staff PPE, and extensive cleaning of exam room while observing appropriate contact time as indicated for disinfecting solutions.  Subjective:     Patient ID: Michele Wang , female    DOB: 1951/02/06 , 70 y.o.   MRN: [id number]   Chief Complaint  Patient presents with   Annual Exam   Diabetes   Hypertension    HPI  She is here today for a physical examination.  visit. She is followed by GYN for her pelvic examinations.  She has no specific concerns or complaints at this time. She denies headaches, chest pain and shortness of breath.    Diabetes She presents for her follow-up diabetic visit. She has type 2 diabetes mellitus. Her disease course has been stable. There are no hypoglycemic associated symptoms. Pertinent negatives for diabetes include no blurred vision and no chest pain. There are no hypoglycemic complications. Risk factors for coronary artery disease include diabetes mellitus, dyslipidemia, hypertension, post-menopausal and sedentary lifestyle. She is following a generally healthy diet. She participates in exercise intermittently. Her home blood glucose trend is fluctuating minimally. Her breakfast blood glucose is taken between 8-9 am. Her breakfast blood glucose range is generally 110-130 mg/dl. An ACE inhibitor/angiotensin II receptor blocker is being taken.  Hypertension This is a chronic problem. The current episode started more than 1 year ago. The problem has been gradually improving since onset. Associated symptoms include palpitations. Pertinent negatives include no blurred vision, chest  pain or shortness of breath.    Past Medical History:  Diagnosis Date   Breast cancer ([place])    CKD (chronic kidney disease)    Diabetes mellitus without complication ([place])    Type II   Hyperlipidemia    Hypertension    UTI (urinary tract infection)      Family History  Problem Relation Age of Onset   Aneurysm Mother    Healthy Father      Current Outpatient Medications:    atorvastatin (LIPITOR) 40 MG tablet, Take 1 tablet by mouth Monday Wed Friday, Disp: 90 tablet, Rfl: 2   cephALEXin (KEFLEX) 500 MG capsule, Take 1 capsule (500 mg total) by mouth 3 (three) times daily for 10 days., Disp: 21 capsule, Rfl: 0   Continuous Blood Gluc Receiver (FREESTYLE LIBRE 14 DAY READER) DEVI, 1 each by Does not apply route QID., Disp: 2 each, Rfl: 2   Continuous Blood Gluc Sensor (FREESTYLE LIBRE 14 DAY SENSOR) MISC, Use as directed to check blood sugars 4 times per day dx: e11.65, Disp: 2 each, Rfl: 2   insulin aspart (FIASP FLEXTOUCH) 100 UNIT/ML FlexTouch Pen, INJECT INTO SKIN SUBCUTANEOUS BEFORE MEALS PER SLIDING SCALE, Disp: 6 mL, Rfl: 3   insulin detemir (LEVEMIR FLEXTOUCH) 100 UNIT/ML FlexPen, Inject 20 units subcutaneously daily at bedtime, max dose 60 units (Patient taking differently: 22 Units. Inject 20 units subcutaneously daily at bedtime, max dose 60 units), Disp: 15 pen, Rfl: 2   Insulin Pen Needle (PEN NEEDLES 3/16") 31G X 5 MM MISC, Use as directed, once daily., Disp: 90 each, Rfl: 2   nitrofurantoin, macrocrystal-monohydrate, (MACROBID) 100 MG capsule, Take 1 capsule (100 mg  total) by mouth 2 (two) times daily for 5 days., Disp: 10 capsule, Rfl: 0   Olmesartan-amLODIPine-HCTZ 40-5-25 MG TABS, TAKE ONE TABLET BY MOUTH EVERY DAY, Disp: 90 tablet, Rfl: 2   tamoxifen (NOLVADEX) 20 MG tablet, Take 1 tablet (20 mg total) by mouth daily., Disp: 90 tablet, Rfl: 3   Vitamin D, Cholecalciferol, 50 MCG (2000 UT) CAPS, Take 2,000 Units by mouth daily. , Disp: , Rfl:    No Known Allergies     The patient states she uses post menopausal status for birth control. Last LMP was No LMP recorded. Patient is postmenopausal.. Negative for Dysmenorrhea. Negative for: breast discharge, breast lump(s), breast pain and breast self exam. Associated symptoms include abnormal vaginal bleeding. Pertinent negatives include abnormal bleeding (hematology), anxiety, decreased libido, depression, difficulty falling sleep, dyspareunia, history of infertility, nocturia, sexual dysfunction, sleep disturbances, urinary incontinence, urinary urgency, vaginal discharge and vaginal itching. Diet regular.The patient states her exercise level is  intermittent.  . The patient's tobacco use is:  Social History   Tobacco Use  Smoking Status Never  Smokeless Tobacco Never  . She has been exposed to passive smoke. The patient's alcohol use is:  Social History   Substance and Sexual Activity  Alcohol Use No   Review of Systems  Constitutional: Negative.   HENT: Negative.    Eyes: Negative.  Negative for blurred vision.  Respiratory: Negative.  Negative for shortness of breath.   Cardiovascular:  Positive for palpitations. Negative for chest pain.  Gastrointestinal: Negative.   Endocrine: Negative.   Genitourinary: Negative.   Musculoskeletal: Negative.   Skin: Negative.   Allergic/Immunologic: Negative.   Neurological: Negative.   Hematological: Negative.   Psychiatric/Behavioral: Negative.      Today's Vitals   07/19/21 1047  BP: 132/80  Pulse: 76  Temp: 98.2 F (36.8 C)  TempSrc: Oral  Weight: 165 lb (74.8 kg)  Height: 5' 5.2" (1.656 m)   Body mass index is 27.29 kg/m.  Wt Readings from Last 3 Encounters:  07/19/21 165 lb (74.8 kg)  07/13/21 165 lb 3.2 oz (74.9 kg)  05/20/21 166 lb 6.4 oz (75.5 kg)    Objective:  Physical Exam Vitals and nursing note reviewed.  Constitutional:      Appearance: Normal appearance.  HENT:     Head: Normocephalic and atraumatic.     Right Ear:  Tympanic membrane, ear canal and external ear normal.     Left Ear: Tympanic membrane, ear canal and external ear normal.     Nose:     Comments: Masked     Mouth/Throat:     Comments: Masked  Eyes:     Extraocular Movements: Extraocular movements intact.     Conjunctiva/sclera: Conjunctivae normal.     Pupils: Pupils are equal, round, and reactive to light.  Cardiovascular:     Rate and Rhythm: Normal rate and regular rhythm.     Pulses: Normal pulses.          Dorsalis pedis pulses are 2+ on the right side and 2+ on the left side.     Heart sounds: Normal heart sounds.     Comments: Spider veins b/l LE Pulmonary:     Effort: Pulmonary effort is normal.     Breath sounds: Normal breath sounds.  Chest:  Breasts:    Tanner Score is 5.     Right: Normal.     Left: Normal.     Comments: Healed surgical scar left breast Abdominal:  General: Abdomen is flat. Bowel sounds are normal.     Palpations: Abdomen is soft.  Genitourinary:    Comments: deferred Musculoskeletal:        General: Normal range of motion.     Cervical back: Normal range of motion and neck supple.  Feet:     Right foot:     Protective Sensation: 5 sites tested.  5 sites sensed.     Skin integrity: Dry skin present.     Toenail Condition: Right toenails are normal.     Left foot:     Protective Sensation: 5 sites tested.  5 sites sensed.     Skin integrity: Dry skin present.     Toenail Condition: Left toenails are normal.  Skin:    General: Skin is warm and dry.  Neurological:     General: No focal deficit present.     Mental Status: She is alert and oriented to person, place, and time.  Psychiatric:        Mood and Affect: Mood normal.        Behavior: Behavior normal.        Assessment And Plan:     1. Routine general medical examination at a health care facility Comments: A full exam was performed. Importance of monthly self breast exams was discussed with the patient. PATIENT IS ADVISED TO  GET 30-45 MINUTES REGULAR EXERCISE NO LESS THAN FOUR TO FIVE DAYS PER WEEK - BOTH WEIGHTBEARING EXERCISES AND AEROBIC ARE RECOMMENDED.  PATIENT IS ADVISED TO FOLLOW A HEALTHY DIET WITH AT LEAST SIX FRUITS/VEGGIES PER DAY, DECREASE INTAKE OF RED MEAT, AND TO INCREASE FISH INTAKE TO TWO DAYS PER WEEK.  MEATS/FISH SHOULD NOT BE FRIED, BAKED OR BROILED IS PREFERABLE.  IT IS ALSO IMPORTANT TO CUT BACK ON YOUR SUGAR INTAKE. PLEASE AVOID ANYTHING WITH ADDED SUGAR, CORN SYRUP OR OTHER SWEETENERS. IF YOU MUST USE A SWEETENER, YOU CAN TRY STEVIA. IT IS ALSO IMPORTANT TO AVOID ARTIFICIALLY SWEETENERS AND DIET BEVERAGES. LASTLY, I SUGGEST WEARING SPF 50 SUNSCREEN ON EXPOSED PARTS AND ESPECIALLY WHEN IN THE DIRECT SUNLIGHT FOR AN EXTENDED PERIOD OF TIME.  PLEASE AVOID FAST FOOD RESTAURANTS AND INCREASE YOUR WATER INTAKE.   2. Type 2 diabetes mellitus with stage 3b chronic kidney disease, with long-term current use of insulin ([place]) Comments: Diabetic foot exam was performed. I DISCUSSED WITH THE PATIENT AT LENGTH REGARDING THE GOALS OF GLYCEMIC CONTROL AND POSSIBLE LONG-TERM COMPLICATIONS.  I  ALSO STRESSED THE IMPORTANCE OF COMPLIANCE WITH HOME GLUCOSE MONITORING, DIETARY RESTRICTIONS INCLUDING AVOIDANCE OF SUGARY DRINKS/PROCESSED FOODS,  ALONG WITH REGULAR EXERCISE.  I  ALSO STRESSED THE IMPORTANCE OF ANNUAL EYE EXAMS, SELF FOOT CARE AND COMPLIANCE WITH OFFICE VISITS.  - POCT Urinalysis Dipstick (81002) - POCT UA - Microalbumin - EKG 12-Lead - CBC - Hemoglobin A1c - CMP14+EGFR - Lipid panel  3. Parenchymal renal hypertension, stage 1 through stage 4 or unspecified chronic kidney disease Comments: Chronic, controlled. She is encouraged to follow low sodium diet. EKG performed, Atrial rhythm, no new changes. She will f/u in 3-4 months for re-evaluation.  4. Palpitations Comments: Intermittent. I will check electrolytes today. She agrees with Cardiology evaluation.  - TSH - Magnesium  5. Aortic  atherosclerosis (HCC) Comments: Chronic, encouraged to comply with statin therapy and live a heart healthy lifestyle.   6. Acute cystitis without hematuria Comments: U/a pos for  nitrites. I will send off a urine culture since she has recurrent UTIs.  - Urine Culture  7. Overweight (BMI 25.0-29.9) She is advised to aim for at least 150 minutes of exercise per week.   Patient was given opportunity to ask questions. Patient verbalized understanding of the plan and was able to repeat key elements of the plan. All questions were answered to their satisfaction.   I, [name], MD, have reviewed all documentation for this visit. The documentation on 07/25/21 for the exam, diagnosis, procedures, and orders are all accurate and complete.   THE PATIENT IS ENCOURAGED TO PRACTICE SOCIAL DISTANCING DUE TO THE COVID-19 PANDEMIC.

## 2021-07-19 NOTE — Patient Instructions (Signed)
Health Maintenance, Female Adopting a healthy lifestyle and getting preventive care are important in promoting health and wellness. Ask your health care provider about: The right schedule for you to have regular tests and exams. Things you can do on your own to prevent diseases and keep yourself healthy. What should I know about diet, weight, and exercise? Eat a healthy diet  Eat a diet that includes plenty of vegetables, fruits, low-fat dairy products, and lean protein. Do not eat a lot of foods that are high in solid fats, added sugars, or sodium.  Maintain a healthy weight Body mass index (BMI) is used to identify weight problems. It estimates body fat based on height and weight. Your health care provider can help determineyour BMI and help you achieve or maintain a healthy weight. Get regular exercise Get regular exercise. This is one of the most important things you can do for your health. Most adults should: Exercise for at least 150 minutes each week. The exercise should increase your heart rate and make you sweat (moderate-intensity exercise). Do strengthening exercises at least twice a week. This is in addition to the moderate-intensity exercise. Spend less time sitting. Even light physical activity can be beneficial. Watch cholesterol and blood lipids Have your blood tested for lipids and cholesterol at 70 years of age, then havethis test every 5 years. Have your cholesterol levels checked more often if: Your lipid or cholesterol levels are high. You are older than 70 years of age. You are at high risk for heart disease. What should I know about cancer screening? Depending on your health history and family history, you may need to have cancer screening at various ages. This may include screening for: Breast cancer. Cervical cancer. Colorectal cancer. Skin cancer. Lung cancer. What should I know about heart disease, diabetes, and high blood pressure? Blood pressure and heart  disease High blood pressure causes heart disease and increases the risk of stroke. This is more likely to develop in people who have high blood pressure readings, are of African descent, or are overweight. Have your blood pressure checked: Every 3-5 years if you are 18-39 years of age. Every year if you are 40 years old or older. Diabetes Have regular diabetes screenings. This checks your fasting blood sugar level. Have the screening done: Once every three years after age 40 if you are at a normal weight and have a low risk for diabetes. More often and at a younger age if you are overweight or have a high risk for diabetes. What should I know about preventing infection? Hepatitis B If you have a higher risk for hepatitis B, you should be screened for this virus. Talk with your health care provider to find out if you are at risk forhepatitis B infection. Hepatitis C Testing is recommended for: Everyone born from 1945 through 1965. Anyone with known risk factors for hepatitis C. Sexually transmitted infections (STIs) Get screened for STIs, including gonorrhea and chlamydia, if: You are sexually active and are younger than 70 years of age. You are older than 70 years of age and your health care provider tells you that you are at risk for this type of infection. Your sexual activity has changed since you were last screened, and you are at increased risk for chlamydia or gonorrhea. Ask your health care provider if you are at risk. Ask your health care provider about whether you are at high risk for HIV. Your health care provider may recommend a prescription medicine to help   prevent HIV infection. If you choose to take medicine to prevent HIV, you should first get tested for HIV. You should then be tested every 3 months for as long as you are taking the medicine. Pregnancy If you are about to stop having your period (premenopausal) and you may become pregnant, seek counseling before you get  pregnant. Take 400 to 800 micrograms (mcg) of folic acid every day if you become pregnant. Ask for birth control (contraception) if you want to prevent pregnancy. Osteoporosis and menopause Osteoporosis is a disease in which the bones lose minerals and strength with aging. This can result in bone fractures. If you are 65 years old or older, or if you are at risk for osteoporosis and fractures, ask your health care provider if you should: Be screened for bone loss. Take a calcium or vitamin D supplement to lower your risk of fractures. Be given hormone replacement therapy (HRT) to treat symptoms of menopause. Follow these instructions at home: Lifestyle Do not use any products that contain nicotine or tobacco, such as cigarettes, e-cigarettes, and chewing tobacco. If you need help quitting, ask your health care provider. Do not use street drugs. Do not share needles. Ask your health care provider for help if you need support or information about quitting drugs. Alcohol use Do not drink alcohol if: Your health care provider tells you not to drink. You are pregnant, may be pregnant, or are planning to become pregnant. If you drink alcohol: Limit how much you use to 0-1 drink a day. Limit intake if you are breastfeeding. Be aware of how much alcohol is in your drink. In the [place] one drink equals one 12 oz bottle of beer (355 mL), one 5 oz glass of wine (148 mL), or one 1 oz glass of hard liquor (44 mL). General instructions Schedule regular health, dental, and eye exams. Stay current with your vaccines. Tell your health care provider if: You often feel depressed. You have ever been abused or do not feel safe at home. Summary Adopting a healthy lifestyle and getting preventive care are important in promoting health and wellness. Follow your health care provider's instructions about healthy diet, exercising, and getting tested or screened for diseases. Follow your health care provider's  instructions on monitoring your cholesterol and blood pressure. This information is not intended to replace advice given to you by your health care provider. Make sure you discuss any questions you have with your healthcare provider. Document Revised: 11/07/2018 Document Reviewed: 11/07/2018 Elsevier Patient Education  2022 [employer] Inc[employer]  

## 2021-07-20 ENCOUNTER — Other Ambulatory Visit: Payer: Self-pay | Admitting: Internal Medicine

## 2021-07-20 LAB — MAGNESIUM: Magnesium: 2.3 mg/dL (ref 1.6–2.3)

## 2021-07-20 LAB — CMP14+EGFR
ALT: 16 IU/L (ref 0–32)
AST: 19 IU/L (ref 0–40)
Albumin/Globulin Ratio: 1.5 (ref 1.2–2.2)
Albumin: 4.4 g/dL (ref 3.8–4.8)
Alkaline Phosphatase: 62 IU/L (ref 44–121)
BUN/Creatinine Ratio: 21 (ref 12–28)
BUN: 35 mg/dL — ABNORMAL HIGH (ref 8–27)
Bilirubin Total: 0.2 mg/dL (ref 0.0–1.2)
CO2: 27 mmol/L (ref 20–29)
Calcium: 10.3 mg/dL (ref 8.7–10.3)
Chloride: 99 mmol/L (ref 96–106)
Creatinine, Ser: 1.69 mg/dL — ABNORMAL HIGH (ref 0.57–1.00)
Globulin, Total: 2.9 g/dL (ref 1.5–4.5)
Glucose: 143 mg/dL — ABNORMAL HIGH (ref 65–99)
Potassium: 4.6 mmol/L (ref 3.5–5.2)
Sodium: 145 mmol/L — ABNORMAL HIGH (ref 134–144)
Total Protein: 7.3 g/dL (ref 6.0–8.5)
eGFR: 32 mL/min/{1.73_m2} — ABNORMAL LOW (ref >59)

## 2021-07-20 LAB — CBC
Hematocrit: 34.1 % (ref 34.0–46.6)
Hemoglobin: 11.4 g/dL (ref 11.1–15.9)
MCH: 29.5 pg (ref 26.6–33.0)
MCHC: 33.4 g/dL (ref 31.5–35.7)
MCV: 88 fL (ref 79–97)
Platelets: 249 10*3/uL (ref 150–450)
RBC: 3.87 x10E6/uL (ref 3.77–5.28)
RDW: 13.1 % (ref 11.7–15.4)
WBC: 5.4 10*3/uL (ref 3.4–10.8)

## 2021-07-20 LAB — LIPID PANEL
Chol/HDL Ratio: 2.3 ratio (ref 0.0–4.4)
Cholesterol, Total: 145 mg/dL (ref 100–199)
HDL: 62 mg/dL (ref >39)
LDL Chol Calc (NIH): 66 mg/dL (ref 0–99)
Triglycerides: 94 mg/dL (ref 0–149)
VLDL Cholesterol Cal: 17 mg/dL (ref 5–40)

## 2021-07-20 LAB — TSH: TSH: 0.934 u[IU]/mL (ref 0.450–4.500)

## 2021-07-20 LAB — HEMOGLOBIN A1C
Est. average glucose Bld gHb Est-mCnc: 212 mg/dL
Hgb A1c MFr Bld: 9 % — ABNORMAL HIGH (ref 4.8–5.6)

## 2021-07-20 MED ORDER — NITROFURANTOIN MONOHYD MACRO 100 MG PO CAPS: 100.0000 mg | ORAL_CAPSULE | Freq: Two times a day (BID) | ORAL | 0 refills | Status: AC

## 2021-07-22 LAB — URINE CULTURE

## 2021-07-23 ENCOUNTER — Encounter: Payer: Self-pay | Admitting: Internal Medicine

## 2021-07-25 ENCOUNTER — Other Ambulatory Visit: Payer: Self-pay | Admitting: Internal Medicine

## 2021-07-25 DIAGNOSIS — R002 Palpitations: Secondary | ICD-10-CM | POA: Insufficient documentation

## 2021-07-25 DIAGNOSIS — N3000 Acute cystitis without hematuria: Secondary | ICD-10-CM | POA: Insufficient documentation

## 2021-07-25 MED ORDER — CEPHALEXIN 500 MG PO CAPS: 500.0000 mg | ORAL_CAPSULE | Freq: Three times a day (TID) | ORAL | 0 refills | Status: DC

## 2021-07-28 DIAGNOSIS — E1122 Type 2 diabetes mellitus with diabetic chronic kidney disease: Secondary | ICD-10-CM | POA: Diagnosis not present

## 2021-08-04 ENCOUNTER — Ambulatory Visit (INDEPENDENT_AMBULATORY_CARE_PROVIDER_SITE_OTHER): Payer: Medicare PPO

## 2021-08-04 ENCOUNTER — Telehealth: Payer: Medicare PPO

## 2021-08-04 DIAGNOSIS — E782 Mixed hyperlipidemia: Secondary | ICD-10-CM

## 2021-08-04 DIAGNOSIS — E1122 Type 2 diabetes mellitus with diabetic chronic kidney disease: Secondary | ICD-10-CM

## 2021-08-04 DIAGNOSIS — N1832 Chronic kidney disease, stage 3b: Secondary | ICD-10-CM

## 2021-08-04 DIAGNOSIS — Z794 Long term (current) use of insulin: Secondary | ICD-10-CM

## 2021-08-04 DIAGNOSIS — I10 Essential (primary) hypertension: Secondary | ICD-10-CM

## 2021-08-04 NOTE — Chronic Care Management (AMB) (Signed)
Chronic Care Management   CCM RN Visit Note  08/04/2021 Name: Michele Wang MRN: [id number] DOB: 01-Feb-1951  Subjective: Michele Wang is a 70 y.o. year old female who is a primary care patient of [name], MD. The care management team was consulted for assistance with disease management and care coordination needs.    Engaged with patient by telephone for follow up visit in response to provider referral for case management and/or care coordination services.   Consent to Services:  The patient was given information about Chronic Care Management services, agreed to services, and gave verbal consent prior to initiation of services.  Please see initial visit note for detailed documentation.   Patient agreed to services and verbal consent obtained.   Assessment: Review of patient past medical history, allergies, medications, health status, including review of consultants reports, laboratory and other test data, was performed as part of comprehensive evaluation and provision of chronic care management services.   SDOH (Social Determinants of Health) assessments and interventions performed:  Yes, no acute needs   CCM Care Plan  No Known Allergies  Outpatient Encounter Medications as of 08/04/2021  Medication Sig   atorvastatin (LIPITOR) 40 MG tablet Take 1 tablet by mouth Monday Wed Friday   cephALEXin (KEFLEX) 500 MG capsule Take 1 capsule (500 mg total) by mouth 3 (three) times daily for 10 days.   Continuous Blood Gluc Receiver (FREESTYLE LIBRE 14 DAY READER) DEVI 1 each by Does not apply route QID.   Continuous Blood Gluc Sensor (FREESTYLE LIBRE 14 DAY SENSOR) MISC Use as directed to check blood sugars 4 times per day dx: e11.65   insulin aspart (FIASP FLEXTOUCH) 100 UNIT/ML FlexTouch Pen INJECT INTO SKIN SUBCUTANEOUS BEFORE MEALS PER SLIDING SCALE   insulin detemir (LEVEMIR FLEXTOUCH) 100 UNIT/ML FlexPen Inject 20 units subcutaneously daily at bedtime, max dose 60 units (Patient taking  differently: 22 Units. Inject 20 units subcutaneously daily at bedtime, max dose 60 units)   Insulin Pen Needle (PEN NEEDLES 3/16") 31G X 5 MM MISC Use as directed, once daily.   Olmesartan-amLODIPine-HCTZ 40-5-25 MG TABS TAKE ONE TABLET BY MOUTH EVERY DAY   tamoxifen (NOLVADEX) 20 MG tablet Take 1 tablet (20 mg total) by mouth daily.   Vitamin D, Cholecalciferol, 50 MCG (2000 UT) CAPS Take 2,000 Units by mouth daily.    No facility-administered encounter medications on file as of 08/04/2021.    Patient Active Problem List   Diagnosis Date Noted   Acute cystitis without hematuria 07/25/2021   Palpitations 07/25/2021   Posterior vitreous detachment of right eye 02/16/2021   Aortic atherosclerosis ([place]) 11/28/2020   Overweight (BMI 25.0-29.9) 11/28/2020   Moderate nonproliferative diabetic retinopathy of both eyes without macular edema associated with type 2 diabetes mellitus ([place]) 05/19/2020   Vitreomacular adhesion of left eye 05/19/2020   Nuclear sclerotic cataract of both eyes 05/19/2020   Memory deficit 02/24/2020   Stroke (cerebrum) ([place]) 02/24/2020   AKI (acute kidney injury) ([place])    Thrush    Hyperglycemia 01/10/2020   Hypertension 01/10/2020   Acute lower UTI 01/10/2020   Malignant neoplasm of upper-outer quadrant of left breast in female, estrogen receptor positive ([place]) 12/16/2019   Ductal carcinoma in situ (DCIS) of left breast 07/17/2019   Type 2 diabetes mellitus with stage 3b chronic kidney disease, with long-term current use of insulin ([place]) 11/08/2018   Chronic renal disease, stage III ([address]) 11/08/2018   Hypertensive nephropathy 11/08/2018   Pure hypercholesterolemia 11/08/2018   Spider  veins of both lower extremities 08/15/2017    Conditions to be addressed/monitored: HTN, HLD, DM II, CKD   Care Plan : Chronic Kidney (Adult)  Updates made by [name], RN since 08/04/2021 12:00 AM     Problem: Disease Progression   Priority: High     Long-Range Goal:  Disease Progression Prevented or Minimized   Start Date: 03/04/2021  Expected End Date: 03/04/2022  Recent Progress: On track  Priority: High  Note:   Objective:  Lab Results  Component Value Date   HGBA1C 9.0 (H) 07/19/2021   Lab Results  Component Value Date   CREATININE 1.69 (H) 07/19/2021   CREATININE 1.81 (H) 02/17/2021   CREATININE 2.07 (H) 09/08/2020   Lab Results  Component Value Date   EGFR 32 (L) 07/19/2021  Current Barriers:  Ineffective Self Health Maintenance  Clinical Goal(s):  Collaboration with [name], MD regarding development and update of comprehensive plan of care as evidenced by provider attestation and co-signature Inter-disciplinary care team collaboration (see longitudinal plan of care) patient will work with care management team to address care coordination and chronic disease management needs related to Disease Management Educational Needs Care Coordination Medication Management and Education Psychosocial Support   Interventions:  08/04/21 completed successful outbound call to patient  Evaluation of current treatment plan related to CKD Stage IV , self-management and patient's adherence to plan as established by provider. Collaboration with [name], MD regarding development and update of comprehensive plan of care as evidenced by provider attestation       and co-signature Inter-disciplinary care team collaboration (see longitudinal plan of care) Provided education to patient about basic disease process related to chronic kidney disease Review of patient status, including review of consultants reports, relevant laboratory and other test results, and medications completed. Reviewed medications with patient and discussed importance of medication adherence Educated on importance of increasing daily water intake to 64 oz per day unless otherwise directed Reviewed scheduled/upcoming provider appointments including: next PCP follow up appointment  scheduled for 10/07/21 _0 :30 PM  Mailed printed educational material related to Stages of Chronic Kidney diease, Eating Right with Chronic Kidney disease Discussed plans with patient for ongoing care management follow up and provided patient with direct contact information for care management team Self Care Activities:  Continue to adhere to MD recommendations for CKD  Continue to keep all scheduled follow up appointments Take medications as directed  Let your healthcare team know if you are unable to take your medications Call your pharmacy for refills at least 7 days prior to running out of medication Increase your water intake unless otherwise directed Review mailed printed educational materials related to Kidney disease Patient Goals: - to improve and or maintain kidney function  Follow Up Plan: Telephone follow up appointment with care management team member scheduled for: 10/18/21    Care Plan : Diabetes Type 2 (Adult)  Updates made by [name], RN since 08/04/2021 12:00 AM     Problem: Glycemic Management (Diabetes, Type 2)   Priority: High     Long-Range Goal: Glycemic Management Optimized   Start Date: 03/04/2021  Expected End Date: 03/04/2022  Recent Progress: On track  Priority: High  Note:   Objective:  Lab Results  Component Value Date   HGBA1C 9.0 (H) 07/19/2021   Lab Results  Component Value Date   CREATININE 1.69 (H) 07/19/2021   CREATININE 1.81 (H) 02/17/2021   CREATININE 2.07 (H) 09/08/2020   Lab Results  Component Value  Date   EGFR 32 (L) 07/19/2021   Current Barriers:  Knowledge Deficits related to basic Diabetes pathophysiology and self care/management Knowledge Deficits related to medications used for management of diabetes Case Manager Clinical Goal(s):  patient will demonstrate improved adherence to prescribed treatment plan for diabetes self care/management as evidenced by: daily monitoring and recording of CBG  adherence to ADA/ carb  modified diet exercise 5 days/week adherence to prescribed medication regimen contacting provider for new or worsened symptoms or questions Interventions:  08/04/21 completed successful outbound call with patient  Collaboration with [name], MD regarding development and update of comprehensive plan of care as evidenced by provider attestation and co-signature Inter-disciplinary care team collaboration (see longitudinal plan of care) Provided education to patient about basic DM disease process Review of patient status, including review of consultants reports, relevant laboratory and other test results, and medications completed. Reviewed medications with patient and discussed importance of medication adherence Educated patient on dietary and exercise recommendations; Daily glycemic control FBS 80-130, <180 after meals; 15'15' rule  Provided patient with written educational materials related to hypo and hyperglycemia and importance of correct treatment Advised patient, providing education and rationale, to check cbg daily before meals and at bedtime and record, calling PCP and or CCM RN for findings outside established parameters.  Mailed printed educational materials to patient related to Low Carb Smoothies; Mediterranean diet  Discussed plans with patient for ongoing care management follow up and provided patient with direct contact information for care management team Self-Care Activities Self administers oral medications as prescribed Self administers insulin as prescribed Attends all scheduled provider appointments Checks blood sugars as prescribed and utilize hyper and hypoglycemia protocol as needed Adheres to prescribed ADA/carb modified Patient Goals: - check blood sugar at prescribed times - check blood sugar before and after exercise - check blood sugar if I feel it is too high or too low - enter blood sugar readings and medication or insulin into daily log - take the blood  sugar log to all doctor visits - take the blood sugar meter to all doctor visits - keep appointment with eye doctor - schedule appointment with eye doctor - check feet daily for cuts, sores or redness - keep feet up while sitting - trim toenails straight across - wash and dry feet carefully every day - wear comfortable, cotton socks - wear comfortable, well-fitting shoes  Follow Up Plan: Telephone follow up appointment with care management team member scheduled for: 10/18/21    Care Plan : Hypertension (Adult)  Updates made by [name], RN since 08/04/2021 12:00 AM     Problem: Hypertension (Hypertension)   Priority: Medium     Long-Range Goal: Hypertension Monitored   Start Date: 03/04/2021  Expected End Date: 03/04/2022  Recent Progress: On track  Priority: Medium  Note:   Objective:  Last practice recorded BP readings:  BP Readings from Last 3 Encounters:  07/19/21 132/80  07/13/21 136/77  05/20/21 110/76   Most recent eGFR/CrCl:  Lab Results  Component Value Date   EGFR 32 (L) 07/19/2021    No components found for: CRCL Most recent eGFR/CrCl: No results found for: EGFR  No components found for: CRCL Current Barriers:  Knowledge Deficits related to basic understanding of hypertension pathophysiology and self care management Knowledge Deficits related to understanding of medications prescribed for management of hypertension Case Manager Clinical Goal(s):  patient will demonstrate improved adherence to prescribed treatment plan for hypertension as evidenced by taking all medications as  prescribed, monitoring and recording blood pressure as directed, adhering to low sodium/DASH diet Interventions:  08/04/21 successful outbound call completed with patient  Collaboration with [name], MD regarding development and update of comprehensive plan of care as evidenced by provider attestation and co-signature Inter-disciplinary care team collaboration (see longitudinal  plan of care) Evaluation of current treatment plan related to hypertension self management and patient's adherence to plan as established by provider. Provided education to patient re: stroke prevention, s/s of heart attack and stroke, DASH diet, complications of uncontrolled blood pressure Reviewed medications with patient and discussed importance of compliance Advised patient, providing education and rationale, to monitor blood pressure daily and record, calling PCP for findings outside established parameters. Reviewed scheduled/upcoming provider appointments including: new patient appointment with Dr. Skeet Latch scheduled for 10/04/21 _0 :40 am  Mailed printed educational materials to patient related to High Blood Pressure Discussed plans with patient for ongoing care management follow up and provided patient with direct contact information for care management team  Self-Care Activities: Self administers medications as prescribed Attends all scheduled provider appointments Calls provider office for new concerns, questions, or BP outside discussed parameters Checks BP and records as discussed Follows a low sodium diet/DASH diet Patient Goals: - check blood pressure 3 times per week - choose a place to take my blood pressure (home, clinic or office, retail store) - write blood pressure results in a log or diary - learn about high blood pressure  Follow Up Plan: Telephone follow up appointment with care management team member scheduled for: 10/18/21    Plan:Telephone follow up appointment with care management team member scheduled for:  10/18/21  Barb Merino, RN, BSN, CCM Care Management Coordinator [facility]  Direct Phone: [phone]

## 2021-08-04 NOTE — Patient Instructions (Signed)
Goals Addressed      Chronic Kidney Disease - disease progression prevented or minimized   On track    Timeframe:  Long-Range Goal Priority:  High Start Date: 03/04/21                            Expected End Date: 03/04/22  Next Scheduled Follow Up Date: 10/18/21  Self Care Activities:  Continue to adhere to MD recommendations for CKD  Continue to keep all scheduled follow up appointments Take medications as directed  Let your healthcare team know if you are unable to take your medications Call your pharmacy for refills at least 7 days prior to running out of medication Increase your water intake unless otherwise directed Review mailed printed educational materials related to Kidney disease Patient Goals: - to improve and or maintain kidney function                          Monitor and Manage My Blood Sugar-Diabetes Type 2   On track    Timeframe:  Long-Range Goal Priority:  High Start Date: 03/04/21                            Expected End Date: 03/04/22                     Follow Up Date: 10/18/21   - check blood sugar at prescribed times - check blood sugar before and after exercise - check blood sugar if I feel it is too high or too low - enter blood sugar readings and medication or insulin into daily log - take the blood sugar log to all doctor visits - take the blood sugar meter to all doctor visits    Why is this important?   Checking your blood sugar at home helps to keep it from getting very high or very low.  Writing the results in a diary or log helps the doctor know how to care for you.  Your blood sugar log should have the time, date and the results.  Also, write down the amount of insulin or other medicine that you take.  Other information, like what you ate, exercise done and how you were feeling, will also be helpful.     Notes:      Obtain Eye Exam-Diabetes Type 2       Timeframe:  Long-Range Goal Priority:  Medium Start Date: 03/04/21                             Expected End Date: 03/04/22                     Follow Up Date: 10/18/21   - keep appointment with eye doctor - schedule appointment with eye doctor    Why is this important?   Eye check-ups are important when you have diabetes.  Vision loss can be prevented.    Notes:      Perform Foot Care-Diabetes Type 2       Timeframe:  Long-Range Goal Priority:  Medium Start Date: 03/04/21                            Expected End Date: 03/04/22  Follow Up Date: 10/18/21   - check feet daily for cuts, sores or redness - keep feet up while sitting - trim toenails straight across - wash and dry feet carefully every day - wear comfortable, cotton socks - wear comfortable, well-fitting shoes    Why is this important?   Good foot care is very important when you have diabetes.  There are many things you can do to keep your feet healthy and catch a problem early.    Notes:      Track and Manage My Blood Pressure-Hypertension   On track    Timeframe:  Long-Range Goal Priority:  Medium Start Date:  03/04/21                           Expected End Date: 03/04/22                   Follow Up Date: 10/18/21   - check blood pressure 3 times per week - choose a place to take my blood pressure (home, clinic or office, retail store) - write blood pressure results in a log or diary    Why is this important?   You won't feel high blood pressure, but it can still hurt your blood vessels.  High blood pressure can cause heart or kidney problems. It can also cause a stroke.  Making lifestyle changes like losing a Aleena [place]ke[place] weight or eating less salt will help.  Checking your blood pressure at home and at different times of the day can help to control blood pressure.  If the doctor prescribes medicine remember to take it the way the doctor ordered.  Call the office if you cannot afford the medicine or if there are questions about it.     Notes:

## 2021-08-18 ENCOUNTER — Telehealth: Payer: Self-pay

## 2021-08-18 NOTE — Chronic Care Management (AMB) (Signed)
Chronic Care Management Pharmacy Assistant   Name: Michele Wang  MRN: [id number] DOB: 05-15-1951  Reason for Encounter: Disease State/ Diabetes  Recent office visits:  07-19-2021 [name], MD. Referral sent to Cardiology. Glucose= 143, BUN= 35, Creatinine= 1.69, eGFR= 32, Sodium= 145. A1C= 9.0. STOP potassium chloride.  08-04-2021 Little, Claudette Stapler, RN (CCM)  Recent consult visits:  None  Hospital visits:  None in previous 6 months  Medications: Outpatient Encounter Medications as of 08/18/2021  Medication Sig   atorvastatin (LIPITOR) 40 MG tablet Take 1 tablet by mouth Monday Wed Friday   Continuous Blood Gluc Receiver (FREESTYLE LIBRE 14 DAY READER) DEVI 1 each by Does not apply route QID.   Continuous Blood Gluc Sensor (FREESTYLE LIBRE 14 DAY SENSOR) MISC Use as directed to check blood sugars 4 times per day dx: e11.65   insulin aspart (FIASP FLEXTOUCH) 100 UNIT/ML FlexTouch Pen INJECT INTO SKIN SUBCUTANEOUS BEFORE MEALS PER SLIDING SCALE   insulin detemir (LEVEMIR FLEXTOUCH) 100 UNIT/ML FlexPen Inject 20 units subcutaneously daily at bedtime, max dose 60 units (Patient taking differently: 22 Units. Inject 20 units subcutaneously daily at bedtime, max dose 60 units)   Insulin Pen Needle (PEN NEEDLES 3/16") 31G X 5 MM MISC Use as directed, once daily.   Olmesartan-amLODIPine-HCTZ 40-5-25 MG TABS TAKE ONE TABLET BY MOUTH EVERY DAY   tamoxifen (NOLVADEX) 20 MG tablet Take 1 tablet (20 mg total) by mouth daily.   Vitamin D, Cholecalciferol, 50 MCG (2000 UT) CAPS Take 2,000 Units by mouth daily.    No facility-administered encounter medications on file as of 08/18/2021.  Recent Relevant Labs: Lab Results  Component Value Date/Time   HGBA1C 9.0 (H) 07/19/2021 11:43 AM   HGBA1C 10.1 (H) 02/17/2021 10:56 AM   MICROALBUR 30 07/19/2021 04:17 PM   MICROALBUR 30 02/17/2021 02:00 PM    Kidney Function Lab Results  Component Value Date/Time   CREATININE 1.69 (H) 07/19/2021 11:43  AM   CREATININE 1.81 (H) 02/17/2021 10:56 AM   GFRNONAA 24 (L) 09/08/2020 12:13 PM   GFRAA 28 (L) 09/08/2020 12:13 PM    Current antihyperglycemic regimen:  Insulin Determir- inject 22 units daily at bedtime Insulin Aspart- inject as needed for the sliding scale            Only when her BS goes up to 150, she has not had to do this  What recent interventions/DTPs have been made to improve glycemic control:  Educated on Complications of diabetes including kidney damage, retinal damage, and cardiovascular disease Counseled to check feet daily and get yearly eye exams Counseled on diet and exercise extensively  Have there been any recent hospitalizations or ED visits since last visit with CPP? No  Patient denies hypoglycemic symptoms  Patient denies hyperglycemic symptoms  How often are you checking your blood sugar? twice daily  What are your blood sugars ranging?  Fasting: 120, 130 Before meals: None After meals: None Bedtime: None  During the week, how often does your blood glucose drop below 70? Never  Are you checking your feet daily/regularly? Patient states daily  Adherence Review: Is the patient currently on a STATIN medication? Yes Is the patient currently on ACE/ARB medication? Yes Does the patient have >5 day gap between last estimated fill dates? No   Care Gaps: Shingrix overdue Covid booster overdue Yearly foot exam overdue Medicare wellness 02-23-2022 RAF= 1.49%  Star Rating Drugs: Atorvastatin 40 mg- Last [facility]17-[facility]21 [facility] ([facility] is up to date  receiving medications through the [place]) Olmesartan/amlodipine/HCTZ 40-5-25 mg- Last filled 12[facility]22-2020 [facility] (Patient is up to date receiving medications [facility] the [facility])  [place] Pharmacist Assistant [phone]

## 2021-08-23 ENCOUNTER — Other Ambulatory Visit: Payer: Self-pay

## 2021-08-23 MED ORDER — LEVEMIR FLEXTOUCH 100 UNIT/ML ~~LOC~~ SOPN: PEN_INJECTOR | SUBCUTANEOUS | 1 refills | Status: DC

## 2021-08-25 ENCOUNTER — Other Ambulatory Visit: Payer: Self-pay

## 2021-08-25 MED ORDER — LEVEMIR FLEXTOUCH 100 UNIT/ML ~~LOC~~ SOPN: PEN_INJECTOR | SUBCUTANEOUS | 1 refills | Status: DC

## 2021-08-27 DIAGNOSIS — Z794 Long term (current) use of insulin: Secondary | ICD-10-CM

## 2021-08-27 DIAGNOSIS — E1122 Type 2 diabetes mellitus with diabetic chronic kidney disease: Secondary | ICD-10-CM

## 2021-08-27 DIAGNOSIS — E782 Mixed hyperlipidemia: Secondary | ICD-10-CM | POA: Diagnosis not present

## 2021-08-27 DIAGNOSIS — N1832 Chronic kidney disease, stage 3b: Secondary | ICD-10-CM | POA: Diagnosis not present

## 2021-08-27 DIAGNOSIS — I10 Essential (primary) hypertension: Secondary | ICD-10-CM

## 2021-08-28 ENCOUNTER — Other Ambulatory Visit: Payer: Self-pay

## 2021-08-28 ENCOUNTER — Emergency Department (HOSPITAL_COMMUNITY)
Admission: EM | Admit: 2021-08-28 | Discharge: 2021-08-29 | Disposition: A | Discharge status: Home / Self Care | Payer: Medicare PPO | Attending: Emergency Medicine | Admitting: Emergency Medicine

## 2021-08-28 DIAGNOSIS — Z5321 Procedure and treatment not carried out due to patient leaving prior to being seen by health care provider: Secondary | ICD-10-CM | POA: Diagnosis not present

## 2021-08-28 DIAGNOSIS — E1165 Type 2 diabetes mellitus with hyperglycemia: Secondary | ICD-10-CM | POA: Diagnosis not present

## 2021-08-28 DIAGNOSIS — R739 Hyperglycemia, unspecified: Secondary | ICD-10-CM | POA: Insufficient documentation

## 2021-08-28 DIAGNOSIS — R5381 Other malaise: Secondary | ICD-10-CM | POA: Diagnosis not present

## 2021-08-28 DIAGNOSIS — R0981 Nasal congestion: Secondary | ICD-10-CM | POA: Diagnosis not present

## 2021-08-28 LAB — CBG MONITORING, ED: Glucose-Capillary: 310 mg/dL — ABNORMAL HIGH (ref 70–99)

## 2021-08-29 ENCOUNTER — Emergency Department (HOSPITAL_COMMUNITY): Payer: Medicare PPO

## 2021-08-29 ENCOUNTER — Encounter (HOSPITAL_COMMUNITY): Payer: Self-pay | Admitting: *Deleted

## 2021-08-29 ENCOUNTER — Other Ambulatory Visit: Payer: Self-pay

## 2021-08-29 DIAGNOSIS — R739 Hyperglycemia, unspecified: Secondary | ICD-10-CM | POA: Diagnosis not present

## 2021-08-29 DIAGNOSIS — R5381 Other malaise: Secondary | ICD-10-CM | POA: Diagnosis not present

## 2021-08-29 LAB — I-STAT VENOUS BLOOD GAS, ED
Acid-Base Excess: 3 mmol/L — ABNORMAL HIGH (ref 0.0–2.0)
Bicarbonate: 29.8 mmol/L — ABNORMAL HIGH (ref 20.0–28.0)
Calcium, Ion: 1.24 mmol/L (ref 1.15–1.40)
HCT: 38 % (ref 36.0–46.0)
Hemoglobin: 12.9 g/dL (ref 12.0–15.0)
O2 Saturation: 86 %
Potassium: 3.8 mmol/L (ref 3.5–5.1)
Sodium: 136 mmol/L (ref 135–145)
TCO2: 31 mmol/L (ref 22–32)
pCO2, Ven: 54.6 mmHg (ref 44.0–60.0)
pH, Ven: 7.345 (ref 7.250–7.430)
pO2, Ven: 56 mmHg — ABNORMAL HIGH (ref 32.0–45.0)

## 2021-08-29 LAB — URINALYSIS, ROUTINE W REFLEX MICROSCOPIC
Bilirubin Urine: NEGATIVE
Glucose, UA: >=500 mg/dL — AB
Ketones, ur: NEGATIVE mg/dL
Nitrite: POSITIVE — AB
Protein, ur: NEGATIVE mg/dL
Specific Gravity, Urine: 1.014 (ref 1.005–1.030)
pH: 5 (ref 5.0–8.0)

## 2021-08-29 LAB — CBC WITH DIFFERENTIAL/PLATELET
Abs Immature Granulocytes: 0.01 10*3/uL (ref 0.00–0.07)
Basophils Absolute: 0.1 10*3/uL (ref 0.0–0.1)
Basophils Relative: 1 %
Eosinophils Absolute: 0.2 10*3/uL (ref 0.0–0.5)
Eosinophils Relative: 2 %
HCT: 37.8 % (ref 36.0–46.0)
Hemoglobin: 12.4 g/dL (ref 12.0–15.0)
Immature Granulocytes: 0 %
Lymphocytes Relative: 47 %
Lymphs Abs: 3.1 10*3/uL (ref 0.7–4.0)
MCH: 29.5 pg (ref 26.0–34.0)
MCHC: 32.8 g/dL (ref 30.0–36.0)
MCV: 90 fL (ref 80.0–100.0)
Monocytes Absolute: 0.5 10*3/uL (ref 0.1–1.0)
Monocytes Relative: 7 %
Neutro Abs: 2.9 10*3/uL (ref 1.7–7.7)
Neutrophils Relative %: 43 %
Platelets: 252 10*3/uL (ref 150–400)
RBC: 4.2 MIL/uL (ref 3.87–5.11)
RDW: 13.8 % (ref 11.5–15.5)
WBC: 6.7 10*3/uL (ref 4.0–10.5)
nRBC: 0 % (ref 0.0–0.2)

## 2021-08-29 LAB — COMPREHENSIVE METABOLIC PANEL
ALT: 22 U/L (ref 0–44)
AST: 25 U/L (ref 15–41)
Albumin: 3.8 g/dL (ref 3.5–5.0)
Alkaline Phosphatase: 61 U/L (ref 38–126)
Anion gap: 11 (ref 5–15)
BUN: 45 mg/dL — ABNORMAL HIGH (ref 8–23)
CO2: 25 mmol/L (ref 22–32)
Calcium: 9.7 mg/dL (ref 8.9–10.3)
Chloride: 97 mmol/L — ABNORMAL LOW (ref 98–111)
Creatinine, Ser: 2 mg/dL — ABNORMAL HIGH (ref 0.44–1.00)
GFR, Estimated: 26 mL/min — ABNORMAL LOW (ref >60)
Glucose, Bld: 325 mg/dL — ABNORMAL HIGH (ref 70–99)
Potassium: 3.8 mmol/L (ref 3.5–5.1)
Sodium: 133 mmol/L — ABNORMAL LOW (ref 135–145)
Total Bilirubin: 0.6 mg/dL (ref 0.3–1.2)
Total Protein: 7.3 g/dL (ref 6.5–8.1)

## 2021-08-29 MED ORDER — CEPHALEXIN 500 MG PO CAPS: 500.0000 mg | ORAL_CAPSULE | Freq: Two times a day (BID) | ORAL | 0 refills | Status: AC

## 2021-08-29 NOTE — ED Notes (Signed)
PT decided to leave .

## 2021-08-29 NOTE — ED Triage Notes (Signed)
Pt not feeling well since yesterday at present time her scanner result is 308 at present  her sugar was 400  she took 10 uniits regular  insulin with not much difference

## 2021-08-29 NOTE — ED Provider Notes (Cosign Needed)
Emergency Medicine Provider Triage Evaluation Note  Michele Wang , a 70 y.o. female  was evaluated in triage.  Pt complains of general malaise since yesterday. Has noticed her sugars being elevated up to the 400's. This hasn't been corrected with her prescribed sliding scale insulin.  She did take 10 units of insulin prior to arrival with little change in her blood sugar.  Has had some mild congestion without cough, dysuria, fever, abdominal pain.  Denies any recent changes to her insulin regimen.  Review of Systems  Positive: Hyperglycemia, malaise Negative: Fevers, abdominal pain, cough  Physical Exam  BP 119/84 (BP Location: Left Arm)   Pulse 75   Temp 98.6 F (37 C) (Oral)   Resp 18   Ht 5\' 5"  (1.651 m)   Wt 74.8 kg   SpO2 100%   BMI 27.44 kg/m  Gen:   Awake, no distress   Resp:  Normal effort  MSK:   Moves extremities without difficulty  Other:  Heart RRR  Medical Decision Making  Medically screening exam initiated at 12:34 AM.  Appropriate orders placed.  ALIVIANA BURDELL was informed that the remainder of the evaluation will be completed by another provider, this initial triage assessment does not replace that evaluation, and the importance of remaining in the ED until their evaluation is complete.  Hyperglycemia    Antonietta Breach, PA-C 08/29/21 [id number]

## 2021-08-30 ENCOUNTER — Ambulatory Visit: Payer: Self-pay

## 2021-08-30 DIAGNOSIS — I10 Essential (primary) hypertension: Secondary | ICD-10-CM

## 2021-08-30 DIAGNOSIS — N1832 Chronic kidney disease, stage 3b: Secondary | ICD-10-CM

## 2021-08-30 DIAGNOSIS — Z794 Long term (current) use of insulin: Secondary | ICD-10-CM

## 2021-08-30 NOTE — Chronic Care Management (AMB) (Signed)
  Care Management    Consult Note  08/30/2021 Name: Michele Wang MRN: [id number] DOB: 1950-11-29  Care management team received notification of patient's recent emergency department visit related to DM and Hyperglycemia  .Based on review of health record, Michele Wang is currently active in the embedded care coordination program..   Review of patient status, including review of consultants reports, relevant laboratory and other test results, and collaboration with appropriate care team members and the patient's provider was performed as part of comprehensive patient evaluation and provision of chronic care management services.    SW performed chart review to note patient prescribed Cephalexin to treat UTI. Patient left ED AMA. Noted patient is active with both RN Care Manager and PharmD.  Plan:  Collaboration with embedded RN Care Manager and PharmD to advise of patients recent ED visit.  Daneen Schick, BSW, CDP Social Worker, Certified Dementia Practitioner [place] [facility] [place] [facility] [phone]

## 2021-09-01 ENCOUNTER — Other Ambulatory Visit: Payer: Self-pay

## 2021-09-01 ENCOUNTER — Ambulatory Visit (INDEPENDENT_AMBULATORY_CARE_PROVIDER_SITE_OTHER): Payer: Medicare PPO | Admitting: Internal Medicine

## 2021-09-01 ENCOUNTER — Encounter: Payer: Self-pay | Admitting: Internal Medicine

## 2021-09-01 ENCOUNTER — Ambulatory Visit: Payer: Medicare PPO | Admitting: Nurse Practitioner

## 2021-09-01 VITALS — BP 112/60 | Temp 98.0°F | Ht 65.0 in | Wt 160.6 lb

## 2021-09-01 DIAGNOSIS — N3000 Acute cystitis without hematuria: Secondary | ICD-10-CM | POA: Diagnosis not present

## 2021-09-01 DIAGNOSIS — R0981 Nasal congestion: Secondary | ICD-10-CM

## 2021-09-01 DIAGNOSIS — Z23 Encounter for immunization: Secondary | ICD-10-CM | POA: Diagnosis not present

## 2021-09-01 DIAGNOSIS — Z794 Long term (current) use of insulin: Secondary | ICD-10-CM | POA: Diagnosis not present

## 2021-09-01 DIAGNOSIS — N390 Urinary tract infection, site not specified: Secondary | ICD-10-CM | POA: Diagnosis not present

## 2021-09-01 DIAGNOSIS — E1122 Type 2 diabetes mellitus with diabetic chronic kidney disease: Secondary | ICD-10-CM | POA: Diagnosis not present

## 2021-09-01 DIAGNOSIS — N1832 Chronic kidney disease, stage 3b: Secondary | ICD-10-CM | POA: Diagnosis not present

## 2021-09-01 DIAGNOSIS — R739 Hyperglycemia, unspecified: Secondary | ICD-10-CM

## 2021-09-01 LAB — URINE CULTURE: Culture: >=100000 — AB

## 2021-09-01 MED ORDER — CEFTRIAXONE SODIUM 500 MG IJ SOLR
500.0000 mg | Freq: Once | INTRAMUSCULAR | Status: AC
  Administered 2021-09-01: 500 mg via INTRAMUSCULAR

## 2021-09-01 NOTE — Progress Notes (Signed)
Michele Wang,acting as a Education administrator for Michele Greenland, MD.,have documented all relevant documentation on the behalf of Michele Greenland, MD,as directed by  Michele Greenland, MD while in the presence of Michele Greenland, MD.  This visit occurred during the SARS-CoV-2 public health emergency.  Safety protocols were in place, including screening questions prior to the visit, additional usage of staff PPE, and extensive cleaning of exam room while observing appropriate contact time as indicated for disinfecting solutions.  Subjective:     Patient ID: Michele Wang , female    DOB: 02-01-1951 , 70 y.o.   MRN: [id number]   Chief Complaint  Patient presents with   ER F/U    HPI  Patient presents today with her daughter. She stated she is here today for a ER f/u. She presented on 10/1 for further evaluation of hyperglycemia. She admits that she has been more liberal with her eating/drinking. She is back to drinking Pepsi on a regular basis. She states she experienced worsening fatigue a day or two prior to her going to the ER. She noticed her sugars were elevated up to the 400's. This wasn't corrected with her prescribed sliding scale insulin.  She also noted having some mild congestion without cough, dysuria, fever, abdominal pain.  Denies any recent changes to her insulin regimen.    Past Medical History:  Diagnosis Date   Breast cancer ([place])    CKD (chronic kidney disease)    Diabetes mellitus without complication ([place])    Type II   Hyperlipidemia    Hypertension    UTI (urinary tract infection)      Family History  Problem Relation Age of Onset   Aneurysm Mother    Healthy Father      Current Outpatient Medications:    atorvastatin (LIPITOR) 40 MG tablet, Take 1 tablet by mouth Monday Wed Friday, Disp: 90 tablet, Rfl: 2   Continuous Blood Gluc Receiver (FREESTYLE LIBRE 14 DAY READER) DEVI, 1 each by Does not apply route QID., Disp: 2 each, Rfl: 2   Continuous Blood Gluc Sensor  (FREESTYLE LIBRE 14 DAY SENSOR) MISC, Use as directed to check blood sugars 4 times per day dx: e11.65, Disp: 2 each, Rfl: 2   insulin aspart (FIASP FLEXTOUCH) 100 UNIT/ML FlexTouch Pen, INJECT INTO SKIN SUBCUTANEOUS BEFORE MEALS PER SLIDING SCALE, Disp: 6 mL, Rfl: 3   insulin detemir (LEVEMIR FLEXTOUCH) 100 UNIT/ML FlexPen, Inject 22 units subcutaneously daily at bedtime, max dose 60 units, Disp: 15 mL, Rfl: 1   Insulin Pen Needle (PEN NEEDLES 3/16") 31G X 5 MM MISC, Use as directed, once daily., Disp: 90 each, Rfl: 2   Olmesartan-amLODIPine-HCTZ 40-5-25 MG TABS, TAKE ONE TABLET BY MOUTH EVERY DAY, Disp: 90 tablet, Rfl: 2   tamoxifen (NOLVADEX) 20 MG tablet, Take 1 tablet (20 mg total) by mouth daily., Disp: 90 tablet, Rfl: 3   Vitamin D, Cholecalciferol, 50 MCG (2000 UT) CAPS, Take 2,000 Units by mouth daily. , Disp: , Rfl:    Semaglutide,0.25 or 0.5MG /DOS, (OZEMPIC, 0.25 OR 0.5 MG/DOSE,) 2 MG/1.5ML SOPN, Inject 0.5 mg into the skin once a week., Disp: 3 mL, Rfl: 3   No Known Allergies   Review of Systems  Constitutional: Negative.   Eyes: Negative.   Respiratory: Negative.    Cardiovascular: Negative.   Gastrointestinal: Negative.   Endocrine: Negative for polydipsia, polyphagia and polyuria.  Genitourinary:  Positive for frequency.  Musculoskeletal: Negative.   Skin: Negative.   Neurological: Negative.  Psychiatric/Behavioral: Negative.      Today's Vitals   09/01/21 1502  BP: 112/60  Temp: 98 F (36.7 C)  Weight: 160 lb 9.6 oz (72.8 kg)  Height: 5\' 5"  (1.651 m)  PainSc: 0-No pain   Body mass index is 26.73 kg/m.  Wt Readings from Last 3 Encounters:  10/07/21 163 lb 3.2 oz (74 kg)  10/04/21 161 lb 4.8 oz (73.2 kg)  09/01/21 160 lb 9.6 oz (72.8 kg)     Objective:  Physical Exam Vitals and nursing note reviewed.  Constitutional:      Appearance: Normal appearance.  HENT:     Head: Normocephalic and atraumatic.     Nose:     Comments: Masked     Mouth/Throat:      Comments: Masked  Eyes:     Extraocular Movements: Extraocular movements intact.  Cardiovascular:     Rate and Rhythm: Normal rate and regular rhythm.     Heart sounds: Normal heart sounds.  Pulmonary:     Effort: Pulmonary effort is normal.     Breath sounds: Normal breath sounds.  Musculoskeletal:     Cervical back: Normal range of motion.  Skin:    General: Skin is warm.  Neurological:     General: No focal deficit present.     Mental Status: She is alert.  Psychiatric:        Mood and Affect: Mood normal.        Behavior: Behavior normal.      Assessment And Plan:     1. Acute cystitis without hematuria Comments: She was given Rocephin, 500mg  IM  x1.  She is reminded to wipe from front to back when using the bathroom.  - cefTRIAXone (ROCEPHIN) injection 500 mg  2. Type 2 diabetes mellitus with stage 3b chronic kidney disease, with long-term current use of insulin (HCC) Comments: Chronic, hyperglycemia likely exacerbated by UTI. I will increase Levemir to 25 units nightly. Pt advised to stop drinking sodas.   3. Recurrent UTI Comments: She and her daughter agree to Urology referral.  - Ambulatory referral to Urology  4. Sinus congestion Comments: She is advised to avoid dairy and increase her flu id intake. She agrees to COVID PCR testing. - Novel Coronavirus, NAA ([facility])  5. Immunization due Comments: She was given high dose flu vaccine.  - Flu Vaccine QUAD High Dose([facility])   Patient was given opportunity to ask questions. Patient verbalized understanding of the plan and was able to repeat key elements of the plan. All questions were answered to their satisfaction.   I, Michele Greenland, MD, have reviewed all documentation for this visit. The documentation on 10/27/21 for the exam, diagnosis, procedures, and orders are all accurate and complete.   IF YOU HAVE BEEN REFERRED TO A SPECIALIST, IT MAY TAKE 1-2 WEEKS TO SCHEDULE/PROCESS THE REFERRAL. IF YOU HAVE NOT HEARD  FROM US/SPECIALIST IN TWO WEEKS, PLEASE GIVE [place] A CALL AT [phone] X [phone].   THE PATIENT IS ENCOURAGED TO PRACTICE SOCIAL DISTANCING DUE TO THE COVID-19 PANDEMIC.

## 2021-09-01 NOTE — Patient Instructions (Signed)
Diabetes Mellitus and Nutrition, Adult When you have diabetes, or diabetes mellitus, it is very important to have healthy eating habits because your blood sugar (glucose) levels are greatly affected by what you eat and drink. Eating healthy foods in the right amounts, at about the same times every day, can help you:  Control your blood glucose.  Lower your risk of heart disease.  Improve your blood pressure.  Reach or maintain a healthy weight. What can affect my meal plan? Every person with diabetes is different, and each person has different needs for a meal plan. Your health care provider may recommend that you work with a dietitian to make a meal plan that is best for you. Your meal plan may vary depending on factors such as:  The calories you need.  The medicines you take.  Your weight.  Your blood glucose, blood pressure, and cholesterol levels.  Your activity level.  Other health conditions you have, such as heart or kidney disease. How do carbohydrates affect me? Carbohydrates, also called carbs, affect your blood glucose level more than any other type of food. Eating carbs naturally raises the amount of glucose in your blood. Carb counting is a method for keeping track of how many carbs you eat. Counting carbs is important to keep your blood glucose at a healthy level, especially if you use insulin or take certain oral diabetes medicines. It is important to know how many carbs you can safely have in each meal. This is different for every person. Your dietitian can help you calculate how many carbs you should have at each meal and for each snack. How does alcohol affect me? Alcohol can cause a sudden decrease in blood glucose (hypoglycemia), especially if you use insulin or take certain oral diabetes medicines. Hypoglycemia can be a life-threatening condition. Symptoms of hypoglycemia, such as sleepiness, dizziness, and confusion, are similar to symptoms of having too much  alcohol.  Do not drink alcohol if: ? Your health care provider tells you not to drink. ? You are pregnant, may be pregnant, or are planning to become pregnant.  If you drink alcohol: ? Do not drink on an empty stomach. ? Limit how much you use to:  0-1 drink a day for women.  0-2 drinks a day for men. ? Be aware of how much alcohol is in your drink. In the [place] one drink equals one 12 oz bottle of beer (355 mL), one 5 oz glass of wine (148 mL), or one 1 oz glass of hard liquor (44 mL). ? Keep yourself hydrated with water, diet soda, or unsweetened iced tea.  Keep in mind that regular soda, juice, and other mixers may contain a lot of sugar and must be counted as carbs. What are tips for following this plan? Reading food labels  Start by checking the serving size on the "Nutrition Facts" label of packaged foods and drinks. The amount of calories, carbs, fats, and other nutrients listed on the label is based on one serving of the item. Many items contain more than one serving per package.  Check the total grams (g) of carbs in one serving. You can calculate the number of servings of carbs in one serving by dividing the total carbs by 15. For example, if a food has 30 g of total carbs per serving, it would be equal to 2 servings of carbs.  Check the number of grams (g) of saturated fats and trans fats in one serving. Choose foods that have   a low amount or none of these fats.  Check the number of milligrams (mg) of salt (sodium) in one serving. Most people should limit total sodium intake to less than 2,300 mg per day.  Always check the nutrition information of foods labeled as "low-fat" or "nonfat." These foods may be higher in added sugar or refined carbs and should be avoided.  Talk to your dietitian to identify your daily goals for nutrients listed on the label. Shopping  Avoid buying canned, pre-made, or processed foods. These foods tend to be high in fat, sodium, and added  sugar.  Shop around the outside edge of the grocery store. This is where you will most often find fresh fruits and vegetables, bulk grains, fresh meats, and fresh dairy. Cooking  Use low-heat cooking methods, such as baking, instead of high-heat cooking methods like deep frying.  Cook using healthy oils, such as olive, canola, or sunflower oil.  Avoid cooking with butter, cream, or high-fat meats. Meal planning  Eat meals and snacks regularly, preferably at the same times every day. Avoid going long periods of time without eating.  Eat foods that are high in fiber, such as fresh fruits, vegetables, beans, and whole grains. Talk with your dietitian about how many servings of carbs you can eat at each meal.  Eat 4-6 oz (112-168 g) of lean protein each day, such as lean meat, chicken, fish, eggs, or tofu. One ounce (oz) of lean protein is equal to: ? 1 oz (28 g) of meat, chicken, or fish. ? 1 egg. ?  cup (62 g) of tofu.  Eat some foods each day that contain healthy fats, such as avocado, nuts, seeds, and fish.   What foods should I eat? Fruits Berries. Apples. Oranges. Peaches. Apricots. Plums. Grapes. Mango. Papaya. Pomegranate. Kiwi. Cherries. Vegetables Lettuce. Spinach. Leafy greens, including kale, chard, collard greens, and mustard greens. Beets. Cauliflower. Cabbage. Broccoli. Carrots. Green beans. Tomatoes. Peppers. Onions. Cucumbers. Brussels sprouts. Grains Whole grains, such as whole-wheat or whole-grain bread, crackers, tortillas, cereal, and pasta. Unsweetened oatmeal. Quinoa. Brown or wild rice. Meats and other proteins Seafood. Poultry without skin. Lean cuts of poultry and beef. Tofu. Nuts. Seeds. Dairy Low-fat or fat-free dairy products such as milk, yogurt, and cheese. The items listed above may not be a complete list of foods and beverages you can eat. Contact a dietitian for more information. What foods should I avoid? Fruits Fruits canned with  syrup. Vegetables Canned vegetables. Frozen vegetables with butter or cream sauce. Grains Refined white flour and flour products such as bread, pasta, snack foods, and cereals. Avoid all processed foods. Meats and other proteins Fatty cuts of meat. Poultry with skin. Breaded or fried meats. Processed meat. Avoid saturated fats. Dairy Full-fat yogurt, cheese, or milk. Beverages Sweetened drinks, such as soda or iced tea. The items listed above may not be a complete list of foods and beverages you should avoid. Contact a dietitian for more information. Questions to ask a health care provider  Do I need to meet with a diabetes educator?  Do I need to meet with a dietitian?  What number can I call if I have questions?  When are the best times to check my blood glucose? Where to find more information:  American Diabetes Association: diabetes.org  Academy of Nutrition and Dietetics: www.eatright.org  National Institute of Diabetes and Digestive and Kidney Diseases: www.niddk.nih.gov  Association of Diabetes Care and Education Specialists: www.diabeteseducator.org Summary  It is important to have healthy eating   habits because your blood sugar (glucose) levels are greatly affected by what you eat and drink.  A healthy meal plan will help you control your blood glucose and maintain a healthy lifestyle.  Your health care provider may recommend that you work with a dietitian to make a meal plan that is best for you.  Keep in mind that carbohydrates (carbs) and alcohol have immediate effects on your blood glucose levels. It is important to count carbs and to use alcohol carefully. This information is not intended to replace advice given to you by your health care provider. Make sure you discuss any questions you have with your health care provider. Document Revised: 10/22/2019 Document Reviewed: 10/22/2019 Elsevier Patient Education  2021 Elsevier Inc.  

## 2021-09-02 ENCOUNTER — Telehealth: Payer: Self-pay | Admitting: *Deleted

## 2021-09-02 ENCOUNTER — Encounter: Payer: Self-pay | Admitting: Internal Medicine

## 2021-09-02 LAB — SARS-COV-2, NAA 2 DAY TAT

## 2021-09-02 LAB — NOVEL CORONAVIRUS, NAA: SARS-CoV-2, NAA: NOT DETECTED

## 2021-09-02 NOTE — Telephone Encounter (Signed)
Post ED Visit - Positive Culture Follow-up  Culture report reviewed by antimicrobial stewardship pharmacist: [place] Team []  Elenor Quinones, Pharm.D. []  Heide Guile, Pharm.D., BCPS AQ-ID []  Parks Neptune, Pharm.D., BCPS []  Alycia Rossetti, Pharm.D., BCPS []  [place], Pharm.D., BCPS, AAHIVP []  Legrand Como, Pharm.D., BCPS, AAHIVP []  Salome Arnt, PharmD, BCPS []  Johnnette Gourd, PharmD, BCPS []  Hughes Better, PharmD, BCPS []  Leeroy Cha, PharmD []  Laqueta Linden, PharmD, BCPS []  Albertina Parr, PharmD  [place] Team []  Leodis Sias, PharmD []  Lindell Spar, PharmD []  Royetta Asal, PharmD []  Graylin Shiver, Rph []  Rema Fendt) Glennon Mac, PharmD []  Arlyn Dunning, PharmD []  Netta Cedars, PharmD []  Dia Sitter, PharmD []  Leone Haven, PharmD []  Gretta Arab, PharmD []  Theodis Shove, PharmD []  Peggyann Juba, PharmD []  Reuel Boom, PharmD   Positive urine culture Treated with Cephalexin, organism sensitive to the same and no further patient follow-up is required at this time.  Harlon Flor [facility] 09/02/2021, 8:28 AM

## 2021-09-08 ENCOUNTER — Telehealth: Payer: Self-pay

## 2021-09-08 NOTE — Chronic Care Management (AMB) (Signed)
   Called Nicola Police, No answer, left message of appointment on 09-09-2021 at 3:00 via telephone visit with [name], Pharm D. Notified to have all medications, supplements, blood pressure and/or blood sugar logs available during appointment and to return call if need to reschedule.   Care Gaps: Shingrix overdue Covid booster overdue Yearly foot exam overdue Medicare wellness 02-23-2022 RAF= 1.49%  Star Rating Drug: Atorvastatin 40 mg- [facility] ([facility] is up to date receiving medications through the [place]) Olmesartan/amlodipine/HCTZ 40-5-25 mg- Last filled [facility]22-2020 [facility] (Patient is up to date receiving medications through the [place])  Any gaps in medications fill history? No   [place] Pharmacist Assistant [phone]

## 2021-09-09 ENCOUNTER — Ambulatory Visit (INDEPENDENT_AMBULATORY_CARE_PROVIDER_SITE_OTHER): Payer: Medicare PPO

## 2021-09-09 DIAGNOSIS — E1122 Type 2 diabetes mellitus with diabetic chronic kidney disease: Secondary | ICD-10-CM

## 2021-09-09 DIAGNOSIS — I10 Essential (primary) hypertension: Secondary | ICD-10-CM

## 2021-09-09 DIAGNOSIS — N1832 Chronic kidney disease, stage 3b: Secondary | ICD-10-CM

## 2021-09-09 NOTE — Progress Notes (Signed)
Chronic Care Management Pharmacy Note  09/21/2021 Name:  Michele Wang MRN:  [id number] DOB:  [birth year]  Summary: Patient reports that she is doing well but she has been to ER recently.   Recommendations/Changes made from today's visit: Recommend patient continue current medication regimen.  Recommend patient receive COVID -19 vaccine booster    Plan: Patient to receive COVID-19 booster vaccine.    Subjective: Michele Wang is an 70 y.o. year old female who is a primary patient of [name], MD.  The CCM team was consulted for assistance with disease management and care coordination needs.    Engaged with patient by telephone for follow up visit in response to provider referral for pharmacy case management and/or care coordination services.   Consent to Services:  The patient was given information about Chronic Care Management services, agreed to services, and gave verbal consent prior to initiation of services.  Please see initial visit note for detailed documentation.   Patient Care Team: [name], MD as PCP - General (Internal Medicine) Kyung Rudd, MD as Consulting Physician (Radiation Oncology) Jovita Kussmaul, MD as Consulting Physician (General Surgery) Nicholas Lose, MD as Consulting Physician (Hematology and Oncology) Hurman Horn, MD as Consulting Physician (Ophthalmology) [name], RN as [place] Management Mayford Knife, [facility] (Pharmacist)  Recent office visits: 09/01/2021 PCP OV 07/19/2021 PCP OV  Recent consult visits: 07/13/2021 Oncology OV  ED visits: 08/28/2021 ED Visit   Objective:  Lab Results  Component Value Date   CREATININE 2.00 (H) 08/29/2021   BUN 45 (H) 08/29/2021   GFRNONAA 26 (L) 08/29/2021   GFRAA 28 (L) 09/08/2020   NA 136 08/29/2021   K 3.8 08/29/2021   CALCIUM 9.7 08/29/2021   CO2 25 08/29/2021   GLUCOSE 325 (H) 08/29/2021    Lab Results  Component Value Date/Time   HGBA1C 9.0 (H)  07/19/2021 11:43 AM   HGBA1C 10.1 (H) 02/17/2021 10:56 AM   MICROALBUR 30 07/19/2021 04:17 PM   MICROALBUR 30 02/17/2021 02:00 PM    Last diabetic Eye exam:  Lab Results  Component Value Date/Time   HMDIABEYEEXA Retinopathy (A) 02/18/2021 12:00 AM    Last diabetic Foot exam: No results found for: HMDIABFOOTEX   Lab Results  Component Value Date   CHOL 145 07/19/2021   HDL 62 07/19/2021   LDLCALC 66 07/19/2021   TRIG 94 07/19/2021   CHOLHDL 2.3 07/19/2021    Hepatic Function Latest Ref Rng & Units 08/29/2021 07/19/2021 02/17/2021  Total Protein 6.5 - 8.1 g/dL 7.3 7.3 7.8  Albumin 3.5 - 5.0 g/dL 3.8 4.4 4.6  AST 15 - 41 U/L 25 19 19   ALT 0 - 44 U/L 22 16 19   Alk Phosphatase 38 - 126 U/L 61 62 57  Total Bilirubin 0.3 - 1.2 mg/dL 0.6 0.2 0.3  Bilirubin, Direct 0.00 - 0.40 mg/dL - - -    Lab Results  Component Value Date/Time   TSH 0.934 07/19/2021 11:43 AM   TSH 1.150 05/07/2019 10:55 AM    CBC Latest Ref Rng & Units 08/29/2021 08/29/2021 07/19/2021  WBC 4.0 - 10.5 K/uL - 6.7 5.4  Hemoglobin 12.0 - 15.0 g/dL 12.9 12.4 11.4  Hematocrit 36.0 - 46.0 % 38.0 37.8 34.1  Platelets 150 - 400 K/uL - 252 249    Lab Results  Component Value Date/Time   VD25OH 59.2 02/13/2020 01:36 PM    Clinical ASCVD: Yes  The ASCVD Risk score (Arnett DK, et  al., 2019) failed to calculate for the following reasons:   The patient has a prior MI or stroke diagnosis    Depression screen [facility] 2/9 02/17/2021 10/12/2020 02/14/2020  Decreased Interest 0 0 0  Down, Depressed, Hopeless 0 0 0  PHQ - 2 Score 0 0 0  Altered sleeping - - -  Tired, decreased energy - - -  Change in appetite - - -  Feeling bad or failure about yourself  - - -  Trouble concentrating - - -  Moving slowly or fidgety/restless - - -  Suicidal thoughts - - -  PHQ-9 Score - - -  Difficult doing work/chores - - -    Social History   Tobacco Use  Smoking Status Never  Smokeless Tobacco Never   BP Readings from Last 3  Encounters:  09/01/21 112/60  08/28/21 119/84  07/19/21 132/80   Pulse Readings from Last 3 Encounters:  08/28/21 75  07/19/21 76  07/13/21 70   Wt Readings from Last 3 Encounters:  09/01/21 160 lb 9.6 oz (72.8 kg)  08/29/21 164 lb 14.5 oz (74.8 kg)  07/19/21 165 lb (74.8 kg)   BMI Readings from Last 3 Encounters:  09/01/21 26.73 kg/m  08/29/21 27.44 kg/m  07/19/21 27.29 kg/m    Assessment/Interventions: Review of patient past medical history, allergies, medications, health status, including review of consultants reports, laboratory and other test data, was performed as part of comprehensive evaluation and provision of chronic care management services.   SDOH:  (Social Determinants of Health) assessments and interventions performed: No  SDOH Screenings   Alcohol Screen: Not on file  Depression (PHQ2-9): Low Risk    PHQ-2 Score: 0  Financial Resource Strain: Medium Risk   Difficulty of Paying Living Expenses: Somewhat hard  Food Insecurity: No Food Insecurity   Worried About Charity fundraiser in the Last Year: Never true   Ran Out of Food in the Last Year: Never true  Housing: [place] Risk Score: 0  Physical Activity: Inactive   Days of Exercise per Week: 0 days   Minutes of Exercise per Session: 0 min  Social Connections: Not on file  Stress: No Stress Concern Present   Feeling of Stress : Not at all  Tobacco Use: Low Risk    Smoking Tobacco Use: Never   Smokeless Tobacco Use: Never   Passive Exposure: Not on file  Transportation Needs: No Transportation Needs   Lack of Transportation (Medical): No   Lack of Transportation (Non-Medical): No    CCM Care Plan  No Known Allergies  Medications Reviewed Today     Reviewed by Mayford Knife, RPH (Pharmacist) on 09/09/21 at 1506  Med List Status: <None>   Medication Order Taking? Sig Documenting Provider Last Dose Status Informant  atorvastatin (LIPITOR) 40 MG tablet [id number] Yes Take 1  tablet by mouth Monday Wed Friday [name], MD Taking Active   Continuous Blood Gluc Receiver (FREESTYLE LIBRE 14 DAY READER) [place] [id number] Yes 1 each by Does not apply route QID. [name], MD Taking Active   Continuous Blood Gluc Sensor (FREESTYLE LIBRE [place]) [place] [id number] Yes Use as directed to check blood sugars 4 times per day dx: e11.65 [name], MD Taking Active   insulin aspart (FIASP FLEXTOUCH) 100 UNIT/ML FlexTouch Pen [id number] Yes INJECT INTO SKIN SUBCUTANEOUS BEFORE MEALS PER SLIDING SCALE Minette Brine, FNP Taking Active   insulin detemir (LEVEMIR FLEXTOUCH) 100 UNIT/ML FlexPen [id number] Yes  Inject 22 units subcutaneously daily at bedtime, max dose 60 units [name], MD Taking Active   Insulin Pen Needle (PEN NEEDLES 3/16") 31G X 5 MM MISC [id number] Yes Use as directed, once daily. [name], MD Taking Active   Olmesartan-amLODIPine-HCTZ 40-5-25 MG TABS [id number] Yes TAKE ONE TABLET BY MOUTH EVERY DAY [name], MD Taking Active   tamoxifen (NOLVADEX) 20 MG tablet [id number] Yes Take 1 tablet (20 mg total) by mouth daily. Nicholas Lose, MD Taking Active   Vitamin D, Cholecalciferol, 50 MCG (2000 UT) CAPS [id number] Yes Take 2,000 Units by mouth daily.  [provider] Taking Active Self            Patient Active Problem List   Diagnosis Date Noted   Acute cystitis without hematuria 07/25/2021   Palpitations 07/25/2021   Posterior vitreous detachment of right eye 02/16/2021   Aortic atherosclerosis ([place]) 11/28/2020   Overweight (BMI 25.0-29.9) 11/28/2020   Moderate nonproliferative diabetic retinopathy of both eyes without macular edema associated with type 2 diabetes mellitus ([place]) 05/19/2020   Vitreomacular adhesion of left eye 05/19/2020   Nuclear sclerotic cataract of both eyes 05/19/2020   Memory deficit 02/24/2020   Stroke (cerebrum) ([place]) 02/24/2020   AKI (acute kidney injury) ([place])    Thrush    Hyperglycemia  01/10/2020   Hypertension 01/10/2020   Acute lower UTI 01/10/2020   Malignant neoplasm of upper-outer quadrant of left breast in female, estrogen receptor positive ([place]) 12/16/2019   Ductal carcinoma in situ (DCIS) of left breast 07/17/2019   Type 2 diabetes mellitus with stage 3b chronic kidney disease, with long-term current use of insulin ([place]) 11/08/2018   Chronic renal disease, stage III ([place]) 11/08/2018   Hypertensive nephropathy 11/08/2018   Pure hypercholesterolemia 11/08/2018   Spider veins of both lower extremities 08/15/2017    Immunization History  Administered Date(s) Administered   DTaP 08/03/2007, 10/14/2014   Fluad Quad(high Dose 65+) 09/08/2020, 09/01/2021   Influenza, High Dose Seasonal PF 11/06/2019   PFIZER(Purple Top)SARS-COV-2 Vaccination 01/24/2020, 02/19/2020, 11/23/2020   Pneumococcal Conjugate-13 03/03/2014   Pneumococcal Polysaccharide-23 01/30/2016, 02/05/2019    Conditions to be addressed/monitored:  Hypertension and Diabetes  Care Plan : [place]  Updates made by Mayford Knife, [place] since 09/21/2021 12:00 AM     Problem: HTN, DM II   Priority: High  Onset Date: 02/24/2021     Long-Range Goal: Disease Management   Start Date: 02/24/2021  Recent Progress: On track  Priority: High  Note:   Current Barriers:  Unable to independently monitor therapeutic efficacy  Pharmacist Clinical Goal(s):  Patient will achieve adherence to monitoring guidelines and medication adherence to achieve therapeutic efficacy through collaboration with PharmD and provider.   Interventions: 1:1 collaboration with [name], MD regarding development and update of comprehensive plan of care as evidenced by provider attestation and co-signature Inter-disciplinary care team collaboration (see longitudinal plan of care) Comprehensive medication review performed; medication list updated in electronic medical record  Hypertension (BP goal  <130/80) -Controlled -Current treatment: Olmesartan -amlodipine-hctz 40-5-25 mg tablet once per day  -Current home readings: patient does not check her BP at home  -Current dietary habits: she reports eating a low salt diet.  -Current exercise habits: will discuss further with patient at next visit -Denies hypotensive/hypertensive symptoms -Educated on BP goals and benefits of medications for prevention of heart attack, stroke and kidney damage; Daily salt intake goal < 2300 mg; Importance of home blood pressure monitoring; -Counseled to monitor  BP at home at least once per week, document, and provide log at future appointments -Recommended to continue current medication   Diabetes (A1c goal <7%) -Uncontrolled -Current medications: Insulin Aspart 100 unit/ml: inject into skin before meals via sliding scale  Levemir Flextouch 100 unit/ml: Injecting 24 units at bedtime  -Medications previously tried: none noted at this time  -Current home glucose readings fasting glucose: 85,89, 103  post prandial glucose: 119,121 -Denies hypoglycemic/hyperglycemic symptoms -Current meal patterns:  breakfast: [place] sausage, and one organic egg, with a cup of coffee -Califi milk  lunch: [place] on a bed of lettuce   dinner: she reports that dinner is hard because she is eating Salmon and baked chicken, we discussed the 5% discount at [employer] and  drinks: she is drinking more water at least 3 bottles of 16.9 ounces  -Current exercise: she has not be exercising because she has not felt like going.  -Educated on A1c and blood sugar goals; Complications of diabetes including kidney damage, retinal damage, and cardiovascular disease; -Counseled to check feet daily and get yearly eye exams -Recommended to continue current medication  Patient Goals/Self-Care Activities Patient will:  - take medications as prescribed  Follow Up Plan: The patient has been provided with contact information for the care  management team and has been advised to call with any health related questions or concerns.       Medication Assistance: None required.  Patient affirms current coverage meets needs.  Compliance/Adherence/Medication fill history: Care Gaps: Shingrix Vaccine COVID-19 Vaccine Foot Exam  Star-Rating Drugs: Atorvastatin 40 mg tablet Olmesartan-Amlodipine-Hctz 40-5-25 mg  Patient's preferred pharmacy is:  [id number] [place] [address]Phone: [phone]: [phone] [facility] [place]- [address] [place] [address]Phone: [phone]: [phone] [place]Phone: [phone]: [phone] [id number] [place] [address] [facility] [place]one: [phone]: [phone] pill box? Yes Pt endorses 95% compliance  We discussed: Benefits of medication synchronization, packaging and delivery as well as enhanced pharmacist oversight with [employer]. Patient decided to: Continue current medication management strategy  Care Plan and Follow Up Patient Decision:  Patient agrees to Care Plan and Follow-up.  Plan: The patient has been provided with contact information for the care management team and has been advised to call with any health related questions or concerns.   VaOrlando PennerPharmD Clinical Pharmacist [facility] [phone]

## 2021-09-14 DIAGNOSIS — H2513 Age-related nuclear cataract, bilateral: Secondary | ICD-10-CM | POA: Diagnosis not present

## 2021-09-14 DIAGNOSIS — H40033 Anatomical narrow angle, bilateral: Secondary | ICD-10-CM | POA: Diagnosis not present

## 2021-09-21 NOTE — Patient Instructions (Signed)
Visit Information It was great speaking with you today!  Please let me know if you have any questions about our visit.   Goals Addressed             This Visit's Progress    Manage My Medicine       Timeframe:  Long-Range Goal Priority:  High Start Date:                             Expected End Date:                       Follow Up Date 10/13/2021  In Progress:  - keep a list of all the medicines I take; vitamins and herbals too - learn to read medicine labels - use a pillbox to sort medicine - use an alarm clock or phone to remind me to take my medicine    Why is this important?   These steps will help you keep on track with your medicines.           Patient Care Plan: CCM Pharmacy Care Plan     Problem Identified: HTN, DM II   Priority: High  Onset Date: 02/24/2021     Long-Range Goal: Disease Management   Start Date: 02/24/2021  Recent Progress: On track  Priority: High  Note:   Current Barriers:  Unable to independently monitor therapeutic efficacy  Pharmacist Clinical Goal(s):  Patient will achieve adherence to monitoring guidelines and medication adherence to achieve therapeutic efficacy through collaboration with PharmD and provider.   Interventions: 1:1 collaboration with [name], MD regarding development and update of comprehensive plan of care as evidenced by provider attestation and co-signature Inter-disciplinary care team collaboration (see longitudinal plan of care) Comprehensive medication review performed; medication list updated in electronic medical record  Hypertension (BP goal <130/80) -Controlled -Current treatment: Olmesartan -amlodipine-hctz 40-5-25 mg tablet once per day  -Current home readings: patient does not check her BP at home  -Current dietary habits: she reports eating a low salt diet.  -Current exercise habits: will discuss further with patient at next visit -Denies hypotensive/hypertensive symptoms -Educated on BP  goals and benefits of medications for prevention of heart attack, stroke and kidney damage; Daily salt intake goal < 2300 mg; Importance of home blood pressure monitoring; -Counseled to monitor BP at home at least once per week, document, and provide log at future appointments -Recommended to continue current medication   Diabetes (A1c goal <7%) -Uncontrolled -Current medications: Insulin Aspart 100 unit/ml: inject into skin before meals via sliding scale  Levemir Flextouch 100 unit/ml: Injecting 24 units at bedtime  -Medications previously tried: none noted at this time  -Current home glucose readings fasting glucose: 85,89, 103  post prandial glucose: 119,121 -Denies hypoglycemic/hyperglycemic symptoms -Current meal patterns:  breakfast: [place] sausage, and one organic egg, with a cup of coffee -[employer]ifi milk  lunch: [place] on a bed of lettuce   dinner: she reports that dinner is hard because she is eating [place] and baked chicken, we discussed the 5% discount at [employer] and  drinks: she is drinking more water at least 3 bottles of 16.9 ounces  -Current exercise: she has not be exercising because she has not felt like going.  -Educated on A1c and blood sugar goals; Complications of diabetes including kidney damage, retinal damage, and cardiovascular disease; -Counseled to check feet daily and get yearly eye exams -Recommended to continue  current medication  Patient Goals/Self-Care Activities Patient will:  - take medications as prescribed  Follow Up Plan: The patient has been provided with contact information for the care management team and has been advised to call with any health related questions or concerns.        Patient agreed to services and verbal consent obtained.   The patient verbalized understanding of instructions, educational materials, and care plan provided today and agreed to receive a mailed copy of patient instructions, educational materials, and care  plan.   [name], PharmD Clinical Pharmacist [facility] [phone]

## 2021-09-27 DIAGNOSIS — E1122 Type 2 diabetes mellitus with diabetic chronic kidney disease: Secondary | ICD-10-CM

## 2021-09-27 DIAGNOSIS — Z794 Long term (current) use of insulin: Secondary | ICD-10-CM | POA: Diagnosis not present

## 2021-09-27 DIAGNOSIS — N1832 Chronic kidney disease, stage 3b: Secondary | ICD-10-CM | POA: Diagnosis not present

## 2021-09-27 DIAGNOSIS — I10 Essential (primary) hypertension: Secondary | ICD-10-CM

## 2021-10-04 ENCOUNTER — Encounter (HOSPITAL_BASED_OUTPATIENT_CLINIC_OR_DEPARTMENT_OTHER): Payer: Self-pay | Admitting: Cardiovascular Disease

## 2021-10-04 ENCOUNTER — Other Ambulatory Visit: Payer: Self-pay

## 2021-10-04 ENCOUNTER — Ambulatory Visit (INDEPENDENT_AMBULATORY_CARE_PROVIDER_SITE_OTHER): Payer: Medicare PPO | Admitting: Cardiovascular Disease

## 2021-10-04 VITALS — BP 134/90 | HR 63 | Ht 65.0 in | Wt 161.3 lb

## 2021-10-04 DIAGNOSIS — R002 Palpitations: Secondary | ICD-10-CM | POA: Diagnosis not present

## 2021-10-04 NOTE — Patient Instructions (Addendum)
Medication Instructions:  Your physician recommends that you continue on your current medications as directed. Please refer to the Current Medication list given to you today.   *If you need a refill on your cardiac medications before your next appointment, please call your pharmacy*  Lab Work: NONE  Testing/Procedures: NONE  Follow-Up: At [facility], you and your health needs are our priority.  As part of our continuing mission to provide you with exceptional heart care, we have created designated Provider Care Teams.  These Care Teams include your primary Cardiologist (physician) and Advanced Practice Providers (APPs -  Physician Assistants and Nurse Practitioners) who all work together to provide you with the care you need, when you need it.  We recommend signing up for the patient portal called "MyChart".  Sign up information is provided on this After Visit Summary.  MyChart is used to connect with patients for Virtual Visits (Telemedicine).  Patients are able to view lab/test results, encounter notes, upcoming appointments, etc.  Non-urgent messages can be sent to your provider as well.   To learn more about what you can do with MyChart, go to [email or web address].    Your next appointment:   6 month(s)  The format for your next appointment:   In Person  Provider:   Skeet Latch, MD   Other Instructions  INCREASE YOUR EXERCISE   Consider getting a Kardia mobile device to catch the abnormal heart rhythms

## 2021-10-04 NOTE — Progress Notes (Signed)
Cardiology Office Note   Date:  10/04/2021   ID:  Michele Wang [birth year], MRN [id number]  PCP:  Michele Chard, MD  Cardiologist:   Michele Latch, MD   No chief complaint on file.     History of Present Illness: Michele Wang is a 71 y.o. female with atherosclerosis of the aorta, hypertension, stroke, diabetes, CKD 3, hyperlipidemia, and breast cancerwho is being seen today for the evaluation of palpitations at the request of Michele Chard, MD. she saw Dr. Baird Wang on 06/2021 and reported intermittent palpitations.  TSH and electrolytes were normal.  She noticed palpitations several months ago.  At the time her blood sugars were out of control and were running very high.  The episodes lasted for about 30 minutes and she noted intermittent palpitations during that time.  It mostly occurred when she was laying in bed at night.  Rarely she had episodes during the day.  They were not associated with lightheadedness, dizziness, shortness of breath, or chest pain.  They were not exertional.  She does not recall feeling particularly stressed at the time.  She notes that she was drinking more caffeine and has since cut back on her sodas.  She has not had any issues with GERD.  She notes that she does not exercise regularly.  When she does she has no exertional symptoms.  She has no lower extremity edema, orthopnea, or PND.  Lately her blood pressure at home has been in the 120s over 70s to 80s.   Past Medical History:  Diagnosis Date   Breast Wang ([place])    CKD (chronic kidney disease)    Diabetes mellitus without complication ([place])    Type II   Hyperlipidemia    Hypertension    UTI (urinary tract infection)     Past Surgical History:  Procedure Laterality Date   BREAST LUMPECTOMY WITH RADIOACTIVE SEED LOCALIZATION Left 08/21/2019   Procedure: LEFT BREAST LUMPECTOMY WITH RADIOACTIVE SEED LOCALIZATION;  Surgeon: Michele Kussmaul, MD;  Location: [place];  Service:  General;  Laterality: Left;   CESAREAN SECTION     x2     Current Outpatient Medications  Medication Sig Dispense Refill   atorvastatin (LIPITOR) 40 MG tablet Take 1 tablet by mouth Monday Wed Friday 90 tablet 2   Continuous Blood Gluc Receiver (FREESTYLE LIBRE 14 DAY READER) DEVI 1 each by Does not apply route QID. 2 each 2   Continuous Blood Gluc Sensor (FREESTYLE LIBRE 14 DAY SENSOR) MISC Use as directed to check blood sugars 4 times per day dx: e11.65 2 each 2   insulin aspart (FIASP FLEXTOUCH) 100 UNIT/ML FlexTouch Pen INJECT INTO SKIN SUBCUTANEOUS BEFORE MEALS PER SLIDING SCALE 6 mL 3   insulin detemir (LEVEMIR FLEXTOUCH) 100 UNIT/ML FlexPen Inject 22 units subcutaneously daily at bedtime, max dose 60 units 15 mL 1   Insulin Pen Needle (PEN NEEDLES 3/16") 31G X 5 MM MISC Use as directed, once daily. 90 each 2   Olmesartan-amLODIPine-HCTZ 40-5-25 MG TABS TAKE ONE TABLET BY MOUTH EVERY DAY 90 tablet 2   tamoxifen (NOLVADEX) 20 MG tablet Take 1 tablet (20 mg total) by mouth daily. 90 tablet 3   Vitamin D, Cholecalciferol, 50 MCG (2000 UT) CAPS Take 2,000 Units by mouth daily.      No current facility-administered medications for this visit.    Allergies:   Patient has no known allergies.    Social History:  The patient  reports that  she has never smoked. She has never used smokeless tobacco. She reports that she does not drink alcohol and does not use drugs.   Family History:  The patient's family history includes Aneurysm in her mother; Healthy in her father.    ROS:  Please see the history of present illness.   Otherwise, review of systems are positive for none.   All other systems are reviewed and negative.    PHYSICAL EXAM: VS:  BP 134/90 (BP Location: Left Arm, Patient Position: Sitting, Cuff Size: Normal)   Pulse 63   Ht 5\' 5"  (1.651 m)   Wt 161 lb 4.8 oz (73.2 kg)   BMI 26.84 kg/m  , BMI Body mass index is 26.84 kg/m. GENERAL:  Well appearing HEENT:  Pupils equal  round and reactive, fundi not visualized, oral mucosa unremarkable NECK:  No jugular venous distention, waveform within normal limits, carotid upstroke brisk and symmetric, no bruits, no thyromegaly LYMPHATICS:  No cervical adenopathy LUNGS:  Clear to auscultation bilaterally HEART:  RRR.  PMI not displaced or sustained,S1 and S2 within normal limits, no S3, no S4, no clicks, no rubs, no murmurs ABD:  Flat, positive bowel sounds normal in frequency in pitch, no bruits, no rebound, no guarding, no midline pulsatile mass, no hepatomegaly, no splenomegaly EXT:  2 plus pulses throughout, no edema, no cyanosis no clubbing SKIN:  No rashes no nodules NEURO:  Cranial nerves II through XII grossly intact, motor grossly intact throughout PSYCH:  Cognitively intact, oriented to person place and time    EKG:  EKG is ordered today. The ekg ordered today demonstrates sinus rhythm.  Rate 63 bpm.  LVH.   Recent Labs: 07/19/2021: Magnesium 2.3; TSH 0.934 08/29/2021: ALT 22; BUN 45; Creatinine, Ser 2.00; Hemoglobin 12.9; Platelets 252; Potassium 3.8; Sodium 136    Lipid Panel    Component Value Date/Time   CHOL 145 07/19/2021 1143   TRIG 94 07/19/2021 1143   HDL 62 07/19/2021 1143   CHOLHDL 2.3 07/19/2021 1143   CHOLHDL 5.5 09/17/2009 0200   VLDL 31 09/17/2009 0200   LDLCALC 66 07/19/2021 1143      Wt Readings from Last 3 Encounters:  10/04/21 161 lb 4.8 oz (73.2 kg)  09/01/21 160 lb 9.6 oz (72.8 kg)  08/29/21 164 lb 14.5 oz (74.8 kg)      ASSESSMENT AND PLAN:  #Palpitations: Symptoms have resolved.  At the time her blood sugars were out of control and her caffeine intake was higher.  I suspect this was contributing.  Labs were unremarkable.  Given that she is not having the symptoms anymore, we will not get an ambulatory monitor.  We did discuss her getting a Kardia mobile device.  That way she can capture if it recurs.  #Atherosclerosis of the aorta: She had a chest CT 06/2020 that  revealed atherosclerosis of the aorta.  Lipids are at goal.  LDL was 66.  Continue atorvastatin.  #Hypertension: BP slightly above goal of <130/80.  She is going to work on increasing her exercise rather than changing medicines at this time.  Continue olmesartan, amlodipine, and HCTZ.  Current medicines are reviewed at length with the patient today.  The patient does not have concerns regarding medicines.  The following changes have been made:  no change  Labs/ tests ordered today include:   Orders Placed This Encounter  Procedures   EKG 12-Lead     Disposition:   FU with Yariah Selvey C. Oval Linsey, MD, [facility] in 6 months.  Signed, Laree Garron C. Oval Linsey, MD, [facility]  10/04/2021 5:58 PM    [facility]

## 2021-10-07 ENCOUNTER — Other Ambulatory Visit: Payer: Self-pay

## 2021-10-07 ENCOUNTER — Ambulatory Visit (INDEPENDENT_AMBULATORY_CARE_PROVIDER_SITE_OTHER): Payer: Medicare PPO | Admitting: Internal Medicine

## 2021-10-07 ENCOUNTER — Encounter: Payer: Self-pay | Admitting: Internal Medicine

## 2021-10-07 VITALS — BP 102/84 | HR 79 | Temp 98.1°F | Ht 65.0 in | Wt 163.2 lb

## 2021-10-07 DIAGNOSIS — E1122 Type 2 diabetes mellitus with diabetic chronic kidney disease: Secondary | ICD-10-CM

## 2021-10-07 DIAGNOSIS — Z794 Long term (current) use of insulin: Secondary | ICD-10-CM | POA: Diagnosis not present

## 2021-10-07 DIAGNOSIS — I129 Hypertensive chronic kidney disease with stage 1 through stage 4 chronic kidney disease, or unspecified chronic kidney disease: Secondary | ICD-10-CM

## 2021-10-07 DIAGNOSIS — E782 Mixed hyperlipidemia: Secondary | ICD-10-CM

## 2021-10-07 DIAGNOSIS — R002 Palpitations: Secondary | ICD-10-CM | POA: Diagnosis not present

## 2021-10-07 DIAGNOSIS — N390 Urinary tract infection, site not specified: Secondary | ICD-10-CM

## 2021-10-07 DIAGNOSIS — N1832 Chronic kidney disease, stage 3b: Secondary | ICD-10-CM | POA: Diagnosis not present

## 2021-10-07 NOTE — Progress Notes (Signed)
I,Victoria T Hamilton,acting as a scribe for [name], MD.,have documented all relevant documentation on the behalf of [name], MD,as directed by  [name], MD while in the presence of [name], MD.  This visit occurred during the SARS-CoV-2 public health emergency.  Safety protocols were in place, including screening questions prior to the visit, additional usage of staff PPE, and extensive cleaning of exam room while observing appropriate contact time as indicated for disinfecting solutions.  Subjective:     Patient ID: Michele Wang , female    DOB: 05-Aug-1951 , 70 y.o.   MRN: [id number]   Chief Complaint  Patient presents with   Hypertension   Diabetes    HPI  Pt presents today for BP/Diabetes check. She reports compliance with meds. States she has stopped drinking sodas.     Hypertension This is a chronic problem. The current episode started more than 1 year ago. The problem has been gradually improving since onset. The problem is controlled. Pertinent negatives include no blurred vision, chest pain, orthopnea, palpitations or shortness of breath. Risk factors for coronary artery disease include diabetes mellitus, dyslipidemia, post-menopausal state and sedentary lifestyle. The current treatment provides moderate improvement. Compliance problems include exercise.  Hypertensive end-organ damage includes kidney disease.  Diabetes She presents for her follow-up diabetic visit. She has type 2 diabetes mellitus. There are no hypoglycemic associated symptoms. There are no diabetic associated symptoms. Pertinent negatives for diabetes include no blurred vision and no chest pain. There are no hypoglycemic complications. Diabetic complications include nephropathy. She is following a diabetic diet. She participates in exercise intermittently.    Past Medical History:  Diagnosis Date   Breast cancer ([place])    CKD (chronic kidney disease)    Diabetes mellitus without  complication ([place])    Type II   Hyperlipidemia    Hypertension    UTI (urinary tract infection)      Family History  Problem Relation Age of Onset   Aneurysm Mother    Healthy Father      Current Outpatient Medications:    atorvastatin (LIPITOR) 40 MG tablet, Take 1 tablet by mouth Monday Wed Friday, Disp: 90 tablet, Rfl: 2   Continuous Blood Gluc Receiver (FREESTYLE LIBRE 14 DAY READER) DEVI, 1 each by Does not apply route QID., Disp: 2 each, Rfl: 2   Continuous Blood Gluc Sensor (FREESTYLE LIBRE 14 DAY SENSOR) MISC, Use as directed to check blood sugars 4 times per day dx: e11.65, Disp: 2 each, Rfl: 2   insulin aspart (FIASP FLEXTOUCH) 100 UNIT/ML FlexTouch Pen, INJECT INTO SKIN SUBCUTANEOUS BEFORE MEALS PER SLIDING SCALE, Disp: 6 mL, Rfl: 3   insulin detemir (LEVEMIR FLEXTOUCH) 100 UNIT/ML FlexPen, Inject 22 units subcutaneously daily at bedtime, max dose 60 units, Disp: 15 mL, Rfl: 1   Insulin Pen Needle (PEN NEEDLES 3/16") 31G X 5 MM MISC, Use as directed, once daily., Disp: 90 each, Rfl: 2   Olmesartan-amLODIPine-HCTZ 40-5-25 MG TABS, TAKE ONE TABLET BY MOUTH EVERY DAY, Disp: 90 tablet, Rfl: 2   tamoxifen (NOLVADEX) 20 MG tablet, Take 1 tablet (20 mg total) by mouth daily., Disp: 90 tablet, Rfl: 3   Vitamin D, Cholecalciferol, 50 MCG (2000 UT) CAPS, Take 2,000 Units by mouth daily. , Disp: , Rfl:    No Known Allergies   Review of Systems  Constitutional: Negative.   Eyes:  Negative for blurred vision.  Respiratory: Negative.  Negative for shortness of breath.   Cardiovascular:  Negative.  Negative for chest pain, palpitations and orthopnea.  Neurological: Negative.   Psychiatric/Behavioral: Negative.      Today's Vitals   10/07/21 1535  BP: 102/84  Pulse: 79  Temp: 98.1 F (36.7 C)  SpO2: 96%  Weight: 163 lb 3.2 oz (74 kg)  Height: 5' 5"  (1.651 m)  PainSc: 0-No pain  x Body mass index is 27.16 kg/m.  Wt Readings from Last 3 Encounters:  10/07/21 163 lb 3.2 oz (74  kg)  10/04/21 161 lb 4.8 oz (73.2 kg)  09/01/21 160 lb 9.6 oz (72.8 kg)    Objective:  Physical Exam Vitals and nursing note reviewed.  Constitutional:      Appearance: Normal appearance.  HENT:     Head: Normocephalic and atraumatic.     Nose:     Comments: Masked     Mouth/Throat:     Comments: Masked  Eyes:     Extraocular Movements: Extraocular movements intact.  Cardiovascular:     Rate and Rhythm: Normal rate and regular rhythm.     Heart sounds: Normal heart sounds.  Pulmonary:     Effort: Pulmonary effort is normal.     Breath sounds: Normal breath sounds.  Musculoskeletal:     Cervical back: Normal range of motion.  Skin:    General: Skin is warm.  Neurological:     General: No focal deficit present.     Mental Status: She is alert.  Psychiatric:        Mood and Affect: Mood normal.        Behavior: Behavior normal.        Assessment And Plan:     1. Parenchymal renal hypertension, stage 1 through stage 4 or unspecified chronic kidney disease Comments: Chronic, well controlled. NO med changes today. Encouraged to follow low sodium diet.   2. Type 2 diabetes mellitus with stage 3b chronic kidney disease, with long-term current use of insulin ([place]) Comments: Libre reviewed, 85% time in target. Tends to have higher BS in late afternoon. I will check labs as listed below. She will f/u in 3 months.  - BMP8+eGFR - Hemoglobin A1c  3. Palpitations Comments: Improved since cutting back on caffeine. I appreciated Cardiology input. Will consider monitor if her sx recur.   4. Recurrent UTI Comments: She is encouraged to keep tomorrow's appt with Urology. Encouraged to stay well hydrated.    Patient was given opportunity to ask questions. Patient verbalized understanding of the plan and was able to repeat key elements of the plan. All questions were answered to their satisfaction.   I, [name], MD, have reviewed all documentation for this visit. The  documentation on 10/11/21 for the exam, diagnosis, procedures, and orders are all accurate and complete.   IF YOU HAVE BEEN REFERRED TO A SPECIALIST, IT MAY TAKE 1-2 WEEKS TO SCHEDULE/PROCESS THE REFERRAL. IF YOU HAVE NOT HEARD FROM US/SPECIALIST IN TWO WEEKS, PLEASE GIVE [place] A CALL AT [phone] X [phone].   THE PATIENT IS ENCOURAGED TO PRACTICE SOCIAL DISTANCING DUE TO THE COVID-19 PANDEMIC.

## 2021-10-07 NOTE — Patient Instructions (Signed)

## 2021-10-08 LAB — BMP8+EGFR
BUN/Creatinine Ratio: 17 (ref 12–28)
BUN: 29 mg/dL — ABNORMAL HIGH (ref 8–27)
CO2: 27 mmol/L (ref 20–29)
Calcium: 9.6 mg/dL (ref 8.7–10.3)
Chloride: 103 mmol/L (ref 96–106)
Creatinine, Ser: 1.75 mg/dL — ABNORMAL HIGH (ref 0.57–1.00)
Glucose: 181 mg/dL — ABNORMAL HIGH (ref 70–99)
Potassium: 4.7 mmol/L (ref 3.5–5.2)
Sodium: 141 mmol/L (ref 134–144)
eGFR: 31 mL/min/{1.73_m2} — ABNORMAL LOW (ref >59)

## 2021-10-08 LAB — HEMOGLOBIN A1C
Est. average glucose Bld gHb Est-mCnc: 235 mg/dL
Hgb A1c MFr Bld: 9.8 % — ABNORMAL HIGH (ref 4.8–5.6)

## 2021-10-11 DIAGNOSIS — N390 Urinary tract infection, site not specified: Secondary | ICD-10-CM | POA: Insufficient documentation

## 2021-10-12 ENCOUNTER — Telehealth: Payer: Self-pay

## 2021-10-12 ENCOUNTER — Telehealth: Payer: Self-pay | Admitting: *Deleted

## 2021-10-12 NOTE — Chronic Care Management (AMB) (Signed)
  Care Management   Note  10/12/2021 Name: Michele Wang MRN: [id number] DOB: 09-Dec-1950  Michele Wang is a 70 y.o. year old female who is a primary care patient of [name], MD and is actively engaged with the care management team. I reached out to Nicola Police by phone today to assist with re-scheduling a follow up visit with the RN Case Manager  Follow up plan: Unsuccessful telephone outreach attempt made. A HIPAA compliant phone message was left for the patient providing contact information and requesting a return call.  The care management team will reach out to the patient again over the next 7 days.  If patient returns call to provider office, please advise to call [place] at [phone].  [facility] Management  Direct Dial: [phone]

## 2021-10-12 NOTE — Chronic Care Management (AMB) (Signed)
  Nicola Police was reminded to have all medications, supplements and any blood glucose and blood pressure readings available for review with [name], Pharm. D, at her telephone visit on 10-13-2021 at 9:00.   Questions: Have you had any recent office visit or specialist visit outside of [place]? Patient stated no  Are there any concerns you would like to discuss during your office visit? Patient stated she wanted to discuss about the price increase of libre. Patient was working and couldn't talk on the phone long to explain.  Are you having any problems obtaining your medications? (Whether it pharmacy issues or cost) Patient stated the price increase of libre.  If patient has any PAP medications ask if they are having any problems getting their PAP medication or refill? No PAP medications.  Care Gaps: Shingrix overdue Covid booster overdue last completed 11-23-2020 Yearly foot exam overdue last completed 07-13-2020  Star Rating Drug: Atorvastatin 40 mg- Last filled [facility]-17-2021 [facility] (Patient is up to date receiving medications through the [place]) Olmesartan/amlodipine/HCTZ 40-5-25 mg- Last filled 12-22-2020 [facility] (Patient is up to date receiving medications through the [place])  Any gaps in medications fill history? No  [place] Pharmacist Assistant [phone]

## 2021-10-13 ENCOUNTER — Telehealth: Payer: Medicare PPO

## 2021-10-13 ENCOUNTER — Ambulatory Visit (INDEPENDENT_AMBULATORY_CARE_PROVIDER_SITE_OTHER): Payer: Medicare PPO

## 2021-10-13 DIAGNOSIS — E782 Mixed hyperlipidemia: Secondary | ICD-10-CM

## 2021-10-13 DIAGNOSIS — N1832 Chronic kidney disease, stage 3b: Secondary | ICD-10-CM

## 2021-10-13 DIAGNOSIS — E1122 Type 2 diabetes mellitus with diabetic chronic kidney disease: Secondary | ICD-10-CM

## 2021-10-13 MED ORDER — OZEMPIC (0.25 OR 0.5 MG/DOSE) 2 MG/1.5ML ~~LOC~~ SOPN: 0.2500 mg | PEN_INJECTOR | SUBCUTANEOUS | 0 refills | Status: DC

## 2021-10-13 NOTE — Progress Notes (Signed)
Chronic Care Management Pharmacy Note  10/13/2021 Name:  Michele Wang MRN:  [id number] DOB:  June 19, 1951  Summary: Patient reports that she is doing well, but she had issues with her libre sensor.   Recommendations/Changes made from today's visit: Recommend patient receive COVID-19 vaccine booster. Recommend patient be started on Ozempic 0.25 mg once per week.   Plan: Patient to start Ozempic 0.25 mg once per week on Wednesdays. Patient to have COVID-19 vaccine booster [facility] [address].     Subjective: Michele Wang is an 70 y.o. year old female who is a primary patient of [name], MD.  The CCM team was consulted for assistance with disease management and care coordination needs.    Engaged with patient by telephone for follow up visit in response to provider referral for pharmacy case management and/or care coordination services.   Consent to Services:  The patient was given information about Chronic Care Management services, agreed to services, and gave verbal consent prior to initiation of services.  Please see initial visit note for detailed documentation.   Patient Care Team: [name], MD as PCP - General (Internal Medicine) Kyung Rudd, MD as Consulting Physician (Radiation Oncology) Jovita Kussmaul, MD as Consulting Physician (General Surgery) Nicholas Lose, MD as Consulting Physician (Hematology and Oncology) Hurman Horn, MD as Consulting Physician (Ophthalmology) [name], RN as [place] [facility] Mayford Knife, [facility] (Pharmacist)  Recent office visits: 10/07/2021 PCP OV 09/01/2021 PCP OV  Recent consult visits: 10/04/2021 Cardiology [facility] visits: None in previous 6 months   Objective:  Lab Results  Component Value Date   CREATININE 1.75 (H) 10/07/2021   BUN 29 (H) 10/07/2021   GFRNONAA 26 (L) 08/29/2021   GFRAA 28 (L) 09/08/2020   NA 141 10/07/2021   K 4.7 10/07/2021    CALCIUM 9.6 10/07/2021   CO2 27 10/07/2021   GLUCOSE 181 (H) 10/07/2021    Lab Results  Component Value Date/Time   HGBA1C 9.8 (H) 10/07/2021 04:16 PM   HGBA1C 9.0 (H) 07/19/2021 11:43 AM   MICROALBUR 30 07/19/2021 04:17 PM   MICROALBUR 30 02/17/2021 02:00 PM    Last diabetic Eye exam:  Lab Results  Component Value Date/Time   HMDIABEYEEXA Retinopathy (A) 02/18/2021 12:00 AM    Last diabetic Foot exam: No results found for: HMDIABFOOTEX   Lab Results  Component Value Date   CHOL 145 07/19/2021   HDL 62 07/19/2021   LDLCALC 66 07/19/2021   TRIG 94 07/19/2021   CHOLHDL 2.3 07/19/2021    Hepatic Function Latest Ref Rng & Units 08/29/2021 07/19/2021 02/17/2021  Total Protein 6.5 - 8.1 g/dL 7.3 7.3 7.8  Albumin 3.5 - 5.0 g/dL 3.8 4.4 4.6  AST 15 - 41 U/L _0 ALT 0 - 44 U/L _1 Alk Phosphatase 38 - 126 U/L 61 62 57  Total Bilirubin 0.3 - 1.2 mg/dL 0.6 0.2 0.3  Bilirubin, Direct 0.00 - 0.40 mg/dL - - -    Lab Results  Component Value Date/Time   TSH 0.934 07/19/2021 11:43 AM   TSH 1.150 05/07/2019 10:55 AM    CBC Latest Ref Rng & Units 08/29/2021 08/29/2021 07/19/2021  WBC 4.0 - 10.5 K/uL - 6.7 5.4  Hemoglobin 12.0 - 15.0 g/dL 12.9 12.4 11.4  Hematocrit 36.0 - 46.0 % 38.0 37.8 34.1  Platelets 150 - 400 K/uL - 252 249  Lab Results  Component Value Date/Time   VD25OH 59.2 02/13/2020 01:36 PM    Clinical ASCVD: Yes  The ASCVD Risk score (Arnett DK, et al., 2019) failed to calculate for the following reasons:   The patient has a prior MI or stroke diagnosis    Depression screen [facility] 2/9 02/17/2021 10/12/2020 02/14/2020  Decreased Interest 0 0 0  Down, Depressed, Hopeless 0 0 0  PHQ - 2 Score 0 0 0  Altered sleeping - - -  Tired, decreased energy - - -  Change in appetite - - -  Feeling bad or failure about yourself  - - -  Trouble concentrating - - -  Moving slowly or fidgety/restless - - -  Suicidal thoughts - - -  PHQ-9 Score - - -  Difficult  doing work/chores - - -      Social History   Tobacco Use  Smoking Status Never  Smokeless Tobacco Never   BP Readings from Last 3 Encounters:  10/07/21 102/84  10/04/21 134/90  09/01/21 112/60   Pulse Readings from Last 3 Encounters:  10/07/21 79  10/04/21 63  08/28/21 75   Wt Readings from Last 3 Encounters:  10/07/21 163 lb 3.2 oz (74 kg)  10/04/21 161 lb 4.8 oz (73.2 kg)  09/01/21 160 lb 9.6 oz (72.8 kg)   BMI Readings from Last 3 Encounters:  10/07/21 27.16 kg/m  10/04/21 26.84 kg/m  09/01/21 26.73 kg/m    Assessment/Interventions: Review of patient past medical history, allergies, medications, health status, including review of consultants reports, laboratory and other test data, was performed as part of comprehensive evaluation and provision of chronic care management services.   SDOH:  (Social Determinants of Health) assessments and interventions performed: Yes  SDOH Screenings   Alcohol Screen: Low Risk    Last Alcohol Screening Score (AUDIT): 0  Depression (PHQ2-9): Low Risk    PHQ-2 Score: 0  Financial Resource Strain: Medium Risk   Difficulty of Paying Living Expenses: Somewhat hard  Food Insecurity: No Food Insecurity   Worried About Charity fundraiser in the Last Year: Never true   Ran Out of Food in the Last Year: Never true  Housing: Low Risk    Last Housing Risk Score: 0  Physical Activity: Inactive   Days of Exercise per Week: 0 days   Minutes of Exercise per Session: 0 min  Social Connections: Not on file  Stress: No Stress Concern Present   Feeling of Stress : Not at all  Tobacco Use: Low Risk    Smoking Tobacco Use: Never   Smokeless Tobacco Use: Never   Passive Exposure: Not on file  Transportation Needs: No Transportation Needs   Lack of Transportation (Medical): No   Lack of Transportation (Non-Medical): No    CCM Care Plan  No Known Allergies  Medications Reviewed Today     Reviewed by [name], MD (Physician) on  10/07/21 at 1556  Med List Status: <None>   Medication Order Taking? Sig Documenting Provider Last Dose Status Informant  atorvastatin (LIPITOR) 40 MG tablet [id number] Yes Take 1 tablet by mouth Monday Wed Friday [name], MD Taking Active   Continuous Blood Gluc Receiver (FREESTYLE LIBRE 14 DAY READER) [place] [id number] Yes 1 each by Does not apply route QID. [name], MD Taking Active   Continuous Blood Gluc Sensor (FREESTYLE LIBRE [place]) [place] [id number] Yes Use as directed to check blood sugars 4 times per day dx: e11.65 [name], MD Taking  Active   insulin aspart (FIASP FLEXTOUCH) 100 UNIT/ML FlexTouch Pen [id number] Yes INJECT INTO SKIN SUBCUTANEOUS BEFORE MEALS PER SLIDING SCALE Minette Brine, FNP Taking Active   insulin detemir (LEVEMIR FLEXTOUCH) 100 UNIT/ML FlexPen [id number] Yes Inject 22 units subcutaneously daily at bedtime, max dose 60 units [name], MD Taking Active   Insulin Pen Needle (PEN NEEDLES 3/16") 31G X 5 MM MISC [id number] Yes Use as directed, once daily. [name], MD Taking Active   Olmesartan-amLODIPine-HCTZ 40-5-25 MG TABS [id number] Yes TAKE ONE TABLET BY MOUTH EVERY DAY [name], MD Taking Active   tamoxifen (NOLVADEX) 20 MG tablet [id number] Yes Take 1 tablet (20 mg total) by mouth daily. Nicholas Lose, MD Taking Active   Vitamin D, Cholecalciferol, 50 MCG (2000 UT) CAPS [id number] Yes Take 2,000 Units by mouth daily.  [provider] Taking Active Self            Patient Active Problem List   Diagnosis Date Noted   Recurrent UTI 10/11/2021   Acute cystitis without hematuria 07/25/2021   Palpitations 07/25/2021   Posterior vitreous detachment of right eye 02/16/2021   Aortic atherosclerosis ([place]) 11/28/2020   Overweight (BMI 25.0-29.9) 11/28/2020   Moderate nonproliferative diabetic retinopathy of both eyes without macular edema associated with type 2 diabetes mellitus ([place]) 05/19/2020   Vitreomacular adhesion of  left eye 05/19/2020   Nuclear sclerotic cataract of both eyes 05/19/2020   Memory deficit 02/24/2020   Stroke (cerebrum) ([place]) 02/24/2020   AKI (acute kidney injury) ([place])    Thrush    Hyperglycemia 01/10/2020   Hypertension 01/10/2020   Acute lower UTI 01/10/2020   Malignant neoplasm of upper-outer quadrant of left breast in female, estrogen receptor positive ([place]) 12/16/2019   Ductal carcinoma in situ (DCIS) of left breast 07/17/2019   Type 2 diabetes mellitus with stage 3b chronic kidney disease, with long-term current use of insulin ([place]) 11/08/2018   Chronic renal disease, stage III ([place]) 11/08/2018   Parenchymal renal hypertension 11/08/2018   Pure hypercholesterolemia 11/08/2018   Spider veins of both lower extremities 08/15/2017    Immunization History  Administered Date(s) Administered   DTaP 08/03/2007, 10/14/2014   Fluad Quad(high Dose 65+) 09/08/2020, 09/01/2021   Influenza, High Dose Seasonal PF 11/06/2019   PFIZER(Purple Top)SARS-COV-2 Vaccination 01/24/2020, 02/19/2020, 11/23/2020   Pneumococcal Conjugate-13 03/03/2014   Pneumococcal Polysaccharide-23 01/30/2016, 02/05/2019   Zoster, Live 03/16/2001    Conditions to be addressed/monitored:  Hyperlipidemia and Diabetes  Care Plan : [place]  Updates made by Mayford Knife, [place] since 10/13/2021 12:00 AM     Problem: HLD, DM II   Priority: High  Onset Date: 02/24/2021     Long-Range Goal: Disease Management   Start Date: 02/24/2021  Recent Progress: On track  Priority: High  Note:   Current Barriers:  Unable to achieve control of diabetes    Pharmacist Clinical Goal(s):  Patient will achieve adherence to monitoring guidelines and medication adherence to achieve therapeutic efficacy achieve control of diabetes as evidenced by A1c through collaboration with PharmD and provider.   Interventions: 1:1 collaboration with [name], MD regarding development and update of comprehensive  plan of care as evidenced by provider attestation and co-signature Inter-disciplinary care team collaboration (see longitudinal plan of care) Comprehensive medication review performed; medication list updated in electronic medical record  Hyperlipidemia: (LDL goal < 70) -Controlled -Current treatment: Atorvastatin 40 mg tablet once per day  -Current dietary patterns: continue to limit the amount of fried and  fatty foods -Current exercise habits: patient reports that [facility]  -Educated on Cholesterol goals;  Benefits of statin for ASCVD risk reduction; Importance of limiting foods high in cholesterol; -Counseled on diet and exercise extensively  Diabetes (A1c goal <7%) -Uncontrolled -Current medications: Fiasp - using with meals on a sliding scale, using once a day around dinner Levemir- 24 units at night prior to bed  -Current home glucose readings: continuous glucose monitor fasting glucose: 112-90 -Denies hypoglycemic/hyperglycemic symptoms -Current meal patterns:  breakfast: one egg  lunch: patient reports rarely eating any rice, [employer] [place] Salad, recommended patient change the salad dressing that she is using  dinner: eating mostly salmon and vegetables  snacks: sometimes dessert  drinks: she is drinking coffee with sweetener, she is drinking ginger ale with water-because sometimes she wants a different taste  -Current exercise: she has not [facility]  -Educated on A1c and blood sugar goals; Complications of diabetes including kidney damage, retinal damage, and cardiovascular disease; Carbohydrate counting and/or plate method -Counseled to check feet daily and get yearly eye exams -Collaborate with PCP to start patient on Ozempic 0.25 mg once per week  -Recommended to continue current medication  Patient Goals/Self-Care Activities Patient will:  - take medications as prescribed as evidenced by patient  report and record review  Follow Up Plan: The patient has been provided with contact information for the care management team and has been advised to call with any health related questions or concerns.       Medication Assistance: None required.  Patient affirms current coverage meets needs.  Compliance/Adherence/Medication fill history: Care Gaps: COVID-19 Booster Shingrix Vaccine   Star-Rating Drugs: Atorvastatin 40 mg tablet  Olmesartan - Amlodipine -HCTZ 40-5-25 mg  Ozempic 0.25 mg  [facility]  [id number] [address] [place] [address]Phone: [phone]: [phone] [facility] [place] [place] [address]Phone: [phone]: [phone] [place]Phone: [phone]: [phone] [id number] [place] [address] [place]one: [phone]: [phone] pill box? Yes Pt endorses 85% compliance  We discussed: Benefits of medication synchronization, packaging and delivery as well as enhanced pharmacist oversight with [employer]. Patient decided to: Continue current medication management strategy  Care Plan and Follow Up Patient Decision:  Patient agrees to Care Plan and Follow-up.  Plan: The patient has been provided with contact information for the care management team and has been advised to call with any health related questions or concerns.  Next PCP appointment scheduled for:  11/30/2021  VaOrlando PennerCPP, PharmD Clinical Pharmacist Practitioner [facility] [phone]

## 2021-10-13 NOTE — Patient Instructions (Addendum)
Visit Information It was great speaking with you today!  Please let me know if you have any questions about our visit.   Goals Addressed             This Visit's Progress    Manage My Medicine       Timeframe:  Long-Range Goal Priority:  High Start Date:                             Expected End Date:                       Follow Up Date 10/13/2021  In Progress:  - keep a list of all the medicines I take; vitamins and herbals too - learn to read medicine labels - use a pillbox to sort medicine - use an alarm clock or phone to remind me to take my medicine    Why is this important?   These steps will help you keep on track with your medicines.  Notes: Please call with any questions you have.         Current Barriers:  Unable to achieve control of diabetes    Pharmacist Clinical Goal(s):  Patient will achieve adherence to monitoring guidelines and medication adherence to achieve therapeutic efficacy achieve control of diabetes as evidenced by A1c through collaboration with PharmD and provider.   Interventions: 1:1 collaboration with [name], MD regarding development and update of comprehensive plan of care as evidenced by provider attestation and co-signature Inter-disciplinary care team collaboration (see longitudinal plan of care) Comprehensive medication review performed; medication list updated in electronic medical record  Hyperlipidemia: (LDL goal < 70) -Controlled -Current treatment: Atorvastatin 40 mg tablet once per day  -Current dietary patterns: continue to limit the amount of fried and fatty foods -Current exercise habits: patient reports that she is [facility]  -Educated on Cholesterol goals;  Benefits of statin for ASCVD risk reduction; Importance of limiting foods high in cholesterol; -Counseled on diet and exercise extensively  Diabetes (A1c goal <7%) -Uncontrolled -Current medications: Fiasp -  using with meals on a sliding scale, using once a day around dinner Levemir- 24 units at night prior to bed  -Current home glucose readings: continuous glucose monitor fasting glucose: 112-90 -Denies hypoglycemic/hyperglycemic symptoms -Current meal patterns:  breakfast: one egg  lunch: patient reports rarely eating any rice, [employer] [place] Salad, recommended patient change the salad dressing that she is using  dinner: eating mostly salmon and vegetables  snacks: sometimes dessert  drinks: she is drinking coffee with sweetener, she is drinking ginger ale with water-because sometimes she wants a different taste  -Current exercise: she [facility] not [facility]  -Educated on A1c and blood sugar goals; Complications of diabetes including kidney damage, retinal damage, and cardiovascular disease; Carbohydrate counting and/or plate method -Counseled to check feet daily and get yearly eye exams -Collaborate with PCP to start patient on Ozempic 0.25 mg once per week, we discussed proper injection technique and patient was counseled thoroughly on medication  -Recommended to continue current medication  Patient Goals/Self-Care Activities Patient will:  - take medications as prescribed as evidenced by patient report and record review  Follow Up Plan: The patient has been provided with contact information for the care management team and has been advised to call with any health related questions or concerns. Follow Up  visit with PCP team scheduled for 11/30/2021   Patient agreed to services and verbal consent obtained.   The patient verbalized understanding of instructions, educational materials, and care plan provided today and agreed to receive a mailed copy of patient instructions, educational materials, and care plan.   [name], PharmD Clinical Pharmacist [facility] [phone]

## 2021-10-18 ENCOUNTER — Telehealth: Payer: Medicare PPO

## 2021-10-18 DIAGNOSIS — R350 Frequency of micturition: Secondary | ICD-10-CM | POA: Diagnosis not present

## 2021-10-18 DIAGNOSIS — Z87440 Personal history of urinary (tract) infections: Secondary | ICD-10-CM | POA: Diagnosis not present

## 2021-10-18 NOTE — Chronic Care Management (AMB) (Signed)
  Care Management   Note  10/18/2021 Name: Michele Wang MRN: [id number] DOB: [birth year]  Michele Wang is a 70 y.o. year old female who is a primary care patient of [name], MD and is actively engaged with the care management team. I reached out to Nicola Police by phone today to assist with re-scheduling a follow up visit with the RN Case Manager  Follow up plan: A second unsuccessful telephone outreach attempt made. A HIPAA compliant phone message was left for the patient providing contact information and requesting a return call. The care management team will reach out to the patient again over the next 7 days. If patient returns call to provider office, please advise to call [place] at [phone].  [place] Management  Direct Dial: [phone]

## 2021-10-19 ENCOUNTER — Other Ambulatory Visit: Payer: Self-pay | Admitting: Internal Medicine

## 2021-10-25 NOTE — Chronic Care Management (AMB) (Signed)
  Care Management   Note  10/25/2021 Name: Michele Wang MRN: [id number] DOB: 1951/11/28  Michele Wang is a 70 y.o. year old female who is a primary care patient of [name], MD and is actively engaged with the care management team. I reached out to Nicola Police by phone today to assist with re-scheduling a follow up visit with the RN Case Manager  Follow up plan: Telephone appointment with care management team member scheduled for:12/08/20  [place] Management  Direct Dial: [phone]

## 2021-10-27 DIAGNOSIS — Z7984 Long term (current) use of oral hypoglycemic drugs: Secondary | ICD-10-CM

## 2021-10-27 DIAGNOSIS — E1169 Type 2 diabetes mellitus with other specified complication: Secondary | ICD-10-CM | POA: Diagnosis not present

## 2021-10-27 DIAGNOSIS — E785 Hyperlipidemia, unspecified: Secondary | ICD-10-CM | POA: Diagnosis not present

## 2021-11-15 ENCOUNTER — Other Ambulatory Visit: Payer: Self-pay | Admitting: Internal Medicine

## 2021-11-18 ENCOUNTER — Ambulatory Visit (INDEPENDENT_AMBULATORY_CARE_PROVIDER_SITE_OTHER): Payer: Medicare PPO | Admitting: Ophthalmology

## 2021-11-18 ENCOUNTER — Encounter (INDEPENDENT_AMBULATORY_CARE_PROVIDER_SITE_OTHER): Payer: Self-pay | Admitting: Ophthalmology

## 2021-11-18 ENCOUNTER — Other Ambulatory Visit: Payer: Self-pay

## 2021-11-18 ENCOUNTER — Encounter (INDEPENDENT_AMBULATORY_CARE_PROVIDER_SITE_OTHER): Payer: Medicare PPO | Admitting: Ophthalmology

## 2021-11-18 DIAGNOSIS — E113393 Type 2 diabetes mellitus with moderate nonproliferative diabetic retinopathy without macular edema, bilateral: Secondary | ICD-10-CM

## 2021-11-18 DIAGNOSIS — H2513 Age-related nuclear cataract, bilateral: Secondary | ICD-10-CM

## 2021-11-18 NOTE — Assessment & Plan Note (Signed)
The nature of moderate nonproliferative diabetic retinopathy was discussed with the patient as well as the need for more frequent follow up to judge for progression. Good blood glucose, blood pressure, and serum lipid control was recommended as well as avoidance of smoking and maintenance of normal weight.  Close follow up with PCP encouraged. °

## 2021-11-18 NOTE — Assessment & Plan Note (Signed)
Bilateral progression of NSC changes.  Patient understands at nighttime vision difficulties and/or driving on dark rainy days are a symptom of such cataracts and that only cataract surgery with clear lens implantation can improve those symptoms should she develop complaints

## 2021-11-18 NOTE — Progress Notes (Signed)
11/18/2021     CHIEF COMPLAINT Patient presents for  Chief Complaint  Patient presents with   Retina Follow Up    9 Mo F/U OU  Pt denies noticeable changes to [place] OU since last visit. Pt denies ocular pain, flashes of light, or floaters OU.  A1c: 10, 3 mo ago approx LBS: 160 this AM      HISTORY OF PRESENT ILLNESS: Michele Wang is a 70 y.o. female who presents to the clinic today for:   HPI     Retina Follow Up           Diagnosis: Diabetic Retinopathy   Laterality: both eyes   Onset: 9 months ago   Severity: mild   Duration: 9 months   Course: stable   Comments: 9 Mo F/U OU  Pt denies noticeable changes to [place] OU since last visit. Pt denies ocular pain, flashes of light, or floaters OU.  A1c: 10, 3 mo ago approx LBS: 160 this AM         Comments   9 mos fu OU oct. Patient states vision is stable and unchanged since last visit. Denies any new floaters or FOL.       Last edited by Laurin Coder on 11/18/2021  8:08 AM.      Referring physician: [name], MD [address] [place],  [place] [address]  HISTORICAL INFORMATION:   Selected notes from the MEDICAL RECORD NUMBER    Lab Results  Component Value Date   HGBA1C 9.8 (H) 10/07/2021     CURRENT MEDICATIONS: No current outpatient medications on file. (Ophthalmic Drugs)   No current facility-administered medications for this visit. (Ophthalmic Drugs)   Current Outpatient Medications (Other)  Medication Sig   atorvastatin (LIPITOR) 40 MG tablet Take 1 tablet by mouth Monday Wed Friday   Continuous Blood Gluc Receiver (FREESTYLE LIBRE 14 DAY READER) DEVI 1 each by Does not apply route QID.   Continuous Blood Gluc Sensor (FREESTYLE LIBRE 14 DAY SENSOR) MISC Use as directed to check blood sugars 4 times per day dx: e11.65   insulin aspart (FIASP FLEXTOUCH) 100 UNIT/ML FlexTouch Pen INJECT INTO SKIN SUBCUTANEOUS BEFORE MEALS PER SLIDING SCALE   insulin detemir (LEVEMIR FLEXTOUCH)  100 UNIT/ML FlexPen Inject 22 units subcutaneously daily at bedtime, max dose 60 units   Insulin Pen Needle (PEN NEEDLES 3/16") 31G X 5 MM MISC Use as directed, once daily.   Olmesartan-amLODIPine-HCTZ 40-5-25 MG TABS TAKE ONE TABLET BY MOUTH EVERY DAY   Semaglutide,0.25 or 0.5MG /DOS, (OZEMPIC, 0.25 OR 0.5 MG/DOSE,) 2 MG/1.5ML SOPN Inject 0.5 mg into the skin once a week.   tamoxifen (NOLVADEX) 20 MG tablet Take 1 tablet (20 mg total) by mouth daily.   Vitamin D, Cholecalciferol, 50 MCG (2000 UT) CAPS Take 2,000 Units by mouth daily.    No current facility-administered medications for this visit. (Other)      REVIEW OF SYSTEMS:    ALLERGIES No Known Allergies  PAST MEDICAL HISTORY Past Medical History:  Diagnosis Date   Breast cancer ([place])    CKD (chronic kidney disease)    Diabetes mellitus without complication ([place])    Type II   Hyperlipidemia    Hypertension    UTI (urinary tract infection)    Past Surgical History:  Procedure Laterality Date   BREAST LUMPECTOMY WITH RADIOACTIVE SEED LOCALIZATION Left 08/21/2019   Procedure: LEFT BREAST LUMPECTOMY WITH RADIOACTIVE SEED LOCALIZATION;  Surgeon: Jovita Kussmaul, MD;  Location:  [facility];  Service: General;  Laterality: Left;   CESAREAN SECTION     x2    FAMILY HISTORY Family History  Problem Relation Age of Onset   Aneurysm Mother    Healthy Father     SOCIAL HISTORY Social History   Tobacco Use   Smoking status: Never   Smokeless tobacco: Never  Vaping Use   Vaping Use: Never used  Substance Use Topics   Alcohol use: No   Drug use: No         OPHTHALMIC EXAM:  Base Eye Exam     Visual Acuity (ETDRS)       Right Left   Dist cc 20/25 -2 20/30 -2    Correction: Glasses         Tonometry (Tonopen, 8:12 AM)       Right Left   Pressure 17 16         Pupils       Pupils Dark Light APD   Right PERRL 4 3 None   Left PERRL 4 3 None         Visual Fields (Counting  fingers)       Left Right    Full Full         Extraocular Movement       Right Left    Full Full         Neuro/Psych     Oriented x3: Yes   Mood/Affect: Normal         Dilation     Both eyes: 1.0% Mydriacyl, 2.5% Phenylephrine @ 8:12 AM           Slit Lamp and Fundus Exam     External Exam       Right Left   External Normal Normal         Slit Lamp Exam       Right Left   Lids/Lashes Normal Normal   Conjunctiva/Sclera White and quiet White and quiet   Cornea Clear Clear   Anterior Chamber Deep and quiet Deep and quiet   Iris Round and reactive Round and reactive   Lens 2+ Nuclear sclerosis 2+ Nuclear sclerosis   Anterior Vitreous Normal Normal         Fundus Exam       Right Left   Posterior Vitreous ,, Posterior vitreous detachment Normal   Disc Normal Normal   C/D Ratio 0.45 0.5   Macula ,, no exudates, no macular thickening, no clinically significant macular edema, Microaneurysms ,, no exudates, no macular thickening, Microaneurysms, no clinically significant macular edema   Vessels NPDR- Moderate NPDR- Moderate   Periphery Normal Normal            IMAGING AND PROCEDURES  Imaging and Procedures for 11/18/21  OCT, Retina - OU - Both Eyes       Right Eye Quality was good. Scan locations included subfoveal. Central Foveal Thickness: 271. Progression has been stable. Findings include normal foveal contour.   Left Eye Quality was good. Scan locations included subfoveal. Central Foveal Thickness: 244. Progression has been stable. Findings include normal foveal contour, vitreomacular adhesion .   Notes Incidental posterior vitreous detachment right eye  VMA is present left eye with no topographic distortion observed  No active maculopathy             ASSESSMENT/PLAN:  Nuclear sclerotic cataract of both eyes Bilateral progression of NSC changes.  Patient understands at nighttime vision difficulties and/or  driving on  dark rainy days are a symptom of such cataracts and that only cataract surgery with clear lens implantation can improve those symptoms should she develop complaints  Moderate nonproliferative diabetic retinopathy of both eyes without macular edema associated with type 2 diabetes mellitus ([place]) The nature of moderate nonproliferative diabetic retinopathy was discussed with the patient as well as the need for more frequent follow up to judge for progression. Good blood glucose, blood pressure, and serum lipid control was recommended as well as avoidance of smoking and maintenance of normal weight.  Close follow up with PCP encouraged.      ICD-10-CM   1. Moderate nonproliferative diabetic retinopathy of both eyes without macular edema associated with type 2 diabetes mellitus (HCC)  W46.6599 OCT, Retina - OU - Both Eyes    2. Nuclear sclerotic cataract of both eyes  H25.13       1.  Stable moderate nonproliferative diabetic retinopathy in each eye  2.  Progression of [place] changes OU follow-up with Dr. Katy Fitch as scheduled  3.  Ophthalmic Meds Ordered this visit:  No orders of the defined types were placed in this encounter.      Return in about 9 months (around 08/19/2022) for DILATE OU, COLOR FP, OCT.  There are no Patient Instructions on file for this visit.   Explained the diagnoses, plan, and follow up with the patient and they expressed understanding.  Patient expressed understanding of the importance of proper follow up care.   Clent Demark Edinson Domeier M.D. Diseases & Surgery of the Retina and Vitreous [facility] 11/18/21     Abbreviations: M myopia (nearsighted); A astigmatism; H hyperopia (farsighted); P presbyopia; Mrx spectacle prescription;  CTL contact lenses; OD right eye; OS left eye; OU both eyes  XT exotropia; ET esotropia; PEK punctate epithelial keratitis; PEE punctate epithelial erosions; DES dry eye syndrome; MGD meibomian gland dysfunction; ATs  artificial tears; PFAT's preservative free artificial tears; [place] nuclear sclerotic cataract; PSC posterior subcapsular cataract; ERM epi-retinal membrane; PVD posterior vitreous detachment; RD retinal detachment; DM diabetes mellitus; DR diabetic retinopathy; NPDR non-proliferative diabetic retinopathy; PDR proliferative diabetic retinopathy; CSME clinically significant macular edema; DME diabetic macular edema; dbh dot blot hemorrhages; CWS cotton wool spot; POAG primary open angle glaucoma; C/D cup-to-disc ratio; HVF humphrey visual field; GVF goldmann visual field; OCT optical coherence tomography; IOP intraocular pressure; BRVO Branch retinal vein occlusion; CRVO central retinal vein occlusion; CRAO central retinal artery occlusion; BRAO branch retinal artery occlusion; RT retinal tear; SB scleral buckle; PPV pars plana vitrectomy; VH Vitreous hemorrhage; PRP panretinal laser photocoagulation; IVK intravitreal kenalog; VMT vitreomacular traction; MH Macular hole;  NVD neovascularization of the disc; NVE neovascularization elsewhere; AREDS age related eye disease study; ARMD age related macular degeneration; POAG primary open angle glaucoma; EBMD epithelial/anterior basement membrane dystrophy; ACIOL anterior chamber intraocular lens; IOL intraocular lens; PCIOL posterior chamber intraocular lens; Phaco/IOL phacoemulsification with intraocular lens placement; [place] photorefractive keratectomy; LASIK laser assisted in situ keratomileusis; HTN hypertension; DM diabetes mellitus; COPD chronic obstructive pulmonary disease

## 2021-11-23 ENCOUNTER — Ambulatory Visit (INDEPENDENT_AMBULATORY_CARE_PROVIDER_SITE_OTHER): Payer: Medicare PPO

## 2021-11-23 DIAGNOSIS — I10 Essential (primary) hypertension: Secondary | ICD-10-CM

## 2021-11-23 DIAGNOSIS — E1122 Type 2 diabetes mellitus with diabetic chronic kidney disease: Secondary | ICD-10-CM

## 2021-11-23 NOTE — Patient Instructions (Signed)
Visit Information It was great speaking with you today!  Please let me know if you have any questions about our visit.   Goals Addressed             This Visit's Progress    Manage My Medicine       Timeframe:  Long-Range Goal Priority:  High Start Date:                             Expected End Date:                       Follow Up Date 02/08/2022  In Progress:  - call for medicine refill 2 or 3 days before it runs out - keep a list of all the medicines I take; vitamins and herbals too - learn to read medicine labels - use a pillbox to sort medicine - use an alarm clock or phone to remind me to take my medicine    Why is this important?   These steps will help you keep on track with your medicines.  Notes: Please call with any questions you have.          Patient Care Plan: CCM Pharmacy Care Plan     Problem Identified: HTN, DM II   Priority: High  Onset Date: 02/24/2021     Long-Range Goal: Disease Management   Start Date: 02/24/2021  Recent Progress: On track  Priority: High  Note:     Current Barriers:  Unable to maintain control of diabetes Does not contact provider office for questions/concerns Unable to achieve control due [facility].   Pharmacist Clinical Goal(s):  Patient will achieve adherence to monitoring guidelines and medication adherence to achieve therapeutic efficacy through collaboration with PharmD and provider.   Interventions: 1:1 collaboration with [name], MD regarding development and update of comprehensive plan of care as evidenced by provider attestation and co-signature Inter-disciplinary care team collaboration (see longitudinal plan of care) Comprehensive medication review performed; medication list updated in electronic medical record  Hypertension (BP goal <130/80) -Controlled -Current treatment: Olmesartan -Amlodipine-Hctz (40-5-25 mg tablet)- taking 1 tablet daily. -Current  home readings: 120/70-118/80 -Current dietary habits: she is increasing her vegetables, she is limiting the amount of salt that she eats. She is not seasoning food twice, she is only adding seasoning while she is cooking. They no longer buy salt.  -Current exercise habits: please see diabetes -Denies hypotensive/hypertensive symptoms -Educated on BP goals and benefits of medications for prevention of heart attack, stroke and kidney damage; Exercise goal of 150 minutes per week; Importance of home blood pressure monitoring; Proper BP monitoring technique; -Counseled to monitor BP at home at least twice per day, document, and provide log at future appointments -Recommended to continue current medication  Diabetes (A1c goal <7%) -Uncontrolled -Current medications: Fiasp Flextouch - inject into the skin before meals per sliding scale She has had to use the sliding scale once or twice per week.  It depends on what she is eating.  Levemir 100 unit/ml - injecting 22 units daily at bedtime, max dose at 60 units  Ozempic 0.5 mg into the skin once a week  Patient reports that she did not get to use the medication because [facility]  She is waiting to hear back from them, it has been a month  -Current home glucose readings: patient  reports her BS readings have been between 90-112-120  -Denies hypoglycemic/hyperglycemic symptoms -Current meal patterns: patient reports that she has been off track since the christmas holidays  breakfast: normally eating organic egg and [place] sausage   lunch: she rarely eats lunch  dinner: patient has increased her vegetables including brussels sprouts, collards, cabbage and baked chicken She is being mindful of the amount of carbohydrates that she is eating  snacks: she has enjoyed some snacks during christmas and the holidays are hard to keep track with  drinks: increased the amount of water she is drinking  -Current exercise: patient reports  that she increased her exercise with walking at the [facility], and she parks her car far from where she is going so can get extra steps in.  -Educated on A1c and blood sugar goals; Complications of diabetes including kidney damage, retinal damage, and cardiovascular disease; Prevention and management of hypoglycemic episodes; Benefits of routine self-monitoring of blood sugar; -Patient was unaware that she could reach out to the pharmacy team here because [facility], I encouraged the patient to call when she has any questions.  -Patient reports that she has not received her CGM supplies for the next 3 months and she is going to contact [facility] to check feet daily and get yearly eye exams -Counseled on the importance of reaching out if she has issues with getting her medication.   Patient Goals/Self-Care Activities Patient will:  - focus on medication adherence by contacting pharmacy team with any issues on getting medication or diabetes testing supplies. check glucose at least three times per day, document, and provide at future appointments  Follow Up Plan: The patient has been provided with contact information for the care management team and has been advised to call with any health related questions or concerns.  Next PCP appointment scheduled for:   11/30/2021       Patient agreed to services and verbal consent obtained.   The patient verbalized understanding of instructions, educational materials, and care plan provided today and agreed to receive a mailed copy of patient instructions, educational materials, and care plan.   [name], PharmD Clinical Pharmacist [facility] [phone]

## 2021-11-23 NOTE — Progress Notes (Signed)
Chronic Care Management Pharmacy Note  11/23/2021 Name:  AIYAH SCARPELLI MRN:  [id number] DOB:  22-Dec-1950  Summary: Patient reports that she has not had a refill of the freestyle libre for a few weeks. She usually receives enough for 90 days.   Recommendations/Changes made from today's visit: [facility],  [facility] to determine what is going on with the Ozempic-she [facility] from the [facility] but did not receive the medication.  [facility] has not sent her the [employer], she is due for a refill and has not received it yet.   Plan: Patient is going to follow up with the CCM team and let us know what steps we need to take to help patient with medication access. She has already made plans to call the [facility] and [facility] in regards to her medication.    Subjective: MONTEZ STRYKER is an 70 y.o. year old female who is a primary patient of [name], MD.  The CCM team was consulted for assistance with disease management and care coordination needs.    Engaged with patient by telephone for follow up visit in response to provider referral for pharmacy case management and/or care coordination services.   Consent to Services:  The patient was given information about Chronic Care Management services, agreed to services, and gave verbal consent prior to initiation of services.  Please see initial visit note for detailed documentation.   Patient Care Team: [name], MD as PCP - General (Internal Medicine) Kyung Rudd, MD as Consulting Physician (Radiation Oncology) Jovita Kussmaul, MD as Consulting Physician (General Surgery) Nicholas Lose, MD as Consulting Physician (Hematology and Oncology) Zadie Rhine Clent Demark, MD as Consulting Physician (Ophthalmology) [name], RN as [place] Management Mayford Knife, [facility] (Pharmacist)  Recent office visits: 10/07/2021 PCP OV  Recent consult visits: 11/18/2021 Retina OV 10/04/2021 Cardiology  [facility] visits: None in previous 6 months   Objective:  Lab Results  Component Value Date   CREATININE 1.75 (H) 10/07/2021   BUN 29 (H) 10/07/2021   GFRNONAA 26 (L) 08/29/2021   GFRAA 28 (L) 09/08/2020   NA 141 10/07/2021   K 4.7 10/07/2021   CALCIUM 9.6 10/07/2021   CO2 27 10/07/2021   GLUCOSE 181 (H) 10/07/2021    Lab Results  Component Value Date/Time   HGBA1C 9.8 (H) 10/07/2021 04:16 PM   HGBA1C 9.0 (H) 07/19/2021 11:43 AM   MICROALBUR 30 07/19/2021 04:17 PM   MICROALBUR 30 02/17/2021 02:00 PM    Last diabetic Eye exam:  Lab Results  Component Value Date/Time   HMDIABEYEEXA Retinopathy (A) 02/18/2021 12:00 AM    Last diabetic Foot exam: No results found for: HMDIABFOOTEX   Lab Results  Component Value Date   CHOL 145 07/19/2021   HDL 62 07/19/2021   LDLCALC 66 07/19/2021   TRIG 94 07/19/2021   CHOLHDL 2.3 07/19/2021    Hepatic Function Latest Ref Rng & Units 08/29/2021 07/19/2021 02/17/2021  Total Protein 6.5 - 8.1 g/dL 7.3 7.3 7.8  Albumin 3.5 - 5.0 g/dL 3.8 4.4 4.6  AST 15 - 41 U/L _0 ALT 0 - 44 U/L _1 Alk Phosphatase 38 - 126 U/L 61 62 57  Total Bilirubin 0.3 - 1.2 mg/dL 0.6 0.2 0.3  Bilirubin, Direct 0.00 - 0.40 mg/dL - - -    Lab Results  Component Value Date/Time   TSH 0.934 07/19/2021 11:43 AM  TSH 1.150 05/07/2019 10:55 AM    CBC Latest Ref Rng & Units 08/29/2021 08/29/2021 07/19/2021  WBC 4.0 - 10.5 K/uL - 6.7 5.4  Hemoglobin 12.0 - 15.0 g/dL 12.9 12.4 11.4  Hematocrit 36.0 - 46.0 % 38.0 37.8 34.1  Platelets 150 - 400 K/uL - 252 249    Lab Results  Component Value Date/Time   VD25OH 59.2 02/13/2020 01:36 PM    Clinical ASCVD: Yes  The ASCVD Risk score (Arnett DK, et al., 2019) failed to calculate for the following reasons:   The patient has a prior MI or stroke diagnosis    Depression screen [facility] 2/9 02/17/2021 10/12/2020 02/14/2020  Decreased Interest 0 0 0  Down, Depressed, Hopeless 0 0 0  PHQ - 2 Score 0 0 0   Altered sleeping - - -  Tired, decreased energy - - -  Change in appetite - - -  Feeling bad or failure about yourself  - - -  Trouble concentrating - - -  Moving slowly or fidgety/restless - - -  Suicidal thoughts - - -  PHQ-9 Score - - -  Difficult doing work/chores - - -     Social History   Tobacco Use  Smoking Status Never  Smokeless Tobacco Never   BP Readings from Last 3 Encounters:  10/07/21 102/84  10/04/21 134/90  09/01/21 112/60   Pulse Readings from Last 3 Encounters:  10/07/21 79  10/04/21 63  08/28/21 75   Wt Readings from Last 3 Encounters:  10/07/21 163 lb 3.2 oz (74 kg)  10/04/21 161 lb 4.8 oz (73.2 kg)  09/01/21 160 lb 9.6 oz (72.8 kg)   BMI Readings from Last 3 Encounters:  10/07/21 27.16 kg/m  10/04/21 26.84 kg/m  09/01/21 26.73 kg/m    Assessment/Interventions: Review of patient past medical history, allergies, medications, health status, including review of consultants reports, laboratory and other test data, was performed as part of comprehensive evaluation and provision of chronic care management services.   SDOH:  (Social Determinants of Health) assessments and interventions performed: Yes  SDOH Screenings   Alcohol Screen: Low Risk    Last Alcohol Screening Score (AUDIT): 0  Depression (PHQ2-9): Low Risk    PHQ-2 Score: 0  Financial Resource Strain: Medium Risk   Difficulty of Paying Living Expenses: Somewhat hard  Food Insecurity: No Food Insecurity   Worried About Charity fundraiser in the Last Year: Never true   Ran Out of Food in the Last Year: Never true  Housing: Low Risk    Last Housing Risk Score: 0  Physical Activity: Inactive   Days of Exercise per Week: 0 days   Minutes of Exercise per Session: 0 min  Social Connections: Not on file  Stress: No Stress Concern Present   Feeling of Stress : Not at all  Tobacco Use: Low Risk    Smoking Tobacco Use: Never   Smokeless Tobacco Use: Never   Passive Exposure: Not on  file  Transportation Needs: No Transportation Needs   Lack of Transportation (Medical): No   Lack of Transportation (Non-Medical): No    CCM Care Plan  No Known Allergies  Medications Reviewed Today     Reviewed by Hurman Horn, MD (Physician) on 11/18/21 at [phone]  Med List Status: <None>   Medication Order Taking? Sig Documenting Provider Last Dose Status Informant  atorvastatin (LIPITOR) 40 MG tablet [id number] No Take 1 tablet by mouth Monday Wed Friday [name], MD Taking Active  Continuous Blood Gluc Receiver (FREESTYLE LIBRE 14 DAY READER) DEVI [id number] No 1 each by Does not apply route QID. [name], MD Taking Active   Continuous Blood Gluc Sensor (FREESTYLE LIBRE [place]) [place] [id number] No Use as directed to check blood sugars 4 times per day dx: e11.65 [name], MD Taking Active   insulin aspart (FIASP FLEXTOUCH) 100 UNIT/ML FlexTouch Pen [id number] No INJECT INTO SKIN SUBCUTANEOUS BEFORE MEALS PER SLIDING SCALE Minette Brine, FNP Taking Active   insulin detemir (LEVEMIR FLEXTOUCH) 100 UNIT/ML FlexPen [id number] No Inject 22 units subcutaneously daily at bedtime, max dose 60 units [name], MD Taking Active   Insulin Pen Needle (PEN NEEDLES 3/16") 31G X 5 MM MISC [id number] No Use as directed, once daily. [name], MD Taking Active   Olmesartan-amLODIPine-HCTZ 40-5-25 MG TABS [id number]  TAKE ONE TABLET BY MOUTH EVERY DAY [name], MD  Active   Semaglutide,0.25 or 0.5MG/DOS, (OZEMPIC, 0.25 OR 0.5 MG/DOSE,) 2 MG/1.5ML SOPN [id number]  Inject 0.5 mg into the skin once a week. [name], MD  Active   tamoxifen (NOLVADEX) 20 MG tablet [id number] No Take 1 tablet (20 mg total) by mouth daily. Nicholas Lose, MD Taking Active   Vitamin D, Cholecalciferol, 50 MCG (2000 UT) CAPS [id number] No Take 2,000 Units by mouth daily.  [provider] Taking Active Self            Patient Active Problem List   Diagnosis Date Noted   Recurrent  UTI 10/11/2021   Acute cystitis without hematuria 07/25/2021   Palpitations 07/25/2021   Posterior vitreous detachment of right eye 02/16/2021   Aortic atherosclerosis ([place]) 11/28/2020   Overweight (BMI 25.0-29.9) 11/28/2020   Moderate nonproliferative diabetic retinopathy of both eyes without macular edema associated with type 2 diabetes mellitus ([place]) 05/19/2020   Vitreomacular adhesion of left eye 05/19/2020   Nuclear sclerotic cataract of both eyes 05/19/2020   Memory deficit 02/24/2020   Stroke (cerebrum) ([place]) 02/24/2020   AKI (acute kidney injury) ([place])    Thrush    Hyperglycemia 01/10/2020   Hypertension 01/10/2020   Acute lower UTI 01/10/2020   Malignant neoplasm of upper-outer quadrant of left breast in female, estrogen receptor positive ([place]) 12/16/2019   Ductal carcinoma in situ (DCIS) of left breast 07/17/2019   Type 2 diabetes mellitus with stage 3b chronic kidney disease, with long-term current use of insulin ([place]) 11/08/2018   Chronic renal disease, stage III ([place]) 11/08/2018   Parenchymal renal hypertension 11/08/2018   Pure hypercholesterolemia 11/08/2018   Spider veins of both lower extremities 08/15/2017    Immunization History  Administered Date(s) Administered   DTaP 08/03/2007, 10/14/2014   Fluad Quad(high Dose 65+) 09/08/2020, 09/01/2021   Influenza, High Dose Seasonal PF 11/06/2019   PFIZER(Purple Top)SARS-COV-2 Vaccination 01/24/2020, 02/19/2020, 11/23/2020   Pfizer Covid-19 Vaccine Bivalent Booster 50yr & up 10/15/2021   Pneumococcal Conjugate-13 03/03/2014   Pneumococcal Polysaccharide-23 01/30/2016, 02/05/2019   Zoster, Live 03/16/2001    Conditions to be addressed/monitored:  Hypertension and Diabetes  Care Plan : [place] Updates made by PMayford Knife [place] 11/23/2021 12:00 AM     Problem: HTN, DM II   Priority: High  Onset Date: 02/24/2021     Long-Range Goal: Disease Management   Start Date: 02/24/2021  Recent  Progress: On track  Priority: High  Note:     Current Barriers:  Unable to maintain control of diabetes Does not contact provider office for questions/concerns Unable to achieve  control due to [facility].   Pharmacist Clinical Goal(s):  Patient will achieve adherence to monitoring guidelines and medication adherence to achieve therapeutic efficacy through collaboration with PharmD and provider.   Interventions: 1:1 collaboration with [name], MD regarding development and update of comprehensive plan of care as evidenced by provider attestation and co-signature Inter-disciplinary care team collaboration (see longitudinal plan of care) Comprehensive medication review performed; medication list updated in electronic medical record  Hypertension (BP goal <130/80) -Controlled -Current treatment: Olmesartan -Amlodipine-Hctz (40-5-25 mg tablet)- taking 1 tablet daily. -Current home readings: 120/70-118/80 -Current dietary habits: she is increasing her vegetables, she is limiting the amount of salt that she eats. She is not seasoning food twice, she is only adding seasoning while she is cooking. They no longer buy salt.  -Current exercise habits: please see diabetes -Denies hypotensive/hypertensive symptoms -Educated on BP goals and benefits of medications for prevention of heart attack, stroke and kidney damage; Exercise goal of 150 minutes per week; Importance of home blood pressure monitoring; Proper BP monitoring technique; -Counseled to monitor BP at home at least twice per day, document, and provide log at future appointments -Recommended to continue current medication  Diabetes (A1c goal <7%) -Uncontrolled -Current medications: Fiasp Flextouch - inject into the skin before meals per sliding scale She has had to use the sliding scale once or twice per week.  It depends on what she is eating.  Levemir 100 unit/ml - injecting 22 units daily  at bedtime, max dose at 60 units  Ozempic 0.5 mg into the skin once a week  Patient reports that she did not get to use the medication because there was an issue with the [facility].  She is waiting to hear back from them, it has been a month  -Current home glucose readings: patient reports her BS readings have been between 90-112-120  -Denies hypoglycemic/hyperglycemic symptoms -Current meal patterns: patient reports that she has been off track since the christmas holidays  breakfast: normally eating organic egg and [place] sausage   lunch: she rarely eats lunch  dinner: patient has increased her vegetables including brussels sprouts, collards, cabbage and baked chicken She is being mindful of the amount of carbohydrates that she is eating  snacks: she has enjoyed some snacks during christmas and the holidays are hard to keep track with  drinks: increased the amount of water she is drinking  -Current exercise: patient reports that she increased her exercise with walking at the [facility], and she parks her car far from where she is going so can get extra steps in.  -Educated on A1c and blood sugar goals; Complications of diabetes including kidney damage, retinal damage, and cardiovascular disease; Prevention and management of hypoglycemic episodes; Benefits of routine self-monitoring of blood sugar; -Patient was unaware that she could reach out to the pharmacy team here because she gets her medication through the [facility].  -Patient reports that she has not received her CGM supplies for the next 3 months and she is going to contact [facility] to check feet daily and get yearly eye exams -Counseled on the importance of reaching out if she has issues with getting her medication.   Patient Goals/Self-Care Activities Patient will:  - focus on medication adherence by contacting pharmacy team with any issues on getting medication or diabetes testing supplies. check glucose at least three times per day,  document, and provide at future appointments  Follow Up Plan: The patient has been provided with contact  information for the care management team and has been advised to call with any health related questions or concerns.  Next PCP appointment scheduled for:   11/30/2021      Medication Assistance: None required.  Patient affirms current coverage meets needs.  Compliance/Adherence/Medication fill history: Care Gaps: Shingrix Vaccine Foot Exam   Star-Rating Drugs: Atorvastatin 40 mg tablet once per day  Patient's preferred pharmacy is:  [id number] [address] [place] [address]Phone: [phone]: [phone] [place]- [address] [place] [address]Phone: [phone]: [phone] [place]Phone: [phone]: [phone] [id number] [place] [address] [facility] [place]: [phone]: [phone] pill box? Yes Pt endorses 95% compliance  We discussed: Current pharmacy is preferred with insurance plan and patient is satisfied with pharmacy services Patient decided to: Continue current medication management strategy  Care Plan and Follow Up Patient Decision:  Patient agrees to Care Plan and Follow-up.  Plan: The patient has been provided with contact information for the care management team and has been advised to call with any health related questions or concerns.   VaOrlando PennerCPP, PharmD Clinical Pharmacist Practitioner [facility] [phone]

## 2021-11-24 ENCOUNTER — Telehealth: Payer: Self-pay

## 2021-11-24 NOTE — Chronic Care Management (AMB) (Signed)
° ° °  Chronic Care Management Pharmacy Assistant   Name: TEQUILA ROTTMANN  MRN: [id number] DOB: [birth year]   Reason for Encounter: Medication follow up    Medications: Outpatient Encounter Medications as of 11/24/2021  Medication Sig   atorvastatin (LIPITOR) 40 MG tablet Take 1 tablet by mouth Monday Wed Friday   Continuous Blood Gluc Receiver (FREESTYLE LIBRE 14 DAY READER) DEVI 1 each by Does not apply route QID.   Continuous Blood Gluc Sensor (FREESTYLE LIBRE 14 DAY SENSOR) MISC Use as directed to check blood sugars 4 times per day dx: e11.65   insulin aspart (FIASP FLEXTOUCH) 100 UNIT/ML FlexTouch Pen INJECT INTO SKIN SUBCUTANEOUS BEFORE MEALS PER SLIDING SCALE   insulin detemir (LEVEMIR FLEXTOUCH) 100 UNIT/ML FlexPen Inject 22 units subcutaneously daily at bedtime, max dose 60 units   Insulin Pen Needle (PEN NEEDLES 3/16") 31G X 5 MM MISC Use as directed, once daily.   Olmesartan-amLODIPine-HCTZ 40-5-25 MG TABS TAKE ONE TABLET BY MOUTH EVERY DAY   Semaglutide,0.25 or 0.5MG /DOS, (OZEMPIC, 0.25 OR 0.5 MG/DOSE,) 2 MG/1.5ML SOPN Inject 0.5 mg into the skin once a week.   tamoxifen (NOLVADEX) 20 MG tablet Take 1 tablet (20 mg total) by mouth daily.   Vitamin D, Cholecalciferol, 50 MCG (2000 UT) CAPS Take 2,000 Units by mouth daily.    No facility-administered encounter medications on file as of 11/24/2021.    11-24-2021: Patient was to find out if the [place] covered Ozempic and libre sensor. Patient stated she was unable to get in contact with the [place]. Patient will call when she does.  [place] Pharmacist Assistant [phone]

## 2021-11-27 DIAGNOSIS — I10 Essential (primary) hypertension: Secondary | ICD-10-CM | POA: Diagnosis not present

## 2021-11-27 DIAGNOSIS — N1832 Chronic kidney disease, stage 3b: Secondary | ICD-10-CM

## 2021-11-27 DIAGNOSIS — E1122 Type 2 diabetes mellitus with diabetic chronic kidney disease: Secondary | ICD-10-CM | POA: Diagnosis not present

## 2021-11-30 ENCOUNTER — Other Ambulatory Visit: Payer: Self-pay

## 2021-11-30 ENCOUNTER — Ambulatory Visit (INDEPENDENT_AMBULATORY_CARE_PROVIDER_SITE_OTHER): Payer: Medicare PPO | Admitting: Internal Medicine

## 2021-11-30 ENCOUNTER — Encounter: Payer: Self-pay | Admitting: Internal Medicine

## 2021-11-30 VITALS — BP 114/78 | HR 59 | Temp 98.5°F | Ht 65.0 in | Wt 162.0 lb

## 2021-11-30 DIAGNOSIS — E1122 Type 2 diabetes mellitus with diabetic chronic kidney disease: Secondary | ICD-10-CM | POA: Diagnosis not present

## 2021-11-30 DIAGNOSIS — I129 Hypertensive chronic kidney disease with stage 1 through stage 4 chronic kidney disease, or unspecified chronic kidney disease: Secondary | ICD-10-CM | POA: Diagnosis not present

## 2021-11-30 DIAGNOSIS — E663 Overweight: Secondary | ICD-10-CM | POA: Diagnosis not present

## 2021-11-30 DIAGNOSIS — I700 Atherosclerosis of aorta: Secondary | ICD-10-CM | POA: Diagnosis not present

## 2021-11-30 DIAGNOSIS — Z794 Long term (current) use of insulin: Secondary | ICD-10-CM

## 2021-11-30 DIAGNOSIS — C50412 Malignant neoplasm of upper-outer quadrant of left female breast: Secondary | ICD-10-CM | POA: Diagnosis not present

## 2021-11-30 DIAGNOSIS — Z170 Estrogen receptor positive status [ER+]: Secondary | ICD-10-CM | POA: Diagnosis not present

## 2021-11-30 DIAGNOSIS — N1832 Chronic kidney disease, stage 3b: Secondary | ICD-10-CM | POA: Diagnosis not present

## 2021-11-30 MED ORDER — FREESTYLE LIBRE 14 DAY SENSOR MISC: 11 refills | Status: DC

## 2021-11-30 MED ORDER — OZEMPIC (0.25 OR 0.5 MG/DOSE) 2 MG/1.5ML ~~LOC~~ SOPN: 0.5000 mg | PEN_INJECTOR | SUBCUTANEOUS | 3 refills | Status: DC

## 2021-11-30 MED ORDER — FREESTYLE LIBRE 14 DAY READER DEVI
1.0000 | Freq: Four times a day (QID) | 2 refills | Status: DC
Start: 2021-11-30 — End: 2023-08-21

## 2021-11-30 NOTE — Patient Instructions (Signed)

## 2021-11-30 NOTE — Progress Notes (Signed)
I,Michele Wang,acting as a Education administrator for Michele Greenland, MD.,have documented all relevant documentation on the behalf of Michele Greenland, MD,as directed by  Michele Greenland, MD while in the presence of Michele Greenland, MD.  This visit occurred during the SARS-CoV-2 public health emergency.  Safety protocols were in place, including screening questions prior to the visit, additional usage of staff PPE, and extensive cleaning of exam room while observing appropriate contact time as indicated for disinfecting solutions.  Subjective:     Patient ID: Michele Wang , female    DOB: 09-21-1951 , 71 y.o.   MRN: [id number]   Chief Complaint  Patient presents with   Hypertension   Diabetes    HPI  Pt presents today for BP/Diabetes check. She reports compliance with meds. The patient states she will discuss the zoster vaccination at her next visit.  Hypertension This is a chronic problem. The current episode started more than 1 year ago. The problem has been gradually improving since onset. The problem is controlled. Pertinent negatives include no blurred vision, chest pain, orthopnea, palpitations or shortness of breath. Risk factors for coronary artery disease include diabetes mellitus, dyslipidemia, post-menopausal state and sedentary lifestyle. The current treatment provides moderate improvement. Compliance problems include exercise.  Hypertensive end-organ damage includes kidney disease.  Diabetes She presents for her follow-up diabetic visit. She has type 2 diabetes mellitus. There are no hypoglycemic associated symptoms. There are no diabetic associated symptoms. Pertinent negatives for diabetes include no blurred vision and no chest pain. There are no hypoglycemic complications. Diabetic complications include nephropathy. She is following a diabetic diet. She participates in exercise intermittently.    Past Medical History:  Diagnosis Date   Breast cancer ([place])    CKD (chronic kidney disease)     Diabetes mellitus without complication ([place])    Type II   Hyperlipidemia    Hypertension    UTI (urinary tract infection)      Family History  Problem Relation Age of Onset   Aneurysm Mother    Healthy Father      Current Outpatient Medications:    atorvastatin (LIPITOR) 40 MG tablet, Take 1 tablet by mouth Monday Wed Friday, Disp: 90 tablet, Rfl: 2   insulin aspart (FIASP FLEXTOUCH) 100 UNIT/ML FlexTouch Pen, INJECT INTO SKIN SUBCUTANEOUS BEFORE MEALS PER SLIDING SCALE, Disp: 6 mL, Rfl: 3   insulin detemir (LEVEMIR FLEXTOUCH) 100 UNIT/ML FlexPen, Inject 22 units subcutaneously daily at bedtime, max dose 60 units (Patient taking differently: 24 Units. Inject 22 units subcutaneously daily at bedtime, max dose 60 units), Disp: 15 mL, Rfl: 1   Insulin Pen Needle (PEN NEEDLES 3/16") 31G X 5 MM MISC, Use as directed, once daily., Disp: 90 each, Rfl: 2   Olmesartan-amLODIPine-HCTZ 40-5-25 MG TABS, TAKE ONE TABLET BY MOUTH EVERY DAY, Disp: 90 tablet, Rfl: 2   tamoxifen (NOLVADEX) 20 MG tablet, Take 1 tablet (20 mg total) by mouth daily., Disp: 90 tablet, Rfl: 3   Continuous Blood Gluc Receiver (FREESTYLE LIBRE 14 DAY READER) DEVI, 1 each by Does not apply route QID. (Patient not taking: Reported on 11/30/2021), Disp: 2 each, Rfl: 2   Continuous Blood Gluc Sensor (FREESTYLE LIBRE 14 DAY SENSOR) MISC, Use as directed to check blood sugars 4 times per day dx: e11.65 (Patient not taking: Reported on 11/30/2021), Disp: 2 each, Rfl: 2   Semaglutide,0.25 or 0.5MG/DOS, (OZEMPIC, 0.25 OR 0.5 MG/DOSE,) 2 MG/1.5ML SOPN, Inject 0.5 mg into the skin once a week., Disp:  3 mL, Rfl: 3   Vitamin D, Cholecalciferol, 50 MCG (2000 UT) CAPS, Take 2,000 Units by mouth daily.  (Patient not taking: Reported on 11/30/2021), Disp: , Rfl:    No Known Allergies   Review of Systems  Constitutional: Negative.   Eyes:  Negative for blurred vision.  Respiratory: Negative.  Negative for shortness of breath.   Cardiovascular:  Negative.  Negative for chest pain, palpitations and orthopnea.  Gastrointestinal: Negative.   Psychiatric/Behavioral: Negative.      Today's Vitals   11/30/21 0824  BP: 114/78  Pulse: (!) 59  Temp: 98.5 F (36.9 C)  Weight: 162 lb (73.5 kg)  Height: 5' 5"  (1.651 m)  PainSc: 0-No pain   Body mass index is 26.96 kg/m.  Wt Readings from Last 3 Encounters:  11/30/21 162 lb (73.5 kg)  10/07/21 163 lb 3.2 oz (74 kg)  10/04/21 161 lb 4.8 oz (73.2 kg)    BP Readings from Last 3 Encounters:  11/30/21 114/78  10/07/21 102/84  10/04/21 134/90    Objective:  Physical Exam Vitals and nursing note reviewed.  Constitutional:      Appearance: Normal appearance.  HENT:     Head: Normocephalic and atraumatic.     Nose:     Comments: Masked     Mouth/Throat:     Comments: Masked  Eyes:     Extraocular Movements: Extraocular movements intact.  Cardiovascular:     Rate and Rhythm: Normal rate and regular rhythm.     Heart sounds: Normal heart sounds.  Pulmonary:     Effort: Pulmonary effort is normal.     Breath sounds: Normal breath sounds.  Musculoskeletal:     Cervical back: Normal range of motion.  Skin:    General: Skin is warm.  Neurological:     General: No focal deficit present.     Mental Status: She is alert.  Psychiatric:        Mood and Affect: Mood normal.        Behavior: Behavior normal.        Assessment And Plan:     1. Parenchymal renal hypertension, stage 1 through stage 4 or unspecified chronic kidney disease Comments: Chronic, well controlled. She is encouraged to follow low sodium diet. I will check renal function today and forward results to Dr. Johnney Wang.  - BMP8+EGFR  2. Type 2 diabetes mellitus with stage 3b chronic kidney disease, with long-term current use of insulin ([facility]) Comments: Chronic, I will check labs as listed below. I will resend Ozempic and do PA if needed. Will also send [place] 2 sensors. She will f/u in 3 mos for re-evaluation.  -  BMP8+EGFR - Hemoglobin A1c  3. Aortic atherosclerosis ([place]) Comments: Chronic, importance of following heart healthy diet was stressed to the patient.  She is reminded to take statin as prescribed.   4. Malignant neoplasm of upper-outer quadrant of left breast in female, estrogen receptor positive ([place]) Comments: She is s/p lumpectomy in 9/20. Adjuvant radiation therapy performed 09/17/2019-10/07/2019. She is now taking tamoxifen, will take for total of 5 years.   5. Overweight (BMI 25.0-29.9)  She is encouraged to strive for BMI less than 26 to decrease cardiac risk. Advised to aim for at least 150 minutes of exercise per week.   Patient was given opportunity to ask questions. Patient verbalized understanding of the plan and was able to repeat key elements of the plan. All questions were answered to their satisfaction.   I, Theda Belfast  Baird Cancer, MD, have reviewed all documentation for this visit. The documentation on 11/30/21 for the exam, diagnosis, procedures, and orders are all accurate and complete.   IF YOU HAVE BEEN REFERRED TO A SPECIALIST, IT MAY TAKE 1-2 WEEKS TO SCHEDULE/PROCESS THE REFERRAL. IF YOU HAVE NOT HEARD FROM US/SPECIALIST IN TWO WEEKS, PLEASE GIVE [place] A CALL AT [phone].   THE PATIENT IS ENCOURAGED TO PRACTICE SOCIAL DISTANCING DUE TO THE COVID-19 PANDEMIC.

## 2021-12-01 LAB — BMP8+EGFR
BUN/Creatinine Ratio: 17 (ref 12–28)
BUN: 29 mg/dL — ABNORMAL HIGH (ref 8–27)
CO2: 26 mmol/L (ref 20–29)
Calcium: 9.8 mg/dL (ref 8.7–10.3)
Chloride: 101 mmol/L (ref 96–106)
Creatinine, Ser: 1.69 mg/dL — ABNORMAL HIGH (ref 0.57–1.00)
Glucose: 117 mg/dL — ABNORMAL HIGH (ref 70–99)
Potassium: 4.2 mmol/L (ref 3.5–5.2)
Sodium: 141 mmol/L (ref 134–144)
eGFR: 32 mL/min/1.73 — ABNORMAL LOW

## 2021-12-01 LAB — HEMOGLOBIN A1C
Est. average glucose Bld gHb Est-mCnc: 194 mg/dL
Hgb A1c MFr Bld: 8.4 % — ABNORMAL HIGH (ref 4.8–5.6)

## 2021-12-03 ENCOUNTER — Telehealth: Payer: Self-pay

## 2021-12-03 NOTE — Chronic Care Management (AMB) (Signed)
Chronic Care Management Pharmacy Assistant   Name: Michele Wang  MRN: [id number] DOB: Apr 04, 1951   Reason for Encounter: Disease State/ Diabetes  Recent office visits:  11-30-2021 [name], MD. Glucose= 117, BUN= 29, Creatinine= 1.69, eGFR= 32. A1C= 8.4  Recent consult visits:  None  Hospital visits:  Medication Reconciliation was completed by comparing discharge summary, patients EMR and Pharmacy list, and upon discussion with patient.  Admitted to the hospital on 08-28-2021 due to Hyperglycemia. Discharge date was 08-29-2021. Discharged from [facility]?Medications Started at [facility] Discharge:?? Kelfex 500 mg take twice daily for 7 days.  Medication Changes at Hospital Discharge: None  Medications Discontinued at Hospital Discharge: None  Medications that remain the same after Hospital Discharge:??  -All other medications will remain the same.    Medications: Outpatient Encounter Medications as of 12/03/2021  Medication Sig   atorvastatin (LIPITOR) 40 MG tablet Take 1 tablet by mouth Monday Wed Friday   Continuous Blood Gluc Receiver (FREESTYLE LIBRE 14 DAY READER) DEVI 1 each by Does not apply route QID.   Continuous Blood Gluc Sensor (FREESTYLE LIBRE 14 DAY SENSOR) MISC Use as directed to check blood sugars 4 times per day dx: e11.65   insulin aspart (FIASP FLEXTOUCH) 100 UNIT/ML FlexTouch Pen INJECT INTO SKIN SUBCUTANEOUS BEFORE MEALS PER SLIDING SCALE   insulin detemir (LEVEMIR FLEXTOUCH) 100 UNIT/ML FlexPen Inject 22 units subcutaneously daily at bedtime, max dose 60 units (Patient taking differently: 24 Units. Inject 22 units subcutaneously daily at bedtime, max dose 60 units)   Insulin Pen Needle (PEN NEEDLES 3/16") 31G X 5 MM MISC Use as directed, once daily.   Olmesartan-amLODIPine-HCTZ 40-5-25 MG TABS TAKE ONE TABLET BY MOUTH EVERY DAY   Semaglutide,0.25 or 0.5MG/DOS, (OZEMPIC, 0.25 OR 0.5 MG/DOSE,) 2 MG/1.5ML SOPN Inject 0.5 mg into the  skin once a week.   tamoxifen (NOLVADEX) 20 MG tablet Take 1 tablet (20 mg total) by mouth daily.   Vitamin D, Cholecalciferol, 50 MCG (2000 UT) CAPS Take 2,000 Units by mouth daily.  (Patient not taking: Reported on 11/30/2021)   No facility-administered encounter medications on file as of 12/03/2021.  Recent Relevant Labs: Lab Results  Component Value Date/Time   HGBA1C 8.4 (H) 11/30/2021 08:50 AM   HGBA1C 9.8 (H) 10/07/2021 04:16 PM   MICROALBUR 30 07/19/2021 04:17 PM   MICROALBUR 30 02/17/2021 02:00 PM    Kidney Function Lab Results  Component Value Date/Time   CREATININE 1.69 (H) 11/30/2021 08:50 AM   CREATININE 1.75 (H) 10/07/2021 04:16 PM   GFRNONAA 26 (L) 08/29/2021 12:39 AM   GFRAA 28 (L) 09/08/2020 12:13 PM    Current antihyperglycemic regimen:  Fiasp Flextouch - inject into the skin before meals per sliding scale. Levemir 100 unit/ml - injecting 22 units daily at bedtime, max dose at 60 units.  What recent interventions/DTPs have been made to improve glycemic control:  Educated on A1c and blood sugar goals; Complications of diabetes including kidney damage, retinal damage, and cardiovascular disease; Prevention and management of hypoglycemic episodes; Benefits of routine self-monitoring of blood sugar; -Patient was unaware that she could reach out to the pharmacy team [facility]  -Patient reports that she has not received her CGM supplies for the next 3 months and she is going to contact [place] to check feet daily and get yearly eye exams -Counseled on the importance of reaching out if she has issues with getting her  medication.   Have there been any recent hospitalizations or ED visits since last visit with CPP? No  Patient denies hypoglycemic symptoms Patient denies hyperglycemic symptoms How often are you checking your blood sugar? twice daily  What are your blood sugars ranging?  Fasting: 112 Before meals:  None After meals: 120 Bedtime: None  During the week, how often does your blood glucose drop below 70? Never  Are you checking your feet daily/regularly? Patient states daily  Adherence Review: Is the patient currently on a STATIN medication? Yes Is the patient currently on ACE/ARB medication? Yes Does the patient have >5 day gap between last estimated fill dates? No  NOTES: Patient stated Dr. Baird Cancer is aware that she was unable to get Ozempic and is working on starting a different medication.  Care Gaps: Yearly foot exam overdue AWV 02-23-2022  Star Rating Drugs: Atorvastatin 40 mg- [facility] (Contacted [facility] [place] and was told last filled on 11-19-2021 84 DS) Olmesartan/amlodipine/HCTZ 40-5-25 mg- Last filled 11-15-2021 90 DS [place] Clinical Pharmacist Assistant [phone]

## 2021-12-08 ENCOUNTER — Telehealth: Payer: Self-pay

## 2021-12-08 ENCOUNTER — Telehealth: Payer: Medicare PPO

## 2021-12-08 NOTE — Telephone Encounter (Signed)
°  Care Management   Follow Up Note   12/08/2021 Name: Michele Wang MRN: [id number] DOB: 10-13-1951   Referred by: [name], MD Reason for referral : Chronic Care Management (RN CM Follow up call )  I spoke with Mrs. Waites briefly today, unfortunately she is unable to speak with me at this time.  An unsuccessful telephone outreach was attempted today. The patient was referred to the case management team for assistance with care management and care coordination.   Follow Up Plan: Telephone follow up appointment with care management team member scheduled for: 01/03/22  Barb Merino, RN, BSN, CCM Care Management Coordinator [place] [facility]  Direct Phone: [phone]

## 2021-12-15 ENCOUNTER — Other Ambulatory Visit: Payer: Self-pay

## 2021-12-15 DIAGNOSIS — Z794 Long term (current) use of insulin: Secondary | ICD-10-CM

## 2021-12-15 DIAGNOSIS — E1122 Type 2 diabetes mellitus with diabetic chronic kidney disease: Secondary | ICD-10-CM

## 2021-12-20 ENCOUNTER — Telehealth: Payer: Self-pay

## 2021-12-20 NOTE — Telephone Encounter (Signed)
The patient was notified sample of Ozempic is available for pickup.

## 2021-12-27 ENCOUNTER — Other Ambulatory Visit: Payer: Self-pay | Admitting: Internal Medicine

## 2022-01-03 ENCOUNTER — Telehealth: Payer: Medicare PPO

## 2022-01-03 ENCOUNTER — Ambulatory Visit (INDEPENDENT_AMBULATORY_CARE_PROVIDER_SITE_OTHER): Payer: Medicare PPO

## 2022-01-03 DIAGNOSIS — N1832 Chronic kidney disease, stage 3b: Secondary | ICD-10-CM

## 2022-01-03 DIAGNOSIS — I10 Essential (primary) hypertension: Secondary | ICD-10-CM

## 2022-01-03 DIAGNOSIS — E782 Mixed hyperlipidemia: Secondary | ICD-10-CM

## 2022-01-03 DIAGNOSIS — E1122 Type 2 diabetes mellitus with diabetic chronic kidney disease: Secondary | ICD-10-CM

## 2022-01-03 NOTE — Chronic Care Management (AMB) (Signed)
Chronic Care Management   CCM RN Visit Note  01/03/2022 Name: Michele Wang MRN: [id number] DOB: 1951/06/27  Subjective: Michele Wang is a 71 y.o. year old female who is a primary care patient of [name], MD. The care management team was consulted for assistance with disease management and care coordination needs.    Engaged with patient by telephone for follow up visit in response to provider referral for case management and/or care coordination services.   Consent to Services:  The patient was given information about Chronic Care Management services, agreed to services, and gave verbal consent prior to initiation of services.  Please see initial visit note for detailed documentation.   Patient agreed to services and verbal consent obtained.   Assessment: Review of patient past medical history, allergies, medications, health status, including review of consultants reports, laboratory and other test data, was performed as part of comprehensive evaluation and provision of chronic care management services.   SDOH (Social Determinants of Health) assessments and interventions performed:  Yes, no acute challenges   CCM Care Plan  No Known Allergies  Outpatient Encounter Medications as of 01/03/2022  Medication Sig   atorvastatin (LIPITOR) 40 MG tablet TAKE ONE TABLET BY MOUTH ON MONDAY, WEDNESDAY, AND FRIDAY   Continuous Blood Gluc Receiver (FREESTYLE LIBRE 14 DAY READER) DEVI 1 each by Does not apply route QID.   Continuous Blood Gluc Sensor (FREESTYLE LIBRE 14 DAY SENSOR) MISC Use as directed to check blood sugars 4 times per day dx: e11.65   insulin aspart (FIASP FLEXTOUCH) 100 UNIT/ML FlexTouch Pen INJECT INTO SKIN SUBCUTANEOUS BEFORE MEALS PER SLIDING SCALE   insulin detemir (LEVEMIR FLEXTOUCH) 100 UNIT/ML FlexPen Inject 22 units subcutaneously daily at bedtime, max dose 60 units (Patient taking differently: 24 Units. Inject 22 units subcutaneously daily at bedtime, max dose 60  units)   Insulin Pen Needle (PEN NEEDLES 3/16") 31G X 5 MM MISC Use as directed, once daily.   Olmesartan-amLODIPine-HCTZ 40-5-25 MG TABS TAKE ONE TABLET BY MOUTH EVERY DAY   Semaglutide,0.25 or 0.5MG/DOS, (OZEMPIC, 0.25 OR 0.5 MG/DOSE,) 2 MG/1.5ML SOPN Inject 0.5 mg into the skin once a week.   tamoxifen (NOLVADEX) 20 MG tablet Take 1 tablet (20 mg total) by mouth daily.   Vitamin D, Cholecalciferol, 50 MCG (2000 UT) CAPS Take 2,000 Units by mouth daily.  (Patient not taking: Reported on 11/30/2021)   No facility-administered encounter medications on file as of 01/03/2022.    Patient Active Problem List   Diagnosis Date Noted   Recurrent UTI 10/11/2021   Acute cystitis without hematuria 07/25/2021   Palpitations 07/25/2021   Posterior vitreous detachment of right eye 02/16/2021   Aortic atherosclerosis ([place]) 11/28/2020   Overweight (BMI 25.0-29.9) 11/28/2020   Moderate nonproliferative diabetic retinopathy of both eyes without macular edema associated with type 2 diabetes mellitus ([place]) 05/19/2020   Vitreomacular adhesion of left eye 05/19/2020   Nuclear sclerotic cataract of both eyes 05/19/2020   Memory deficit 02/24/2020   Stroke (cerebrum) ([place]) 02/24/2020   AKI (acute kidney injury) ([place])    Thrush    Hyperglycemia 01/10/2020   Hypertension 01/10/2020   Acute lower UTI 01/10/2020   Malignant neoplasm of upper-outer quadrant of left breast in female, estrogen receptor positive ([place]) 12/16/2019   Ductal carcinoma in situ (DCIS) of left breast 07/17/2019   Type 2 diabetes mellitus with stage 3b chronic kidney disease, without long-term current use of insulin ([place]) 11/08/2018   Chronic renal disease, stage III ([place]) 11/08/2018  Parenchymal renal hypertension 11/08/2018   Pure hypercholesterolemia 11/08/2018   Spider veins of both lower extremities 08/15/2017    Conditions to be addressed/monitored: HTN, HLD, DM II, CKD   Care Plan : RN Care Manager Plan of Care  Updates made by  [name], RN since 01/03/2022 12:00 AM     Problem: No establishment of plan of care for chronic disease states (HTN, HLD, DM II, CKD)   Priority: High     Long-Range Goal: Establishment of plan of care for management of chronic disease states (HTN, HLD, DM II, CKD)   Start Date: 01/03/2022  Expected End Date: 01/03/2023  This Visit's Progress: On track  Priority: High  Note:   Current Barriers:  Knowledge Deficits related to plan of care for management of HTN, HLD, DM II, CKD   Chronic Disease Management support and education needs related to HTN, HLD, DM II, CKD    RNCM Clinical Goal(s):  Patient will verbalize basic understanding of  HTN, HLD, DM II, CKD  disease process and self health management plan as evidenced by patient will report having no disease exacerbations related to her chronic disease states as listed above take all medications exactly as prescribed and will call provider for medication related questions as evidenced by demonstrate improved understanding of prescribed medications and rationale for usage as evidenced by patient teach back demonstrate Improved health management independence as evidenced by patient will report 100% adherence to her prescribed treatment plan  continue to work with RN Care Manager to address care management and care coordination needs related to  HTN, HLD, DM II, CKD  as evidenced by adherence to CM Team Scheduled appointments demonstrate ongoing self health care management ability   as evidenced by    through collaboration with RN Care manager, provider, and care team.   Interventions: 1:1 collaboration with primary care provider regarding development and update of comprehensive plan of care as evidenced by provider attestation and co-signature Inter-disciplinary care team collaboration (see longitudinal plan of care) Evaluation of current treatment plan related to  self management and patient's adherence to plan as established by  provider   Chronic Kidney Disease Interventions:  (Status:  Goal on track:  Yes.) Long Term Goal Assessed the Patient understanding of chronic kidney disease    Evaluation of current treatment plan related to chronic kidney disease self management and patient's adherence to plan as established by provider      Reviewed prescribed diet increase daily water intake to 48-64 oz daily unless otherwise directed  Advised patient, providing education and rationale, to monitor blood pressure daily and record, calling PCP for findings outside established parameters    Discussed the impact of chronic kidney disease on daily life and mental health and acknowledged and normalized feelings of disempowerment, fear, and frustration    Provided education on kidney disease progression    Engage patient in early, proactive and ongoing discussion about goals of care and what matters most to them    Mailed printed educational materials related to Management of Chronic Kidney disease Discussed plans with patient for ongoing care management follow up and provided patient with direct contact information for care management team Last practice recorded BP readings:  BP Readings from Last 3 Encounters:  11/30/21 114/78  10/07/21 102/84  10/04/21 134/90  Most recent eGFR/CrCl:  Lab Results  Component Value Date   EGFR 32 (L) 11/30/2021    No components found for: CRCL  Diabetes Interventions:  (Status:  Goal  on track:  Yes.) Long Term Goal Assessed patient's understanding of A1c goal: <7% Provided education to patient about basic DM disease process Reviewed medications with patient and discussed importance of medication adherence Counseled on importance of regular laboratory monitoring as prescribed Advised patient, providing education and rationale, to check cbg daily before meals and at bedtime  and record, calling PCP for findings outside established parameters Review of patient status, including review of  consultants reports, relevant laboratory and other test results, and medications completed Educated patient on dietary and exercise recommendations; daily glycemic control FBS 80-130, <180 after meals;15'15' rule Mailed printed educational materials related to How to Perform Chair Exercises  Discussed plans with patient for ongoing care management follow up and provided patient with direct contact information for care management team Lab Results  Component Value Date   HGBA1C 8.4 (H) 11/30/2021   Hypertension Interventions:  (Status:  Goal on track:  Yes.) Long Term Goal Last practice recorded BP readings:  BP Readings from Last 3 Encounters:  11/30/21 114/78  10/07/21 102/84  10/04/21 134/90  Most recent eGFR/CrCl:  Lab Results  Component Value Date   EGFR 32 (L) 11/30/2021    No components found for: CRCL Evaluation of current treatment plan related to hypertension self management and patient's adherence to plan as established by provider Counseled on the importance of exercise goals with target of 150 minutes per week Advised patient, providing education and rationale, to monitor blood pressure daily and record, calling PCP for findings outside established parameters Provided education on prescribed diet low Sodium Discussed complications of poorly controlled blood pressure such as heart disease, stroke, circulatory complications, vision complications, kidney impairment, sexual dysfunction Discussed plans with patient for ongoing care management follow up and provided patient with direct contact information for care management team  Patient Goals/Self-Care Activities: Take all medications as prescribed Attend all scheduled provider appointments Call pharmacy for medication refills 3-7 days in advance of running out of medications Perform all self care activities independently  Perform IADL's (shopping, preparing meals, housekeeping, managing finances) independently Call provider  office for new concerns or questions  drink 6 to 8 glasses of water each day manage portion size check blood pressure 3 times per week keep a blood pressure log take blood pressure log to all doctor appointments call doctor for signs and symptoms of high blood pressure take medications for blood pressure exactly as prescribed report new symptoms to your doctor  Follow Up Plan:  Telephone follow up appointment with care management team member scheduled for:  03/02/22        Plan:Telephone follow up appointment with care management team member scheduled for:  03/02/22  Barb Merino, RN, BSN, CCM Care Management Coordinator [place] [facility]  Direct Phone: [phone]

## 2022-01-03 NOTE — Patient Instructions (Signed)
Visit Information  Thank you for taking time to visit with me today. Please don't hesitate to contact me if I can be of assistance to you before our next scheduled telephone appointment.  Following are the goals we discussed today:  (Copy and paste patient goals from clinical care plan here)  Our next appointment is by telephone on 03/02/22 at 11:30 AM   Please call the care guide team at [phone] if you need to cancel or reschedule your appointment.   If you are experiencing a Mental Health or [place] or need someone to talk to, please call [phone]-TALK (toll free, 24 hour hotline)   Patient verbalizes understanding of instructions and care plan provided today and agrees to view in [place]. Active MyChart status confirmed with patient.    Barb Merino, RN, BSN, CCM Care Management Coordinator [place] [facility]  Direct Phone: [phone]

## 2022-01-07 ENCOUNTER — Telehealth: Payer: Self-pay

## 2022-01-07 NOTE — Chronic Care Management (AMB) (Signed)
Chronic Care Management Pharmacy Assistant   Name: Michele Wang  MRN: [id number] DOB: [birth year]  Reason for Encounter: Disease State/ Diabetes  Recent office visits:  01-03-2022 Michele Logan, RN (CCM)  Recent consult visits:  None  Hospital visits:  None in previous 6 months  Medications: Outpatient Encounter Medications as of 01/07/2022  Medication Sig   atorvastatin (LIPITOR) 40 MG tablet TAKE ONE TABLET BY MOUTH ON MONDAY, WEDNESDAY, AND FRIDAY   Continuous Blood Gluc Receiver (FREESTYLE LIBRE 14 DAY READER) DEVI 1 each by Does not apply route QID.   Continuous Blood Gluc Sensor (FREESTYLE LIBRE 14 DAY SENSOR) MISC Use as directed to check blood sugars 4 times per day dx: e11.65   insulin aspart (FIASP FLEXTOUCH) 100 UNIT/ML FlexTouch Pen INJECT INTO SKIN SUBCUTANEOUS BEFORE MEALS PER SLIDING SCALE   insulin detemir (LEVEMIR FLEXTOUCH) 100 UNIT/ML FlexPen Inject 22 units subcutaneously daily at bedtime, max dose 60 units (Patient taking differently: 24 Units. Inject 22 units subcutaneously daily at bedtime, max dose 60 units)   Insulin Pen Needle (PEN NEEDLES 3/16") 31G X 5 MM MISC Use as directed, once daily.   Olmesartan-amLODIPine-HCTZ 40-5-25 MG TABS TAKE ONE TABLET BY MOUTH EVERY DAY   Semaglutide,0.25 or 0.5MG /DOS, (OZEMPIC, 0.25 OR 0.5 MG/DOSE,) 2 MG/1.5ML SOPN Inject 0.5 mg into the skin once a week.   tamoxifen (NOLVADEX) 20 MG tablet Take 1 tablet (20 mg total) by mouth daily.   Vitamin D, Cholecalciferol, 50 MCG (2000 UT) CAPS Take 2,000 Units by mouth daily.  (Patient not taking: Reported on 11/30/2021)   No facility-administered encounter medications on file as of 01/07/2022.  Recent Relevant Labs: Lab Results  Component Value Date/Time   HGBA1C 8.4 (H) 11/30/2021 08:50 AM   HGBA1C 9.8 (H) 10/07/2021 04:16 PM   MICROALBUR 30 07/19/2021 04:17 PM   MICROALBUR 30 02/17/2021 02:00 PM    Kidney Function Lab Results  Component Value Date/Time   CREATININE 1.69  (H) 11/30/2021 08:50 AM   CREATININE 1.75 (H) 10/07/2021 04:16 PM   GFRNONAA 26 (L) 08/29/2021 12:39 AM   GFRAA 28 (L) 09/08/2020 12:13 PM    Current antihyperglycemic regimen:  Fiasp Flextouch - inject into the skin before meals per sliding scale. Levemir 100 unit/ml - injecting 24 units daily at bedtime, max dose at 60 units. Ozempic 0.5 mg weekly (Samples)  What recent interventions/DTPs have been made to improve glycemic control:  Educated on A1c and blood sugar goals; Complications of diabetes including kidney damage, retinal damage, and cardiovascular disease; Prevention and management of hypoglycemic episodes; Benefits of routine self-monitoring of blood sugar; -Patient was unaware that she could reach out to the pharmacy team [facility]  -Patient reports that she has not received her CGM supplies for the next 3 months and she is going to contact [place] to check feet daily and get yearly eye exams -Counseled on the importance of reaching out if she has issues with getting her medication  Have there been any recent hospitalizations or ED visits since last visit with CPP? No  Patient denies hypoglycemic symptoms  Patient denies hyperglycemic symptoms  How often are you checking your blood sugar? twice daily  What are your blood sugars ranging?  Fasting: 100, 90 Before meals: None After meals: 105, 100 Bedtime: None  During the week, how often does your blood glucose drop below 70? Never  Are you checking your feet daily/regularly? Daily  Adherence Review: Is  the patient currently on a STATIN medication? Yes Is the patient currently on ACE/ARB medication? Yes Does the patient have >5 day gap between last estimated fill dates? No  NOTES: Patient stated she received a sample and has 2 weeks left. Dr. Baird Cancer will determine if she needs to continue in a few weeks.  Care Gaps: Yearly foot exam overdue AWV  02-23-2022  Star Rating Drugs: Atorvastatin 40 mg- [facility] (Contacted [facility] [place] and was told last filled on 11-19-2021 84 DS) Olmesartan/amlodipine/HCTZ 40-5-25 mg- Last filled 11-15-2021 90 DS [place] Clinical Pharmacist Assistant [phone]

## 2022-01-18 ENCOUNTER — Ambulatory Visit: Payer: Medicare PPO | Admitting: Internal Medicine

## 2022-01-25 DIAGNOSIS — I10 Essential (primary) hypertension: Secondary | ICD-10-CM

## 2022-01-25 DIAGNOSIS — N1832 Chronic kidney disease, stage 3b: Secondary | ICD-10-CM | POA: Diagnosis not present

## 2022-01-25 DIAGNOSIS — E782 Mixed hyperlipidemia: Secondary | ICD-10-CM | POA: Diagnosis not present

## 2022-01-25 DIAGNOSIS — E1122 Type 2 diabetes mellitus with diabetic chronic kidney disease: Secondary | ICD-10-CM | POA: Diagnosis not present

## 2022-01-31 ENCOUNTER — Telehealth: Payer: Self-pay

## 2022-01-31 NOTE — Chronic Care Management (AMB) (Signed)
? ? ?  Chronic Care Management ?Pharmacy Assistant  ? ?Name: Michele Wang  MRN: [id number] DOB: 1951-01-07 ? ?Reason for Encounter: Disease State/ General ? ?Recent office visits:  ?None ? ?Recent consult visits:  ?None ? ?Hospital visits:  ?None in previous 6 months ? ?Medications: ?Outpatient Encounter Medications as of 01/31/2022  ?Medication Sig  ? atorvastatin (LIPITOR) 40 MG tablet TAKE ONE TABLET BY MOUTH ON MONDAY, WEDNESDAY, AND FRIDAY  ? Continuous Blood Gluc Receiver (FREESTYLE LIBRE 14 DAY READER) DEVI 1 each by Does not apply route QID.  ? Continuous Blood Gluc Sensor (FREESTYLE LIBRE 14 DAY SENSOR) MISC Use as directed to check blood sugars 4 times per day dx: e11.65  ? insulin aspart (FIASP FLEXTOUCH) 100 UNIT/ML FlexTouch Pen INJECT INTO SKIN SUBCUTANEOUS BEFORE MEALS PER SLIDING SCALE  ? insulin detemir (LEVEMIR FLEXTOUCH) 100 UNIT/ML FlexPen Inject 22 units subcutaneously daily at bedtime, max dose 60 units (Patient taking differently: 24 Units. Inject 22 units subcutaneously daily at bedtime, max dose 60 units)  ? Insulin Pen Needle (PEN NEEDLES 3/16") 31G X 5 MM MISC Use as directed, once daily.  ? Olmesartan-amLODIPine-HCTZ 40-5-25 MG TABS TAKE ONE TABLET BY MOUTH EVERY DAY  ? Semaglutide,0.25 or 0.'5MG'$ /DOS, (OZEMPIC, 0.25 OR 0.5 MG/DOSE,) 2 MG/1.5ML SOPN Inject 0.5 mg into the skin once a week.  ? tamoxifen (NOLVADEX) 20 MG tablet Take 1 tablet (20 mg total) by mouth daily.  ? Vitamin D, Cholecalciferol, 50 MCG (2000 UT) CAPS Take 2,000 Units by mouth daily.  (Patient not taking: Reported on 11/30/2021)  ? ?No facility-administered encounter medications on file as of 01/31/2022.  ? ? ?01-31-2022: 1st attempt left VM ?02-03-2022: 2nd attempt left VM ?02-07-2022: 3rd attempt left VM ? ?Care Gaps: ?Yearly foot exam overdue ?AWV 02-23-2022 ? ?Star Rating Drugs: ?Atorvastatin 40 mg- [facility] ?Olmesartan/Amlodipine/HCTZ 40-5-25 mg- [facility] ?Ozempic 0.5 mg- Last filled  11-30-2021 90 DS [facility] ? ?Malecca Hicks CMA ?Clinical Pharmacist Assistant ?[phone] ? ?

## 2022-02-08 ENCOUNTER — Telehealth: Payer: Medicare PPO

## 2022-02-23 ENCOUNTER — Other Ambulatory Visit: Payer: Self-pay

## 2022-02-23 ENCOUNTER — Ambulatory Visit (INDEPENDENT_AMBULATORY_CARE_PROVIDER_SITE_OTHER): Payer: Medicare PPO | Admitting: Internal Medicine

## 2022-02-23 ENCOUNTER — Ambulatory Visit (INDEPENDENT_AMBULATORY_CARE_PROVIDER_SITE_OTHER): Payer: Medicare PPO

## 2022-02-23 ENCOUNTER — Encounter: Payer: Self-pay | Admitting: Internal Medicine

## 2022-02-23 VITALS — BP 110/70 | HR 69 | Temp 98.1°F | Ht 65.2 in | Wt 160.5 lb

## 2022-02-23 VITALS — BP 110/70 | HR 69 | Temp 98.1°F | Ht 65.2 in | Wt 160.4 lb

## 2022-02-23 DIAGNOSIS — E1122 Type 2 diabetes mellitus with diabetic chronic kidney disease: Secondary | ICD-10-CM | POA: Diagnosis not present

## 2022-02-23 DIAGNOSIS — Z0000 Encounter for general adult medical examination without abnormal findings: Secondary | ICD-10-CM | POA: Diagnosis not present

## 2022-02-23 DIAGNOSIS — I129 Hypertensive chronic kidney disease with stage 1 through stage 4 chronic kidney disease, or unspecified chronic kidney disease: Secondary | ICD-10-CM

## 2022-02-23 DIAGNOSIS — N1832 Chronic kidney disease, stage 3b: Secondary | ICD-10-CM

## 2022-02-23 DIAGNOSIS — Z6826 Body mass index (BMI) 26.0-26.9, adult: Secondary | ICD-10-CM | POA: Diagnosis not present

## 2022-02-23 DIAGNOSIS — H8110 Benign paroxysmal vertigo, unspecified ear: Secondary | ICD-10-CM | POA: Diagnosis not present

## 2022-02-23 DIAGNOSIS — R0982 Postnasal drip: Secondary | ICD-10-CM | POA: Diagnosis not present

## 2022-02-23 NOTE — Patient Instructions (Signed)

## 2022-02-23 NOTE — Patient Instructions (Signed)
Michele Wang , ?Thank you for taking time to come for your Medicare Wellness Visit. I appreciate your ongoing commitment to your health goals. Please review the following plan we discussed and let me know if I can assist you in the future.  ? ?Screening recommendations/referrals: ?Colonoscopy: completed 08/09/2019, due 08/08/2029 ?Mammogram: completed 06/24/2021, due 06/25/2022 ?Bone Density: completed 02/04/2020 ?Recommended yearly ophthalmology/optometry visit for glaucoma screening and checkup ?Recommended yearly dental visit for hygiene and checkup ? ?Vaccinations: ?Influenza vaccine: completed 09/01/2021, due next flu season ?Pneumococcal vaccine: completed 02/05/2019 ?Tdap vaccine: completed 10/14/2014, due 10/14/2024 ?Shingles vaccine: discussed   ?Covid-19: 10/15/2021, 11/23/2020, 02/19/2020, 01/24/2020 ? ?Advanced directives: Please bring a copy of your POA (Power of Attorney) and/or Living Will to your next appointment.  ? ?Conditions/risks identified: 6 CIT was 10. PCP updated. ? ?Next appointment: Follow up in one year for your annual wellness visit  ? ? ?Preventive Care 71 Years and Older, Female ?Preventive care refers to lifestyle choices and visits with your health care provider that can promote health and wellness. ?What does preventive care include? ?A yearly physical exam. This is also called an annual well check. ?Dental exams once or twice a year. ?Routine eye exams. Ask your health care provider how often you should have your eyes checked. ?Personal lifestyle choices, including: ?Daily care of your teeth and gums. ?Regular physical activity. ?Eating a healthy diet. ?Avoiding tobacco and drug use. ?Limiting alcohol use. ?Practicing safe sex. ?Taking low-dose aspirin every day. ?Taking vitamin and mineral supplements as recommended by your health care provider. ?What happens during an annual well check? ?The services and screenings done by your health care provider during your annual well check will depend on  your age, overall health, lifestyle risk factors, and family history of disease. ?Counseling  ?Your health care provider may ask you questions about your: ?Alcohol use. ?Tobacco use. ?Drug use. ?Emotional well-being. ?Home and relationship well-being. ?Sexual activity. ?Eating habits. ?History of falls. ?Memory and ability to understand (cognition). ?Work and work Statistician. ?Reproductive health. ?Screening  ?You may have the following tests or measurements: ?Height, weight, and BMI. ?Blood pressure. ?Lipid and cholesterol levels. These may be checked every 5 years, or more frequently if you are over 66 years old. ?Skin check. ?Lung cancer screening. You may have this screening every year starting at age 71 if you have a 30-pack-year history of smoking and currently smoke or have quit within the past 15 years. ?Fecal occult blood test (FOBT) of the stool. You may have this test every year starting at age 71. ?Flexible sigmoidoscopy or colonoscopy. You may have a sigmoidoscopy every 5 years or a colonoscopy every 10 years starting at age 21. ?Hepatitis C blood test. ?Hepatitis B blood test. ?Sexually transmitted disease (STD) testing. ?Diabetes screening. This is done by checking your blood sugar (glucose) after you have not eaten for a while (fasting). You may have this done every 1-3 years. ?Bone density scan. This is done to screen for osteoporosis. You may have this done starting at age 71. ?Mammogram. This may be done every 1-2 years. Talk to your health care provider about how often you should have regular mammograms. ?Talk with your health care provider about your test results, treatment options, and if necessary, the need for more tests. ?Vaccines  ?Your health care provider may recommend certain vaccines, such as: ?Influenza vaccine. This is recommended every year. ?Tetanus, diphtheria, and acellular pertussis (Tdap, Td) vaccine. You may need a Td booster every 10 years. ?Zoster vaccine.  You may need this  after age 44. ?Pneumococcal 13-valent conjugate (PCV13) vaccine. One dose is recommended after age 77. ?Pneumococcal polysaccharide (PPSV23) vaccine. One dose is recommended after age 56. ?Talk to your health care provider about which screenings and vaccines you need and how often you need them. ?This information is not intended to replace advice given to you by your health care provider. Make sure you discuss any questions you have with your health care provider. ?Document Released: 12/11/2015 Document Revised: 08/03/2016 Document Reviewed: 09/15/2015 ?Elsevier Interactive Patient Education ? 2017 [employer]. ? ?Fall Prevention in the Home ?Falls can cause injuries. They can happen to people of all ages. There are many things you can do to make your home safe and to help prevent falls. ?What can I do on the outside of my home? ?Regularly fix the edges of walkways and driveways and fix any cracks. ?Remove anything that might make you trip as you walk through a door, such as a raised step or threshold. ?Trim any bushes or trees on the path to your home. ?Use bright outdoor lighting. ?Clear any walking paths of anything that might make someone trip, such as rocks or tools. ?Regularly check to see if handrails are loose or broken. Make sure that both sides of any steps have handrails. ?Any raised decks and porches should have guardrails on the edges. ?Have any leaves, snow, or ice cleared regularly. ?Use sand or salt on walking paths during winter. ?Clean up any spills in your garage right away. This includes oil or grease spills. ?What can I do in the bathroom? ?Use night lights. ?Install grab bars by the toilet and in the tub and shower. Do not use towel bars as grab bars. ?Use non-skid mats or decals in the tub or shower. ?If you need to sit down in the shower, use a plastic, non-slip stool. ?Keep the floor dry. Clean up any water that spills on the floor as soon as it happens. ?Remove soap buildup in the tub or  shower regularly. ?Attach bath mats securely with double-sided non-slip rug tape. ?Do not have throw rugs and other things on the floor that can make you trip. ?What can I do in the bedroom? ?Use night lights. ?Make sure that you have a light by your bed that is easy to reach. ?Do not use any sheets or blankets that are too big for your bed. They should not hang down onto the floor. ?Have a firm chair that has side arms. You can use this for support while you get dressed. ?Do not have throw rugs and other things on the floor that can make you trip. ?What can I do in the kitchen? ?Clean up any spills right away. ?Avoid walking on wet floors. ?Keep items that you use a lot in easy-to-reach places. ?If you need to reach something above you, use a strong step stool that has a grab bar. ?Keep electrical cords out of the way. ?Do not use floor polish or wax that makes floors slippery. If you must use wax, use non-skid floor wax. ?Do not have throw rugs and other things on the floor that can make you trip. ?What can I do with my stairs? ?Do not leave any items on the stairs. ?Make sure that there are handrails on both sides of the stairs and use them. Fix handrails that are broken or loose. Make sure that handrails are as long as the stairways. ?Check any carpeting to make sure that it is  firmly attached to the stairs. Fix any carpet that is loose or worn. ?Avoid having throw rugs at the top or bottom of the stairs. If you do have throw rugs, attach them to the floor with carpet tape. ?Make sure that you have a light switch at the top of the stairs and the bottom of the stairs. If you do not have them, ask someone to add them for you. ?What else can I do to help prevent falls? ?Wear shoes that: ?Do not have high heels. ?Have rubber bottoms. ?Are comfortable and fit you well. ?Are closed at the toe. Do not wear sandals. ?If you use a stepladder: ?Make sure that it is fully opened. Do not climb a closed stepladder. ?Make  sure that both sides of the stepladder are locked into place. ?Ask someone to hold it for you, if possible. ?Clearly mark and make sure that you can see: ?Any grab bars or handrails. ?First and last step

## 2022-02-23 NOTE — Progress Notes (Signed)
?Michele Wang,acting as a Education administrator for Michele Greenland, MD.,have documented all relevant documentation on the behalf of Michele Greenland, MD,as directed by  Michele Greenland, MD while in the presence of Michele Greenland, MD.  ?This visit occurred during the SARS-CoV-2 public health emergency.  Safety protocols were in place, including screening questions prior to the visit, additional usage of staff PPE, and extensive cleaning of exam room while observing appropriate contact time as indicated for disinfecting solutions. ? ?Subjective:  ?  ? Patient ID: Michele Wang , female    DOB: [birth year] , 71 y.o.   MRN: [id number] ? ? ?Chief Complaint  ?Patient presents with  ? Hypertension  ? Diabetes  ? ? ?HPI ? ?Pt presents today for BP/Diabetes check. She reports compliance with meds.  However, admits that she stopped the Ozempic two weeks ago. She was using the 0.59m dose. She states it did decrease her appetite, and that it made her feel "dry". Admits she probably was not drinking enough liquids.  ? ?She also had AWV performed today, performed by Michele Wang.  ? ?Hypertension ?This is a chronic problem. The current episode started more than 1 year ago. The problem has been gradually improving since onset. The problem is controlled. Pertinent negatives include no blurred vision, chest pain, orthopnea, palpitations or shortness of breath. Risk factors for coronary artery disease include diabetes mellitus, dyslipidemia, post-menopausal state and sedentary lifestyle. The current treatment provides moderate improvement. Compliance problems include exercise.  Hypertensive end-organ damage includes kidney disease.  ?Diabetes ?She presents for her follow-up diabetic visit. She has type 2 diabetes mellitus. Hypoglycemia symptoms include dizziness. There are no diabetic associated symptoms. Pertinent negatives for diabetes include no blurred vision, no chest pain, no polydipsia, no polyphagia and no polyuria. There are no  hypoglycemic complications. Diabetic complications include nephropathy. She is following a diabetic diet. She participates in exercise intermittently.   ? ?Past Medical History:  ?Diagnosis Date  ? [place]   ? CKD (chronic kidney disease)   ? Diabetes mellitus without complication ([place]   ? Type II  ? Hyperlipidemia   ? Hypertension   ? UTI (urinary tract infection)   ?  ? ?Family History  ?Problem Relation Age of Onset  ? Aneurysm Mother   ? Healthy Father   ? ? ? ?Current Outpatient Medications:  ?  atorvastatin (LIPITOR) 40 MG tablet, TAKE ONE TABLET BY MOUTH ON MONDAY, WEDNESDAY, AND FRIDAY, Disp: 36 tablet, Rfl: 3 ?  Continuous Blood Gluc Receiver (FREESTYLE LIBRE 14 DAY READER) DEVI, 1 each by Does not apply route QID., Disp: 2 each, Rfl: 2 ?  Continuous Blood Gluc Sensor (FREESTYLE LIBRE 14 DAY SENSOR) MISC, Use as directed to check blood sugars 4 times per day dx: e11.65, Disp: 2 each, Rfl: 11 ?  insulin aspart (FIASP FLEXTOUCH) 100 UNIT/ML FlexTouch Pen, INJECT INTO SKIN SUBCUTANEOUS BEFORE MEALS PER SLIDING SCALE, Disp: 6 mL, Rfl: 3 ?  insulin detemir (LEVEMIR FLEXTOUCH) 100 UNIT/ML FlexPen, Inject 22 units subcutaneously daily at bedtime, max dose 60 units (Patient taking differently: 24 Units. Inject 22 units subcutaneously daily at bedtime, max dose 60 units), Disp: 15 mL, Rfl: 1 ?  Insulin Pen Needle (PEN NEEDLES 3/16") 31G X 5 MM MISC, Use as directed, once daily., Disp: 90 each, Rfl: 2 ?  Olmesartan-amLODIPine-HCTZ 40-5-25 MG TABS, TAKE ONE TABLET BY MOUTH EVERY DAY, Disp: 90 tablet, Rfl: 2 ?  Semaglutide,0.25 or 0.5MG/DOS, (OZEMPIC, 0.25 OR 0.5 MG/DOSE,)  2 MG/1.5ML SOPN, Inject 0.5 mg into the skin once a week., Disp: 3 mL, Rfl: 3 ?  tamoxifen (NOLVADEX) 20 MG tablet, Take 1 tablet (20 mg total) by mouth daily., Disp: 90 tablet, Rfl: 3 ?  Vitamin D, Cholecalciferol, 50 MCG (2000 UT) CAPS, Take 2,000 Units by mouth daily.  (Patient not taking: Reported on 11/30/2021), Disp: , Rfl:   ? ?No  Known Allergies  ? ?Review of Systems  ?Constitutional: Negative.   ?HENT:  Positive for postnasal drip.   ?Eyes:  Negative for blurred vision.  ?Respiratory: Negative.  Negative for shortness of breath.   ?Cardiovascular: Negative.  Negative for chest pain, palpitations and orthopnea.  ?Endocrine: Negative for polydipsia, polyphagia and polyuria.  ?Neurological:  Positive for dizziness.  ?     She c/o dizziness today during her AWV with Michele Wang.   ?Psychiatric/Behavioral: Negative.     ? ?Today's Vitals  ? 02/23/22 0941  ?BP: 110/70  ?Pulse: 69  ?Temp: 98.1 ?F (36.7 ?C)  ?Weight: 160 lb 7.9 oz (72.8 kg)  ?Height: 5' 5.2" (1.656 m)  ?PainSc: 0-No pain  ? ?Body mass index is 26.54 kg/m?.  ?Wt Readings from Last 3 Encounters:  ?02/23/22 160 lb 7.9 oz (72.8 kg)  ?02/23/22 160 lb 6.4 oz (72.8 kg)  ?11/30/21 162 lb (73.5 kg)  ?  ? ?Objective:  ?Physical Exam ?Vitals and nursing note reviewed.  ?Constitutional:   ?   Appearance: Normal appearance.  ?HENT:  ?   Head: Normocephalic and atraumatic.  ?   Right Ear: Tympanic membrane, ear canal and external ear normal. There is no impacted cerumen.  ?   Left Ear: Tympanic membrane, ear canal and external ear normal. There is no impacted cerumen.  ?   Nose:  ?   Comments: Masked  ?   Mouth/Throat:  ?   Comments: Masked  ?Eyes:  ?   Extraocular Movements: Extraocular movements intact.  ?Cardiovascular:  ?   Rate and Rhythm: Normal rate and regular rhythm.  ?   Heart sounds: Normal heart sounds.  ?Pulmonary:  ?   Effort: Pulmonary effort is normal.  ?   Breath sounds: Normal breath sounds.  ?Musculoskeletal:  ?   Cervical back: Normal range of motion.  ?Skin: ?   General: Skin is warm.  ?Neurological:  ?   General: No focal deficit present.  ?   Mental Status: She is alert.  ?Psychiatric:     ?   Mood and Affect: Mood normal.     ?   Behavior: Behavior normal.  ?   ?Assessment And Plan:  ?   ?1. Parenchymal renal hypertension, stage 1 through stage 4 or unspecified chronic  kidney disease ?Comments: Chronic, well controlled. She is encouraged to follow a low sodium diet. NO med changes today. She agrees to rto next week for lab visit.  ?- CBC no Diff; Future ? ?2. Type 2 diabetes mellitus with stage 3b chronic kidney disease, without long-term current use of insulin ([place]) ?Comments: Chronic, I will check CMP, a1c next week at lab visit. Importance of dietary compliance was d/w patient. I plan to consider resuming Ozempic qod. ?- CMP14+EGFR; Future ?- Lipid panel; Future ?- Hemoglobin A1c; Future ?- CBC no Diff; Future ? ?3. Benign paroxysmal positional vertigo, unspecified laterality ?Comments: She will try Zyrtec,65m 1/2 tab nightly. If persistent, I will add meclizine prn.  ? ?4. Postnasal drip ?Comments: She has taken cetirizine in the past, but it made her groggy  when taken during the day. Advised to take 1/2 tab po qpm.  ? ?5. BMI 26.0-26.9,adult ?Comments: She is encouraged to aim for at least 150 minutes of exercise per week.  ?  ? ? ?Patient was given opportunity to ask questions. Patient verbalized understanding of the plan and was able to repeat key elements of the plan. All questions were answered to their satisfaction.  ? ?I, Michele Greenland, MD, have reviewed all documentation for this visit. The documentation on 02/23/22 for the exam, diagnosis, procedures, and orders are all accurate and complete.  ? ?IF YOU HAVE BEEN REFERRED TO A SPECIALIST, IT MAY TAKE 1-2 WEEKS TO SCHEDULE/PROCESS THE REFERRAL. IF YOU HAVE NOT HEARD FROM US/SPECIALIST IN TWO WEEKS, PLEASE GIVE [place] A CALL AT [phone] X [phone].  ? ?THE PATIENT IS ENCOURAGED TO PRACTICE SOCIAL DISTANCING DUE TO THE COVID-19 PANDEMIC.   ?

## 2022-02-23 NOTE — Progress Notes (Signed)
?This visit occurred during the SARS-CoV-2 public health emergency.  Safety protocols were in place, including screening questions prior to the visit, additional usage of staff PPE, and extensive cleaning of exam room while observing appropriate contact time as indicated for disinfecting solutions. ? ?Subjective:  ? Michele Wang is a 71 y.o. female who presents for Medicare Annual (Subsequent) preventive examination. ? ?Review of Systems    ? ?Cardiac Risk Factors include: advanced age (>40mn, >>58women);diabetes mellitus;hypertension ? ?   ?Objective:  ?  ?Today's Vitals  ? 02/23/22 0902  ?BP: 110/70  ?Pulse: 69  ?Temp: 98.1 ?F (36.7 ?C)  ?TempSrc: Oral  ?SpO2: 93%  ?Weight: 160 lb 6.4 oz (72.8 kg)  ?Height: 5' 5.2" (1.656 m)  ? ?Body mass index is 26.53 kg/m?. ? ? ?  02/23/2022  ?  9:14 AM 08/29/2021  ? 12:35 AM 02/17/2021  ? 10:07 AM 10/12/2020  ?  3:01 PM 08/30/2020  ?  4:08 PM 02/14/2020  ? 10:35 AM 02/13/2020  ?  9:05 AM  ?Advanced Directives  ?Does Patient Have a Medical Advance Directive? Yes Yes Yes Yes No No No  ?Type of [place]amed[place]of [place] will Living will;Healthcare Power of [place]      ?Copy of [place]? No - copy requested  No - copy requested      ?Would patient like information on creating a medical advance directive?      No - Patient declined No - Patient declined  ? ? ?Current Medications (verified) ?Outpatient Encounter Medications as of 02/23/2022  ?Medication Sig  ? atorvastatin (LIPITOR) 40 MG tablet TAKE ONE TABLET BY MOUTH ON MONDAY, WEDNESDAY, AND FRIDAY  ? Continuous Blood Gluc Receiver (FREESTYLE LIBRE 14 DAY READER) DEVI 1 each by Does not apply route QID.  ? Continuous Blood Gluc Sensor (FREESTYLE LIBRE 14 DAY SENSOR) MISC Use as directed to check blood sugars 4 times per day dx: e11.65  ? insulin aspart (FIASP FLEXTOUCH) 100 UNIT/ML FlexTouch Pen INJECT INTO SKIN SUBCUTANEOUS BEFORE  MEALS PER SLIDING SCALE  ? insulin detemir (LEVEMIR FLEXTOUCH) 100 UNIT/ML FlexPen Inject 22 units subcutaneously daily at bedtime, max dose 60 units (Patient taking differently: 24 Units. Inject 22 units subcutaneously daily at bedtime, max dose 60 units)  ? Insulin Pen Needle (PEN NEEDLES 3/16") 31G X 5 MM MISC Use as directed, once daily.  ? Olmesartan-amLODIPine-HCTZ 40-5-25 MG TABS TAKE ONE TABLET BY MOUTH EVERY DAY  ? Semaglutide,0.25 or 0.'5MG'$ /DOS, (OZEMPIC, 0.25 OR 0.5 MG/DOSE,) 2 MG/1.5ML SOPN Inject 0.5 mg into the skin once a week.  ? tamoxifen (NOLVADEX) 20 MG tablet Take 1 tablet (20 mg total) by mouth daily.  ? Vitamin D, Cholecalciferol, 50 MCG (2000 UT) CAPS Take 2,000 Units by mouth daily.  (Patient not taking: Reported on 11/30/2021)  ? ?No facility-administered encounter medications on file as of 02/23/2022.  ? ? ?Allergies (verified) ?Patient has no known allergies.  ? ?History: ?Past Medical History:  ?Diagnosis Date  ? Breast cancer ([place]   ? CKD (chronic kidney disease)   ? Diabetes mellitus without complication ([place]   ? Type II  ? Hyperlipidemia   ? Hypertension   ? UTI (urinary tract infection)   ? ?Past Surgical History:  ?Procedure Laterality Date  ? BREAST LUMPECTOMY WITH RADIOACTIVE SEED LOCALIZATION Left 08/21/2019  ? Procedure: LEFT BREAST LUMPECTOMY WITH RADIOACTIVE SEED LOCALIZATION;  Surgeon: TJovita Kussmaul MD;  Location: [facility];  Service: General;  Laterality: Left;  ? CESAREAN SECTION    ? x2  ? ?Family History  ?Problem Relation Age of Onset  ? Aneurysm Mother   ? Healthy Father   ? ?Social History  ? ?Socioeconomic History  ? Marital status: Married  ?  Spouse name: Not on file  ? Number of children: Not on file  ? Years of education: Not on file  ? Highest education level: Not on file  ?Occupational History  ? Occupation: retired  ?Tobacco Use  ? Smoking status: Never  ? Smokeless tobacco: Never  ?Vaping Use  ? Vaping Use: Never used  ?Substance and Sexual  Activity  ? Alcohol use: No  ? Drug use: No  ? Sexual activity: Yes  ?Other Topics Concern  ? Not on file  ?Social History Narrative  ? Not on file  ? ?Social Determinants of Health  ? ?Financial Resource Strain: Low Risk   ? Difficulty of Paying Living Expenses: Not hard at all  ?Food Insecurity: No Food Insecurity  ? Worried About Charity fundraiser in the Last Year: Never true  ? Ran Out of Food in the Last Year: Never true  ?Transportation Needs: No Transportation Needs  ? Lack of Transportation (Medical): No  ? Lack of Transportation (Non-Medical): No  ?Physical Activity: Inactive  ? Days of Exercise per Week: 0 days  ? Minutes of Exercise per Session: 0 min  ?Stress: No Stress Concern Present  ? Feeling of Stress : Not at all  ?Social Connections: Not on file  ? ? ?Tobacco Counseling ?Counseling given: Not Answered ? ? ?Clinical Intake: ? ?Pre-visit preparation completed: Yes ? ?Pain : No/denies pain ? ?  ? ?Nutritional Status: BMI 25 -29 Overweight ?Nutritional Risks: Nausea/ vomitting/ diarrhea (nausea with movement sometimes) ?Diabetes: Yes ? ?How often do you need to have someone help you when you read instructions, pamphlets, or other written materials from your doctor or pharmacy?: 1 - Never ?What is the last grade level you completed in school?: 12th grade ? ?Diabetic? Yes ?Nutrition Risk Assessment: ? ?Has the patient had any N/V/D within the last 2 months?  Yes  ?Does the patient have any non-healing wounds?  No  ?Has the patient had any unintentional weight loss or weight gain?  No  ? ?Diabetes: ? ?Is the patient diabetic?  Yes  ?If diabetic, was a CBG obtained today?  No  ?Did the patient bring in their glucometer from home?  No  ?How often do you monitor your CBG's? Twice daily.  ? ?Financial Strains and Diabetes Management: ? ?Are you having any financial strains with the device, your supplies or your medication? No .  ?Does the patient want to be seen by Chronic Care Management for management of  their diabetes?  No  ?Would the patient like to be referred to a Nutritionist or for Diabetic Management?  No  ? ?Diabetic Exams: ? ?Diabetic Eye Exam: Overdue for diabetic eye exam. Pt has been advised about the importance in completing this exam. Patient advised to call and schedule an eye exam. ?Diabetic Foot Exam: Overdue, Pt has been advised about the importance in completing this exam. Pt is scheduled for diabetic foot exam on next appointment. ? ? ?Interpreter Needed?: No ? ?Information entered by :: NAllen LPN ? ? ?Activities of Daily Living ? ?  02/23/2022  ?  9:15 AM  ?In your present state of health, do you have any difficulty performing the  following activities:  ?Hearing? 0  ?Vision? 0  ?Difficulty concentrating or making decisions? 0  ?Walking or climbing stairs? 0  ?Dressing or bathing? 0  ?Doing errands, shopping? 0  ?Preparing Food and eating ? N  ?Using the Toilet? N  ?In the past six months, have you accidently leaked urine? N  ?Do you have problems with loss of bowel control? N  ?Managing your Medications? N  ?Managing your Finances? N  ?Housekeeping or managing your Housekeeping? N  ? ? ?Patient Care Team: ?[name], MD as PCP - General (Internal Medicine) ?Kyung Rudd, MD as Consulting Physician (Radiation Oncology) ?Jovita Kussmaul, MD as Consulting Physician (General Surgery) ?Nicholas Lose, MD as Consulting Physician (Hematology and Oncology) ?Rankin, Clent Demark, MD as Consulting Physician (Ophthalmology) ?Little, Claudette Stapler, RN as [place] Management ?Mayford Knife, RPH (Pharmacist) ? ?Indicate any recent Medical Services you may have received from other than [facility] providers in the past year (date may be approximate). ? ?   ?Assessment:  ? This is a routine wellness examination for Michele Wang. ? ?Hearing/Vision screen ?Vision Screening - Comments:: Regular eye exams, Dr. Zadie Rhine, Dr. Katy Fitch ? ?Dietary issues and exercise activities discussed: ?Current Exercise Habits: The  patient does not participate in regular exercise at present ? ? Goals Addressed   ? ?  ?  ?  ?  ? This Visit's Progress  ?  Patient Stated     ?  02/23/2022, wants to start exercising ?  ? ?  ? ?Depression Screen

## 2022-02-28 ENCOUNTER — Ambulatory Visit: Payer: Medicare PPO | Admitting: Internal Medicine

## 2022-02-28 ENCOUNTER — Other Ambulatory Visit: Payer: Medicare PPO

## 2022-02-28 DIAGNOSIS — I129 Hypertensive chronic kidney disease with stage 1 through stage 4 chronic kidney disease, or unspecified chronic kidney disease: Secondary | ICD-10-CM

## 2022-02-28 DIAGNOSIS — E1122 Type 2 diabetes mellitus with diabetic chronic kidney disease: Secondary | ICD-10-CM

## 2022-03-01 LAB — HEMOGLOBIN A1C
Est. average glucose Bld gHb Est-mCnc: 163 mg/dL
Hgb A1c MFr Bld: 7.3 % — ABNORMAL HIGH (ref 4.8–5.6)

## 2022-03-01 LAB — CMP14+EGFR
ALT: 19 IU/L (ref 0–32)
AST: 21 IU/L (ref 0–40)
Albumin/Globulin Ratio: 1.4 (ref 1.2–2.2)
Albumin: 4.3 g/dL (ref 3.8–4.8)
Alkaline Phosphatase: 62 IU/L (ref 44–121)
BUN/Creatinine Ratio: 17 (ref 12–28)
BUN: 28 mg/dL — ABNORMAL HIGH (ref 8–27)
Bilirubin Total: 0.2 mg/dL (ref 0.0–1.2)
CO2: 24 mmol/L (ref 20–29)
Calcium: 10 mg/dL (ref 8.7–10.3)
Chloride: 102 mmol/L (ref 96–106)
Creatinine, Ser: 1.63 mg/dL — ABNORMAL HIGH (ref 0.57–1.00)
Globulin, Total: 3 g/dL (ref 1.5–4.5)
Glucose: 125 mg/dL — ABNORMAL HIGH (ref 70–99)
Potassium: 4.4 mmol/L (ref 3.5–5.2)
Sodium: 142 mmol/L (ref 134–144)
Total Protein: 7.3 g/dL (ref 6.0–8.5)
eGFR: 34 mL/min/{1.73_m2} — ABNORMAL LOW (ref >59)

## 2022-03-01 LAB — LIPID PANEL
Chol/HDL Ratio: 2.2 ratio (ref 0.0–4.4)
Cholesterol, Total: 133 mg/dL (ref 100–199)
HDL: 60 mg/dL (ref >39)
LDL Chol Calc (NIH): 51 mg/dL (ref 0–99)
Triglycerides: 126 mg/dL (ref 0–149)
VLDL Cholesterol Cal: 22 mg/dL (ref 5–40)

## 2022-03-01 LAB — CBC
Hematocrit: 35 % (ref 34.0–46.6)
Hemoglobin: 11.6 g/dL (ref 11.1–15.9)
MCH: 29.1 pg (ref 26.6–33.0)
MCHC: 33.1 g/dL (ref 31.5–35.7)
MCV: 88 fL (ref 79–97)
Platelets: 242 10*3/uL (ref 150–450)
RBC: 3.98 x10E6/uL (ref 3.77–5.28)
RDW: 14.1 % (ref 11.7–15.4)
WBC: 5 10*3/uL (ref 3.4–10.8)

## 2022-03-02 ENCOUNTER — Telehealth: Payer: Medicare PPO

## 2022-03-02 ENCOUNTER — Encounter: Payer: Self-pay | Admitting: Internal Medicine

## 2022-03-02 ENCOUNTER — Ambulatory Visit (INDEPENDENT_AMBULATORY_CARE_PROVIDER_SITE_OTHER): Payer: Medicare PPO

## 2022-03-02 DIAGNOSIS — E782 Mixed hyperlipidemia: Secondary | ICD-10-CM

## 2022-03-02 DIAGNOSIS — N1832 Chronic kidney disease, stage 3b: Secondary | ICD-10-CM

## 2022-03-02 DIAGNOSIS — I10 Essential (primary) hypertension: Secondary | ICD-10-CM

## 2022-03-02 NOTE — Patient Instructions (Signed)
Visit Information ? ?Thank you for taking time to visit with me today. Please don't hesitate to contact me if I can be of assistance to you before our next scheduled telephone appointment. ? ?Following are the goals we discussed today:  ?(Copy and paste patient goals from clinical care plan here) ? ?Our next appointment is by telephone on 07/29/22 at 10:30 AM ? ?Please call the care guide team at [phone] if you need to cancel or reschedule your appointment.  ? ?If you are experiencing a Mental Health or [place] or need someone to talk to, please call 1-800-273-TALK (toll free, 24 hour hotline)  ? ?Patient verbalizes understanding of instructions and care plan provided today and agrees to view in [place]. Active MyChart status confirmed with patient.   ? ?Barb Merino, RN, BSN, CCM ?Care Management Coordinator ?[place] [facility]  ?Direct Phone: [phone] ? ? ?

## 2022-03-02 NOTE — Chronic Care Management (AMB) (Signed)
?Chronic Care Management  ? ?CCM RN Visit Note ? ?03/02/2022 ?Name: Michele Wang MRN: [id number] DOB: 12-03-1950 ? ?Subjective: ?Michele Wang is a 71 y.o. year old female who is a primary care patient of [name], MD. The care management team was consulted for assistance with disease management and care coordination needs.   ? ?Engaged with patient by telephone for follow up visit in response to provider referral for case management and/or care coordination services.  ? ?Consent to Services:  ?The patient was given information about Chronic Care Management services, agreed to services, and gave verbal consent prior to initiation of services.  Please see initial visit note for detailed documentation.  ? ?Patient agreed to services and verbal consent obtained.  ? ?Assessment: Review of patient past medical history, allergies, medications, health status, including review of consultants reports, laboratory and other test data, was performed as part of comprehensive evaluation and provision of chronic care management services.  ? ?SDOH (Social Determinants of Health) assessments and interventions performed: Yes, no acute challenges   ? ?CCM Care Plan ? ?No Known Allergies ? ?Outpatient Encounter Medications as of 03/02/2022  ?Medication Sig  ? atorvastatin (LIPITOR) 40 MG tablet TAKE ONE TABLET BY MOUTH ON MONDAY, WEDNESDAY, AND FRIDAY  ? Continuous Blood Gluc Receiver (FREESTYLE LIBRE 14 DAY READER) DEVI 1 each by Does not apply route QID.  ? Continuous Blood Gluc Sensor (FREESTYLE LIBRE 14 DAY SENSOR) MISC Use as directed to check blood sugars 4 times per day dx: e11.65  ? insulin aspart (FIASP FLEXTOUCH) 100 UNIT/ML FlexTouch Pen INJECT INTO SKIN SUBCUTANEOUS BEFORE MEALS PER SLIDING SCALE  ? insulin detemir (LEVEMIR FLEXTOUCH) 100 UNIT/ML FlexPen Inject 22 units subcutaneously daily at bedtime, max dose 60 units (Patient taking differently: 24 Units. Inject 22 units subcutaneously daily at bedtime, max dose 60  units)  ? Insulin Pen Needle (PEN NEEDLES 3/16") 31G X 5 MM MISC Use as directed, once daily.  ? Olmesartan-amLODIPine-HCTZ 40-5-25 MG TABS TAKE ONE TABLET BY MOUTH EVERY DAY  ? Semaglutide,0.25 or 0.5MG/DOS, (OZEMPIC, 0.25 OR 0.5 MG/DOSE,) 2 MG/1.5ML SOPN Inject 0.5 mg into the skin once a week.  ? tamoxifen (NOLVADEX) 20 MG tablet Take 1 tablet (20 mg total) by mouth daily.  ? Vitamin D, Cholecalciferol, 50 MCG (2000 UT) CAPS Take 2,000 Units by mouth daily.  (Patient not taking: Reported on 11/30/2021)  ? ?No facility-administered encounter medications on file as of 03/02/2022.  ? ? ?Patient Active Problem List  ? Diagnosis Date Noted  ? Recurrent UTI 10/11/2021  ? Acute cystitis without hematuria 07/25/2021  ? Palpitations 07/25/2021  ? Posterior vitreous detachment of right eye 02/16/2021  ? Aortic atherosclerosis ([place]) 11/28/2020  ? Overweight (BMI 25.0-29.9) 11/28/2020  ? Moderate nonproliferative diabetic retinopathy of both eyes without macular edema associated with type 2 diabetes mellitus ([place]) 05/19/2020  ? Vitreomacular adhesion of left eye 05/19/2020  ? Nuclear sclerotic cataract of both eyes 05/19/2020  ? Memory deficit 02/24/2020  ? Stroke (cerebrum) ([place]) 02/24/2020  ? AKI (acute kidney injury) ([place])   ? Thrush   ? Hyperglycemia 01/10/2020  ? Hypertension 01/10/2020  ? Acute lower UTI 01/10/2020  ? Malignant neoplasm of upper-outer quadrant of left breast in female, estrogen receptor positive ([place]) 12/16/2019  ? Ductal carcinoma in situ (DCIS) of left breast 07/17/2019  ? Type 2 diabetes mellitus with stage 3b chronic kidney disease, without long-term current use of insulin ([place]) 11/08/2018  ? Chronic renal disease, stage III ([place]) 11/08/2018  ?  Parenchymal renal hypertension 11/08/2018  ? Pure hypercholesterolemia 11/08/2018  ? Spider veins of both lower extremities 08/15/2017  ? ? ?Conditions to be addressed/monitored: HTN, HLD, DM II, CKD  ? ?Care Plan : RN Care Manager Plan of Care  ?Updates made by  [name], RN since 03/02/2022 12:00 AM  ?  ? ?Problem: No establishment of plan of care for chronic disease states (HTN, HLD, DM II, CKD)   ?Priority: High  ?  ? ?Long-Range Goal: Establishment of plan of care for management of chronic disease states (HTN, HLD, DM II, CKD)   ?Start Date: 01/03/2022  ?Expected End Date: 01/03/2023  ?Recent Progress: On track  ?Priority: High  ?Note:   ?Current Barriers:  ?Knowledge Deficits related to plan of care for management of HTN, HLD, DM II, CKD   ?Chronic Disease Management support and education needs related to HTN, HLD, DM II, CKD   ? ?RNCM Clinical Goal(s):  ?Patient will verbalize basic understanding of  HTN, HLD, DM II, CKD  disease process and self health management plan as evidenced by patient will report having no disease exacerbations related to her chronic disease states as listed above ?take all medications exactly as prescribed and will call provider for medication related questions as evidenced by demonstrate improved understanding of prescribed medications and rationale for usage as evidenced by patient teach back ?demonstrate Improved health management independence as evidenced by patient will report 100% adherence to her prescribed treatment plan  ?continue to work with RN Care Manager to address care management and care coordination needs related to  HTN, HLD, DM II, CKD  as evidenced by adherence to CM Team Scheduled appointments ?demonstrate ongoing self health care management ability   as evidenced by    through collaboration with RN Care manager, provider, and care team.  ? ?Interventions: ?1:1 collaboration with primary care provider regarding development and update of comprehensive plan of care as evidenced by provider attestation and co-signature ?Inter-disciplinary care team collaboration (see longitudinal plan of care) ?Evaluation of current treatment plan related to  self management and patient's adherence to plan as established by provider ? ?  Chronic Kidney Disease Interventions:  (Status:  Goal on track:  Yes.) Long Term Goal ?Assessed the Patient understanding of chronic kidney disease    ?Evaluation of current treatment plan related to chronic kidney disease self management and patient's adherence to plan as established by provider      ?Reviewed prescribed diet continue to drink 48-64 oz of water daily unless otherwise directed  ?Provided education on kidney disease progression    ?Last practice recorded BP readings:  ?BP Readings from Last 3 Encounters:  ?02/23/22 110/70  ?02/23/22 110/70  ?11/30/21 114/78  ?Most recent eGFR/CrCl:  ?Lab Results  ?Component Value Date  ? EGFR 34 (L) 02/28/2022  ?  No components found for: CRCL ? ?Diabetes Interventions:  (Status:  Goal on track:  Yes.) Long Term Goal ?Assessed patient's understanding of A1c goal: <7% ?Provided education to patient about basic DM disease process ?Reviewed medications with patient and discussed importance of medication adherence, Educated patient on the indication, dosage and mechanism of usage of Ozempic ?Provided patient with written educational materials related to hypo and hyperglycemia and importance of correct treatment ?Advised patient, providing education and rationale, to check cbg daily before breakfast and at bedtime and record, calling PCP for findings outside established parameters ?Referral made to pharmacy team for assistance with pharmacy question regarding needle replacement for Ozempic pen ?Review of  patient status, including review of consultants reports, relevant laboratory and other test results, and medications completed ?Mailed printed educational materials related to Diabetes Management ?Lab Results  ?Component Value Date  ? HGBA1C 7.3 (H) 02/28/2022  ?  ?Hypertension Interventions:  (Status:  Goal on track:  Yes.) Long Term Goal ?Last practice recorded BP readings:  ?BP Readings from Last 3 Encounters:  ?02/23/22 110/70  ?02/23/22 110/70  ?11/30/21 114/78   ?Most recent eGFR/CrCl:  ?Lab Results  ?Component Value Date  ? EGFR 34 (L) 02/28/2022  ?  No components found for: CRCL ?Evaluation of current treatment plan related to hypertension self management and patient'

## 2022-03-27 DIAGNOSIS — I10 Essential (primary) hypertension: Secondary | ICD-10-CM

## 2022-03-27 DIAGNOSIS — E1122 Type 2 diabetes mellitus with diabetic chronic kidney disease: Secondary | ICD-10-CM | POA: Diagnosis not present

## 2022-03-27 DIAGNOSIS — E782 Mixed hyperlipidemia: Secondary | ICD-10-CM | POA: Diagnosis not present

## 2022-03-27 DIAGNOSIS — N1832 Chronic kidney disease, stage 3b: Secondary | ICD-10-CM | POA: Diagnosis not present

## 2022-04-04 ENCOUNTER — Other Ambulatory Visit: Payer: Self-pay | Admitting: Internal Medicine

## 2022-05-03 ENCOUNTER — Telehealth: Payer: Self-pay

## 2022-05-03 NOTE — Chronic Care Management (AMB) (Signed)
Chronic Care Management Pharmacy Assistant   Name: TAWNIE EHRESMAN  MRN: [id number] DOB: [birth year]  Reason for Encounter: Disease State/ General  Recent office visits:  03-02-2022 [name], RN (CCM)  02-23-2022 [name], MD. Glucose= 125, BUN= 28, Creatinine= 1.63, eGFR= 34. A1C= 7.3.  02-23-2022 Kellie Simmering, LPN. Medicare annual wellness.  Recent consult visits:  None  Hospital visits:  None in previous 6 months  Medications: Outpatient Encounter Medications as of 05/03/2022  Medication Sig   atorvastatin (LIPITOR) 40 MG tablet TAKE ONE TABLET BY MOUTH ON MONDAY, WEDNESDAY, AND FRIDAY   Continuous Blood Gluc Receiver (FREESTYLE LIBRE 14 DAY READER) DEVI 1 each by Does not apply route QID.   Continuous Blood Gluc Sensor (FREESTYLE LIBRE 14 DAY SENSOR) MISC Use as directed to check blood sugars 4 times per day dx: e11.65   insulin aspart (FIASP FLEXTOUCH) 100 UNIT/ML FlexTouch Pen INJECT INTO SKIN SUBCUTANEOUS BEFORE MEALS PER SLIDING SCALE   insulin detemir (LEVEMIR FLEXTOUCH) 100 UNIT/ML FlexPen INJECT 22 UNITS SUBCUTANEOUSLY EVERY DAY AT BEDTIME AS DIRECTED -  MAXIMUM DOSE 60 UNITS - DISCARD OPENED PENS 42 DAYS AFTER FIRST USE,  DO NOT REFRIGERATE INDIVIDUAL PENS AFTER FIRST USE.   Insulin Pen Needle (PEN NEEDLES 3/16") 31G X 5 MM MISC Use as directed, once daily.   Olmesartan-amLODIPine-HCTZ 40-5-25 MG TABS TAKE ONE TABLET BY MOUTH EVERY DAY   Semaglutide,0.25 or 0.5MG/DOS, (OZEMPIC, 0.25 OR 0.5 MG/DOSE,) 2 MG/1.5ML SOPN Inject 0.5 mg into the skin once a week.   tamoxifen (NOLVADEX) 20 MG tablet Take 1 tablet (20 mg total) by mouth daily.   Vitamin D, Cholecalciferol, 50 MCG (2000 UT) CAPS Take 2,000 Units by mouth daily.  (Patient not taking: Reported on 11/30/2021)   No facility-administered encounter medications on file as of 05/03/2022.  [place] for General Review Call   Chart Review:  Have there been any documented new, changed, or  discontinued medications since last visit? No  Has there been any documented recent hospitalizations or ED visits since last visit with Clinical Pharmacist? No   Disease State Questions:  Able to connect with Patient? Yes  Did patient have any problems with their health recently? No  Have you had any admissions or emergency room visits or worsening of your condition(s) since last visit? No  Have you had any visits with new specialists or providers since your last visit? No  Have you had any new health care problem(s) since your last visit? No  Have you run out of any of your medications since you last spoke with clinical pharmacist? No  Are there any medications you are not taking as prescribed? No  Are you having any issues or side effects with your medications? No  Do you have any other health concerns or questions you want to discuss with your Clinical Pharmacist before your next visit? No  Are there any health concerns that you feel we can do a better job addressing? No  Are you having any problems with any of the following since the last visit: (select all that apply)  None   12. Any falls since last visit? No  13. Any increased or uncontrolled pain since last visit? No  14. Next visit Type: office       Visit with: Dr. Baird Cancer        Date: 07-27-2022        Time: 8:40  15. Additional Details? No    Care Gaps:  Yearly foot exam overdue Shingrix overdue Yearly ophthalmology overdue  Star Rating Drugs: Atorvastatin 40 mg- Last filled 12-27-2021 [facility] (Patient stated last fill was 05-29 90 DS) Olmesartan/Amlodipine/HCTZ 40-5-25 mg- [facility] (Patient stated last fill was 05-29 90 [facility]) (Ozempic 0.5 mg- Last filled 11-30-2021 [facility] (Patient stated filled 05-29 30 DS)  [place] Clinical Pharmacist Assistant [phone]

## 2022-06-21 DIAGNOSIS — H40033 Anatomical narrow angle, bilateral: Secondary | ICD-10-CM | POA: Diagnosis not present

## 2022-06-21 DIAGNOSIS — H2513 Age-related nuclear cataract, bilateral: Secondary | ICD-10-CM | POA: Diagnosis not present

## 2022-06-21 DIAGNOSIS — E113293 Type 2 diabetes mellitus with mild nonproliferative diabetic retinopathy without macular edema, bilateral: Secondary | ICD-10-CM | POA: Diagnosis not present

## 2022-06-21 DIAGNOSIS — E1137X3 Type 2 diabetes mellitus with diabetic macular edema, resolved following treatment, bilateral: Secondary | ICD-10-CM | POA: Diagnosis not present

## 2022-06-21 LAB — HM DIABETES EYE EXAM

## 2022-06-27 DIAGNOSIS — Z1231 Encounter for screening mammogram for malignant neoplasm of breast: Secondary | ICD-10-CM | POA: Diagnosis not present

## 2022-06-27 LAB — HM MAMMOGRAPHY

## 2022-07-06 NOTE — Progress Notes (Signed)
This encounter was created in error - please disregard.

## 2022-07-14 ENCOUNTER — Ambulatory Visit: Payer: Medicare PPO | Admitting: Hematology and Oncology

## 2022-07-17 NOTE — Progress Notes (Signed)
Patient Care Team: [name], MD as PCP - General (Internal Medicine) Kyung Rudd, MD as Consulting Physician (Radiation Oncology) Jovita Kussmaul, MD as Consulting Physician (General Surgery) Nicholas Lose, MD as Consulting Physician (Hematology and Oncology) Zadie Rhine Clent Demark, MD as Consulting Physician (Ophthalmology) Rex Kras Claudette Stapler, RN as [place], Sharyn Blitz, [facility] (Pharmacist)  DIAGNOSIS: No diagnosis found.  SUMMARY OF ONCOLOGIC HISTORY: Oncology History  Ductal carcinoma in situ (DCIS) of left breast  07/10/2019 Cancer Staging   Staging form: Breast, AJCC 8th Edition - Clinical stage from 07/10/2019: Stage 0 (cTis (DCIS), cN0, cM0, ER+, PR+)    07/17/2019 Initial Diagnosis   Routine screening mammogram detected a 0.6cm area of calcifications in the upper outer left breast. Biopsy showed DCIS with calcifications, intermediate grade, ER+100%, PR+ 100% positive.   08/21/2019 Surgery   Left lumpectomy Marlou Starks) [phone]): low grade DCIS spanning 0.4cm, clear margins, and no invasive carcinoma   08/21/2019 Cancer Staging   Staging form: Breast, AJCC 8th Edition - Pathologic stage from 08/21/2019: Stage 0 (pTis (DCIS), pN0, cM0)    09/16/2019 - 10/11/2019 Radiation Therapy   The patient initially received a dose of 42.56 Gy in 16 fractions to the breast using whole-breast tangent fields. This was delivered using a 3-D conformal technique. The patient then received a boost to the seroma. This delivered an additional 8 Gy in 66factions using a 3 field photon technique due to the depth of the seroma. The total dose was 50.56 Gy.   09/2019 - 09/2024 Anti-estrogen oral therapy   Tamoxifen     CHIEF COMPLIANT: Follow-up of left breast DCIS on tamoxifen    INTERVAL HISTORY: Michele SUBLETTEis a 71y.o. with above-mentioned history of left breast DCIS for which she underwent a lumpectomy, radiation, and is currently on antiestrogen therapy with  tamoxifen. She presents to the clinic today for follow-up.     ALLERGIES:  has No Known Allergies.  MEDICATIONS:  Current Outpatient Medications  Medication Sig Dispense Refill   atorvastatin (LIPITOR) 40 MG tablet TAKE ONE TABLET BY MOUTH ON MONDAY, WEDNESDAY, AND FRIDAY 36 tablet 3   Continuous Blood Gluc Receiver (FREESTYLE LIBRE 14 DAY READER) DEVI 1 each by Does not apply route QID. 2 each 2   Continuous Blood Gluc Sensor (FREESTYLE LIBRE 14 DAY SENSOR) MISC Use as directed to check blood sugars 4 times per day dx: e11.65 2 each 11   insulin aspart (FIASP FLEXTOUCH) 100 UNIT/ML FlexTouch Pen INJECT INTO SKIN SUBCUTANEOUS BEFORE MEALS PER SLIDING SCALE 6 mL 3   insulin detemir (LEVEMIR FLEXTOUCH) 100 UNIT/ML FlexPen INJECT 22 UNITS SUBCUTANEOUSLY EVERY DAY AT BEDTIME AS DIRECTED -  MAXIMUM DOSE 60 UNITS - DISCARD OPENED PENS 42 DAYS AFTER FIRST USE,  DO NOT REFRIGERATE INDIVIDUAL PENS AFTER FIRST USE. 15 mL 1   Insulin Pen Needle (PEN NEEDLES 3/16") 31G X 5 MM MISC Use as directed, once daily. 90 each 2   Olmesartan-amLODIPine-HCTZ 40-5-25 MG TABS TAKE ONE TABLET BY MOUTH EVERY DAY 90 tablet 2   Semaglutide,0.25 or 0.'5MG'$ /DOS, (OZEMPIC, 0.25 OR 0.5 MG/DOSE,) 2 MG/1.5ML SOPN Inject 0.5 mg into the skin once a week. 3 mL 3   tamoxifen (NOLVADEX) 20 MG tablet Take 1 tablet (20 mg total) by mouth daily. 90 tablet 3   Vitamin D, Cholecalciferol, 50 MCG (2000 UT) CAPS Take 2,000 Units by mouth daily.  (Patient not taking: Reported on 11/30/2021)     No current facility-administered medications for  this visit.    PHYSICAL EXAMINATION: ECOG PERFORMANCE STATUS: {CHL ONC ECOG PS:[phone]}  There were no vitals filed for this visit. There were no vitals filed for this visit.  BREAST:*** No palpable masses or nodules in either right or left breasts. No palpable axillary supraclavicular or infraclavicular adenopathy no breast tenderness or nipple discharge. (exam performed in the presence of a  chaperone)  LABORATORY DATA:  I have reviewed the data as listed    Latest Ref Rng & Units 02/28/2022    9:02 AM 11/30/2021    8:50 AM 10/07/2021    4:16 PM  CMP  Glucose 70 - 99 mg/dL 125  117  181   BUN 8 - 27 mg/dL '28  29  29   '$ Creatinine 0.57 - 1.00 mg/dL 1.63  1.69  1.75   Sodium 134 - 144 mmol/L 142  141  141   Potassium 3.5 - 5.2 mmol/L 4.4  4.2  4.7   Chloride 96 - 106 mmol/L 102  101  103   CO2 20 - 29 mmol/L '24  26  27   '$ Calcium 8.7 - 10.3 mg/dL 10.0  9.8  9.6   Total Protein 6.0 - 8.5 g/dL 7.3     Total Bilirubin 0.0 - 1.2 mg/dL 0.2     Alkaline Phos 44 - 121 IU/L 62     AST 0 - 40 IU/L 21     ALT 0 - 32 IU/L 19       Lab Results  Component Value Date   WBC 5.0 02/28/2022   HGB 11.6 02/28/2022   HCT 35.0 02/28/2022   MCV 88 02/28/2022   PLT 242 02/28/2022   NEUTROABS 2.9 08/29/2021    ASSESSMENT & PLAN:  No problem-specific Assessment & Plan notes found for this encounter.    No orders of the defined types were placed in this encounter.  The patient has a good understanding of the overall plan. she agrees with it. she will call with any problems that may develop before the next visit here. Total time spent: 30 mins including face to face time and time spent for planning, charting and co-ordination of care   Suzzette Righter, [place] 07/17/22    I Gardiner Coins am scribing for [name]  ***

## 2022-07-21 ENCOUNTER — Inpatient Hospital Stay: Payer: Medicare PPO | Attending: Hematology and Oncology | Admitting: Hematology and Oncology

## 2022-07-21 ENCOUNTER — Other Ambulatory Visit: Payer: Self-pay

## 2022-07-21 DIAGNOSIS — Z923 Personal history of irradiation: Secondary | ICD-10-CM | POA: Insufficient documentation

## 2022-07-21 DIAGNOSIS — Z79810 Long term (current) use of selective estrogen receptor modulators (SERMs): Secondary | ICD-10-CM | POA: Diagnosis not present

## 2022-07-21 DIAGNOSIS — D0512 Intraductal carcinoma in situ of left breast: Secondary | ICD-10-CM | POA: Diagnosis not present

## 2022-07-21 DIAGNOSIS — Z79899 Other long term (current) drug therapy: Secondary | ICD-10-CM | POA: Insufficient documentation

## 2022-07-21 MED ORDER — TAMOXIFEN CITRATE 20 MG PO TABS: 20.0000 mg | ORAL_TABLET | Freq: Every day | ORAL | 3 refills | Status: DC

## 2022-07-21 NOTE — Assessment & Plan Note (Signed)
08/21/2019: Left lumpectomy Dr. Marlou Starks: Low-grade DCIS 0.4 cm, margins clear, ER 100%, PR 100% Tis NX stage 0  Treatment plan: 1.Adjuvant radiation therapy10/20/2020-10/07/2019 2.Followed by tamoxifen for 5 years -------------------------------------------------------------------------------------------------------------------------------------------- Tamoxifen toxicities:Intermittent hot flashes but bearable  Breast cancer surveillance: 1.Breast exam 07/21/2022: Benign 2.Mammogram 06/27/2022: [facility]: Benign  Bone density 02/27/2020: T score -1.3: Mild osteopenia I renewed her prescription for tamoxifen through [place]  Return to clinic in 1 year for follow-up

## 2022-07-22 ENCOUNTER — Telehealth: Payer: Self-pay | Admitting: Hematology and Oncology

## 2022-07-22 NOTE — Telephone Encounter (Signed)
Scheduled appointment per 8/24 los. Left voicemail for patient.

## 2022-07-26 ENCOUNTER — Encounter: Payer: Self-pay | Admitting: Internal Medicine

## 2022-07-27 ENCOUNTER — Ambulatory Visit (INDEPENDENT_AMBULATORY_CARE_PROVIDER_SITE_OTHER): Payer: Medicare PPO | Admitting: Cardiovascular Disease

## 2022-07-27 ENCOUNTER — Ambulatory Visit (INDEPENDENT_AMBULATORY_CARE_PROVIDER_SITE_OTHER): Payer: Medicare PPO | Admitting: Internal Medicine

## 2022-07-27 ENCOUNTER — Encounter (HOSPITAL_BASED_OUTPATIENT_CLINIC_OR_DEPARTMENT_OTHER): Payer: Self-pay | Admitting: Cardiovascular Disease

## 2022-07-27 ENCOUNTER — Encounter (HOSPITAL_BASED_OUTPATIENT_CLINIC_OR_DEPARTMENT_OTHER): Payer: Self-pay

## 2022-07-27 ENCOUNTER — Encounter: Payer: Self-pay | Admitting: Internal Medicine

## 2022-07-27 VITALS — BP 122/68 | HR 83 | Temp 98.6°F | Ht 65.0 in | Wt 157.8 lb

## 2022-07-27 VITALS — BP 122/82 | HR 77 | Ht 65.0 in | Wt 159.5 lb

## 2022-07-27 DIAGNOSIS — R002 Palpitations: Secondary | ICD-10-CM | POA: Diagnosis not present

## 2022-07-27 DIAGNOSIS — E1122 Type 2 diabetes mellitus with diabetic chronic kidney disease: Secondary | ICD-10-CM | POA: Diagnosis not present

## 2022-07-27 DIAGNOSIS — I639 Cerebral infarction, unspecified: Secondary | ICD-10-CM

## 2022-07-27 DIAGNOSIS — N1832 Chronic kidney disease, stage 3b: Secondary | ICD-10-CM

## 2022-07-27 DIAGNOSIS — I700 Atherosclerosis of aorta: Secondary | ICD-10-CM

## 2022-07-27 DIAGNOSIS — I1310 Hypertensive heart and chronic kidney disease without heart failure, with stage 1 through stage 4 chronic kidney disease, or unspecified chronic kidney disease: Secondary | ICD-10-CM | POA: Diagnosis not present

## 2022-07-27 DIAGNOSIS — E782 Mixed hyperlipidemia: Secondary | ICD-10-CM

## 2022-07-27 DIAGNOSIS — Z23 Encounter for immunization: Secondary | ICD-10-CM

## 2022-07-27 DIAGNOSIS — Z0000 Encounter for general adult medical examination without abnormal findings: Secondary | ICD-10-CM

## 2022-07-27 DIAGNOSIS — I10 Essential (primary) hypertension: Secondary | ICD-10-CM

## 2022-07-27 DIAGNOSIS — E7800 Pure hypercholesterolemia, unspecified: Secondary | ICD-10-CM

## 2022-07-27 DIAGNOSIS — N390 Urinary tract infection, site not specified: Secondary | ICD-10-CM

## 2022-07-27 LAB — POCT URINALYSIS DIPSTICK
Bilirubin, UA: NEGATIVE
Glucose, UA: NEGATIVE
Ketones, UA: NEGATIVE
Nitrite, UA: POSITIVE
Protein, UA: NEGATIVE
Spec Grav, UA: >=1.03 — AB (ref 1.010–1.025)
Urobilinogen, UA: 0.2 E.U./dL
pH, UA: 6 (ref 5.0–8.0)

## 2022-07-27 MED ORDER — CEPHALEXIN 500 MG PO CAPS: 500.0000 mg | ORAL_CAPSULE | Freq: Three times a day (TID) | ORAL | 0 refills | Status: DC

## 2022-07-27 MED ORDER — SHINGRIX 50 MCG/0.5ML IM SUSR: 0.5000 mL | Freq: Once | INTRAMUSCULAR | 0 refills | Status: AC

## 2022-07-27 NOTE — Progress Notes (Signed)
Barnet Glasgow Martin,acting as a Education administrator for [name], MD.,have documented all relevant documentation on the behalf of [name], MD,as directed by  [name], MD while in the presence of [name], MD.   Subjective:     Patient ID: Michele Wang , female    DOB: 03/20/1951 , 71 y.o.   MRN: [id number]   Chief Complaint  Patient presents with   Annual Exam   Diabetes   Hypertension    HPI  Patient presents today for HM, Patient states compliance with medication and has no other issues.  She denies headaches, chest pain and shortness of breath.   Diabetes She presents for her follow-up diabetic visit. She has type 2 diabetes mellitus. Her disease course has been stable. There are no hypoglycemic associated symptoms. Pertinent negatives for diabetes include no blurred vision and no chest pain. There are no hypoglycemic complications. Risk factors for coronary artery disease include diabetes mellitus, dyslipidemia, hypertension, post-menopausal and sedentary lifestyle. She is following a generally healthy diet. She participates in exercise intermittently. Her home blood glucose trend is fluctuating minimally. Her breakfast blood glucose is taken between 8-9 am. Her breakfast blood glucose range is generally 110-130 mg/dl. An ACE inhibitor/angiotensin II receptor blocker is being taken.  Hypertension This is a chronic problem. The current episode started more than 1 year ago. The problem has been gradually improving since onset. Pertinent negatives include no blurred vision, chest pain or shortness of breath.     Past Medical History:  Diagnosis Date   Breast cancer ([place])    CKD (chronic kidney disease)    Diabetes mellitus without complication ([place])    Type II   Hyperlipidemia    Hypertension    UTI (urinary tract infection)      Family History  Problem Relation Age of Onset   Aneurysm Mother    Healthy Father      Current Outpatient Medications:     atorvastatin (LIPITOR) 40 MG tablet, TAKE ONE TABLET BY MOUTH ON MONDAY, WEDNESDAY, AND FRIDAY, Disp: 36 tablet, Rfl: 3   Continuous Blood Gluc Receiver (FREESTYLE LIBRE 14 DAY READER) DEVI, 1 each by Does not apply route QID., Disp: 2 each, Rfl: 2   Continuous Blood Gluc Sensor (FREESTYLE LIBRE 14 DAY SENSOR) MISC, Use as directed to check blood sugars 4 times per day dx: e11.65, Disp: 2 each, Rfl: 11   insulin aspart (FIASP FLEXTOUCH) 100 UNIT/ML FlexTouch Pen, INJECT INTO SKIN SUBCUTANEOUS BEFORE MEALS PER SLIDING SCALE, Disp: 6 mL, Rfl: 3   insulin detemir (LEVEMIR FLEXTOUCH) 100 UNIT/ML FlexPen, INJECT 22 UNITS SUBCUTANEOUSLY EVERY DAY AT BEDTIME AS DIRECTED -  MAXIMUM DOSE 60 UNITS - DISCARD OPENED PENS 42 DAYS AFTER FIRST USE,  DO NOT REFRIGERATE INDIVIDUAL PENS AFTER FIRST USE., Disp: 15 mL, Rfl: 1   Insulin Pen Needle (PEN NEEDLES 3/16") 31G X 5 MM MISC, Use as directed, once daily., Disp: 90 each, Rfl: 2   Olmesartan-amLODIPine-HCTZ 40-5-25 MG TABS, TAKE ONE TABLET BY MOUTH EVERY DAY, Disp: 90 tablet, Rfl: 2   Semaglutide,0.25 or 0.5MG/DOS, (OZEMPIC, 0.25 OR 0.5 MG/DOSE,) 2 MG/1.5ML SOPN, Inject 0.5 mg into the skin once a week., Disp: 3 mL, Rfl: 3   cephALEXin (KEFLEX) 500 MG capsule, Take 1 capsule (500 mg total) by mouth 3 (three) times daily for 10 days., Disp: 21 capsule, Rfl: 0   tamoxifen (NOLVADEX) 20 MG tablet, Take 1 tablet (20 mg total) by mouth daily., Disp: 90 tablet,  Rfl: 3   Vitamin D, Cholecalciferol, 50 MCG (2000 UT) CAPS, Take 2,000 Units by mouth daily., Disp: , Rfl:    Vitamin D, Ergocalciferol, (DRISDOL) 1.25 MG (50000 UNIT) CAPS capsule, , Disp: , Rfl:    No Known Allergies    The patient states she uses post menopausal status for birth control. Last LMP was No LMP recorded. Patient is postmenopausal.. Negative for Dysmenorrhea. Negative for: breast discharge, breast lump(s), breast pain and breast self exam. Associated symptoms include abnormal vaginal bleeding.  Pertinent negatives include abnormal bleeding (hematology), anxiety, decreased libido, depression, difficulty falling sleep, dyspareunia, history of infertility, nocturia, sexual dysfunction, sleep disturbances, urinary incontinence, urinary urgency, vaginal discharge and vaginal itching. Diet regular.The patient states her exercise level is  minimal.  . The patient's tobacco use is:  Social History   Tobacco Use  Smoking Status Never  Smokeless Tobacco Never  . She has been exposed to passive smoke. The patient's alcohol use is:  Social History   Substance and Sexual Activity  Alcohol Use No    Review of Systems  Constitutional: Negative.   HENT: Negative.    Eyes: Negative.  Negative for blurred vision.  Respiratory: Negative.  Negative for shortness of breath.   Cardiovascular: Negative.  Negative for chest pain.  Gastrointestinal: Negative.   Endocrine: Negative.   Genitourinary: Negative.   Musculoskeletal: Negative.   Skin: Negative.   Allergic/Immunologic: Negative.   Neurological: Negative.   Hematological: Negative.   Psychiatric/Behavioral: Negative.       Today's Vitals   07/27/22 0848  BP: 122/68  Pulse: 83  Temp: 98.6 F (37 C)  TempSrc: Oral  Weight: 157 lb 12.8 oz (71.6 kg)  Height: 5' 5"  (1.651 m)  PainSc: 0-No pain   Body mass index is 26.26 kg/m.   Wt Readings from Last 3 Encounters:  07/27/22 159 lb 8 oz (72.3 kg)  07/27/22 157 lb 12.8 oz (71.6 kg)  07/21/22 156 lb 11.2 oz (71.1 kg)     Objective:  Physical Exam Vitals and nursing note reviewed.  Constitutional:      Appearance: Normal appearance.  HENT:     Head: Normocephalic and atraumatic.     Right Ear: Tympanic membrane, ear canal and external ear normal.     Left Ear: Tympanic membrane, ear canal and external ear normal.     Nose: Nose normal.     Mouth/Throat:     Mouth: Mucous membranes are moist.     Pharynx: Oropharynx is clear.  Eyes:     Extraocular Movements:  Extraocular movements intact.     Conjunctiva/sclera: Conjunctivae normal.     Pupils: Pupils are equal, round, and reactive to light.  Cardiovascular:     Rate and Rhythm: Normal rate and regular rhythm.     Pulses: Normal pulses.          Dorsalis pedis pulses are 2+ on the right side and 2+ on the left side.     Heart sounds: Normal heart sounds.  Pulmonary:     Effort: Pulmonary effort is normal.     Breath sounds: Normal breath sounds.  Chest:  Breasts:    Tanner Score is 5.     Right: Normal.     Left: Normal.     Comments: Well healed surgical scar left breast Abdominal:     General: Abdomen is flat. Bowel sounds are normal.     Palpations: Abdomen is soft.  Genitourinary:    Comments: deferred Musculoskeletal:  General: Normal range of motion.     Cervical back: Normal range of motion and neck supple.  Feet:     Right foot:     Protective Sensation: 5 sites tested.  5 sites sensed.     Skin integrity: Dry skin present.     Toenail Condition: Right toenails are normal.     Left foot:     Protective Sensation: 5 sites tested.  5 sites sensed.     Skin integrity: Dry skin present.     Toenail Condition: Left toenails are normal.  Skin:    General: Skin is warm and dry.  Neurological:     General: No focal deficit present.     Mental Status: She is alert and oriented to person, place, and time.  Psychiatric:        Mood and Affect: Mood normal.        Behavior: Behavior normal.      Assessment And Plan:     1. Annual physical exam Comments: A full exam was performed. Importance of monthly self breast exams was discussed with the patient.  PATIENT IS ADVISED TO GET 30-45 MINUTES REGULAR EXERCISE NO LESS THAN FOUR TO FIVE DAYS PER WEEK - BOTH WEIGHTBEARING EXERCISES AND AEROBIC ARE RECOMMENDED.  PATIENT IS ADVISED TO FOLLOW A HEALTHY DIET WITH AT LEAST SIX FRUITS/VEGGIES PER DAY, DECREASE INTAKE OF RED MEAT, AND TO INCREASE FISH INTAKE TO TWO DAYS PER WEEK.   MEATS/FISH SHOULD NOT BE FRIED, BAKED OR BROILED IS PREFERABLE.  IT IS ALSO IMPORTANT TO CUT BACK ON YOUR SUGAR INTAKE. PLEASE AVOID ANYTHING WITH ADDED SUGAR, CORN SYRUP OR OTHER SWEETENERS. IF YOU MUST USE A SWEETENER, YOU CAN TRY STEVIA. IT IS ALSO IMPORTANT TO AVOID ARTIFICIALLY SWEETENERS AND DIET BEVERAGES. LASTLY, I SUGGEST WEARING SPF 50 SUNSCREEN ON EXPOSED PARTS AND ESPECIALLY WHEN IN THE DIRECT SUNLIGHT FOR AN EXTENDED PERIOD OF TIME.  PLEASE AVOID FAST FOOD RESTAURANTS AND INCREASE YOUR WATER INTAKE.  2. Type 2 diabetes mellitus with stage 3b chronic kidney disease, without long-term current use of insulin ([place]) Comments: Diabetic foot exam was performed. She will rto in 4 months for re-evaluation. PATIENT IS ADVISED TO GET 30-45 MINUTES REGULAR EXERCISE NO LESS THAN FOUR TO FIVE DAYS PER WEEK - BOTH WEIGHTBEARING EXERCISES AND AEROBIC ARE RECOMMENDED.  PATIENT IS ADVISED TO FOLLOW A HEALTHY DIET WITH AT LEAST SIX FRUITS/VEGGIES PER DAY, DECREASE INTAKE OF RED MEAT, AND TO INCREASE FISH INTAKE TO TWO DAYS PER WEEK.  MEATS/FISH SHOULD NOT BE FRIED, BAKED OR BROILED IS PREFERABLE.  IT IS ALSO IMPORTANT TO CUT BACK ON YOUR SUGAR INTAKE. PLEASE AVOID ANYTHING WITH ADDED SUGAR, CORN SYRUP OR OTHER SWEETENERS. IF YOU MUST USE A SWEETENER, YOU CAN TRY STEVIA. IT IS ALSO IMPORTANT TO AVOID ARTIFICIALLY SWEETENERS AND DIET BEVERAGES. LASTLY, I SUGGEST WEARING SPF 50 SUNSCREEN ON EXPOSED PARTS AND ESPECIALLY WHEN IN THE DIRECT SUNLIGHT FOR AN EXTENDED PERIOD OF TIME.  PLEASE AVOID FAST FOOD RESTAURANTS AND INCREASE YOUR WATER INTAKE.  - CBC no Diff - CMP14+EGFR - Hemoglobin A1c - Lipid panel - Microalbumin / Creatinine Urine Ratio - POCT Urinalysis Dipstick (81002)  3. Hypertensive heart and renal disease with renal failure, stage 1 through stage 4 or unspecified chronic kidney disease, without heart failure Comments: Chronic, well controlled. EKG performed, atrial rhythm w/ LVH.  She will c/w  olmesartan/amlodipine/hctz 40/5/25 daily. Advised to follow low salt diet.  - EKG 12-Lead - Microalbumin / Creatinine Urine Ratio -  POCT Urinalysis Dipstick (81002)  4. Mixed hyperlipidemia Comments: Chronic, LDL goal <70. She will c/w atorvastatin 60m daily. She is encouraged to follow a Mediterranean diet.   5. Recurrent UTI Comments: I will check urine culture today and treat accordingly.  - Culture, Urine  6. Immunization due Comments: She is unable to get Shingrix here, I will send to her local pharmacy.  - Zoster Vaccine Adjuvanted ([facility] injection; Inject 0.5 mLs into the muscle once for 1 dose.  Dispense: 0.5 mL; Refill: 0  Patient was given opportunity to ask questions. Patient verbalized understanding of the plan and was able to repeat key elements of the plan. All questions were answered to their satisfaction.   I, RMaximino Greenland MD, have reviewed all documentation for this visit. The documentation on 07/27/22 for the exam, diagnosis, procedures, and orders are all accurate and complete.   THE PATIENT IS ENCOURAGED TO PRACTICE SOCIAL DISTANCING DUE TO THE COVID-19 PANDEMIC.

## 2022-07-27 NOTE — Patient Instructions (Signed)
Medication Instructions:  Continue current medications  *If you need a refill on your cardiac medications before your next appointment, please call your pharmacy*   Lab Work: None Ordered    Testing/Procedures: None Ordered   Follow-Up: At [facility], you and your health needs are our priority.  As part of our continuing mission to provide you with exceptional heart care, we have created designated Provider Care Teams.  These Care Teams include your primary Cardiologist (physician) and Advanced Practice Providers (APPs -  Physician Assistants and Nurse Practitioners) who all work together to provide you with the care you need, when you need it.  We recommend signing up for the patient portal called "MyChart".  Sign up information is provided on this After Visit Summary.  MyChart is used to connect with patients for Virtual Visits (Telemedicine).  Patients are able to view lab/test results, encounter notes, upcoming appointments, etc.  Non-urgent messages can be sent to your provider as well.   To learn more about what you can do with MyChart, go to [email or web address].    Your next appointment:   As Needed       

## 2022-07-27 NOTE — Assessment & Plan Note (Signed)
Lipids are well-controlled.  Continue atorvastatin.  

## 2022-07-27 NOTE — Assessment & Plan Note (Signed)
Blood pressure well controlled at home.  It was slightly elevated initially and did improve on repeat.  Continue amlodipine/olmesartan/HCTZ.  She was encouraged to increase her exercise.

## 2022-07-27 NOTE — Assessment & Plan Note (Signed)
Symptoms have resolved.  She has a Kardia mobile device to use as needed.

## 2022-07-27 NOTE — Progress Notes (Deleted)
Cardiology Office Note   Date:  07/27/2022   ID:  Michele, Wang Apr 26, 1951, MRN [id number]  PCP:  [name], MD  Cardiologist:   Skeet Latch, MD   No chief complaint on file.      History of Present Illness: Michele Wang is a 71 y.o. female with atherosclerosis of the aorta, hypertension, stroke, diabetes, CKD 3, hyperlipidemia, and breast cancer here for follow-up.  She was first seen 09/2021 for palpitations.  She saw Dr. Baird Cancer on 06/2021 and reported intermittent palpitations.  TSH and electrolytes were normal.  She noticed palpitations several months prior but they had resolved by the time she was seen in clinic.  At the time her blood sugars were out of control and were running very high.  The episodes lasted for about 30 minutes and she noted intermittent palpitations during that time.  It mostly occurred when she was laying in bed at night.  She cut back on her caffeine intake.  I recommended that she get a Kardia mobile device for the future.  Her blood pressure in the office was slightly above goal but she wanted to work on diet and exercise as her pressures at home have been controlled.  Past Medical History:  Diagnosis Date   Breast cancer ([place])    CKD (chronic kidney disease)    Diabetes mellitus without complication ([place])    Type II   Hyperlipidemia    Hypertension    UTI (urinary tract infection)     Past Surgical History:  Procedure Laterality Date   BREAST LUMPECTOMY WITH RADIOACTIVE SEED LOCALIZATION Left 08/21/2019   Procedure: LEFT BREAST LUMPECTOMY WITH RADIOACTIVE SEED LOCALIZATION;  Surgeon: [name], MD;  Location: [facility];  Service: General;  Laterality: Left;   CESAREAN SECTION     x2     Current Outpatient Medications  Medication Sig Dispense Refill   amLODIPine (KATERZIA) 1 mg/mL SUSP oral suspension Amlodipine     atorvastatin (LIPITOR) 40 MG tablet TAKE ONE TABLET BY MOUTH ON MONDAY, WEDNESDAY, AND FRIDAY  36 tablet 3   Continuous Blood Gluc Receiver (FREESTYLE LIBRE 14 DAY READER) DEVI 1 each by Does not apply route QID. 2 each 2   Continuous Blood Gluc Sensor (FREESTYLE LIBRE 14 DAY SENSOR) MISC Use as directed to check blood sugars 4 times per day dx: e11.65 2 each 11   insulin aspart (FIASP FLEXTOUCH) 100 UNIT/ML FlexTouch Pen INJECT INTO SKIN SUBCUTANEOUS BEFORE MEALS PER SLIDING SCALE 6 mL 3   insulin detemir (LEVEMIR FLEXTOUCH) 100 UNIT/ML FlexPen INJECT 22 UNITS SUBCUTANEOUSLY EVERY DAY AT BEDTIME AS DIRECTED -  MAXIMUM DOSE 60 UNITS - DISCARD OPENED PENS 42 DAYS AFTER FIRST USE,  DO NOT REFRIGERATE INDIVIDUAL PENS AFTER FIRST USE. 15 mL 1   Insulin Pen Needle (PEN NEEDLES 3/16") 31G X 5 MM MISC Use as directed, once daily. 90 each 2   Olmesartan-amLODIPine-HCTZ 40-5-25 MG TABS TAKE ONE TABLET BY MOUTH EVERY DAY 90 tablet 2   Semaglutide,0.25 or 0.'5MG'$ /DOS, (OZEMPIC, 0.25 OR 0.5 MG/DOSE,) 2 MG/1.5ML SOPN Inject 0.5 mg into the skin once a week. 3 mL 3   tamoxifen (NOLVADEX) 20 MG tablet Take 1 tablet (20 mg total) by mouth daily. 90 tablet 3   Vitamin D, Cholecalciferol, 50 MCG (2000 UT) CAPS Take 2,000 Units by mouth daily.  (Patient not taking: Reported on 11/30/2021)     Vitamin D, Ergocalciferol, (DRISDOL) 1.25 MG (50000 UNIT) CAPS capsule Vit D  No current facility-administered medications for this visit.    Allergies:   Patient has no known allergies.    Social History:  The patient  reports that she has never smoked. She has never used smokeless tobacco. She reports that she does not drink alcohol and does not use drugs.   Family History:  The patient's family history includes Aneurysm in her mother; Healthy in her father.    ROS:  Please see the history of present illness.   Otherwise, review of systems are positive for none.   All other systems are reviewed and negative.    PHYSICAL EXAM: VS:  There were no vitals taken for this visit. , BMI There is no height or weight on  file to calculate BMI. GENERAL:  Well appearing HEENT:  Pupils equal round and reactive, fundi not visualized, oral mucosa unremarkable NECK:  No jugular venous distention, waveform within normal limits, carotid upstroke brisk and symmetric, no bruits, no thyromegaly LYMPHATICS:  No cervical adenopathy LUNGS:  Clear to auscultation bilaterally HEART:  RRR.  PMI not displaced or sustained,S1 and S2 within normal limits, no S3, no S4, no clicks, no rubs, no murmurs ABD:  Flat, positive bowel sounds normal in frequency in pitch, no bruits, no rebound, no guarding, no midline pulsatile mass, no hepatomegaly, no splenomegaly EXT:  2 plus pulses throughout, no edema, no cyanosis no clubbing SKIN:  No rashes no nodules NEURO:  Cranial nerves II through XII grossly intact, motor grossly intact throughout PSYCH:  Cognitively intact, oriented to person place and time    EKG:  EKG is ordered today. The ekg ordered today demonstrates sinus rhythm.  Rate 63 bpm.  LVH.   Recent Labs: 02/28/2022: ALT 19; BUN 28; Creatinine, Ser 1.63; Hemoglobin 11.6; Platelets 242; Potassium 4.4; Sodium 142    Lipid Panel    Component Value Date/Time   CHOL 133 02/28/2022 0902   TRIG 126 02/28/2022 0902   HDL 60 02/28/2022 0902   CHOLHDL 2.2 02/28/2022 0902   CHOLHDL 5.5 09/17/2009 0200   VLDL 31 09/17/2009 0200   LDLCALC 51 02/28/2022 0902      Wt Readings from Last 3 Encounters:  07/21/22 156 lb 11.2 oz (71.1 kg)  02/23/22 160 lb 7.9 oz (72.8 kg)  02/23/22 160 lb 6.4 oz (72.8 kg)      ASSESSMENT AND PLAN:  #Palpitations: Symptoms have resolved.  At the time her blood sugars were out of control and her caffeine intake was higher.  I suspect this was contributing.  Labs were unremarkable.  Given that she is not having the symptoms anymore, we will not get an ambulatory monitor.  We did discuss her getting a Kardia mobile device.  That way she can capture if it recurs.  #Atherosclerosis of the  aorta: She had a chest CT 06/2020 that revealed atherosclerosis of the aorta.  Lipids are at goal.  LDL was 66.  Continue atorvastatin.  #Hypertension: BP slightly above goal of <130/80.  She is going to work on increasing her exercise rather than changing medicines at this time.  Continue olmesartan, amlodipine, and HCTZ.  Current medicines are reviewed at length with the patient today.  The patient does not have concerns regarding medicines.  The following changes have been made:  no change  Labs/ tests ordered today include:   No orders of the defined types were placed in this encounter.    Disposition:   FU with Yanin Muhlestein C. Oval Linsey, MD, [facility] in 6 months.  Signed, Nicklas Mcsweeney C. Oval Linsey, MD, [facility]  07/27/2022 5:27 AM    [employer]

## 2022-07-27 NOTE — Progress Notes (Signed)
Cardiology Office Note   Date:  07/27/2022   ID:  Eman, Rynders 1950-12-30, MRN [id number]  PCP:  [name], MD  Cardiologist:   Skeet Latch, MD   No chief complaint on file.  History of Present Illness: Michele Wang is a 71 y.o. female with atherosclerosis of the aorta, hypertension, stroke, diabetes, CKD 3, hyperlipidemia, and breast cancer here for follow-up.  She was first seen 09/2021 for palpitations.  She saw Dr. Baird Cancer on 06/2021 and reported intermittent palpitations.  TSH and electrolytes were normal.  She noticed palpitations several months prior but they had resolved by the time she was seen in clinic.  At the time her blood sugars were out of control and were running very high.  The episodes lasted for about 30 minutes and she noted intermittent palpitations during that time.  It mostly occurred when she was laying in bed at night.  She cut back on her caffeine intake.  I recommended that she get a Kardia mobile device for the future.  Her blood pressure in the office was slightly above goal but she wanted to work on diet and exercise as her pressures at home have been controlled.  Today, she states that things have been going well. She reports that she saw her PCP earlier today and her blood pressure reading was 120/60s. Upon recheck in clinic today, her blood pressure was 122/82. She has not been getting that much exercise. Normally she would walk and go on the treadmill at the [facility], but has not been going lately. She denies any palpitations, chest pain, shortness of breath, or peripheral edema. No lightheadedness, headaches, syncope, orthopnea, or PND.   Past Medical History:  Diagnosis Date   Breast cancer ([place])    CKD (chronic kidney disease)    Diabetes mellitus without complication ([place])    Type II   Hyperlipidemia    Hypertension    UTI (urinary tract infection)     Past Surgical History:  Procedure Laterality Date   BREAST LUMPECTOMY WITH  RADIOACTIVE SEED LOCALIZATION Left 08/21/2019   Procedure: LEFT BREAST LUMPECTOMY WITH RADIOACTIVE SEED LOCALIZATION;  Surgeon: Jovita Kussmaul, MD;  Location: [place];  Service: General;  Laterality: Left;   CESAREAN SECTION     x2     Current Outpatient Medications  Medication Sig Dispense Refill   atorvastatin (LIPITOR) 40 MG tablet TAKE ONE TABLET BY MOUTH ON MONDAY, WEDNESDAY, AND FRIDAY 36 tablet 3   Continuous Blood Gluc Receiver (FREESTYLE LIBRE 14 DAY READER) DEVI 1 each by Does not apply route QID. 2 each 2   Continuous Blood Gluc Sensor (FREESTYLE LIBRE 14 DAY SENSOR) MISC Use as directed to check blood sugars 4 times per day dx: e11.65 2 each 11   insulin aspart (FIASP FLEXTOUCH) 100 UNIT/ML FlexTouch Pen INJECT INTO SKIN SUBCUTANEOUS BEFORE MEALS PER SLIDING SCALE 6 mL 3   insulin detemir (LEVEMIR FLEXTOUCH) 100 UNIT/ML FlexPen INJECT 22 UNITS SUBCUTANEOUSLY EVERY DAY AT BEDTIME AS DIRECTED -  MAXIMUM DOSE 60 UNITS - DISCARD OPENED PENS 42 DAYS AFTER FIRST USE,  DO NOT REFRIGERATE INDIVIDUAL PENS AFTER FIRST USE. 15 mL 1   Insulin Pen Needle (PEN NEEDLES 3/16") 31G X 5 MM MISC Use as directed, once daily. 90 each 2   Olmesartan-amLODIPine-HCTZ 40-5-25 MG TABS TAKE ONE TABLET BY MOUTH EVERY DAY 90 tablet 2   Semaglutide,0.25 or 0.'5MG'$ /DOS, (OZEMPIC, 0.25 OR 0.5 MG/DOSE,) 2 MG/1.5ML SOPN Inject 0.5 mg into  the skin once a week. 3 mL 3   tamoxifen (NOLVADEX) 20 MG tablet Take 1 tablet (20 mg total) by mouth daily. 90 tablet 3   Vitamin D, Cholecalciferol, 50 MCG (2000 UT) CAPS Take 2,000 Units by mouth daily.     Vitamin D, Ergocalciferol, (DRISDOL) 1.25 MG (50000 UNIT) CAPS capsule      Zoster Vaccine Adjuvanted [facility]) injection Inject 0.5 mLs into the muscle once for 1 dose. 0.5 mL 0   No current facility-administered medications for this visit.    Allergies:   Patient has no known allergies.    Social History:  The patient  reports that she has never  smoked. She has never used smokeless tobacco. She reports that she does not drink alcohol and does not use drugs.   Family History:  The patient's family history includes Aneurysm in her mother; Healthy in her father.    ROS:   Please see the history of present illness.  All other systems are reviewed and negative.    PHYSICAL EXAM: VS:  BP 122/82 (BP Location: Left Arm, Patient Position: Sitting, Cuff Size: Normal)   Pulse 77   Ht '5\' 5"'$  (1.651 m)   Wt 159 lb 8 oz (72.3 kg)   SpO2 96%   BMI 26.54 kg/m  , BMI Body mass index is 26.54 kg/m. GENERAL:  Well appearing HEENT:  Pupils equal round and reactive, fundi not visualized, oral mucosa unremarkable NECK:  No jugular venous distention, waveform within normal limits, carotid upstroke brisk and symmetric, no bruits, no thyromegaly LUNGS:  Clear to auscultation bilaterally HEART:  RRR.  PMI not displaced or sustained,S1 and S2 within normal limits, no S3, no S4, no clicks, no rubs, no murmurs ABD:  Flat, positive bowel sounds normal in frequency in pitch, no bruits, no rebound, no guarding, no midline pulsatile mass, no hepatomegaly, no splenomegaly EXT:  2 plus pulses throughout, no edema, no cyanosis no clubbing SKIN:  No rashes no nodules NEURO:  Cranial nerves II through XII grossly intact, motor grossly intact throughout PSYCH:  Cognitively intact, oriented to person place and time   EKG: EKG is personally reviewed.   07/27/22: EKG was not ordered.  PCP EKG sinus rhythm.  Rate 69 bpm.   10/04/21: Sinus rhythm.  Rate 63 bpm.  LVH.   Recent Labs: 02/28/2022: ALT 19; BUN 28; Creatinine, Ser 1.63; Hemoglobin 11.6; Platelets 242; Potassium 4.4; Sodium 142    Lipid Panel    Component Value Date/Time   CHOL 133 02/28/2022 0902   TRIG 126 02/28/2022 0902   HDL 60 02/28/2022 0902   CHOLHDL 2.2 02/28/2022 0902   CHOLHDL 5.5 09/17/2009 0200   VLDL 31 09/17/2009 0200   LDLCALC 51 02/28/2022 0902      Wt Readings from Last 3  Encounters:  07/27/22 159 lb 8 oz (72.3 kg)  07/27/22 157 lb 12.8 oz (71.6 kg)  07/21/22 156 lb 11.2 oz (71.1 kg)      ASSESSMENT AND PLAN:  Aortic atherosclerosis (HCC) Lipids are well controlled.  Continue atorvastatin.  Hypertension Blood pressure well controlled at home.  It was slightly elevated initially and did improve on repeat.  Continue amlodipine/olmesartan/HCTZ.  She was encouraged to increase her exercise.    Stroke (cerebrum) (HCC) Blood pressures well controlled and lipids are under control.  BP goal less than 130/80.  Palpitations Symptoms have resolved.  She has a Kardia mobile device to use as needed.     Medications Adjusted/Labs and Tests  Ordered: Current medicines are reviewed at length with the patient today.  The patient does not have concerns regarding medicines.  The following changes have been made:  no change  Labs/ tests ordered today include:   No orders of the defined types were placed in this encounter.    Disposition: FU with Doris Mcgilvery C. Oval Linsey, MD, [facility] as needed.    I,Breanna Adamick,acting as a scribe for Skeet Latch, MD.,have documented all relevant documentation on the behalf of Skeet Latch, MD,as directed by  Skeet Latch, MD while in the presence of Skeet Latch, MD.   Vickie Ponds C. Oval Linsey, MD, [facility] as needed.  Signed, Jaiana Sheffer C. Oval Linsey, MD, [facility]  07/27/2022 10:32 AM    [place]

## 2022-07-27 NOTE — Assessment & Plan Note (Signed)
Blood pressures well controlled and lipids are under control.  BP goal less than 130/80.

## 2022-07-27 NOTE — Patient Instructions (Signed)

## 2022-07-28 LAB — CMP14+EGFR
ALT: 18 IU/L (ref 0–32)
AST: 17 IU/L (ref 0–40)
Albumin/Globulin Ratio: 1.4 (ref 1.2–2.2)
Albumin: 4.2 g/dL (ref 3.8–4.8)
Alkaline Phosphatase: 68 IU/L (ref 44–121)
BUN/Creatinine Ratio: 12 (ref 12–28)
BUN: 18 mg/dL (ref 8–27)
Bilirubin Total: 0.4 mg/dL (ref 0.0–1.2)
CO2: 22 mmol/L (ref 20–29)
Calcium: 9.7 mg/dL (ref 8.7–10.3)
Chloride: 102 mmol/L (ref 96–106)
Creatinine, Ser: 1.54 mg/dL — ABNORMAL HIGH (ref 0.57–1.00)
Globulin, Total: 3 g/dL (ref 1.5–4.5)
Glucose: 117 mg/dL — ABNORMAL HIGH (ref 70–99)
Potassium: 4.1 mmol/L (ref 3.5–5.2)
Sodium: 143 mmol/L (ref 134–144)
Total Protein: 7.2 g/dL (ref 6.0–8.5)
eGFR: 36 mL/min/{1.73_m2} — ABNORMAL LOW (ref >59)

## 2022-07-28 LAB — LIPID PANEL
Chol/HDL Ratio: 2.4 ratio (ref 0.0–4.4)
Cholesterol, Total: 144 mg/dL (ref 100–199)
HDL: 60 mg/dL (ref >39)
LDL Chol Calc (NIH): 64 mg/dL (ref 0–99)
Triglycerides: 111 mg/dL (ref 0–149)
VLDL Cholesterol Cal: 20 mg/dL (ref 5–40)

## 2022-07-28 LAB — HEMOGLOBIN A1C
Est. average glucose Bld gHb Est-mCnc: 177 mg/dL
Hgb A1c MFr Bld: 7.8 % — ABNORMAL HIGH (ref 4.8–5.6)

## 2022-07-29 ENCOUNTER — Ambulatory Visit: Payer: Self-pay

## 2022-07-29 LAB — MICROALBUMIN / CREATININE URINE RATIO
Creatinine, Urine: 162 mg/dL
Microalb/Creat Ratio: 18 mg/g creat (ref 0–29)
Microalbumin, Urine: 29 ug/mL

## 2022-07-29 LAB — CBC
Hematocrit: 35.6 % (ref 34.0–46.6)
Hemoglobin: 11.4 g/dL (ref 11.1–15.9)
MCH: 27.9 pg (ref 26.6–33.0)
MCHC: 32 g/dL (ref 31.5–35.7)
MCV: 87 fL (ref 79–97)
Platelets: 227 10*3/uL (ref 150–450)
RBC: 4.08 x10E6/uL (ref 3.77–5.28)
RDW: 14.6 % (ref 11.7–15.4)
WBC: 5.7 10*3/uL (ref 3.4–10.8)

## 2022-07-29 NOTE — Patient Outreach (Signed)
  Care Coordination   07/29/2022 Name: Michele Wang MRN: [id number] DOB: [birth year]   Care Coordination Outreach Attempts:  An unsuccessful telephone outreach was attempted for a scheduled appointment today.  Follow Up Plan:  Additional outreach attempts will be made to offer the patient care coordination information and services.   Encounter Outcome:  No Answer  Care Coordination Interventions Activated:  No   Care Coordination Interventions:  No, not indicated    Barb Merino, RN, BSN, CCM Care Management Coordinator [facility] Care Management Direct Phone: [phone]

## 2022-07-30 LAB — URINE CULTURE

## 2022-08-08 ENCOUNTER — Other Ambulatory Visit: Payer: Self-pay

## 2022-08-08 MED ORDER — CEPHALEXIN 500 MG PO CAPS: 500.0000 mg | ORAL_CAPSULE | Freq: Three times a day (TID) | ORAL | 0 refills | Status: AC

## 2022-08-09 ENCOUNTER — Ambulatory Visit: Payer: Self-pay

## 2022-08-09 NOTE — Patient Outreach (Signed)
  Care Coordination   08/09/2022 Name: Michele Wang MRN: [id number] DOB: 1951-07-26   Care Coordination Outreach Attempts:  An unsuccessful telephone outreach was attempted for a scheduled appointment today.  Follow Up Plan:  Additional outreach attempts will be made to offer the patient care coordination information and services.   Encounter Outcome:  No Answer  Care Coordination Interventions Activated:  No   Care Coordination Interventions:  No, not indicated    Barb Merino, RN, BSN, CCM Care Management Coordinator [facility] Care Management Direct Phone: [phone]

## 2022-08-16 ENCOUNTER — Ambulatory Visit: Payer: Self-pay

## 2022-08-16 NOTE — Patient Outreach (Signed)
  Care Coordination   08/16/2022 Name: Michele Wang MRN: [id number] DOB: [birth year]   Care Coordination Outreach Attempts:  An unsuccessful telephone outreach was attempted for a scheduled appointment today.  Follow Up Plan:  Additional outreach attempts will be made to offer the patient care coordination information and services.   Encounter Outcome:  Pt. Request to Call Back  Care Coordination Interventions Activated:  No   Care Coordination Interventions:  No, not indicated    Barb Merino, RN, BSN, CCM Care Management Coordinator [place] Management Direct Phone: [phone]

## 2022-08-23 ENCOUNTER — Ambulatory Visit: Payer: Self-pay

## 2022-08-23 NOTE — Patient Outreach (Signed)
  Care Coordination   08/23/2022 Name: Michele Wang MRN: [id number] DOB: 1951-06-12   Care Coordination Outreach Attempts:  An unsuccessful telephone outreach was attempted for a scheduled appointment today.  Follow Up Plan:  No further outreach attempts will be made at this time. We have been unable to contact the patient to offer or enroll patient in care coordination services  Encounter Outcome:  No Answer  Care Coordination Interventions Activated:  No   Care Coordination Interventions:  No, not indicated    Barb Merino, RN, BSN, CCM Care Management Coordinator [place] Management Direct Phone: [phone]

## 2022-08-25 ENCOUNTER — Ambulatory Visit (INDEPENDENT_AMBULATORY_CARE_PROVIDER_SITE_OTHER): Payer: Medicare PPO | Admitting: Ophthalmology

## 2022-08-25 ENCOUNTER — Encounter (INDEPENDENT_AMBULATORY_CARE_PROVIDER_SITE_OTHER): Payer: Self-pay | Admitting: Ophthalmology

## 2022-08-25 DIAGNOSIS — E113393 Type 2 diabetes mellitus with moderate nonproliferative diabetic retinopathy without macular edema, bilateral: Secondary | ICD-10-CM | POA: Diagnosis not present

## 2022-08-25 DIAGNOSIS — H43811 Vitreous degeneration, right eye: Secondary | ICD-10-CM | POA: Diagnosis not present

## 2022-08-25 DIAGNOSIS — H2523 Age-related cataract, morgagnian type, bilateral: Secondary | ICD-10-CM

## 2022-08-25 DIAGNOSIS — H2513 Age-related nuclear cataract, bilateral: Secondary | ICD-10-CM | POA: Diagnosis not present

## 2022-08-25 DIAGNOSIS — H43813 Vitreous degeneration, bilateral: Secondary | ICD-10-CM

## 2022-08-25 DIAGNOSIS — H43812 Vitreous degeneration, left eye: Secondary | ICD-10-CM

## 2022-08-25 LAB — HM DIABETES EYE EXAM

## 2022-08-25 NOTE — Assessment & Plan Note (Signed)
Stable over time no holes or tears

## 2022-08-25 NOTE — Assessment & Plan Note (Signed)
Physiologic no holes or tears 

## 2022-08-25 NOTE — Progress Notes (Signed)
08/25/2022     CHIEF COMPLAINT Patient presents for  Chief Complaint  Patient presents with   Diabetic Retinopathy without Macular Edema      HISTORY OF PRESENT ILLNESS: Michele Wang is a 71 y.o. female who presents to the clinic today for:   HPI   9 MOS FOR DILATE OU, COLOR FP, OCT. Pt stated vision has slightly worsened. Pt was prescribed a stronger prescription for glasses. Pt denies new floaters and FOL.    Last edited by Silvestre Moment on 08/25/2022  8:11 AM.      Referring physician: Clent Jacks, MD [address] [place],  [place] [address]  HISTORICAL INFORMATION:   Selected notes from the MEDICAL RECORD NUMBER    Lab Results  Component Value Date   HGBA1C 7.8 (H) 07/27/2022     CURRENT MEDICATIONS: No current outpatient medications on file. (Ophthalmic Drugs)   No current facility-administered medications for this visit. (Ophthalmic Drugs)   Current Outpatient Medications (Other)  Medication Sig   atorvastatin (LIPITOR) 40 MG tablet TAKE ONE TABLET BY MOUTH ON MONDAY, WEDNESDAY, AND FRIDAY   Continuous Blood Gluc Receiver (FREESTYLE LIBRE 14 DAY READER) DEVI 1 each by Does not apply route QID.   Continuous Blood Gluc Sensor (FREESTYLE LIBRE 14 DAY SENSOR) MISC Use as directed to check blood sugars 4 times per day dx: e11.65   insulin aspart (FIASP FLEXTOUCH) 100 UNIT/ML FlexTouch Pen INJECT INTO SKIN SUBCUTANEOUS BEFORE MEALS PER SLIDING SCALE   insulin detemir (LEVEMIR FLEXTOUCH) 100 UNIT/ML FlexPen INJECT 22 UNITS SUBCUTANEOUSLY EVERY DAY AT BEDTIME AS DIRECTED -  MAXIMUM DOSE 60 UNITS - DISCARD OPENED PENS 42 DAYS AFTER FIRST USE,  DO NOT REFRIGERATE INDIVIDUAL PENS AFTER FIRST USE.   Insulin Pen Needle (PEN NEEDLES 3/16") 31G X 5 MM MISC Use as directed, once daily.   Olmesartan-amLODIPine-HCTZ 40-5-25 MG TABS TAKE ONE TABLET BY MOUTH EVERY DAY   Semaglutide,0.25 or 0.'5MG'$ /DOS, (OZEMPIC, 0.25 OR 0.5 MG/DOSE,) 2 MG/1.5ML SOPN Inject 0.5 mg into the skin  once a week.   tamoxifen (NOLVADEX) 20 MG tablet Take 1 tablet (20 mg total) by mouth daily.   Vitamin D, Cholecalciferol, 50 MCG (2000 UT) CAPS Take 2,000 Units by mouth daily.   Vitamin D, Ergocalciferol, (DRISDOL) 1.25 MG (50000 UNIT) CAPS capsule    No current facility-administered medications for this visit. (Other)      REVIEW OF SYSTEMS: ROS   Negative for: Constitutional, Gastrointestinal, Neurological, Skin, Genitourinary, Musculoskeletal, HENT, Endocrine, Cardiovascular, Eyes, Respiratory, Psychiatric, Allergic/Imm, Heme/Lymph Last edited by Silvestre Moment on 08/25/2022  8:10 AM.       ALLERGIES No Known Allergies  PAST MEDICAL HISTORY Past Medical History:  Diagnosis Date   Breast cancer ([place])    CKD (chronic kidney disease)    Diabetes mellitus without complication ([place])    Type II   Hyperlipidemia    Hypertension    UTI (urinary tract infection)    Vitreomacular adhesion of left eye 05/19/2020   Past Surgical History:  Procedure Laterality Date   BREAST LUMPECTOMY WITH RADIOACTIVE SEED LOCALIZATION Left 08/21/2019   Procedure: LEFT BREAST LUMPECTOMY WITH RADIOACTIVE SEED LOCALIZATION;  Surgeon: Jovita Kussmaul, MD;  Location: [place];  Service: General;  Laterality: Left;   CESAREAN SECTION     x2    FAMILY HISTORY Family History  Problem Relation Age of Onset   Aneurysm Mother    Healthy Father     SOCIAL HISTORY Social  History   Tobacco Use   Smoking status: Never   Smokeless tobacco: Never  Vaping Use   Vaping Use: Never used  Substance Use Topics   Alcohol use: No   Drug use: No         OPHTHALMIC EXAM:  Base Eye Exam     Visual Acuity (ETDRS)       Right Left   Dist [place] 20/25 -1 +1 20/25 -2         Tonometry (Tonopen, 8:16 AM)       Right Left   Pressure 14 10         Pupils       Pupils APD   Right PERRL None   Left PERRL None         Visual Fields       Left Right    Full Full          Extraocular Movement       Right Left    Full, Ortho Full, Ortho         Neuro/Psych     Oriented x3: Yes   Mood/Affect: Normal         Dilation     Both eyes: 1.0% Mydriacyl, 2.5% Phenylephrine @ 8:16 AM           Slit Lamp and Fundus Exam     External Exam       Right Left   External Normal Normal         Slit Lamp Exam       Right Left   Lids/Lashes Normal Normal   Conjunctiva/Sclera White and quiet White and quiet   Cornea Clear Clear   Anterior Chamber Deep and quiet Deep and quiet   Iris Round and reactive Round and reactive   Lens 2+ Nuclear sclerosis 2+ Nuclear sclerosis   Anterior Vitreous Normal Normal         Fundus Exam       Right Left   Posterior Vitreous ,, Posterior vitreous detachment Normal   Disc Normal Normal   C/D Ratio 0.45 0.5   Macula ,, no exudates, no macular thickening, no clinically significant macular edema, Microaneurysms ,, no exudates, no macular thickening, Microaneurysms, no clinically significant macular edema   Vessels NPDR- Moderate NPDR- Moderate   Periphery Normal Normal            IMAGING AND PROCEDURES  Imaging and Procedures for 08/25/22  OCT, Retina - OU - Both Eyes       Right Eye Quality was good. Scan locations included subfoveal. Central Foveal Thickness: 272. Progression has been stable. Findings include normal foveal contour.   Left Eye Quality was good. Scan locations included subfoveal. Central Foveal Thickness: 245. Progression has been stable. Findings include normal foveal contour.   Notes Incidental posterior vitreous detachment right eye  OS with PVD now  No active maculopathy OU     Color Fundus Photography Optos - OU - Both Eyes       Right Eye Progression has been stable. Disc findings include normal observations. Macula : normal observations. Vessels : normal observations. Periphery : normal observations.   Left Eye Progression has been stable. Macula : normal  observations. Vessels : normal observations. Periphery : normal observations.   Notes Stable OU.  Moderate NPDR observe             ASSESSMENT/PLAN:  Moderate nonproliferative diabetic retinopathy of both eyes without macular edema associated with type  2 diabetes mellitus ([place]) No progression OU will observe  The nature of moderate nonproliferative diabetic retinopathy was discussed with the patient as well as the need for more frequent follow up to judge for progression. Good blood glucose, blood pressure, and serum lipid control was recommended as well as avoidance of smoking and maintenance of normal weight.  Close follow up with PCP encouraged.  Posterior vitreous detachment of left eye  Physiologic no holes or tears  Nuclear sclerotic cataract of both eyes The nature of cataract was discussed with the patient as well as the elective nature of surgery. The patient was reassured that surgery at a later date does not put the patient at risk for a worse outcome. It was emphasized that the need for surgery is dictated by the patient's quality of life as influenced by the cataract. Patient was instructed to maintain close follow up with their general eye care doctor.  Sunglasses analogy, discussed  Posterior vitreous detachment of right eye Stable over time no holes or tears     ICD-10-CM   1. Moderate nonproliferative diabetic retinopathy of both eyes without macular edema associated with type 2 diabetes mellitus (HCC)  Z60.1093 OCT, Retina - OU - Both Eyes    Color Fundus Photography Optos - OU - Both Eyes    2. Posterior vitreous detachment of left eye  H43.812     3. Nuclear sclerotic cataract of both eyes  H25.13     4. Posterior vitreous detachment of right eye  H43.811       1.  Follow-up Dr. Katy Fitch as scheduled.  2.  Cataracts are being monitored.  I explained the effect on nighttime driving and/or dark rainy days.  3.  No significant progression of the diabetic  retinopathy to the neck stage.  Thus 29-monthfollow-up is appropriate  Ophthalmic Meds Ordered this visit:  No orders of the defined types were placed in this encounter.      Return in about 9 months (around 05/26/2023) for DILATE OU, COLOR FP, OCT.  There are no Patient Instructions on file for this visit.   Explained the diagnoses, plan, and follow up with the patient and they expressed understanding.  Patient expressed understanding of the importance of proper follow up care.   GClent DemarkRankin M.D. Diseases & Surgery of the Retina and Vitreous Retina & Diabetic EWest Vero Corrid[place]09/28/23     Abbreviations: M myopia (nearsighted); A astigmatism; H hyperopia (farsighted); P presbyopia; Mrx spectacle prescription;  CTL contact lenses; OD right eye; OS left eye; OU both eyes  XT exotropia; ET esotropia; PEK punctate epithelial keratitis; PEE punctate epithelial erosions; DES dry eye syndrome; MGD meibomian gland dysfunction; ATs artificial tears; PFAT's preservative free artificial tears; NLindennuclear sclerotic cataract; PSC posterior subcapsular cataract; ERM epi-retinal membrane; PVD posterior vitreous detachment; RD retinal detachment; DM diabetes mellitus; DR diabetic retinopathy; NPDR non-proliferative diabetic retinopathy; PDR proliferative diabetic retinopathy; CSME clinically significant macular edema; DME diabetic macular edema; dbh dot blot hemorrhages; CWS cotton wool spot; POAG primary open angle glaucoma; C/D cup-to-disc ratio; HVF humphrey visual field; GVF goldmann visual field; OCT optical coherence tomography; IOP intraocular pressure; BRVO Branch retinal vein occlusion; CRVO central retinal vein occlusion; CRAO central retinal artery occlusion; BRAO branch retinal artery occlusion; RT retinal tear; SB scleral buckle; PPV pars plana vitrectomy; VH Vitreous hemorrhage; PRP panretinal laser photocoagulation; IVK intravitreal kenalog; VMT vitreomacular traction; MH Macular hole;  NVD  neovascularization of the disc; NVE neovascularization elsewhere; AREDS age related eye  disease study; ARMD age related macular degeneration; POAG primary open angle glaucoma; EBMD epithelial/anterior basement membrane dystrophy; ACIOL anterior chamber intraocular lens; IOL intraocular lens; PCIOL posterior chamber intraocular lens; Phaco/IOL phacoemulsification with intraocular lens placement; [place] photorefractive keratectomy; LASIK laser assisted in situ keratomileusis; HTN hypertension; DM diabetes mellitus; COPD chronic obstructive pulmonary disease

## 2022-08-25 NOTE — Assessment & Plan Note (Addendum)
No progression OU will observe  The nature of moderate nonproliferative diabetic retinopathy was discussed with the patient as well as the need for more frequent follow up to judge for progression. Good blood glucose, blood pressure, and serum lipid control was recommended as well as avoidance of smoking and maintenance of normal weight.  Close follow up with PCP encouraged.

## 2022-08-25 NOTE — Assessment & Plan Note (Signed)
The nature of cataract was discussed with the patient as well as the elective nature of surgery. The patient was reassured that surgery at a later date does not put the patient at risk for a worse outcome. It was emphasized that the need for surgery is dictated by the patient's quality of life as influenced by the cataract. Patient was instructed to maintain close follow up with their general eye care doctor.  Sunglasses analogy, discussed

## 2022-08-27 ENCOUNTER — Encounter: Payer: Self-pay | Admitting: Internal Medicine

## 2022-08-29 ENCOUNTER — Other Ambulatory Visit: Payer: Medicare PPO

## 2022-08-29 DIAGNOSIS — N390 Urinary tract infection, site not specified: Secondary | ICD-10-CM | POA: Diagnosis not present

## 2022-08-31 LAB — URINE CULTURE

## 2022-09-01 ENCOUNTER — Other Ambulatory Visit: Payer: Self-pay | Admitting: Internal Medicine

## 2022-09-01 MED ORDER — AMOXICILLIN-POT CLAVULANATE 500-125 MG PO TABS: ORAL_TABLET | ORAL | 0 refills | Status: DC

## 2022-09-07 ENCOUNTER — Other Ambulatory Visit: Payer: Self-pay

## 2022-09-07 MED ORDER — LEVEMIR FLEXTOUCH 100 UNIT/ML ~~LOC~~ SOPN: PEN_INJECTOR | SUBCUTANEOUS | 1 refills | Status: DC

## 2022-09-12 ENCOUNTER — Telehealth: Payer: Self-pay

## 2022-09-12 NOTE — Chronic Care Management (AMB) (Signed)
    Called Nicola Police, No answer, left message of appointment on 09-14-2022 at 2:00 via telephone visit with [name], Pharm D. Notified to have all medications, supplements, blood pressure and/or blood sugar logs available during appointment and to return call if need to reschedule.   Care Gaps: Shingrix overdue Covid booster overdue Flu vaccine overdue  Star Rating Drug: Atorvastatin 40 mg- [facility] fills Olmesartan/Amlodipine/HCTZ 40-5-25 mg-[facility] fills Ozempic 0.5 mg- [facility] fills     Danielson Catering manager [phone]

## 2022-09-14 ENCOUNTER — Other Ambulatory Visit: Payer: Self-pay | Admitting: Internal Medicine

## 2022-09-14 ENCOUNTER — Ambulatory Visit: Payer: Medicare PPO

## 2022-09-14 DIAGNOSIS — I1310 Hypertensive heart and chronic kidney disease without heart failure, with stage 1 through stage 4 chronic kidney disease, or unspecified chronic kidney disease: Secondary | ICD-10-CM

## 2022-09-14 DIAGNOSIS — E1122 Type 2 diabetes mellitus with diabetic chronic kidney disease: Secondary | ICD-10-CM

## 2022-09-14 NOTE — Progress Notes (Signed)
Chronic Care Management Pharmacy Note  09/14/2022 Name:  Michele Wang MRN:  [id number] DOB:  1951/02/27  Summary: Patient reports that she is not using her sliding scale because when she checks her BS after eating is never over 150.   Recommendations/Changes made from today's visit: Educated patient on the importance of checking her BS prior to meals to determine sliding scale regimen  Recommend checking BP at least twice per week.   Plan: Michele Wang is going to start checking her glucose in the morning, before meals and then about 2 hours after meals  She will start taking her sliding scale insulin based on these readings  Start recording BP readings in a log book and bring to office visit    Subjective: Michele Wang is an 71 y.o. year old female who is a primary patient of [name], MD.  The CCM team was consulted for assistance with disease management and care coordination needs.    Engaged with patient by telephone for follow up visit in response to provider referral for pharmacy case management and/or care coordination services.   Consent to Services:  The patient was given information about Chronic Care Management services, agreed to services, and gave verbal consent prior to initiation of services.  Please see initial visit note for detailed documentation.   Patient Care Team: [name], MD as PCP - General (Internal Medicine) Kyung Rudd, MD as Consulting Physician (Radiation Oncology) Jovita Kussmaul, MD as Consulting Physician (General Surgery) Nicholas Lose, MD as Consulting Physician (Hematology and Oncology) Zadie Rhine Clent Demark, MD as Consulting Physician (Ophthalmology) [name], RN as [name] Management Mayford Knife, [facility] (Pharmacist)  Recent office visits: 07/19/2022 PCP OV  Recent consult visits: 08/25/2022 Retina specialist  07/27/2022 Cardiology OV 07/21/2022 Oncology OV  Hospital visits: None in previous 6  months   Objective:  Lab Results  Component Value Date   CREATININE 1.54 (H) 07/27/2022   BUN 18 07/27/2022   EGFR 36 (L) 07/27/2022   GFRNONAA 26 (L) 08/29/2021   GFRAA 28 (L) 09/08/2020   NA 143 07/27/2022   K 4.1 07/27/2022   CALCIUM 9.7 07/27/2022   CO2 22 07/27/2022   GLUCOSE 117 (H) 07/27/2022    Lab Results  Component Value Date/Time   HGBA1C 7.8 (H) 07/27/2022 09:34 AM   HGBA1C 7.3 (H) 02/28/2022 09:02 AM   MICROALBUR 30 07/19/2021 04:17 PM   MICROALBUR 30 02/17/2021 02:00 PM    Last diabetic Eye exam:  Lab Results  Component Value Date/Time   HMDIABEYEEXA Retinopathy (A) 08/25/2022 12:00 AM    Last diabetic Foot exam: No results found for: "HMDIABFOOTEX"   Lab Results  Component Value Date   CHOL 144 07/27/2022   HDL 60 07/27/2022   LDLCALC 64 07/27/2022   TRIG 111 07/27/2022   CHOLHDL 2.4 07/27/2022       Latest Ref Rng & Units 07/27/2022    9:34 AM 02/28/2022    9:02 AM 08/29/2021   12:39 AM  Hepatic Function  Total Protein 6.0 - 8.5 g/dL 7.2  7.3  7.3   Albumin 3.8 - 4.8 g/dL 4.2  4.3  3.8   AST 0 - 40 IU/L 17  21  25    ALT 0 - 32 IU/L 18  19  22    Alk Phosphatase 44 - 121 IU/L 68  62  61   Total Bilirubin 0.0 - 1.2 mg/dL 0.4  0.2  0.6  Lab Results  Component Value Date/Time   TSH 0.934 07/19/2021 11:43 AM   TSH 1.150 05/07/2019 10:55 AM       Latest Ref Rng & Units 07/27/2022    9:32 AM 02/28/2022    9:02 AM 08/29/2021    1:28 AM  CBC  WBC 3.4 - 10.8 x10E3/uL 5.7  5.0    Hemoglobin 11.1 - 15.9 g/dL 11.4  11.6  12.9   Hematocrit 34.0 - 46.6 % 35.6  35.0  38.0   Platelets 150 - 450 x10E3/uL 227  242      Lab Results  Component Value Date/Time   VD25OH 59.2 02/13/2020 01:36 PM    Clinical ASCVD: Yes  The ASCVD Risk score (Arnett DK, et al., 2019) failed to calculate for the following reasons:   The patient has a prior MI or stroke diagnosis       07/27/2022    8:56 AM 02/23/2022    9:15 AM 02/17/2021   10:08 AM  Depression  screen PHQ 2/9  Decreased Interest 0 0 0  Down, Depressed, Hopeless 0 0 0  PHQ - 2 Score 0 0 0     Social History   Tobacco Use  Smoking Status Never  Smokeless Tobacco Never   BP Readings from Last 3 Encounters:  07/27/22 122/82  07/27/22 122/68  07/21/22 (!) 142/93   Pulse Readings from Last 3 Encounters:  07/27/22 77  07/27/22 83  07/21/22 73   Wt Readings from Last 3 Encounters:  07/27/22 159 lb 8 oz (72.3 kg)  07/27/22 157 lb 12.8 oz (71.6 kg)  07/21/22 156 lb 11.2 oz (71.1 kg)   BMI Readings from Last 3 Encounters:  07/27/22 26.54 kg/m  07/27/22 26.26 kg/m  07/21/22 25.92 kg/m    Assessment/Interventions: Review of patient past medical history, allergies, medications, health status, including review of consultants reports, laboratory and other test data, was performed as part of comprehensive evaluation and provision of chronic care management services.   SDOH:  (Social Determinants of Health) assessments and interventions performed: No SDOH Interventions    Flowsheet Row Chronic Care Management from 03/30/2021 in [place] [facility] from 02/24/2021 in [facility] Clinical Support from 02/13/2020 in [facility]  SDOH Interventions     Food Insecurity Interventions Intervention Not Indicated -- --  Housing Interventions Intervention Not Indicated -- --  Transportation Interventions Intervention Not Indicated -- --  Depression Interventions/Treatment  -- -- XBL3-9 Score <4 Follow-up Not Indicated  Financial Strain Interventions -- --  [patient assistance for freestyle libre] --      SDOH Screenings   Food Insecurity: No Food Insecurity (02/23/2022)  Housing: [place]  (03/30/2021)  Transportation Needs: No Transportation Needs (02/23/2022)  Alcohol Screen: Low Risk  (10/04/2021)  Depression (PHQ2-9): Low Risk  (07/27/2022)  Financial Resource Strain: Low Risk  (02/23/2022)   Physical Activity: Inactive (02/23/2022)  Stress: No Stress Concern Present (02/23/2022)  Tobacco Use: Low Risk  (08/25/2022)    [place]  No Known Allergies  Medications Reviewed Today     Reviewed by Mayford Knife, [place] (Pharmacist) on 09/14/22 at 1416  Med List Status: <None>   Medication Order Taking? Sig Documenting Provider Last Dose Status Informant  amoxicillin-clavulanate (AUGMENTIN) 500-125 MG tablet [id number]  Take one tab every 12 hours [name], MD  Active   atorvastatin (LIPITOR) 40 MG tablet [id number] No TAKE ONE TABLET BY MOUTH ON MONDAY, WEDNESDAY, AND Otis Dials,  Bailey Mech, MD Taking Active   Continuous Blood Gluc Receiver (FREESTYLE LIBRE 14 DAY READER) DEVI [id number] No 1 each by Does not apply route QID. [name], MD Taking Active   Continuous Blood Gluc Sensor (FREESTYLE LIBRE 14 DAY SENSOR) [place] [id number] No Use as directed to check blood sugars 4 times per day dx: e11.65 [name], MD Taking Active   insulin aspart (FIASP FLEXTOUCH) 100 UNIT/ML FlexTouch Pen [id number] No INJECT INTO SKIN SUBCUTANEOUS BEFORE MEALS PER SLIDING SCALE Minette Brine, FNP Taking Active   insulin detemir (LEVEMIR FLEXTOUCH) 100 UNIT/ML FlexPen [id number]  INJECT 22 UNITS SUBCUTANEOUSLY EVERY DAY AT BEDTIME AS DIRECTED -  MAXIMUM DOSE 60 UNITS - DISCARD OPENED PENS 42 DAYS AFTER FIRST USE,  DO NOT REFRIGERATE INDIVIDUAL PENS AFTER FIRST USE. [name], MD  Active   Insulin Pen Needle (PEN NEEDLES 3/16") 31G X 5 MM MISC [id number] No Use as directed, once daily. [name], MD Taking Active   Olmesartan-amLODIPine-HCTZ 40-5-25 MG TABS [id number] No TAKE ONE TABLET BY MOUTH EVERY DAY [name], MD Taking Active   Semaglutide,0.25 or 0.5MG/DOS, (OZEMPIC, 0.25 OR 0.5 MG/DOSE,) 2 MG/1.5ML SOPN [id number] No Inject 0.5 mg into the skin once a week. [name], MD Taking Active   tamoxifen (NOLVADEX) 20 MG tablet [id number] No Take 1 tablet (20 mg total) by mouth  daily. Nicholas Lose, MD Taking Active   Vitamin D, Cholecalciferol, 50 MCG (2000 UT) CAPS [id number] No Take 2,000 Units by mouth daily. [provider] Taking Active Self  Vitamin D, Ergocalciferol, (DRISDOL) 1.25 MG (50000 UNIT) CAPS capsule [id number] No  [provider] Taking Active             Patient Active Problem List   Diagnosis Date Noted   Posterior vitreous detachment of left eye 08/25/2022   Recurrent UTI 10/11/2021   Acute cystitis without hematuria 07/25/2021   Palpitations 07/25/2021   Posterior vitreous detachment of right eye 02/16/2021   Aortic atherosclerosis ([place]) 11/28/2020   Overweight (BMI 25.0-29.9) 11/28/2020   Moderate nonproliferative diabetic retinopathy of both eyes without macular edema associated with type 2 diabetes mellitus ([place]) 05/19/2020   Nuclear sclerotic cataract of both eyes 05/19/2020   Memory deficit 02/24/2020   Stroke (cerebrum) ([place]) 02/24/2020   AKI (acute kidney injury) ([place])    Thr[place]    Hyperglycemia 01/10/2020   Hypertension 01/10/2020   Acute lower UTI 01/10/2020   Malignant neoplasm of upper-outer quadrant of left breast in female, estrogen receptor positive ([place]) 12/16/2019   Ductal carcinoma in situ (DCIS) of left breast 07/17/2019   Type 2 diabetes mellitus with stage 3b chronic kidney disease, without long-term current use of insulin ([place]) 11/08/2018   Chronic renal disease, stage III ([employer]) 11/08/2018   Parenchymal renal hypertension 11/08/2018   Pure hypercholesterolemia 11/08/2018   Spider veins of both lower extremities 08/15/2017    Immunization History  Administered Date(s) Administered   DTaP 08/03/2007, 10/14/2014   Fluad Quad(high Dose 65+) 09/08/2020, 09/01/2021   Influenza, High Dose Seasonal PF 11/06/2019   PFIZER(Purple Top)SARS-COV-2 Vaccination 01/24/2020, 02/19/2020, 11/23/2020   Pfizer Covid-19 Vaccine Bivalent Booster 37yr & up 10/15/2021   Pneumococcal Conjugate-13 03/03/2014    Pneumococcal Polysaccharide-23 01/30/2016, 02/05/2019   Zoster, Live 03/16/2001    Conditions to be addressed/monitored:  Hypertension, Hyperlipidemia, and Diabetes  Care Plan : [place] Updates made by PMayford Knife [place] 09/14/2022 12:00 AM     Problem: HTN, DM II   Priority: High  Onset Date: 02/24/2021     Long-Range Goal: Disease Management   Start Date: 02/24/2021  Recent Progress: On track  Priority: High  Note:   Current Barriers:  Unable to independently afford treatment regimen Unable to independently monitor therapeutic efficacy  Pharmacist Clinical Goal(s):  Patient will achieve adherence to monitoring guidelines and medication adherence to achieve therapeutic efficacy through collaboration with PharmD and provider.   Interventions: 1:1 collaboration with [name], MD regarding development and update of comprehensive plan of care as evidenced by provider attestation and co-signature Inter-disciplinary care team collaboration (see longitudinal plan of care) Comprehensive medication review performed; medication list updated in electronic medical record  Hypertension (BP goal <130/80) -Controlled -Current treatment: Olmesartan-Amlodipine-HCTZ 40-5-25 mg taking one tablet by mouth once daily. Appropriate, Effective, Safe, Accessible -Current home readings: 120/80, 117/80  -Current dietary habits: please see diabetes -Current exercise habits: patient reports that she has been walking and she volunteers on Tuesdays  -Denies hypotensive/hypertensive symptoms -Educated on Importance of home blood pressure monitoring; -Counseled to monitor BP at home at least three times per week, document, and provide log at future appointments -Recommended to continue current medication  Diabetes (A1c goal <7%) -Not ideally controlled -Current medications: Levemir 100 unit/ml - inject 22 units daily  Appropriate, Effective, Safe, Accessible Ozempic  0.61m injection once per week. Appropriate, Effective, Safe, Accessible Fiasp inject per sliding scale Appropriate, Query effective -Current home glucose readings: she is currently checking her freestyle libre three times per day, readings have been around 140 -Denies hypoglycemic/hyperglycemic symptoms -Current meal patterns for yesterday: [place] and macaroni and cheese. Yesterday she had water and organic peach tea that she uses sugar -Current exercise: will discuss further during next office visit. -Educated on A1c and blood sugar goals; Complications of diabetes including kidney damage, retinal damage, and cardiovascular disease; Proper insulin injection technique; -Patient reported that she has not been taking the Fiasp because when she checks her BS some time after a meal she does not need it.  -We discussed how to take the Fiasp Continuous glucose monitoring; -Counseled to check feet daily and get yearly eye exams -Counseled on diet and exercise extensively  Patient Goals/Self-Care Activities Patient will:  - take medications as prescribed as evidenced by patient report and record review  Follow Up Plan: The patient has been provided with contact information for the care management team and has been advised to call with any health related questions or concerns.       Medication Assistance: None required.  Patient affirms current coverage meets needs.  Compliance/Adherence/Medication fill history: Care Gaps: Shingrix Vaccine COVID-19 Vaccine  Influenza vaccine  Star-Rating Drugs: Atorvastatin  40 mg  Ozempic 0.5 mg Olmesartan 40 mg   Patient's preferred pharmacy is:  [id number] [address] GaryNCCount[place] KnollsAEileen StanfordC [id number]: [phone]: [phone] MEDS-[facility]IL EAKossuthGAKidder[address]eWinifred Masterson [facility] [place] [address]hone: [phone]: [phone]aska[address]3546hone: [phone]: [phone] pill box? Yes Pt endorses 95% compliance  We discussed: Benefits of medication synchronization, packaging and delivery as well as enhanced pharmacist oversight with [employer]. Patient decided to: Continue current medication management strategy  Care Plan and Follow Up Patient Decision:  Patient agrees to Care Plan and Follow-up.  Plan: The patient has been provided with contact information for the care management team and  has been advised to call with any health related questions or concerns.   [name], CPP, PharmD Clinical Pharmacist Practitioner [facility] [phone]

## 2022-09-20 NOTE — Patient Instructions (Signed)
Visit Information It was great speaking with you today!  Please let me know if you have any questions about our visit.   Goals Addressed             This Visit's Progress    Manage My Medicine       Timeframe:  Long-Range Goal Priority:  High Start Date:                             Expected End Date:                       Follow Up Date 11/02/2022  In Progress:  - call for medicine refill 2 or 3 days before it runs out - keep a list of all the medicines I take; vitamins and herbals too - learn to read medicine labels - use a pillbox to sort medicine - use an alarm clock or phone to remind me to take my medicine    Why is this important?   These steps will help you keep on track with your medicines.  Notes: Please call with any questions you have.       Set My Target A1C-Diabetes Type 2       Timeframe:  Long-Range Goal Priority:  High Start Date:                             Expected End Date:                       Follow Up Date 11/03/2022/    - set target A1C goal <7   Why is this important?   Your target A1C is decided together by you and your doctor.  It is based on several things like your age and other health issues.    Notes:  -Recommend patient start checking BS at least 5 times per day using CGM device         Patient Care Plan: [place] Plan     Problem Identified: HTN, DM II   Priority: High  Onset Date: 02/24/2021     Long-Range Goal: Disease Management   Start Date: 02/24/2021  Recent Progress: On track  Priority: High  Note:   Current Barriers:  Unable to independently afford treatment regimen Unable to independently monitor therapeutic efficacy  Pharmacist Clinical Goal(s):  Patient will achieve adherence to monitoring guidelines and medication adherence to achieve therapeutic efficacy through collaboration with PharmD and provider.   Interventions: 1:1 collaboration with [name], MD regarding development and update  of comprehensive plan of care as evidenced by provider attestation and co-signature Inter-disciplinary care team collaboration (see longitudinal plan of care) Comprehensive medication review performed; medication list updated in electronic medical record  Hypertension (BP goal <130/80) -Controlled -Current treatment: Olmesartan-Amlodipine-HCTZ 40-5-25 mg taking one tablet by mouth once daily. Appropriate, Effective, Safe, Accessible -Current home readings: 120/80, 117/80  -Current dietary habits: please see diabetes -Current exercise habits: patient reports that she has been walking and she volunteers on Tuesdays  -Denies hypotensive/hypertensive symptoms -Educated on Importance of home blood pressure monitoring; -Counseled to monitor BP at home at least three times per week, document, and provide log at future appointments -Recommended to continue current medication  Diabetes (A1c goal <7%) -Not ideally controlled -Current medications: Levemir 100 unit/ml - inject 22 units daily  Appropriate, Effective,  Safe, Accessible Ozempic 0.'5mg'$  injection once per week. Appropriate, Effective, Safe, Accessible Fiasp inject per sliding scale Appropriate, Query effective -Current home glucose readings: she is currently checking her freestyle libre three times per day, readings have been around 140 -Denies hypoglycemic/hyperglycemic symptoms -Current meal patterns for yesterday: [place] wing and macaroni and cheese. Yesterday she had water and organic peach tea that she uses sugar -Current exercise: will discuss further during next office visit. -Educated on A1c and blood sugar goals; Complications of diabetes including kidney damage, retinal damage, and cardiovascular disease; Proper insulin injection technique; -Patient reported that she has not been taking the Fiasp because when she checks her BS some time after a meal she does not need it.  -We discussed how to take the Fiasp and how to check BS  prior to and after eating -She was able to repeat back these methods Continuous glucose monitoring; -Counseled to check feet daily and get yearly eye exams -Counseled on diet and exercise extensively  Patient Goals/Self-Care Activities Patient will:  - take medications as prescribed as evidenced by patient report and record review  Follow Up Plan: The patient has been provided with contact information for the care management team and has been advised to call with any health related questions or concerns.        Patient agreed to services and verbal consent obtained.   The patient verbalized understanding of instructions, educational materials, and care plan provided today and agreed to receive a mailed copy of patient instructions, educational materials, and care plan.   [name], PharmD Clinical Pharmacist [facility] [phone]

## 2022-10-31 ENCOUNTER — Telehealth: Payer: Self-pay

## 2022-10-31 NOTE — Progress Notes (Addendum)
    Called Nicola Police, No answer, left message of appointment on 11-02-2022 at 9:00 via office visit with [name], Pharm D. Notified to have all medications, supplements, blood pressure and/or blood sugar logs available during appointment and to return call if need to reschedule.  11-02-2022: Patient called to reschedule appointment today due to car issues. Patient also requested 90 DS refills for levemir, ozempic, and libre sensors to be sent to the [place]. Patient is taking 24 units of levemir nightly and is running out in about 28 days. Sent message to [place], Dr. Baird [facility] and [name].  Care Gaps: Shingrix overdue Covid booster overdue Flu vaccine overdue  Star Rating Drug: [facility]vastatin [facility]- [facility] Olmesartan/Amlodipine/HCTZ 40-5-25 mg-[facility] fills Ozempic 0.5 mg- [facility] fills   Any gaps in medications fill history? No  [place] Pharmacist Assistant [phone]

## 2022-11-02 ENCOUNTER — Ambulatory Visit: Payer: Self-pay

## 2022-11-02 ENCOUNTER — Other Ambulatory Visit: Payer: Self-pay

## 2022-11-02 DIAGNOSIS — N1832 Chronic kidney disease, stage 3b: Secondary | ICD-10-CM

## 2022-11-02 MED ORDER — OZEMPIC (0.25 OR 0.5 MG/DOSE) 2 MG/1.5ML ~~LOC~~ SOPN: 0.5000 mg | PEN_INJECTOR | SUBCUTANEOUS | 3 refills | Status: DC

## 2022-11-02 MED ORDER — FREESTYLE LIBRE 14 DAY SENSOR MISC: 11 refills | Status: DC

## 2022-11-02 MED ORDER — LEVEMIR FLEXTOUCH 100 UNIT/ML ~~LOC~~ SOPN: PEN_INJECTOR | SUBCUTANEOUS | 3 refills | Status: DC

## 2022-11-11 ENCOUNTER — Other Ambulatory Visit: Payer: Self-pay | Admitting: Internal Medicine

## 2022-11-14 ENCOUNTER — Telehealth: Payer: Self-pay

## 2022-11-14 NOTE — Chronic Care Management (AMB) (Signed)
  Nicola Police was reminded to have all medications, supplements and any blood glucose and blood pressure readings available for review with [name], Pharm. D, at her office visit on at 12:00.   Questions: Have you had any recent office visit or specialist visit outside of [place]? Patient stated no  Are there any concerns you would like to discuss during your office visit? Patient stated no  Are you having any problems obtaining your medications? (Whether it pharmacy issues or cost) Patient stated no  If patient has any PAP medications ask if they are having any problems getting their PAP medication or refill? No PAP meds  Care Gaps: Shingrix overdue Covid booster overdue Flu vaccine overdue   Star Rating Drug: Atorvastatin 40 mg- [facility] Olmesartan/Amlodipine/HCTZ 40-5-25 mg-[facility] fills Ozempic 0.5 mg- [facility] fills   Any gaps in medications fill history? No  [place] Pharmacist Assistant [phone]

## 2022-11-16 ENCOUNTER — Ambulatory Visit: Payer: Self-pay

## 2022-11-24 ENCOUNTER — Encounter (INDEPENDENT_AMBULATORY_CARE_PROVIDER_SITE_OTHER): Payer: Medicare PPO | Admitting: Ophthalmology

## 2022-12-02 ENCOUNTER — Other Ambulatory Visit: Payer: Self-pay | Admitting: Internal Medicine

## 2023-02-17 ENCOUNTER — Other Ambulatory Visit: Payer: Self-pay | Admitting: Internal Medicine

## 2023-02-20 ENCOUNTER — Other Ambulatory Visit: Payer: Self-pay | Admitting: Pharmacist

## 2023-02-20 ENCOUNTER — Other Ambulatory Visit: Payer: Self-pay

## 2023-02-20 MED ORDER — OZEMPIC (0.25 OR 0.5 MG/DOSE) 2 MG/3ML ~~LOC~~ SOPN: PEN_INJECTOR | SUBCUTANEOUS | 3 refills | Status: DC

## 2023-02-20 NOTE — Progress Notes (Signed)
Care Coordination Call  Contacted patient regarding upcoming continuation of Levemir. Discussed options including increasing dose of Ozempic; however, patient notes she does not want to lose anymore weight or have any more decrease in her appetite.   We also discussed potential for addition of SGLT2 given CKD. Discussed that she does have a history of UTIs, but long term renal benefits could be worth a trial of therapy, with discontinuation if development of genitourinary infections.   Patient would like to discuss with PCP at appointment on 03/09/23.   Recommend to d/c Levemir and start Tresiba at that time.   Catie Hedwig Morton, PharmD, [place], [facility] [phone]

## 2023-03-06 ENCOUNTER — Other Ambulatory Visit: Payer: Self-pay

## 2023-03-06 MED ORDER — OZEMPIC (0.25 OR 0.5 MG/DOSE) 2 MG/3ML ~~LOC~~ SOPN: PEN_INJECTOR | SUBCUTANEOUS | 3 refills | Status: DC

## 2023-03-09 ENCOUNTER — Ambulatory Visit: Payer: Medicare PPO | Admitting: Internal Medicine

## 2023-03-09 ENCOUNTER — Ambulatory Visit (INDEPENDENT_AMBULATORY_CARE_PROVIDER_SITE_OTHER): Payer: Medicare PPO

## 2023-03-09 VITALS — Ht 66.0 in | Wt 160.0 lb

## 2023-03-09 DIAGNOSIS — Z0000 Encounter for general adult medical examination without abnormal findings: Secondary | ICD-10-CM | POA: Diagnosis not present

## 2023-03-09 NOTE — Progress Notes (Signed)
I connected with  Tama Gander on 03/09/23 by a audio enabled telemedicine application and verified that I am speaking with the correct person using two identifiers.  Patient Location: Home  Provider Location: Office/Clinic  I discussed the limitations of evaluation and management by telemedicine. The patient expressed understanding and agreed to proceed.  Subjective:   Michele Wang is a 72 y.o. female who presents for Medicare Annual (Subsequent) preventive examination.  Review of Systems     Cardiac Risk Factors include: advanced age (>58men, >91 women);diabetes mellitus;hypertension     Objective:    Today's Vitals   03/09/23 0956  Weight: 160 lb (72.6 kg)  Height: 5\' 6"  (1.676 m)   Body mass index is 25.82 kg/m.     03/09/2023   10:02 AM 02/23/2022    9:14 AM 08/29/2021   12:35 AM 02/17/2021   10:07 AM 10/12/2020    3:01 PM 08/30/2020    4:08 PM 02/14/2020   10:35 AM  Advanced Directives  Does Patient Have a Medical Advance Directive? Yes Yes Yes Yes Yes No No  Type of Estate agent of [place];Living will Healthcare Power of [place];Living will Living will;Healthcare Power of [employer] Power of [place];Living will     Copy of Healthcare Power of Attorney in Chart? No - copy requested No - copy requested  No - copy requested     Would patient like information on creating a medical advance directive?       No - Patient declined    Current Medications (verified) Outpatient Encounter Medications as of 03/09/2023  Medication Sig   atorvastatin (LIPITOR) 40 MG tablet TAKE ONE TABLET BY MOUTH ON MONDAY, WEDNESDAY, AND FRIDAY   Continuous Blood Gluc Receiver (FREESTYLE LIBRE 14 DAY READER) DEVI 1 each by Does not apply route QID.   Continuous Blood Gluc Sensor (FREESTYLE LIBRE 14 DAY SENSOR) MISC Use as directed to check blood sugars 4 times per day dx: e11.65   insulin detemir (LEVEMIR FLEXTOUCH) 100 UNIT/ML FlexPen INJECT 24 UNITS  SUBCUTANEOUSLY EVERY DAY AT BEDTIME AS DIRECTED-MAXIMUM DOSE 60 UNITS-DISCARD OPENED PENS 42 DAYS AFTER FIRST USE, DO NOT REFRIGERATE INDIVIDUAL PENS AFTER FIRST USE.   Insulin Pen Needle (PEN NEEDLES 3/16") 31G X 5 MM MISC Use as directed, once daily.   Olmesartan-amLODIPine-HCTZ 40-5-25 MG TABS TAKE ONE TABLET BY MOUTH EVERY DAY   Semaglutide,0.25 or 0.5MG /DOS, (OZEMPIC, 0.25 OR 0.5 MG/DOSE,) 2 MG/3ML SOPN INJECT 0.5MG  SUBCUTANEOUSLY ONCE A WEEK - (ADMINISTER BY SUBCUTANEOUS INJECTION INTO THE ABDOMEN, THIGH, OR UPPER ARM AT ANY TIME OF DAY ON THE SAME DAY EACH WEEK) DISCARD PEN 56 DAYS AFTER FIRST USE   tamoxifen (NOLVADEX) 20 MG tablet Take 1 tablet (20 mg total) by mouth daily.   No facility-administered encounter medications on file as of 03/09/2023.    Allergies (verified) Patient has no known allergies.   History: Past Medical History:  Diagnosis Date   Breast cancer    CKD (chronic kidney disease)    Diabetes mellitus without complication    Type II   Hyperlipidemia    Hypertension    UTI (urinary tract infection)    Vitreomacular adhesion of left eye 05/19/2020   Past Surgical History:  Procedure Laterality Date   BREAST LUMPECTOMY WITH RADIOACTIVE SEED LOCALIZATION Left 08/21/2019   Procedure: LEFT BREAST LUMPECTOMY WITH RADIOACTIVE SEED LOCALIZATION;  Surgeon: Griselda Miner, MD;  Location: [facility];  Service: General;  Laterality: Left;   CESAREAN SECTION  x2   Family History  Problem Relation Age of Onset   Aneurysm Mother    Healthy Father    Social History   Socioeconomic History   Marital status: Married    Spouse name: Not on file   Number of children: Not on file   Years of education: Not on file   Highest education level: Not on file  Occupational History   Occupation: retired  Tobacco Use   Smoking status: Never   Smokeless tobacco: Never  Vaping Use   Vaping Use: Never used  Substance and Sexual Activity   Alcohol use: No    Drug use: No   Sexual activity: Yes  Other Topics Concern   Not on file  Social History Narrative   Not on file   Social Determinants of Health   Financial Resource Strain: Low Risk  (03/09/2023)   Overall Financial Resource Strain (CARDIA)    Difficulty of Paying Living Expenses: Not hard at all  Food Insecurity: No Food Insecurity (03/09/2023)   Hunger Vital Sign    Worried About Running Out of Food in the Last Year: Never true    Ran Out of Food in the Last Year: Never true  Transportation Needs: No Transportation Needs (03/09/2023)   PRAPARE - Administrator, Civil ServiceTransportation    Lack of Transportation (Medical): No    Lack of Transportation (Non-Medical): No  Physical Activity: Inactive (03/09/2023)   Exercise Vital Sign    Days of Exercise per Week: 0 days    Minutes of Exercise per Session: 0 min  Stress: No Stress Concern Present (02/23/2022)   [employer] - Occupational Stress Questionnaire    Feeling of Stress : Not at all  Social Connections: Not on file    Tobacco Counseling Counseling given: Not Answered   Clinical Intake:  Pre-visit preparation completed: Yes  Pain : No/denies pain     Nutritional Status: BMI 25 -29 Overweight Nutritional Risks: None Diabetes: Yes  How often do you need to have someone help you when you read instructions, pamphlets, or other written materials from your doctor or pharmacy?: 1 - Never  Diabetic? Yes Nutrition Risk Assessment:  Has the patient had any N/V/D within the last 2 months?  No  Does the patient have any non-healing wounds?  No  Has the patient had any unintentional weight loss or weight gain?  No   Diabetes:  Is the patient diabetic?  Yes  If diabetic, was a CBG obtained today?  No  Did the patient bring in their glucometer from home?  No  How often do you monitor your CBG's? Twice daily.   Financial Strains and Diabetes Management:  Are you having any financial strains with the device, your  supplies or your medication? No .  Does the patient want to be seen by Chronic Care Management for management of their diabetes?  No  Would the patient like to be referred to a Nutritionist or for Diabetic Management?  No   Diabetic Exams:  Diabetic Eye Exam: Completed 08/25/2022 Diabetic Foot Exam: Completed 07/27/2022   Interpreter Needed?: No  Information entered by :: NAllen LPN   Activities of Daily Living    03/09/2023   10:03 AM  In your present state of health, do you have any difficulty performing the following activities:  Hearing? 0  Vision? 0  Difficulty concentrating or making decisions? 0  Walking or climbing stairs? 0  Dressing or bathing? 0  Doing errands, shopping? 0  Preparing Food and eating ? N  Using the Toilet? N  In the past six months, have you accidently leaked urine? Y  Do you have problems with loss of bowel control? N  Managing your Medications? N  Managing your Finances? N  Housekeeping or managing your Housekeeping? N    Patient Care Team: Dorothyann Peng, MD as PCP - General (Internal Medicine) Dorothy Puffer, MD as Consulting Physician (Radiation Oncology) Griselda Miner, MD as Consulting Physician (General Surgery) Serena Croissant, MD as Consulting Physician (Hematology and Oncology) Luciana Axe Alford Highland, MD as Consulting Physician (Ophthalmology) Clarene Duke Karma Lew, RN as [facility] Alden Hipp, RPH-CPP (Pharmacist)  Indicate any recent Medical Services you may have received from other than [facility] providers in the past year (date may be approximate).     Assessment:   This is a routine wellness examination for [name].  Hearing/Vision screen Vision Screening - Comments:: Regular eye exams, [facility]  Dietary issues and exercise activities discussed: Current Exercise Habits: The patient does not participate in regular exercise at present   Goals Addressed             This Visit's Progress    Patient  Stated       03/09/2023, wants to maintain and keep blood sugars under control       Depression Screen    03/09/2023   10:02 AM 07/27/2022    8:56 AM 02/23/2022    9:15 AM 02/17/2021   10:08 AM 10/12/2020    3:00 PM 02/14/2020   10:35 AM 02/13/2020    9:06 AM  PHQ 2/9 Scores  PHQ - 2 Score 0 0 0 0 0 0 0  PHQ- 9 Score       0    Fall Risk    03/09/2023   10:02 AM 07/27/2022    8:56 AM 02/23/2022    9:15 AM 02/17/2021   10:08 AM 10/12/2020    3:00 PM  Fall Risk   Falls in the past year? 0 0 0 0 0  Number falls in past yr: 0 0     Injury with Fall? 0 0     Risk for fall due to : Medication side effect No Fall Risks Medication side effect Medication side effect   Follow up Falls prevention discussed;Education provided;Falls evaluation completed Falls evaluation completed Falls evaluation completed;Education provided;Falls prevention discussed Falls evaluation completed;Education provided;Falls prevention discussed     FALL RISK PREVENTION PERTAINING TO THE HOME:  Any stairs in or around the home? Yes  If so, are there any without handrails? Yes  Home free of loose throw rugs in walkways, pet beds, electrical cords, etc? Yes  Adequate lighting in your home to reduce risk of falls? Yes   ASSISTIVE DEVICES UTILIZED TO PREVENT FALLS:  Life alert? No  Use of a cane, walker or w/c? No  Grab bars in the bathroom? Yes  Shower chair or bench in shower? Yes  Elevated toilet seat or a handicapped toilet? Yes   TIMED UP AND GO:  Was the test performed? No .      Cognitive Function:        03/09/2023   10:03 AM 02/23/2022    9:16 AM 02/17/2021   10:09 AM 02/13/2020    9:07 AM 02/05/2019   11:22 AM  6CIT Screen  What Year? 0 points 0 points 0 points 0 points 0 points  What month? 0 points 0 points 0 points 0  points 0 points  What time? 0 points 0 points 0 points 0 points 0 points  Count back from 20 0 points 2 points 0 points 0 points 0 points  Months in reverse 0 points 4  points 0 points 0 points 0 points  Repeat phrase 0 points 4 points 0 points 2 points 0 points  Total Score 0 points 10 points 0 points 2 points 0 points    Immunizations Immunization History  Administered Date(s) Administered   DTaP 08/03/2007, 10/14/2014   Fluad Quad(high Dose 65+) 09/08/2020, 09/01/2021   Influenza, High Dose Seasonal PF 11/06/2019   PFIZER(Purple Top)SARS-COV-2 Vaccination 01/24/2020, 02/19/2020, 11/23/2020   Pfizer Covid-19 Vaccine Bivalent Booster 18yrs & up 10/15/2021   Pneumococcal Conjugate-13 03/03/2014   Pneumococcal Polysaccharide-23 01/30/2016, 02/05/2019   Zoster, Live 03/16/2001    TDAP status: Up to date  Flu Vaccine status: Up to date  Pneumococcal vaccine status: Up to date  Covid-19 vaccine status: Completed vaccines  Qualifies for Shingles Vaccine? Yes   Zostavax completed Yes   Shingrix Completed?: No.    Education has been provided regarding the importance of this vaccine. Patient has been advised to call insurance company to determine out of pocket expense if they have not yet received this vaccine. Advised may also receive vaccine at local pharmacy or Health Dept. Verbalized acceptance and understanding.  Screening Tests Health Maintenance  Topic Date Due   Zoster Vaccines- Shingrix (1 of 2) Never done   COVID-19 Vaccine (5 - 2023-24 season) 07/29/2022   HEMOGLOBIN A1C  01/26/2023   Medicare Annual Wellness (AWV)  02/24/2023   INFLUENZA VACCINE  06/29/2023   Diabetic kidney evaluation - eGFR measurement  07/28/2023   Diabetic kidney evaluation - Urine ACR  07/28/2023   FOOT EXAM  07/28/2023   MAMMOGRAM  08/05/2023   OPHTHALMOLOGY EXAM  08/26/2023   DTaP/Tdap/Td (3 - Tdap) 10/14/2024   COLONOSCOPY (Pts 45-46yrs Insurance coverage will need to be confirmed)  08/08/2029   Pneumonia Vaccine 44+ Years old  Completed   DEXA SCAN  Completed   Hepatitis C Screening  Completed   HPV VACCINES  Aged Out    Health Maintenance  Health  Maintenance Due  Topic Date Due   Zoster Vaccines- Shingrix (1 of 2) Never done   COVID-19 Vaccine (5 - 2023-24 season) 07/29/2022   HEMOGLOBIN A1C  01/26/2023   Medicare Annual Wellness (AWV)  02/24/2023    Colorectal cancer screening: Type of screening: Colonoscopy. Completed 08/09/2019. Repeat every 10 years  Mammogram status: Completed 08/04/2022. Repeat every year  Bone Density status: Completed 02/04/2020.   Lung Cancer Screening: (Low Dose CT Chest recommended if Age 27-80 years, 30 pack-year currently smoking OR have quit w/in 15years.) does not qualify.   Lung Cancer Screening Referral: no  Additional Screening:  Hepatitis C Screening: does qualify; Completed 01/01/2013  Vision Screening: Recommended annual ophthalmology exams for early detection of glaucoma and other disorders of the eye. Is the patient up to date with their annual eye exam?  Yes  Who is the provider or what is the name of the office in which the patient attends annual eye exams? [facility] If pt is not established with a provider, would they like to be referred to a provider to establish care? No .   Dental Screening: Recommended annual dental exams for proper oral hygiene  Community Resource Referral / Chronic Care Management: CRR required this visit?  No   CCM required this visit?  No  Plan:     I have personally reviewed and noted the following in the patient's chart:   Medical and social history Use of alcohol, tobacco or illicit drugs  Current medications and supplements including opioid prescriptions. Patient is not currently taking opioid prescriptions. Functional ability and status Nutritional status Physical activity Advanced directives List of other physicians Hospitalizations, surgeries, and ER visits in previous 12 months Vitals Screenings to include cognitive, depression, and falls Referrals and appointments  In addition, I have reviewed and discussed with patient certain  preventive protocols, quality metrics, and best practice recommendations. A written personalized care plan for preventive services as well as general preventive health recommendations were provided to patient.     Barb Merino, LPN   [id number]   Nurse Notes: none  Due to this being a virtual visit, the after visit summary with patients personalized plan was offered to patient via mail or my-chart. Patient would like to access on my-chart

## 2023-03-09 NOTE — Patient Instructions (Signed)
Michele Wang , Thank you for taking time to come for your Medicare Wellness Visit. I appreciate your ongoing commitment to your health goals. Please review the following plan we discussed and let me know if I can assist you in the future.   These are the goals we discussed:  Goals      DIET - DECREASE SODA OR JUICE INTAKE     DIET - REDUCE FAST FOOD INTAKE     Manage My Medicine     Timeframe:  Long-Range Goal Priority:  High Start Date:                             Expected End Date:                       Follow Up Date 11/02/2022  In Progress:  - call for medicine refill 2 or 3 days before it runs out - keep a list of all the medicines I take; vitamins and herbals too - learn to read medicine labels - use a pillbox to sort medicine - use an alarm clock or phone to remind me to take my medicine    Why is this important?   These steps will help you keep on track with your medicines.  Notes: Please call with any questions you have.       Patient Stated     02/13/2020, to get a routine for exercise and be more healthy     Patient Stated     02/17/2021, work on keeping blood sugar down     Patient Stated     02/23/2022, wants to start exercising     Patient Stated     03/09/2023, wants to maintain and keep blood sugars under control     Set My Target A1C-Diabetes Type 2     Timeframe:  Long-Range Goal Priority:  High Start Date:                             Expected End Date:                       Follow Up Date 11/03/2022/    - set target A1C goal <7   Why is this important?   Your target A1C is decided together by you and your doctor.  It is based on several things like your age and other health issues.    Notes:  -Recommend patient start checking BS at least 5 times per day using CGM device         This is a list of the screening recommended for you and due dates:  Health Maintenance  Topic Date Due   Zoster (Shingles) Vaccine (1 of 2) Never done   COVID-19  Vaccine (5 - 2023-24 season) 07/29/2022   Hemoglobin A1C  01/26/2023   Flu Shot  06/29/2023   Yearly kidney function blood test for diabetes  07/28/2023   Yearly kidney health urinalysis for diabetes  07/28/2023   Complete foot exam   07/28/2023   Mammogram  08/05/2023   Eye exam for diabetics  08/26/2023   Medicare Annual Wellness Visit  03/08/2024   DTaP/Tdap/Td vaccine (3 - Tdap) 10/14/2024   Colon Cancer Screening  08/08/2029   Pneumonia Vaccine  Completed   DEXA scan (bone density measurement)  Completed   Hepatitis C Screening:  USPSTF Recommendation to screen - Ages 74-79 yo.  Completed   HPV Vaccine  Aged Out    Advanced directives: Advance directive discussed with you today.   Conditions/risks identified: none  Next appointment: Follow up in one year for your annual wellness visit    Preventive Care 65 Years and Older, Female Preventive care refers to lifestyle choices and visits with your health care provider that can promote health and wellness. What does preventive care include? A yearly physical exam. This is also called an annual well check. Dental exams once or twice a year. Routine eye exams. Ask your health care provider how often you should have your eyes checked. Personal lifestyle choices, including: Daily care of your teeth and gums. Regular physical activity. Eating a healthy diet. Avoiding tobacco and drug use. Limiting alcohol use. Practicing safe sex. Taking low-dose aspirin every day. Taking vitamin and mineral supplements as recommended by your health care provider. What happens during an annual well check? The services and screenings done by your health care provider during your annual well check will depend on your age, overall health, lifestyle risk factors, and family history of disease. Counseling  Your health care provider may ask you questions about your: Alcohol use. Tobacco use. Drug use. Emotional well-being. Home and relationship  well-being. Sexual activity. Eating habits. History of falls. Memory and ability to understand (cognition). Work and work Astronomer. Reproductive health. Screening  You may have the following tests or measurements: Height, weight, and BMI. Blood pressure. Lipid and cholesterol levels. These may be checked every 5 years, or more frequently if you are over 108 years old. Skin check. Lung cancer screening. You may have this screening every year starting at age 44 if you have a 30-pack-year history of smoking and currently smoke or have quit within the past 15 years. Fecal occult blood test (FOBT) of the stool. You may have this test every year starting at age 79. Flexible sigmoidoscopy or colonoscopy. You may have a sigmoidoscopy every 5 years or a colonoscopy every 10 years starting at age 58. Hepatitis C blood test. Hepatitis B blood test. Sexually transmitted disease (STD) testing. Diabetes screening. This is done by checking your blood sugar (glucose) after you have not eaten for a while (fasting). You may have this done every 1-3 years. Bone density scan. This is done to screen for osteoporosis. You may have this done starting at age 52. Mammogram. This may be done every 1-2 years. Talk to your health care provider about how often you should have regular mammograms. Talk with your health care provider about your test results, treatment options, and if necessary, the need for more tests. Vaccines  Your health care provider may recommend certain vaccines, such as: Influenza vaccine. This is recommended every year. Tetanus, diphtheria, and acellular pertussis (Tdap, Td) vaccine. You may need a Td booster every 10 years. Zoster vaccine. You may need this after age 74. Pneumococcal 13-valent conjugate (PCV13) vaccine. One dose is recommended after age 56. Pneumococcal polysaccharide (PPSV23) vaccine. One dose is recommended after age 15. Talk to your health care provider about which  screenings and vaccines you need and how often you need them. This information is not intended to replace advice given to you by your health care provider. Make sure you discuss any questions you have with your health care provider. Document Released: 12/11/2015 Document Revised: 08/03/2016 Document Reviewed: 09/15/2015 Elsevier Interactive Patient Education  2017 [employer].  Fall Prevention in the Home Falls can cause  injuries. They can happen to people of all ages. There are many things you can do to make your home safe and to help prevent falls. What can I do on the outside of my home? Regularly fix the edges of walkways and driveways and fix any cracks. Remove anything that might make you trip as you walk through a door, such as a raised step or threshold. Trim any bushes or trees on the path to your home. Use bright outdoor lighting. Clear any walking paths of anything that might make someone trip, such as rocks or tools. Regularly check to see if handrails are loose or broken. Make sure that both sides of any steps have handrails. Any raised decks and porches should have guardrails on the edges. Have any leaves, snow, or ice cleared regularly. Use sand or salt on walking paths during winter. Clean up any spills in your garage right away. This includes oil or grease spills. What can I do in the bathroom? Use night lights. Install grab bars by the toilet and in the tub and shower. Do not use towel bars as grab bars. Use non-skid mats or decals in the tub or shower. If you need to sit down in the shower, use a plastic, non-slip stool. Keep the floor dry. Clean up any water that spills on the floor as soon as it happens. Remove soap buildup in the tub or shower regularly. Attach bath mats securely with double-sided non-slip rug tape. Do not have throw rugs and other things on the floor that can make you trip. What can I do in the bedroom? Use night lights. Make sure that you have a  light by your bed that is easy to reach. Do not use any sheets or blankets that are too big for your bed. They should not hang down onto the floor. Have a firm chair that has side arms. You can use this for support while you get dressed. Do not have throw rugs and other things on the floor that can make you trip. What can I do in the kitchen? Clean up any spills right away. Avoid walking on wet floors. Keep items that you use a lot in easy-to-reach places. If you need to reach something above you, use a strong step stool that has a grab bar. Keep electrical cords out of the way. Do not use floor polish or wax that makes floors slippery. If you must use wax, use non-skid floor wax. Do not have throw rugs and other things on the floor that can make you trip. What can I do with my stairs? Do not leave any items on the stairs. Make sure that there are handrails on both sides of the stairs and use them. Fix handrails that are broken or loose. Make sure that handrails are as long as the stairways. Check any carpeting to make sure that it is firmly attached to the stairs. Fix any carpet that is loose or worn. Avoid having throw rugs at the top or bottom of the stairs. If you do have throw rugs, attach them to the floor with carpet tape. Make sure that you have a light switch at the top of the stairs and the bottom of the stairs. If you do not have them, ask someone to add them for you. What else can I do to help prevent falls? Wear shoes that: Do not have high heels. Have rubber bottoms. Are comfortable and fit you well. Are closed at the toe. Do not  wear sandals. If you use a stepladder: Make sure that it is fully opened. Do not climb a closed stepladder. Make sure that both sides of the stepladder are locked into place. Ask someone to hold it for you, if possible. Clearly mark and make sure that you can see: Any grab bars or handrails. First and last steps. Where the edge of each step  is. Use tools that help you move around (mobility aids) if they are needed. These include: Canes. Walkers. Scooters. Crutches. Turn on the lights when you go into a dark area. Replace any light bulbs as soon as they burn out. Set up your furniture so you have a clear path. Avoid moving your furniture around. If any of your floors are uneven, fix them. If there are any pets around you, be aware of where they are. Review your medicines with your doctor. Some medicines can make you feel dizzy. This can increase your chance of falling. Ask your doctor what other things that you can do to help prevent falls. This information is not intended to replace advice given to you by your health care provider. Make sure you discuss any questions you have with your health care provider. Document Released: 09/10/2009 Document Revised: 04/21/2016 Document Reviewed: 12/19/2014 Elsevier Interactive Patient Education  2017 [employer]

## 2023-03-21 ENCOUNTER — Ambulatory Visit (INDEPENDENT_AMBULATORY_CARE_PROVIDER_SITE_OTHER): Payer: Medicare PPO | Admitting: Internal Medicine

## 2023-03-21 ENCOUNTER — Encounter: Payer: Self-pay | Admitting: Internal Medicine

## 2023-03-21 VITALS — BP 128/82 | HR 88 | Temp 98.4°F | Ht 66.0 in | Wt 157.2 lb

## 2023-03-21 DIAGNOSIS — E782 Mixed hyperlipidemia: Secondary | ICD-10-CM

## 2023-03-21 DIAGNOSIS — E1122 Type 2 diabetes mellitus with diabetic chronic kidney disease: Secondary | ICD-10-CM

## 2023-03-21 DIAGNOSIS — N1832 Chronic kidney disease, stage 3b: Secondary | ICD-10-CM | POA: Diagnosis not present

## 2023-03-21 DIAGNOSIS — E663 Overweight: Secondary | ICD-10-CM

## 2023-03-21 DIAGNOSIS — Z6825 Body mass index (BMI) 25.0-25.9, adult: Secondary | ICD-10-CM | POA: Diagnosis not present

## 2023-03-21 DIAGNOSIS — I1310 Hypertensive heart and chronic kidney disease without heart failure, with stage 1 through stage 4 chronic kidney disease, or unspecified chronic kidney disease: Secondary | ICD-10-CM | POA: Diagnosis not present

## 2023-03-21 LAB — POCT URINALYSIS DIPSTICK
Bilirubin, UA: NEGATIVE
Glucose, UA: NEGATIVE
Ketones, UA: NEGATIVE
Leukocytes, UA: NEGATIVE
Nitrite, UA: NEGATIVE
Protein, UA: NEGATIVE
Spec Grav, UA: 1.025 (ref 1.010–1.025)
Urobilinogen, UA: 0.2 E.U./dL
pH, UA: 5.5 (ref 5.0–8.0)

## 2023-03-21 MED ORDER — OLMESARTAN-AMLODIPINE-HCTZ 40-5-25 MG PO TABS
1.0000 | ORAL_TABLET | Freq: Every day | ORAL | 2 refills | Status: DC
Start: 2023-03-21 — End: 2024-02-29

## 2023-03-21 MED ORDER — OZEMPIC (0.25 OR 0.5 MG/DOSE) 2 MG/3ML ~~LOC~~ SOPN
PEN_INJECTOR | SUBCUTANEOUS | 3 refills | Status: DC
Start: 2023-03-21 — End: 2023-08-21

## 2023-03-21 NOTE — Patient Instructions (Signed)

## 2023-03-21 NOTE — Progress Notes (Signed)
I,Victoria T Hamilton,acting as a scribe for [name], MD.,have documented all relevant documentation on the behalf of [name], MD,as directed by  [name], MD while in the presence of [name], MD.    Subjective:     Patient ID: Michele Wang , female    DOB: 05-25-1951 , 72 y.o.   MRN: [id number]   Chief Complaint  Patient presents with   Diabetes   Hypertension   Hyperlipidemia    HPI  Pt presents today for diabetes check.  She recently had a visit with Santina Evans, pharmacist. She found out that Levemir has been discontinued and will need to switch to a new insulin.   Patient reports compliance with medications. Denies headache, chest pain, dizziness & SOB.   TransactRx rejected for shingles.    Diabetes She presents for her follow-up diabetic visit. She has type 2 diabetes mellitus. There are no hypoglycemic associated symptoms. There are no diabetic associated symptoms. Pertinent negatives for diabetes include no blurred vision and no chest pain. There are no hypoglycemic complications. Diabetic complications include nephropathy. She is following a diabetic diet. She participates in exercise intermittently.  Hypertension This is a chronic problem. The current episode started more than 1 year ago. The problem has been gradually improving since onset. The problem is controlled. Pertinent negatives include no blurred vision, chest pain, orthopnea, palpitations or shortness of breath. Risk factors for coronary artery disease include diabetes mellitus, dyslipidemia, post-menopausal state and sedentary lifestyle. The current treatment provides moderate improvement. Compliance problems include exercise.  Hypertensive end-organ damage includes kidney disease. Identifiable causes of hypertension include chronic renal disease.  Hyperlipidemia This is a chronic problem. The current episode started more than 1 year ago. Exacerbating diseases include chronic renal  disease and diabetes. Pertinent negatives include no chest pain or shortness of breath.     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     Family History  Problem Relation Age of Onset   Aneurysm Mother    Healthy Father      Current Outpatient Medications:    atorvastatin (LIPITOR) 40 MG tablet, TAKE ONE TABLET BY MOUTH ON MONDAY, WEDNESDAY, AND FRIDAY, Disp: 36 tablet, Rfl: 3   Continuous Blood Gluc Receiver (FREESTYLE LIBRE 14 DAY READER) DEVI, 1 each by Does not apply route QID., Disp: 2 each, Rfl: 2   Continuous Blood Gluc Sensor (FREESTYLE LIBRE 14 DAY SENSOR) MISC, Use as directed to check blood sugars 4 times per day dx: e11.65, Disp: 9 each, Rfl: 11   Insulin Pen Needle (PEN NEEDLES 3/16") 31G X 5 MM MISC, Use as directed, once daily., Disp: 90 each, Rfl: 2   tamoxifen (NOLVADEX) 20 MG tablet, Take 1 tablet (20 mg total) by mouth daily., Disp: 90 tablet, Rfl: 3   Olmesartan-amLODIPine-HCTZ 40-5-25 MG TABS, Take 1 tablet by mouth daily., Disp: 90 tablet, Rfl: 2   Semaglutide,0.25 or 0.5MG /DOS, (OZEMPIC, 0.25 OR 0.5 MG/DOSE,) 2 MG/3ML SOPN, INJECT 0.5MG  SUBCUTANEOUSLY ONCE A WEEK - (ADMINISTER BY SUBCUTANEOUS INJECTION INTO THE ABDOMEN, THIGH, OR UPPER ARM AT ANY TIME OF DAY ON THE SAME DAY EACH WEEK) DISCARD PEN 56 DAYS AFTER FIRST USE, Disp: 9 mL, Rfl: 3  No Known Allergies   Review of Systems  Constitutional: Negative.   Eyes:  Negative for blurred vision.  Respiratory: Negative.  Negative for shortness of breath.   Cardiovascular: Negative.  Negative for chest pain, palpitations and orthopnea.  Gastrointestinal: Negative.   Musculoskeletal: Negative.   Skin: Negative.   Neurological: Negative.   Psychiatric/Behavioral: Negative.       Today's  Vitals   03/21/23 1446  BP: 128/82  Pulse: 88  Temp: 98.4 F (36.9 C)  Weight: 157 lb 3.2 oz (71.3 kg)  Height: 5\' 6"  (1.676 m)   Body mass index is 25.37 kg/m.  Wt Readings from Last 3 Encounters:  03/21/23 157 lb 3.2 oz (71.3 kg)  03/09/23 160 lb (72.6 kg)  07/27/22 159 lb 8 oz (72.3 kg)    Objective:  Physical Exam Vitals and nursing note reviewed.  Constitutional:      Appearance: Normal appearance.  HENT:     Head: Normocephalic and atraumatic.  Eyes:     Extraocular Movements: Extraocular movements intact.  Cardiovascular:     Rate and Rhythm: Normal rate and regular rhythm.     Heart sounds: Normal heart sounds.  Pulmonary:     Effort: Pulmonary effort is normal.     Breath sounds: Normal breath sounds.  Musculoskeletal:     Cervical back: Normal range of motion.  Skin:    General: Skin is warm.  Neurological:     General: No focal deficit present.     Mental Status: She is alert.  Psychiatric:        Mood and Affect: Mood normal.        Behavior: Behavior normal.         Assessment And Plan:     1. Type 2 diabetes mellitus with stage 3b chronic kidney disease, without long-term current use of insulin (HCC) Comments: I will d/c Levemir, change to [place] 20 units nightly. Agrees to call next week to let me know how she is doing. - CMP14+EGFR - CBC - Hemoglobin A1c - Lipid panel - POCT Urinalysis Dipstick (81002) - Microalbumin / creatinine urine ratio - Semaglutide,0.25 or 0.5MG /DOS, (OZEMPIC, 0.25 OR 0.5 MG/DOSE,) 2 MG/3ML SOPN; INJECT 0.5MG  SUBCUTANEOUSLY ONCE A WEEK - (ADMINISTER BY SUBCUTANEOUS INJECTION INTO THE ABDOMEN, THIGH, OR UPPER ARM AT ANY TIME OF DAY ON THE SAME DAY EACH WEEK) DISCARD PEN 56 DAYS AFTER FIRST USE  Dispense: 9 mL; Refill: 3 - PTH, intact and calcium - Phosphorus - Protein electrophoresis, serum  2. Hypertensive heart and renal disease with renal failure, stage 1 through stage 4 or unspecified chronic kidney disease,  without heart failure - Olmesartan-amLODIPine-HCTZ 40-5-25 MG TABS; Take 1 tablet by mouth daily.  Dispense: 90 tablet; Refill: 2  3. Mixed hyperlipidemia Comments: Chronic, LDl goal. <  70.  She will c/w atorvastatin 40mg  w/ MWF dosing. She is unable to tolerate daily dosing due to myalgis.  4. Overweight with body mass index (BMI) of 25 to 25.9 in adult Comments: She is encouraged to aim for at least 150 minutes of exercise/week.     Patient was given opportunity to ask questions. Patient verbalized understanding of the plan and was able to repeat key elements of the plan. All questions were answered to their satisfaction.   I, [name], MD, have reviewed all documentation for this visit. The documentation on 03/21/23 for the exam, diagnosis, procedures, and orders are all accurate and complete.   IF YOU HAVE BEEN REFERRED TO A  SPECIALIST, IT MAY TAKE 1-2 WEEKS TO SCHEDULE/PROCESS THE REFERRAL. IF YOU HAVE NOT HEARD FROM US/SPECIALIST IN TWO WEEKS, PLEASE GIVE [place] A CALL AT [phone].   THE PATIENT IS ENCOURAGED TO PRACTICE SOCIAL DISTANCING DUE TO THE COVID-19 PANDEMIC.

## 2023-03-22 LAB — MICROALBUMIN / CREATININE URINE RATIO
Creatinine, Urine: 148.3 mg/dL
Microalb/Creat Ratio: 5 mg/g creat (ref 0–29)
Microalbumin, Urine: 8.1 ug/mL

## 2023-03-24 LAB — CMP14+EGFR
ALT: 21 IU/L (ref 0–32)
AST: 26 IU/L (ref 0–40)
Albumin/Globulin Ratio: 1.6 (ref 1.2–2.2)
Albumin: 4.4 g/dL (ref 3.8–4.8)
Alkaline Phosphatase: 70 IU/L (ref 44–121)
BUN/Creatinine Ratio: 14 (ref 12–28)
BUN: 22 mg/dL (ref 8–27)
Bilirubin Total: 0.3 mg/dL (ref 0.0–1.2)
CO2: 26 mmol/L (ref 20–29)
Calcium: 9.6 mg/dL (ref 8.7–10.3)
Chloride: 100 mmol/L (ref 96–106)
Creatinine, Ser: 1.61 mg/dL — ABNORMAL HIGH (ref 0.57–1.00)
Globulin, Total: 2.7 g/dL (ref 1.5–4.5)
Glucose: 148 mg/dL — ABNORMAL HIGH (ref 70–99)
Potassium: 3.9 mmol/L (ref 3.5–5.2)
Sodium: 139 mmol/L (ref 134–144)
Total Protein: 7.1 g/dL (ref 6.0–8.5)
eGFR: 34 mL/min/{1.73_m2} — ABNORMAL LOW (ref >59)

## 2023-03-24 LAB — LIPID PANEL
Chol/HDL Ratio: 2.4 ratio (ref 0.0–4.4)
Cholesterol, Total: 141 mg/dL (ref 100–199)
HDL: 60 mg/dL (ref >39)
LDL Chol Calc (NIH): 54 mg/dL (ref 0–99)
Triglycerides: 160 mg/dL — ABNORMAL HIGH (ref 0–149)
VLDL Cholesterol Cal: 27 mg/dL (ref 5–40)

## 2023-03-24 LAB — CBC
Hematocrit: 36.7 % (ref 34.0–46.6)
Hemoglobin: 11.9 g/dL (ref 11.1–15.9)
MCH: 29.2 pg (ref 26.6–33.0)
MCHC: 32.4 g/dL (ref 31.5–35.7)
MCV: 90 fL (ref 79–97)
Platelets: 237 10*3/uL (ref 150–450)
RBC: 4.08 x10E6/uL (ref 3.77–5.28)
RDW: 13.6 % (ref 11.7–15.4)
WBC: 5.2 10*3/uL (ref 3.4–10.8)

## 2023-03-24 LAB — PTH, INTACT AND CALCIUM: PTH: 87 pg/mL — ABNORMAL HIGH (ref 15–65)

## 2023-03-24 LAB — PROTEIN ELECTROPHORESIS, SERUM
A/G Ratio: 1.2 (ref 0.7–1.7)
Albumin ELP: 3.8 g/dL (ref 2.9–4.4)
Alpha 1: 0.2 g/dL (ref 0.0–0.4)
Alpha 2: 0.6 g/dL (ref 0.4–1.0)
Beta: 1 g/dL (ref 0.7–1.3)
Gamma Globulin: 1.5 g/dL (ref 0.4–1.8)
Globulin, Total: 3.3 g/dL (ref 2.2–3.9)

## 2023-03-24 LAB — PHOSPHORUS: Phosphorus: 3 mg/dL (ref 3.0–4.3)

## 2023-03-24 LAB — HEMOGLOBIN A1C
Est. average glucose Bld gHb Est-mCnc: 163 mg/dL
Hgb A1c MFr Bld: 7.3 % — ABNORMAL HIGH (ref 4.8–5.6)

## 2023-03-27 ENCOUNTER — Other Ambulatory Visit: Payer: Self-pay | Admitting: Internal Medicine

## 2023-03-27 DIAGNOSIS — E1122 Type 2 diabetes mellitus with diabetic chronic kidney disease: Secondary | ICD-10-CM

## 2023-03-27 DIAGNOSIS — I1310 Hypertensive heart and chronic kidney disease without heart failure, with stage 1 through stage 4 chronic kidney disease, or unspecified chronic kidney disease: Secondary | ICD-10-CM

## 2023-03-27 DIAGNOSIS — E782 Mixed hyperlipidemia: Secondary | ICD-10-CM

## 2023-04-11 ENCOUNTER — Telehealth: Payer: Self-pay

## 2023-04-11 NOTE — Progress Notes (Signed)
04-11-2023: Scheduled patient a follow up visit with Cherylin Mylar CPP.  Huey Romans [facility] Clinical Pharmacist Assistant [phone]

## 2023-04-17 ENCOUNTER — Telehealth: Payer: Self-pay

## 2023-04-17 NOTE — Progress Notes (Signed)
Care Management & Coordination Services Pharmacy Team  Reason for Encounter: Appointment Reminder  Contacted patient to confirm telephone appointment with Cherylin Mylar, PharmD on 04-19-2023 at 12:00. Spoke with patient on 04/17/2023   Do you have any problems getting your medications? No  What is your top health concern you would like to discuss at your upcoming visit? Patient stated no concerns  Have you seen any other providers since your last visit with PCP? No  Chart review: Recent office visits:  03-21-2023 Dorothyann Peng, MD. Follow up visit for HLD, HTN and DM. Stop levemir.  03-09-2023 Barb Merino, LPN. Medicare wellness visit.  Recent consult visits:  None  Hospital visits:  None in previous 6 months  Star Rating Drugs:  Atorvastatin 40 mg- Patient stated not too log ago. Unable to obtain last fill date from the [place] Ozempic 0.5 mg- Last filled 03-21-2023 [facility]/amlo/HCTZ 40-5-25 mg- Last [facility]   Care Gaps: Annual wellness visit in last year? Yes Shingrix overdue Covid booster overdue  If Diabetic: Last eye exam / retinopathy screening:06-21-2022 Last diabetic foot exam: None   Huey Romans CMA Clinical Pharmacist Assistant [phone]

## 2023-04-19 ENCOUNTER — Ambulatory Visit: Payer: Medicare PPO

## 2023-04-19 MED ORDER — INSULIN DEGLUDEC 100 UNIT/ML ~~LOC~~ SOPN: 20.0000 [IU] | PEN_INJECTOR | Freq: Every day | SUBCUTANEOUS | 3 refills | Status: AC

## 2023-04-19 NOTE — Progress Notes (Signed)
Care Management & Coordination Services Pharmacy Note  04/19/2023 Name:  Michele Wang MRN:  [id number] DOB:  1951-10-24  Summary: Patient reports that her new insulin needs to be sent to the [facility] pharmacy. She is not having any symptoms or issues.   Recommendations/Changes made from today's visit: Recommend Michele Wang's new insulin, tresiba is sent to the [place] to be refilled  Encourage Michele Wang to enroll in libreview program so we can closely monitor CGM glucose levels Patient to increase water intake for at least the next four weeks, to review possibility of changing patient to an SGLT-2, currently drinking less than 8 ounces of water per day  Follow up plan: Michele Wang is going to increase the amount of water she is drinking to 16 ounces of water per day for at least the next 2 weeks and her goal is to increase it to 32 ounces of water per day  [facility]   Subjective: Michele Wang is an 72 y.o. year old female who is a primary patient of Michele Peng, MD.  The care coordination team was consulted for assistance with disease management and care coordination needs.    Engaged with patient by telephone for follow up visit. Patient reports that she received a letter in  March from the [place] stating the [facility] no [facility] Levemir anymore.   Recent office visits:  03-21-2023 Michele Peng, MD. Follow up visit for HLD, HTN and DM. Stop levemir.   03-09-2023 Barb Merino, LPN. Medicare wellness visit.   Recent consult visits:  None   Hospital visits:  None in previous 6 months   Objective:  Lab Results  Component Value Date   CREATININE 1.61 (H) 03/21/2023   BUN 22 03/21/2023   EGFR 34 (L) 03/21/2023   GFRNONAA 26 (L) 08/29/2021   GFRAA 28 (L) 09/08/2020   NA 139 03/21/2023   K 3.9 03/21/2023   CALCIUM 9.6 03/21/2023   CO2 26 03/21/2023   GLUCOSE 148 (H) 03/21/2023    Lab Results  Component  Value Date/Time   HGBA1C 7.3 (H) 03/21/2023 03:47 PM   HGBA1C 7.8 (H) 07/27/2022 09:34 AM   MICROALBUR 30 07/19/2021 04:17 PM   MICROALBUR 30 02/17/2021 02:00 PM    Last diabetic Eye exam:  Lab Results  Component Value Date/Time   HMDIABEYEEXA Retinopathy (A) 08/25/2022 12:00 AM    Last diabetic Foot exam: No results found for: "HMDIABFOOTEX"   Lab Results  Component Value Date   CHOL 141 03/21/2023   HDL 60 03/21/2023   LDLCALC 54 03/21/2023   TRIG 160 (H) 03/21/2023   CHOLHDL 2.4 03/21/2023       Latest Ref Rng & Units 03/21/2023    3:47 PM 07/27/2022    9:34 AM 02/28/2022    9:02 AM  Hepatic Function  Total Protein 6.0 - 8.5 g/dL 7.1  7.2  7.3   Albumin 3.8 - 4.8 g/dL 4.4  4.2  4.3   AST 0 - 40 IU/L 26  17  21    ALT 0 - 32 IU/L 21  18  19    Alk Phosphatase 44 - 121 IU/L 70  68  62   Total Bilirubin 0.0 - 1.2 mg/dL 0.3  0.4  0.2     Lab Results  Component Value Date/Time   TSH 0.934 07/19/2021 11:43 AM   TSH 1.150 05/07/2019 10:55 AM  Latest Ref Rng & Units 03/21/2023    3:47 PM 07/27/2022    9:32 AM 02/28/2022    9:02 AM  CBC  WBC 3.4 - 10.8 x10E3/uL 5.2  5.7  5.0   Hemoglobin 11.1 - 15.9 g/dL 16.1  09.6  04.5   Hematocrit 34.0 - 46.6 % 36.7  35.6  35.0   Platelets 150 - 450 x10E3/uL 237  227  242     Lab Results  Component Value Date/Time   VD25OH 59.2 02/13/2020 01:36 PM    Clinical ASCVD: Yes  The ASCVD Risk score (Arnett DK, et al., 2019) failed to calculate for the following reasons:   The patient has a prior MI or stroke diagnosis        03/21/2023    2:46 PM 03/09/2023   10:02 AM 07/27/2022    8:56 AM  Depression screen PHQ 2/9  Decreased Interest 0 0 0  Down, Depressed, Hopeless 0 0 0  PHQ - 2 Score 0 0 0  Altered sleeping 0    Tired, decreased energy 0    Change in appetite 0    Feeling bad or failure about yourself  0    Trouble concentrating 0    Moving slowly or fidgety/restless 0    Suicidal thoughts 0    PHQ-9 Score 0     Difficult doing work/chores Not difficult at all       Social History   Tobacco Use  Smoking Status Never  Smokeless Tobacco Never   BP Readings from Last 3 Encounters:  03/21/23 128/82  07/27/22 122/82  07/27/22 122/68   Pulse Readings from Last 3 Encounters:  03/21/23 88  07/27/22 77  07/27/22 83   Wt Readings from Last 3 Encounters:  03/21/23 157 lb 3.2 oz (71.3 kg)  03/09/23 160 lb (72.6 kg)  07/27/22 159 lb 8 oz (72.3 kg)   BMI Readings from Last 3 Encounters:  03/21/23 25.37 kg/m  03/09/23 25.82 kg/m  07/27/22 26.54 kg/m    No Known Allergies  Medications Reviewed Today     Reviewed by Michele Peng, MD (Physician) on 03/21/23 at 1523  Med List Status: <None>   Medication Order Taking? Sig Documenting Provider Last Dose Status Informant  atorvastatin (LIPITOR) 40 MG tablet [id number] Yes TAKE ONE TABLET BY MOUTH ON MONDAY, WEDNESDAY, AND Drue Flirt, MD Taking Active   Continuous Blood Gluc Receiver (FREESTYLE LIBRE 14 DAY READER) DEVI [id number] Yes 1 each by Does not apply route QID. Michele Peng, MD Taking Active   Continuous Blood Gluc Sensor (FREESTYLE LIBRE [address] [place]) [place] [id number] Yes Use as directed to check blood sugars 4 times per day dx: e11.65 Michele Peng, MD Taking Active   insulin detemir (LEVEMIR FLEXTOUCH) 100 UNIT/ML FlexPen [id number] Yes INJECT 24 UNITS SUBCUTANEOUSLY EVERY DAY AT BEDTIME AS DIRECTED-MAXIMUM DOSE 60 UNITS-DISCARD OPENED PENS 42 DAYS AFTER FIRST USE, DO NOT REFRIGERATE INDIVIDUAL PENS AFTER FIRST USE. Michele Peng, MD Taking Active   Insulin Pen Needle (PEN NEEDLES 3/16") 31G X 5 MM MISC [id number] Yes Use as directed, once daily. Michele Peng, MD Taking Active   Olmesartan-amLODIPine-HCTZ 40-5-25 MG TABS [id number] Yes TAKE ONE TABLET BY MOUTH EVERY DAY Michele Peng, MD Taking Active   Semaglutide,0.25 or 0.5MG /DOS, (OZEMPIC, 0.25 OR 0.5 MG/DOSE,) 2 MG/3ML Namon Cirri [id number] Yes INJECT 0.5MG  SUBCUTANEOUSLY  ONCE A WEEK - (ADMINISTER BY SUBCUTANEOUS INJECTION INTO THE ABDOMEN, THIGH, OR UPPER ARM AT ANY TIME OF DAY ON THE SAME DAY EACH WEEK)  DISCARD PEN 56 DAYS AFTER FIRST USE Michele Peng, MD Taking Active   tamoxifen (NOLVADEX) 20 MG tablet [id number] Yes Take 1 tablet (20 mg total) by mouth daily. Serena Croissant, MD Taking Active             SDOH:  (Social Determinants of Health) assessments and interventions performed: No SDOH Interventions    Flowsheet Row Clinical Support from 03/09/2023 in [facility] Chronic Care Management from 03/30/2021 in [facility] Chronic Care Management from 02/24/2021 in [facility] Clinical Support from 02/13/2020 in [facility]  SDOH Interventions      Food Insecurity Interventions Intervention Not Indicated Intervention Not Indicated -- --  Housing Interventions Intervention Not Indicated Intervention Not Indicated -- --  Transportation Interventions Intervention Not Indicated Intervention Not Indicated -- --  Utilities Interventions Intervention Not Indicated -- -- --  Depression Interventions/Treatment  -- -- -- PHQ2-9 Score <4 Follow-up Not Indicated  Financial Strain Interventions Intervention Not Indicated -- --  [patient assistance for freestyle libre] --  Physical Activity Interventions Patient Refused, Other (Comments) -- -- --       Medication Assistance: None required.  Patient affirms current coverage meets needs.  Medication Access: Name and location of current pharmacy:  [id number] [place], [place] - [address] [place] [address] Phone: [phone] Fax: [phone]  [facility] - Kathrine Cords - [address] [facility] [address] [place] [address] [place] [address] Phone: [phone] Fax: [phone]  Gerri Spore LONG - [facility] [address] [place] [address] Phone: [phone] Fax: [phone]  Within the past 30 days, how often has patient missed a dose of medication? None    Compliance/Adherence/Medication fill history: Annual wellness visit in last year? Yes Shingrix overdue Covid booster overdue   If Diabetic: Last eye exam / retinopathy screening:06-21-2022 Last diabetic foot exam: None  Star-Rating Drugs: Atorvastatin 40 mg- Patient stated not too log ago. Unable to obtain last fill date from the [place] Ozempic 0.5 mg- [facility] Omlo/amlo/HCTZ 40-5-25 mg- [facility].    Assessment/Plan   Diabetes (A1c goal <8%) -Controlled -Current medications: Ozempic 0.5 mg once per week Appropriate, Effective, Safe, Accessible Patient reports that she is willing to stay on the medication longer  Insulin Degludec - injecting 20 units daily. Appropriate, Effective, Safe, Accessible Patient reports that she has not had any issues with how she feels  -Current home glucose readings:      - Ms. Agostino currently has a CGM device, but she is having trouble accessing the data because she is replacing her current sensor.  -Denies hypoglycemic/hyperglycemic symptoms -Current meal patterns:  breakfast: scrambled eggs, three strips of bacon and coffee   dinner: salad, one piece of fried chicken  drinks: water - half a glass maybe 8 ounces , pepsi - 8 ounces  -Current exercise: she is not currently exercising but she would like to start going on two days per week for at least twenty minutes and going on the treadmill.  -Educated on Exercise goal of 150 minutes per week; Continuous glucose monitoring; -Counseled to check feet daily and get yearly eye exams -Recommended to continue current medication Collaborated with patient to link her CGM data using libreview.   Collaborated with PCP to send Insulin Degludec to [facility] pharmacy per patient request   Cherylin Mylar, CPP,  PharmD Clinical Pharmacist Practitioner [facility] [phone]

## 2023-05-10 ENCOUNTER — Telehealth: Payer: Self-pay

## 2023-05-10 NOTE — Progress Notes (Cosign Needed)
Care Management & Coordination Services Pharmacy Team  Reason for Encounter: Appointment Reminder  Contacted patient to confirm telephone appointment with Cherylin Mylar, PharmD on 05-12-2023 at 10:45. Spoke with patient on 05/10/2023   Do you have any problems getting your medications? No  What is your top health concern you would like to discuss at your upcoming visit? Patient stated she has been feeling bad. She thinks the ozempic may be giving her diarrhea  Have you seen any other providers since your last visit with PCP? No   Chart review: Recent office visits:  None  Recent consult visits:  None  Hospital visits:  None in previous 6 months  Star Rating Drugs:  Atorvastatin 40 mg- Patient stated not too log ago. Unable to obtain last fill date from the [place] Ozempic 0.5 mg- Last filled [facility]-21-2023 [facility] Omlo/amlo/HCTZ 40-5-25 mg- Last filled [facility]-21-2023 [facility].    Care Gaps: Annual wellness visit in last year? Yes Shingrix overdue Covid booster overdue  If Diabetic: Last eye exam / retinopathy screening:06-21-2022  Last diabetic foot exam:None    Huey Romans [facility] Clinical Pharmacist Assistant [phone]

## 2023-05-29 ENCOUNTER — Other Ambulatory Visit: Payer: Medicare PPO | Admitting: Pharmacist

## 2023-05-29 NOTE — Patient Instructions (Signed)
Michele Wang,   It was great talking to you today!  Continue your current regimen. Talk to Dr. Allyne Gee about increasing your Ozempic dose (and decreasing the Tresiba dose) when you see her in August.   Scan your sensor at least 3 times daily so all of the data ends up on the [place] app.   Check your blood pressure periodically, and any time you have concerning symptoms like headache, chest pain, dizziness, shortness of breath, or vision changes.   Our goal is less than 130/80.  To appropriately check your blood pressure, make sure you do the following:  1) Avoid caffeine, exercise, or tobacco products for 30 minutes before checking. Empty your bladder. 2) Sit with your back supported in a flat-backed chair. Rest your arm on something flat (arm of the chair, table, etc). 3) Sit still with your feet flat on the floor, resting, for at least 5 minutes.  4) Check your blood pressure. Take 1-2 readings.  5) Write down these readings and bring with you to any provider appointments.  Bring your home blood pressure machine with you to a provider's office for accuracy comparison at least once a year.   Make sure you take your blood pressure medications before you come to any office visit, even if you were asked to fast for labs.   Take care!  Michele Wang, PharmD, BCACP, CPP Clinical Pharmacist [facility] [phone]

## 2023-05-29 NOTE — Progress Notes (Signed)
05/29/2023 Name: Michele Wang MRN: [id number] DOB: 11-09-1951  Chief Complaint  Patient presents with   Medication Management   Diabetes   Hypertension   Hyperlipidemia    Michele Wang is a 72 y.o. year old female who presented for a telephone visit.   They were referred to the pharmacist by their PCP for assistance in managing diabetes, hypertension, hyperlipidemia, and medication access.    Subjective:  Care Team: Primary Care Provider: Dorothyann Peng, MD ; Next Scheduled Visit: 8/24  Medication Access/Adherence  Current Pharmacy:  [id number] [place] [facility], [place] - [address] [place] [address] Phone: [phone] Fax: [phone]  [facility] - Kathrine Cords - [address] [facility] [address] [place] [address] [place] [address] Phone: [phone] Fax: [phone]  Gerri Spore LONG - [facility] [address] [place] [address] Phone: [phone] Fax: [phone]   Patient reports affordability concerns with their medications: No  Patient reports access/transportation concerns to their pharmacy: No  Patient reports adherence concerns with their medications:  No    [facility]  Diabetes:  Current medications: Tresiba 20 units daily, Ozempic 0.5 mg weekly,   Denies GI upset, constipation,   Date of Download: 05/16/23-05/29/23 % Time CGM is active: 6% Average Glucose: 119 mg/dL Glucose Management Indicator: n/a Glucose Variability: 26.1 (goal <36%) Time in Goal:  - Time in range 70-180: 95% - Time above range: 5% - Time below range: 0%  Hypertension:  Current medications: amlodipine/olmesartan/hydrochlorothiazide 40/5/25 mg   Patient has a validated, automated, upper arm home BP cuff Current blood pressure readings readings: 120-70-80  Patient denies hypotensive s/sx including dizziness, lightheadedness.  Patient denies hypertensive symptoms including  headache, chest pain, shortness of breath  Hyperlipidemia/ASCVD Risk Reduction  Current lipid lowering medications: atorvastatin 40 mg daily   Objective:  Lab Results  Component Value Date   HGBA1C 7.3 (H) 03/21/2023    Lab Results  Component Value Date   CREATININE 1.61 (H) 03/21/2023   BUN 22 03/21/2023   NA 139 03/21/2023   K 3.9 03/21/2023   CL 100 03/21/2023   CO2 26 03/21/2023    Lab Results  Component Value Date   CHOL 141 03/21/2023   HDL 60 03/21/2023   LDLCALC 54 03/21/2023   TRIG 160 (H) 03/21/2023   CHOLHDL 2.4 03/21/2023    Medications Reviewed Today     Reviewed by Alden Hipp, RPH-CPP (Pharmacist) on 05/29/23 at 1509  Med List Status: <None>   Medication Order Taking? Sig Documenting Provider Last Dose Status Informant  atorvastatin (LIPITOR) 40 MG tablet [id number] Yes TAKE ONE TABLET BY MOUTH ON MONDAY, WEDNESDAY, AND Drue Flirt, MD Taking Active   Continuous Blood Gluc Receiver (FREESTYLE LIBRE 14 DAY READER) DEVI [id number] Yes 1 each by Does not apply route QID. Dorothyann Peng, MD Taking Active   Continuous Blood Gluc Sensor (FREESTYLE LIBRE 14 DAY SENSOR) [place] [id number] Yes Use as directed to check blood sugars 4 times per day dx: e11.65 Dorothyann Peng, MD Taking Active   insulin degludec (TRESIBA) 100 UNIT/ML FlexTouch Pen [id number] Yes Inject 20 Units into the skin daily. Dorothyann Peng, MD Taking Active   Insulin Pen Needle (PEN NEEDLES 3/16") 31G X 5 MM MISC [id number]  Use as directed, once daily. Dorothyann Peng, MD  Active   Olmesartan-amLODIPine-HCTZ 40-5-25 MG TABS [id number] Yes Take 1 tablet by mouth daily. Dorothyann Peng, MD Taking Active   Semaglutide,0.25 or 0.5MG /DOS, (  OZEMPIC, 0.25 OR 0.5 MG/DOSE,) 2 MG/3ML SOPN [id number] Yes INJECT 0.5MG  SUBCUTANEOUSLY ONCE A WEEK - (ADMINISTER BY SUBCUTANEOUS INJECTION INTO THE ABDOMEN, THIGH, OR UPPER ARM AT ANY TIME OF DAY ON THE SAME DAY EACH WEEK) DISCARD PEN 56 DAYS AFTER FIRST USE  Dorothyann Peng, MD Taking Active   tamoxifen (NOLVADEX) 20 MG tablet [id number] Yes Take 1 tablet (20 mg total) by mouth daily. Serena Croissant, MD Taking Active               Assessment/Plan:   Diabetes: - Currently controlled - Reviewed long term cardiovascular and renal outcomes of uncontrolled blood sugar - Reviewed goal A1c, goal fasting, and goal 2 hour post prandial glucose - Recommend to consider Ozempic dose increase to allow for reduction/elimination of insulin. Patient is interested this, but would prefer to continue current supply of Ozempic 0.5 mg, as she has about 2 months left. Recommend to discuss increasing Ozempic to 1 mg to allow for reduction in [place] dose at next PCP appointment in 6 weeks - Recommend to scan Libre at least 3 times daily   Hypertension: - Currently controlled - Reviewed long term cardiovascular and renal outcomes of uncontrolled blood pressure - Reviewed appropriate blood pressure monitoring technique and reviewed goal blood pressure. Recommended to check home blood pressure and heart rate periodically  - Recommend to continue current regimen at this time   Hyperlipidemia/ASCVD Risk Reduction: - Currently controlled.  - Recommend to continue current regimen at this time  Follow Up Plan: PCP in 6 weeks, PharmD in ~ 10 weeks  Catie TClearance Coots, PharmD, BCACP, CPP Clinical Pharmacist [facility] [phone]

## 2023-05-30 ENCOUNTER — Encounter (INDEPENDENT_AMBULATORY_CARE_PROVIDER_SITE_OTHER): Payer: Medicare PPO | Admitting: Ophthalmology

## 2023-05-30 DIAGNOSIS — H2513 Age-related nuclear cataract, bilateral: Secondary | ICD-10-CM | POA: Diagnosis not present

## 2023-05-30 DIAGNOSIS — H43813 Vitreous degeneration, bilateral: Secondary | ICD-10-CM | POA: Diagnosis not present

## 2023-05-30 DIAGNOSIS — E113393 Type 2 diabetes mellitus with moderate nonproliferative diabetic retinopathy without macular edema, bilateral: Secondary | ICD-10-CM | POA: Diagnosis not present

## 2023-05-30 LAB — HM DIABETES EYE EXAM

## 2023-07-03 DIAGNOSIS — Z1231 Encounter for screening mammogram for malignant neoplasm of breast: Secondary | ICD-10-CM | POA: Diagnosis not present

## 2023-07-04 ENCOUNTER — Encounter: Payer: Self-pay | Admitting: Hematology and Oncology

## 2023-07-07 ENCOUNTER — Ambulatory Visit
Admission: EM | Admit: 2023-07-07 | Discharge: 2023-07-07 | Disposition: A | Discharge status: Home / Self Care | Payer: Medicare PPO | Source: Home / Self Care

## 2023-07-07 DIAGNOSIS — U071 COVID-19: Secondary | ICD-10-CM | POA: Insufficient documentation

## 2023-07-07 DIAGNOSIS — J069 Acute upper respiratory infection, unspecified: Secondary | ICD-10-CM | POA: Diagnosis not present

## 2023-07-07 DIAGNOSIS — J029 Acute pharyngitis, unspecified: Secondary | ICD-10-CM | POA: Diagnosis not present

## 2023-07-07 LAB — POCT RAPID STREP A (OFFICE): Rapid Strep A Screen: NEGATIVE

## 2023-07-07 MED ORDER — FLUTICASONE PROPIONATE 50 MCG/ACT NA SUSP: 1.0000 | Freq: Every day | NASAL | 0 refills | Status: DC

## 2023-07-07 MED ORDER — BENZONATATE 100 MG PO CAPS: 100.0000 mg | ORAL_CAPSULE | Freq: Three times a day (TID) | ORAL | 0 refills | Status: DC | PRN

## 2023-07-07 NOTE — Discharge Instructions (Signed)
Your rapid strep test was negative.  Throat culture and COVID tests are pending.  Will call if they are positive.  As we discussed, your symptoms appear viral in nature and should run its course.  I  have prescribed you 2 medications to alleviate symptoms.  Please follow-up if any symptoms persist or worsen.

## 2023-07-07 NOTE — ED Triage Notes (Signed)
Pt states cough,congestion, runny nose and sore throat for the past 3 days.

## 2023-07-07 NOTE — ED Provider Notes (Signed)
[facility]    CSN: [id number] Arrival date & time: 07/07/23  1761      History   Chief Complaint Chief Complaint  Patient presents with   Cough    HPI Michele Wang is a 72 y.o. female.   Patient presents with cough, nasal congestion, sore throat.  Reports cough and congestion started about 2 to 3 days ago but sore throat started yesterday.  Denies any fever or known sick contacts but does report that she was having some chills at the start of symptoms.  Denies chest pain, shortness of breath, gastrointestinal symptoms.  Patient denies history of asthma or COPD and does not smoke cigarettes.  Reports that she been taking over-the-counter Tylenol and ibuprofen with minimal improvement in symptoms.   Cough   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    Patient Active Problem List   Diagnosis Date Noted   Posterior vitreous detachment of left eye 08/25/2022   Recurrent UTI 10/11/2021   Acute cystitis without hematuria 07/25/2021   Palpitations 07/25/2021   Posterior vitreous detachment of right eye 02/16/2021   Aortic atherosclerosis (HCC) 11/28/2020   Overweight (BMI 25.0-29.9) 11/28/2020   Moderate nonproliferative diabetic retinopathy of both eyes without macular edema associated with type 2 diabetes mellitus (HCC) 05/19/2020   Nuclear sclerotic cataract of both eyes 05/19/2020   Memory deficit 02/24/2020   Stroke (cerebrum) (HCC) 02/24/2020   AKI (acute kidney injury) (HCC)    Thrush    Hyperglycemia 01/10/2020   Hypertension 01/10/2020   Acute lower UTI 01/10/2020   Malignant neoplasm of upper-outer quadrant of left breast in female, estrogen receptor positive (HCC) 12/16/2019   Ductal carcinoma in situ (DCIS) of left breast  07/17/2019   Type 2 diabetes mellitus with stage 3b chronic kidney disease, without long-term current use of insulin (HCC) 11/08/2018   Chronic renal disease, stage III (HCC) 11/08/2018   Parenchymal renal hypertension 11/08/2018   Pure hypercholesterolemia 11/08/2018   Spider veins of both lower extremities 08/15/2017    Past Surgical History:  Procedure Laterality Date   BREAST LUMPECTOMY WITH RADIOACTIVE SEED LOCALIZATION Left 08/21/2019   Procedure: LEFT BREAST LUMPECTOMY WITH RADIOACTIVE SEED LOCALIZATION;  Surgeon: Griselda Miner, MD;  Location: [facility];  Service: General;  Laterality: Left;   CESAREAN SECTION     x2    OB History   No obstetric history on file.      Home Medications    Prior to Admission medications   Medication Sig Start Date End Date Taking? Authorizing Provider  benzonatate (TESSALON) 100 MG capsule Take 1 capsule (100 mg total) by mouth every 8 (eight) hours as needed for cough. 07/07/23  Yes Oneal Biglow, Rolly Salter E, FNP  fluticasone (FLONASE) 50 MCG/ACT nasal spray Place 1 spray into both nostrils daily. 07/07/23  Yes Farren Landa, Rolly Salter E, FNP  atorvastatin (LIPITOR) 40 MG tablet TAKE ONE TABLET BY MOUTH ON [place], AND FRIDAY 12/02/22   Dorothyann Peng, MD  Continuous Blood Gluc Receiver (FREESTYLE LIBRE 14 DAY READER) DEVI 1 each by Does not apply route QID. 11/30/21   Dorothyann Peng, MD  Continuous Blood Gluc Sensor (FREESTYLE LIBRE 14 DAY SENSOR) MISC Use as directed to check  blood sugars 4 times per day dx: e11.65 11/02/22   Dorothyann Peng, MD  insulin degludec (TRESIBA) 100 UNIT/ML FlexTouch Pen Inject 20 Units into the skin daily. 04/19/23 08/17/23  Dorothyann Peng, MD  Insulin Pen Needle (PEN NEEDLES 3/16") 31G X 5 MM MISC Use as directed, once daily. 02/13/20   Dorothyann Peng, MD  Olmesartan-amLODIPine-HCTZ 40-5-25 MG TABS Take 1 tablet by mouth daily. 03/21/23   Dorothyann Peng, MD  Semaglutide,0.25 or 0.5MG /DOS, (OZEMPIC, 0.25 OR 0.5 MG/DOSE,) 2  MG/3ML SOPN INJECT 0.5MG  SUBCUTANEOUSLY ONCE A WEEK - (ADMINISTER BY SUBCUTANEOUS INJECTION INTO THE ABDOMEN, THIGH, OR UPPER ARM AT ANY TIME OF DAY ON THE SAME DAY EACH WEEK) DISCARD PEN 56 DAYS AFTER FIRST USE 03/21/23   Dorothyann Peng, MD  tamoxifen (NOLVADEX) 20 MG tablet Take 1 tablet (20 mg total) by mouth daily. 07/21/22   Serena Croissant, MD    Family History Family History  Problem Relation Age of Onset   Aneurysm Mother    Healthy Father     Social History Social History   Tobacco Use   Smoking status: Never   Smokeless tobacco: Never  Vaping Use   Vaping status: Never Used  Substance Use Topics   Alcohol use: No   Drug use: No     Allergies   Patient has no known allergies.   Review of Systems Review of Systems Per HPI  Physical Exam Triage Vital Signs ED Triage Vitals  Encounter Vitals Group     BP 07/07/23 0837 130/85     Systolic BP Percentile --      Diastolic BP Percentile --      Pulse Rate 07/07/23 0837 85     Resp 07/07/23 0837 16     Temp 07/07/23 0837 98 F (36.7 C)     Temp Source 07/07/23 0837 Oral     SpO2 07/07/23 0837 98 %     Weight --      Height --      Head Circumference --      Peak Flow --      Pain Score 07/07/23 0838 8     Pain Loc --      Pain Education --      Exclude from Growth Chart --    No data found.  Updated Vital Signs BP 130/85 (BP Location: Left Arm)   Pulse 85   Temp 98 F (36.7 C) (Oral)   Resp 16   SpO2 98%   Visual Acuity Right Eye Distance:   Left Eye Distance:   Bilateral Distance:    Right Eye Near:   Left Eye Near:    Bilateral Near:     Physical Exam Constitutional:      General: She is not in acute distress.    Appearance: Normal appearance.  HENT:     Head: Normocephalic and atraumatic.     Right Ear: Tympanic membrane and ear canal normal.     Left Ear: Tympanic membrane and ear canal normal.     Nose: Congestion present.     Mouth/Throat:     Mouth: Mucous membranes are moist.      Pharynx: Posterior oropharyngeal erythema present.  Eyes:     Extraocular Movements: Extraocular movements intact.     Conjunctiva/sclera: Conjunctivae normal.     Pupils: Pupils are equal, round, and reactive to light.  Cardiovascular:     Rate and Rhythm: Normal rate and regular rhythm.     Pulses: Normal pulses.  Heart sounds: Normal heart sounds.  Pulmonary:     Effort: Pulmonary effort is normal. No respiratory distress.     Breath sounds: Normal breath sounds. No stridor. No wheezing, rhonchi or rales.  Abdominal:     General: Abdomen is flat. Bowel sounds are normal.     Palpations: Abdomen is soft.  Musculoskeletal:        General: Normal range of motion.     Cervical back: Normal range of motion.  Skin:    General: Skin is warm and dry.  Neurological:     General: No focal deficit present.     Mental Status: She is alert and oriented to person, place, and time. Mental status is at baseline.  Psychiatric:        Mood and Affect: Mood normal.        Behavior: Behavior normal.      UC Treatments / Results  Labs (all labs ordered are listed, but only abnormal results are displayed) Labs Reviewed  CULTURE, GROUP A STREP ([facility])  SARS CORONAVIRUS 2 (TAT 6-24 HRS)  POCT RAPID STREP A (OFFICE)    EKG   Radiology No results found.  Procedures Procedures (including critical care time)  Medications Ordered in UC Medications - No data to display  Initial Impression / Assessment and Plan / UC Course  I have reviewed the triage vital signs and the nursing notes.  Pertinent labs & imaging results that were available during my care of the patient were reviewed by me and considered in my medical decision making (see chart for details).     Patient presents with symptoms likely from a viral upper respiratory infection.  Do not suspect underlying cardiopulmonary process. Symptoms seem unlikely related to ACS, CHF or COPD exacerbations, pneumonia, pneumothorax.  Patient is nontoxic appearing and not in need of emergent medical intervention.  Rapid strep was negative.  Throat culture and COVID test pending.  Recommended symptom control with medications and supportive care.  Patient was sent benzonatate and Flonase.  Return if symptoms fail to improve in 1-2 weeks or you develop shortness of breath, chest pain, severe headache. Patient states understanding and is agreeable.  Discharged with PCP followup.  Final Clinical Impressions(s) / UC Diagnoses   Final diagnoses:  Viral upper respiratory tract infection with cough  Sore throat     Discharge Instructions      Your rapid strep test was negative.  Throat culture and COVID tests are pending.  Will call if they are positive.  As we discussed, your symptoms appear viral in nature and should run its course.  I  have prescribed you 2 medications to alleviate symptoms.  Please follow-up if any symptoms persist or worsen.     ED Prescriptions     Medication Sig Dispense Auth. Provider   fluticasone (FLONASE) 50 MCG/ACT nasal spray Place 1 spray into both nostrils daily. 16 g Bader Stubblefield, Rolly Salter E, [place]   benzonatate (TESSALON) 100 MG capsule Take 1 capsule (100 mg total) by mouth every 8 (eight) hours as needed for cough. 21 capsule [employer], Acie Fredrickson, [place]      PDMP not reviewed this encounter.   Gustavus Bryant, [place] 07/07/23 [phone]

## 2023-07-18 ENCOUNTER — Encounter: Payer: Self-pay | Admitting: Internal Medicine

## 2023-07-18 ENCOUNTER — Ambulatory Visit (INDEPENDENT_AMBULATORY_CARE_PROVIDER_SITE_OTHER): Payer: Medicare PPO | Admitting: Internal Medicine

## 2023-07-18 VITALS — BP 128/82 | HR 77 | Temp 98.2°F | Ht 66.0 in | Wt 154.4 lb

## 2023-07-18 DIAGNOSIS — C50412 Malignant neoplasm of upper-outer quadrant of left female breast: Secondary | ICD-10-CM | POA: Diagnosis not present

## 2023-07-18 DIAGNOSIS — Z8616 Personal history of COVID-19: Secondary | ICD-10-CM | POA: Diagnosis not present

## 2023-07-18 DIAGNOSIS — E1122 Type 2 diabetes mellitus with diabetic chronic kidney disease: Secondary | ICD-10-CM

## 2023-07-18 DIAGNOSIS — I1310 Hypertensive heart and chronic kidney disease without heart failure, with stage 1 through stage 4 chronic kidney disease, or unspecified chronic kidney disease: Secondary | ICD-10-CM

## 2023-07-18 DIAGNOSIS — G9331 Postviral fatigue syndrome: Secondary | ICD-10-CM | POA: Diagnosis not present

## 2023-07-18 DIAGNOSIS — N1832 Chronic kidney disease, stage 3b: Secondary | ICD-10-CM

## 2023-07-18 DIAGNOSIS — Z170 Estrogen receptor positive status [ER+]: Secondary | ICD-10-CM

## 2023-07-18 DIAGNOSIS — I700 Atherosclerosis of aorta: Secondary | ICD-10-CM | POA: Diagnosis not present

## 2023-07-18 DIAGNOSIS — I10 Essential (primary) hypertension: Secondary | ICD-10-CM

## 2023-07-18 DIAGNOSIS — Z23 Encounter for immunization: Secondary | ICD-10-CM

## 2023-07-18 MED ORDER — CYANOCOBALAMIN 1000 MCG/ML IJ SOLN
1000.0000 ug | Freq: Once | INTRAMUSCULAR | Status: AC
Start: 2023-07-18 — End: 2023-07-18
  Administered 2023-07-18: 1000 ug via INTRAMUSCULAR

## 2023-07-18 NOTE — Patient Instructions (Signed)

## 2023-07-18 NOTE — Progress Notes (Unsigned)
I,[name], CMA,acting as a Neurosurgeon for [name], MD.,have documented all relevant documentation on the behalf of [name], MD,as directed by  [name], MD while in the presence of [name], MD.  Subjective:  Patient ID: Michele Wang , female    DOB: 07/26/1951 , 72 y.o.   MRN: [id number]  Chief Complaint  Patient presents with   Diabetes   Hypertension    HPI  Pt presents today for diabetes & bpc. She reports compliance with medications. Denies headache, chest pain, and SOB. She states she was diagnosed with COVID on 07/07/23. She had gone to UC for further evaluation of a cold. She is not sure how she was exposed. She denies known ill contacts.   Today she reports she is still experiencing a cough. Denies fever & runny nose.   Diabetes She presents for her follow-up diabetic visit. She has type 2 diabetes mellitus. There are no hypoglycemic associated symptoms. There are no diabetic associated symptoms. Pertinent negatives for diabetes include no blurred vision and no chest pain. There are no hypoglycemic complications. Diabetic complications include nephropathy. She is following a diabetic diet. She participates in exercise intermittently.  Hypertension This is a chronic problem. The current episode started more than 1 year ago. The problem has been gradually improving since onset. The problem is controlled. Pertinent negatives include no blurred vision, chest pain, orthopnea, palpitations or shortness of breath. Risk factors for coronary artery disease include diabetes mellitus, dyslipidemia, post-menopausal state and sedentary lifestyle. The current treatment provides moderate improvement. Compliance problems include exercise.  Hypertensive end-organ damage includes kidney disease. Identifiable causes of hypertension include chronic renal disease.  Hyperlipidemia This is a chronic problem. The current episode started more than 1 year ago. Exacerbating  diseases include chronic renal disease and diabetes. Pertinent negatives include no chest pain or shortness of breath.     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     Family History  Problem Relation Age of Onset   Aneurysm Mother    Healthy Father      Current Outpatient Medications:    atorvastatin (LIPITOR) 40 MG tablet, TAKE ONE TABLET BY MOUTH ON MONDAY, WEDNESDAY, AND FRIDAY, Disp: 36 tablet, Rfl: 3   benzonatate (TESSALON) 100 MG capsule, Take 1 capsule (100 mg total) by mouth every 8 (eight) hours as needed for cough., Disp: 21 capsule, Rfl: 0   Continuous Blood Gluc Receiver (FREESTYLE LIBRE 14 DAY READER) DEVI, 1 each by Does not apply route QID., Disp: 2 each, Rfl: 2   Continuous Blood Gluc Sensor (FREESTYLE LIBRE 14 DAY SENSOR) MISC, Use as directed to check blood sugars 4 times per day dx: e11.65, Disp: 9 each, Rfl: 11   fluticasone (FLONASE) 50 MCG/ACT nasal spray, Place 1 spray into both nostrils daily., Disp: 16 g, Rfl: 0   insulin degludec (TRESIBA) 100 UNIT/ML FlexTouch Pen, Inject 20 Units into the skin daily., Disp: 6 mL, Rfl: 3   Insulin Pen Needle (PEN NEEDLES 3/16") 31G X 5 MM MISC, Use as directed, once daily., Disp: 90 each, Rfl: 2   Olmesartan-amLODIPine-HCTZ 40-5-25 MG TABS, Take 1 tablet by mouth daily., Disp: 90 tablet, Rfl: 2  Semaglutide,0.25 or 0.5MG /DOS, (OZEMPIC, 0.25 OR 0.5 MG/DOSE,) 2 MG/3ML SOPN, INJECT 0.5MG  SUBCUTANEOUSLY ONCE A WEEK - (ADMINISTER BY SUBCUTANEOUS INJECTION INTO THE ABDOMEN, THIGH, OR UPPER ARM AT ANY TIME OF DAY ON THE SAME DAY EACH WEEK) DISCARD PEN 56 DAYS AFTER FIRST USE, Disp: 9 mL, Rfl: 3   tamoxifen (NOLVADEX) 20 MG tablet, Take 1 tablet (20 mg total) by mouth daily., Disp: 90 tablet, Rfl: 3   No  Known Allergies   Review of Systems  Constitutional: Negative.   Eyes:  Negative for blurred vision.  Respiratory: Negative.  Negative for shortness of breath.   Cardiovascular: Negative.  Negative for chest pain, palpitations and orthopnea.  Gastrointestinal: Negative.   Musculoskeletal: Negative.   Neurological: Negative.   Psychiatric/Behavioral: Negative.       Today's Vitals   07/18/23 1017  BP: 128/82  Pulse: 77  Temp: 98.2 F (36.8 C)  SpO2: 99%  Weight: 154 lb 6.4 oz (70 kg)  Height: 5\' 6"  (1.676 m)   Body mass index is 24.92 kg/m.  Wt Readings from Last 3 Encounters:  07/24/23 157 lb 8 oz (71.4 kg)  07/18/23 154 lb 6.4 oz (70 kg)  03/21/23 157 lb 3.2 oz (71.3 kg)     Objective:  Physical Exam Vitals and nursing note reviewed.  Constitutional:      Appearance: Normal appearance.  HENT:     Head: Normocephalic and atraumatic.  Eyes:     Extraocular Movements: Extraocular movements intact.  Cardiovascular:     Rate and Rhythm: Normal rate and regular rhythm.     Heart sounds: Normal heart sounds.  Pulmonary:     Effort: Pulmonary effort is normal.     Breath sounds: Normal breath sounds.  Musculoskeletal:     Cervical back: Normal range of motion.  Skin:    General: Skin is warm.  Neurological:     General: No focal deficit present.     Mental Status: She is alert.  Psychiatric:        Mood and Affect: Mood normal.        Behavior: Behavior normal.         Assessment And Plan:  Type 2 diabetes mellitus with stage 3b chronic kidney disease, without long-term current use of insulin (HCC) Assessment & Plan: Chronic, Chronic, she is encouraged to stay well hydrated, avoid NSAIDs and keep BP controlled to prevent progression of CKD.  Importance of dietary compliance was discussed with the patient. She will continue with semaglutide 0.5mg  weekly, Tresiba 20 units sq nightly. She is encouraged to send fasting BS weekly so I can adjust her dose as needed.  I will check labs as below.   Orders: -     CMP14+EGFR -     Hemoglobin A1c -     TSH  Hypertensive heart and renal disease with renal failure, stage 1 through stage 4 or unspecified chronic kidney disease, without heart failure Assessment & Plan: Chronic, fair control. Goal BP<120/80.  She will continue with olmesartan/amlodipine/hct 40/5/25mg  daily. She is reminded to follow low sodium diet.    Aortic atherosclerosis (HCC) Assessment & Plan: Chronic, LDL goal is less than 70.  She will continue with atorvastatin 40mg  MWF. She is encouraged to follow a heart healthy lifestyle.    Postviral fatigue syndrome Assessment & Plan: She was given vitamin B12 IM x 1.   Orders: -     Cyanocobalamin  Malignant neoplasm of upper-outer quadrant of left breast in female, estrogen receptor  positive [facility]) Assessment & Plan: Initially diagnosed in 2020.  She is s/p lumpectomy of left breast and XRT. She will continue with tamoxifen for five years.    Personal history of COVID-19     Return MOVE PHYS TO 11/6 AT 820.  Patient was given opportunity to ask questions. Patient verbalized understanding of the plan and was able to repeat key elements of the plan. All questions were answered to their satisfaction.   I, [name], MD, have reviewed all documentation for this visit. The documentation on 07/18/23 for the exam, diagnosis, procedures, and orders are all accurate and complete.   IF YOU HAVE BEEN REFERRED TO A SPECIALIST, IT MAY TAKE 1-2 WEEKS TO SCHEDULE/PROCESS THE REFERRAL. IF YOU HAVE NOT HEARD FROM US/SPECIALIST IN TWO WEEKS, PLEASE GIVE [place] A CALL AT [phone].   THE PATIENT IS ENCOURAGED TO PRACTICE SOCIAL DISTANCING DUE TO THE COVID-19 PANDEMIC.

## 2023-07-19 LAB — CMP14+EGFR
ALT: 20 IU/L (ref 0–32)
AST: 29 IU/L (ref 0–40)
Albumin: 4.3 g/dL (ref 3.8–4.8)
Alkaline Phosphatase: 62 IU/L (ref 44–121)
BUN/Creatinine Ratio: 14 (ref 12–28)
BUN: 21 mg/dL (ref 8–27)
Bilirubin Total: 0.5 mg/dL (ref 0.0–1.2)
CO2: 23 mmol/L (ref 20–29)
Calcium: 9.7 mg/dL (ref 8.7–10.3)
Chloride: 103 mmol/L (ref 96–106)
Creatinine, Ser: 1.46 mg/dL — ABNORMAL HIGH (ref 0.57–1.00)
Globulin, Total: 2.9 g/dL (ref 1.5–4.5)
Glucose: 105 mg/dL — ABNORMAL HIGH (ref 70–99)
Potassium: 4.3 mmol/L (ref 3.5–5.2)
Sodium: 141 mmol/L (ref 134–144)
Total Protein: 7.2 g/dL (ref 6.0–8.5)
eGFR: 38 mL/min/{1.73_m2} — ABNORMAL LOW (ref >59)

## 2023-07-19 LAB — TSH: TSH: 1.17 u[IU]/mL (ref 0.450–4.500)

## 2023-07-19 LAB — HEMOGLOBIN A1C
Est. average glucose Bld gHb Est-mCnc: 166 mg/dL
Hgb A1c MFr Bld: 7.4 % — ABNORMAL HIGH (ref 4.8–5.6)

## 2023-07-21 NOTE — Progress Notes (Signed)
Patient Care Team: Dorothyann Peng, MD as PCP - General (Internal Medicine) Dorothy Puffer, MD as Consulting Physician (Radiation Oncology) Griselda Miner, MD as Consulting Physician (General Surgery) Serena Croissant, MD as Consulting Physician (Hematology and Oncology) Luciana Axe Alford Highland, MD as Consulting Physician (Ophthalmology) Clarene Duke Karma Lew, RN as [facility] Alden Hipp, RPH-CPP (Pharmacist)  DIAGNOSIS: No diagnosis found.  SUMMARY OF ONCOLOGIC HISTORY: Oncology History  Ductal carcinoma in situ (DCIS) of left breast  07/10/2019 Cancer Staging   Staging form: Breast, AJCC 8th Edition - Clinical stage from 07/10/2019: Stage 0 (cTis (DCIS), cN0, cM0, ER+, PR+)    07/17/2019 Initial Diagnosis   Routine screening mammogram detected a 0.6cm area of calcifications in the upper outer left breast. Biopsy showed DCIS with calcifications, intermediate grade, ER+100%, PR+ 100% positive.   08/21/2019 Surgery   Left lumpectomy Carolynne Edouard) [phone]): low grade DCIS spanning 0.4cm, clear margins, and no invasive carcinoma   08/21/2019 Cancer Staging   Staging form: Breast, AJCC 8th Edition - Pathologic stage from 08/21/2019: Stage 0 (pTis (DCIS), pN0, cM0)    09/16/2019 - 10/11/2019 Radiation Therapy   The patient initially received a dose of 42.56 Gy in 16 fractions to the breast using whole-breast tangent fields. This was delivered using a 3-D conformal technique. The patient then received a boost to the seroma. This delivered an additional 8 Gy in 15fractions using a 3 field photon technique due to the depth of the seroma. The total dose was 50.56 Gy.   09/2019 - 09/2024 Anti-estrogen oral therapy   Tamoxifen     CHIEF COMPLIANT:   INTERVAL HISTORY: Michele Wang is a   ALLERGIES:  has No Known Allergies.  MEDICATIONS:  Current Outpatient Medications  Medication Sig Dispense Refill   atorvastatin (LIPITOR) 40 MG tablet TAKE ONE TABLET BY MOUTH ON  MONDAY, WEDNESDAY, AND FRIDAY 36 tablet 3   benzonatate (TESSALON) 100 MG capsule Take 1 capsule (100 mg total) by mouth every 8 (eight) hours as needed for cough. 21 capsule 0   Continuous Blood Gluc Receiver (FREESTYLE LIBRE 14 DAY READER) DEVI 1 each by Does not apply route QID. 2 each 2   Continuous Blood Gluc Sensor (FREESTYLE LIBRE 14 DAY SENSOR) MISC Use as directed to check blood sugars 4 times per day dx: e11.65 9 each 11   fluticasone (FLONASE) 50 MCG/ACT nasal spray Place 1 spray into both nostrils daily. 16 g 0   insulin degludec (TRESIBA) 100 UNIT/ML FlexTouch Pen Inject 20 Units into the skin daily. 6 mL 3   Insulin Pen Needle (PEN NEEDLES 3/16") 31G X 5 MM MISC Use as directed, once daily. 90 each 2   Olmesartan-amLODIPine-HCTZ 40-5-25 MG TABS Take 1 tablet by mouth daily. 90 tablet 2   Semaglutide,0.25 or 0.5MG /DOS, (OZEMPIC, 0.25 OR 0.5 MG/DOSE,) 2 MG/3ML SOPN INJECT 0.5MG  SUBCUTANEOUSLY ONCE A WEEK - (ADMINISTER BY SUBCUTANEOUS INJECTION INTO THE ABDOMEN, THIGH, OR UPPER ARM AT ANY TIME OF DAY ON THE SAME DAY EACH WEEK) DISCARD PEN 56 DAYS AFTER FIRST USE 9 mL 3   tamoxifen (NOLVADEX) 20 MG tablet Take 1 tablet (20 mg total) by mouth daily. 90 tablet 3   No current facility-administered medications for this visit.    PHYSICAL EXAMINATION: ECOG PERFORMANCE STATUS: {CHL ONC ECOG PS:[id number]}  There were no vitals filed for this visit. There were no vitals filed for this visit.  BREAST:*** No palpable masses or nodules in either right or left breasts. No palpable  axillary supraclavicular or infraclavicular adenopathy no breast tenderness or nipple discharge. (exam performed in the presence of a chaperone)  LABORATORY DATA:  I have reviewed the data as listed    Latest Ref Rng & Units 07/18/2023   11:04 AM 03/21/2023    3:47 PM 07/27/2022    9:34 AM  CMP  Glucose 70 - 99 mg/dL 272  536  644   BUN 8 - 27 mg/dL 21  22  18    Creatinine 0.57 - 1.00 mg/dL 0.34  7.42  5.95    Sodium 134 - 144 mmol/L 141  139  143   Potassium 3.5 - 5.2 mmol/L 4.3  3.9  4.1   Chloride 96 - 106 mmol/L 103  100  102   CO2 20 - 29 mmol/L 23  26  22    Calcium 8.7 - 10.3 mg/dL 9.7  9.6  9.7   Total Protein 6.0 - 8.5 g/dL 7.2  7.1  7.2   Total Bilirubin 0.0 - 1.2 mg/dL 0.5  0.3  0.4   Alkaline Phos 44 - 121 IU/L 62  70  68   AST 0 - 40 IU/L 29  26  17    ALT 0 - 32 IU/L 20  21  18      Lab Results  Component Value Date   WBC 5.2 03/21/2023   HGB 11.9 03/21/2023   HCT 36.7 03/21/2023   MCV 90 03/21/2023   PLT 237 03/21/2023   NEUTROABS 2.9 08/29/2021    ASSESSMENT & PLAN:  No problem-specific Assessment & Plan notes found for this encounter.    No orders of the defined types were placed in this encounter.  The patient has a good understanding of the overall plan. she agrees with it. she will call with any problems that may develop before the next visit here. Total time spent: 30 mins including face to face time and time spent for planning, charting and co-ordination of care   Sherlyn Lick, CMA 07/21/23    I Janan Ridge am acting as a Neurosurgeon for [employer]  ***

## 2023-07-24 ENCOUNTER — Inpatient Hospital Stay: Payer: Medicare PPO | Attending: Hematology and Oncology | Admitting: Hematology and Oncology

## 2023-07-24 ENCOUNTER — Ambulatory Visit: Payer: Medicare PPO | Admitting: Hematology and Oncology

## 2023-07-24 VITALS — BP 142/73 | HR 82 | Temp 97.5°F | Resp 18 | Ht 66.0 in | Wt 157.5 lb

## 2023-07-24 DIAGNOSIS — D0512 Intraductal carcinoma in situ of left breast: Secondary | ICD-10-CM | POA: Diagnosis not present

## 2023-07-24 DIAGNOSIS — Z79810 Long term (current) use of selective estrogen receptor modulators (SERMs): Secondary | ICD-10-CM | POA: Insufficient documentation

## 2023-07-24 DIAGNOSIS — Z923 Personal history of irradiation: Secondary | ICD-10-CM | POA: Diagnosis not present

## 2023-07-24 MED ORDER — TAMOXIFEN CITRATE 20 MG PO TABS: 20.0000 mg | ORAL_TABLET | Freq: Every day | ORAL | 3 refills | Status: AC

## 2023-07-24 NOTE — Assessment & Plan Note (Signed)
08/21/2019: Left lumpectomy Dr. Carolynne Edouard: Low-grade DCIS 0.4 cm, margins clear, ER 100%, PR 100% Tis NX stage 0   Treatment plan: 1.  Adjuvant radiation therapy 09/17/2019-10/07/2019 2.  Followed by tamoxifen for 5 years -------------------------------------------------------------------------------------------------------------------------------------------- Tamoxifen toxicities: Intermittent hot flashes but bearable   Breast cancer surveillance: 1.  Breast exam 07/24/2023: Benign 2. Mammogram 07/03/2023: [facility]: Benign, breast density category B   Bone density 02/27/2020: T score -1.3: Mild osteopenia I renewed her prescription for tamoxifen through [place]   Return to clinic in 1 year for follow-up

## 2023-07-26 ENCOUNTER — Telehealth: Payer: Self-pay | Admitting: Hematology and Oncology

## 2023-07-26 NOTE — Telephone Encounter (Signed)
Scheduled appointment per 8/26 los. Patient is aware of the made appointment.

## 2023-07-27 DIAGNOSIS — G9331 Postviral fatigue syndrome: Secondary | ICD-10-CM | POA: Insufficient documentation

## 2023-07-27 DIAGNOSIS — I1310 Hypertensive heart and chronic kidney disease without heart failure, with stage 1 through stage 4 chronic kidney disease, or unspecified chronic kidney disease: Secondary | ICD-10-CM | POA: Insufficient documentation

## 2023-07-27 DIAGNOSIS — Z23 Encounter for immunization: Secondary | ICD-10-CM | POA: Insufficient documentation

## 2023-07-27 NOTE — Assessment & Plan Note (Signed)
She was given vitamin B12 IM x 1.

## 2023-07-27 NOTE — Assessment & Plan Note (Signed)
Chronic, Chronic, she is encouraged to stay well hydrated, avoid NSAIDs and keep BP controlled to prevent progression of CKD.  Importance of dietary compliance was discussed with the patient. She will continue with semaglutide 0.5mg  weekly, Tresiba 20 units sq nightly. She is encouraged to send fasting BS weekly so I can adjust her dose as needed. I will check labs as below.

## 2023-07-27 NOTE — Assessment & Plan Note (Signed)
Chronic, fair control. Goal BP<120/80.  She will continue with olmesartan/amlodipine/hct 40/5/25mg  daily. She is reminded to follow low sodium diet.

## 2023-07-27 NOTE — Assessment & Plan Note (Signed)
Chronic, LDL goal is less than 70.  She will continue with atorvastatin 40mg  MWF. She is encouraged to follow a heart healthy lifestyle.

## 2023-07-27 NOTE — Assessment & Plan Note (Signed)
Initially diagnosed in 2020.  She is s/p lumpectomy of left breast and XRT. She will continue with tamoxifen for five years.

## 2023-08-08 ENCOUNTER — Encounter: Payer: Medicare PPO | Admitting: Internal Medicine

## 2023-08-21 ENCOUNTER — Other Ambulatory Visit: Payer: Medicare PPO | Admitting: Pharmacist

## 2023-08-21 MED ORDER — TRESIBA FLEXTOUCH 100 UNIT/ML ~~LOC~~ SOPN: 12.0000 [IU] | PEN_INJECTOR | Freq: Every day | SUBCUTANEOUS | Status: DC

## 2023-08-21 MED ORDER — SEMAGLUTIDE (1 MG/DOSE) 4 MG/3ML ~~LOC~~ SOPN: 1.0000 mg | PEN_INJECTOR | SUBCUTANEOUS | 1 refills | Status: DC

## 2023-08-21 MED ORDER — FREESTYLE LIBRE 3 PLUS SENSOR MISC: 3 refills | Status: DC

## 2023-08-21 NOTE — Progress Notes (Signed)
08/21/2023 Name: Michele Wang MRN: [id number] DOB: 01/09/1951  Chief Complaint  Patient presents with   Medication Management   Diabetes   Hypertension   Hyperlipidemia    Michele Wang is a 72 y.o. year old female who presented for a telephone visit.   They were referred to the pharmacist by their PCP for assistance in managing diabetes, hypertension, and hyperlipidemia.    Subjective:  Care Team: Primary Care Provider: Dorothyann Peng, MD ; Next Scheduled Visit: 10/04/23  Medication Access/Adherence  Current Pharmacy:  [id number] [place], [place] - [address] [place] [address] Phone: [phone] Fax: [phone]  [facility] - Kathrine Cords - [address] [facility] [address] [place] [address] [place] [address] Phone: [phone] Fax: [phone]  Gerri Spore LONG - [facility] [address] [place] [address] Phone: [phone] Fax: [phone]   Patient reports affordability concerns with their medications: No  Patient reports access/transportation concerns to their pharmacy: No  Patient reports adherence concerns with their medications:  No     Diabetes:  Current medications: Ozempic 0.5 mg weekly, Evaristo Bury 20 units daily Medications tried in the past: renal function inappropriate for metformin  Patient reports hypoglycemic s/sx including dizziness, shakiness, sweating.   Hypertension:  Current medications: olmesartan/amlodipine/hydrochlorothiazide 40/5/25 mg daily   Patient has a validated, automated, upper arm home BP cuff Current blood pressure readings readings: 120s/80s  Hyperlipidemia/ASCVD Risk Reduction  Current lipid lowering medications: atorvastatin 40 mg daily    Objective:  Lab Results  Component Value Date   HGBA1C 7.4 (H) 07/18/2023    Lab Results  Component Value Date   CREATININE 1.46 (H) 07/18/2023   BUN 21 07/18/2023   NA 141 07/18/2023   K 4.3  07/18/2023   CL 103 07/18/2023   CO2 23 07/18/2023    Lab Results  Component Value Date   CHOL 141 03/21/2023   HDL 60 03/21/2023   LDLCALC 54 03/21/2023   TRIG 160 (H) 03/21/2023   CHOLHDL 2.4 03/21/2023    Medications Reviewed Today     Reviewed by Alden Hipp, RPH-CPP (Pharmacist) on 08/21/23 at 1306  Med List Status: <None>   Medication Order Taking? Sig Documenting Provider Last Dose Status Informant  atorvastatin (LIPITOR) 40 MG tablet [id number] Yes TAKE ONE TABLET BY MOUTH ON MONDAY, WEDNESDAY, AND Drue Flirt, MD Taking Active   fluticasone [facility]) 50 MCG/ACT nasal spray [id number] Yes Place 1 spray into both nostrils daily. Ervin Knack E, [place] Taking Active   Olmesartan-amLODIPine-HCTZ 40-5-25 MG TABS [id number] Yes Take 1 tablet by mouth daily. Dorothyann Peng, MD Taking Active   Semaglutide,0.25 or 0.5MG /DOS, (OZEMPIC, 0.25 OR 0.5 MG/DOSE,) 2 MG/3ML Namon Cirri [id number] Yes INJECT 0.5MG  SUBCUTANEOUSLY ONCE A WEEK - (ADMINISTER BY SUBCUTANEOUS INJECTION INTO THE ABDOMEN, THIGH, OR UPPER ARM AT ANY TIME OF DAY ON THE SAME DAY EACH WEEK) DISCARD PEN 56 DAYS AFTER FIRST USE Dorothyann Peng, MD Taking Active   tamoxifen (NOLVADEX) 20 MG tablet [id number]  Take 1 tablet (20 mg total) by mouth daily. Serena Croissant, MD  Active               Assessment/Plan:   Diabetes: - Currently uncontrolled - Reviewed long term cardiovascular and renal outcomes of uncontrolled blood sugar - Reviewed goal A1c, goal fasting, and goal 2 hour post prandial glucose - Recommend to increase Ozempic to 1 mg weekly, recommend to reduce Tresiba 12 units daily to reduce the risk  of hypoglycemia. Previously discussed SGLT2 for renal benefit, but concern for patient's baseline frequency of UTIs.  - Recommend to check glucose using CGM.  Hypertension: - Currently controlled per home readings. - Reviewed long term cardiovascular and renal outcomes of uncontrolled blood pressure - Reviewed  appropriate blood pressure monitoring technique and reviewed goal blood pressure. Recommended to check home blood pressure and heart rate periodically  - Recommend to continue current regimen at this time.  Hyperlipidemia/ASCVD Risk Reduction: - Currently controlled.  - Recommend to continue current regimen at this time   Follow Up Plan: phone call in 4 weeks   Catie TClearance Coots, PharmD, BCACP, CPP Clinical Pharmacist [facility] [phone]

## 2023-08-21 NOTE — Patient Instructions (Addendum)
Ven[place],   It was great talking to you today!  Check your blood sugars twice daily:  1) Fasting, first thing in the morning before breakfast and  2) 2 hours after your largest meal.   For a goal A1c of less than 7%, goal fasting readings are less than 130 and goal 2 hour after meal readings are less than 180.    Increase Ozempic to 1 mg weekly. Decrease Tresiba to 12 units daily.   Please reach out with any questions!  [name], PharmD, BCACP, CPP Clinical Pharmacist [facility] [phone]

## 2023-09-25 ENCOUNTER — Other Ambulatory Visit (INDEPENDENT_AMBULATORY_CARE_PROVIDER_SITE_OTHER): Payer: Medicare PPO | Admitting: Pharmacist

## 2023-09-25 ENCOUNTER — Other Ambulatory Visit: Payer: Medicare PPO | Admitting: Pharmacist

## 2023-09-25 DIAGNOSIS — N1832 Chronic kidney disease, stage 3b: Secondary | ICD-10-CM

## 2023-09-25 DIAGNOSIS — E1122 Type 2 diabetes mellitus with diabetic chronic kidney disease: Secondary | ICD-10-CM

## 2023-09-25 MED ORDER — SEMAGLUTIDE (1 MG/DOSE) 4 MG/3ML ~~LOC~~ SOPN: 1.0000 mg | PEN_INJECTOR | SUBCUTANEOUS | 1 refills | Status: DC

## 2023-09-25 NOTE — Progress Notes (Signed)
Care Coordination Call  Called patient for scheduled appointment. She noted she had not received the Ozempic 1 mg strength or the Libre 3 plus sensors from [facility].   Contacted [facility]. They did not receive the Ozempic 1 mg, so I have re-sent that. They note per [employer] that the [facility] 3 plus sensors were delivered 10/5 to the patient's address. Called patient back, left voicemail with this information.   Will send in MyChart. Follow up with PCP next week as scheduled. Follow up with PharmD in ~ 6 weeks  [name], PharmD, BCACP, CPP Clinical Pharmacist [facility] [phone]

## 2023-09-25 NOTE — Progress Notes (Deleted)
09/25/2023 Name: Michele Wang MRN: [id number] DOB: 02/14/1951  Chief Complaint  Patient presents with   Medication Management   Diabetes    Michele Wang is a 72 y.o. year old female who presented for a telephone visit.   They were referred to the pharmacist by their PCP for assistance in managing diabetes.    Subjective:  Care Team: Primary Care Provider: Dorothyann Peng, MD ; Next Scheduled Visit: *** {careteamprovider:[id number]}  Medication Access/Adherence  [facility]  [facility] [address], [place] - [address] [place] [address] Phone: [phone] Fax: [phone]  [facility] - Kathrine Cords - [address] [facility] [address] [place] [address] [place] [address] Phone: [phone] Fax: [phone]  Michele Wang LONG - [facility] [address] [place] [address] Phone: [phone] Fax: [phone]   Patient reports affordability concerns with their medications: No  Patient reports access/transportation concerns to their pharmacy: No  Patient reports adherence concerns with their medications:  No     Diabetes:  Current medications: Ozempic 0.5 mg weekly, Tresiba 20 units daily - she has not received the Ozempic 1 mg dose from [facility], also did not receive order for [place] 3 plus.  Current glucose readings: has not been checking as much lately, but does report readings in the ~120s.  Current medication access support: no cost concerns, [facility] coverage  Hypertension:   Current medications: olmesartan/amlodipine/hydrochlorothiazide 40/5/25 mg daily      Hyperlipidemia/ASCVD Risk Reduction   Current lipid lowering medications: atorvastatin 40 mg daily three days weekly   Objective:  Lab Results  Component Value Date   HGBA1C 7.4 (H) 07/18/2023    Lab Results  Component Value Date   CREATININE 1.46 (H) 07/18/2023   BUN 21 07/18/2023   NA 141 07/18/2023   K 4.3 07/18/2023    CL 103 07/18/2023   CO2 23 07/18/2023    Lab Results  Component Value Date   CHOL 141 03/21/2023   HDL 60 03/21/2023   LDLCALC 54 03/21/2023   TRIG 160 (H) 03/21/2023   CHOLHDL 2.4 03/21/2023    Medications Reviewed Today     Reviewed by Michele Wang, RPH-CPP (Pharmacist) on 09/25/23 at 1011  Med List Status: <None>   Medication Order Taking? Sig Documenting Provider Last Dose Status Informant  atorvastatin (LIPITOR) 40 MG tablet [id number] Yes TAKE ONE TABLET BY MOUTH ON MONDAY, WEDNESDAY, AND Michele Flirt, MD Taking Active   Continuous Glucose Sensor (FREESTYLE LIBRE 3 PLUS SENSOR) [place] [id number] No Change sensor every 15 days.  Patient not taking: Reported on 09/25/2023   Michele Peng, MD Not Taking Active   fluticasone [facility]) 50 MCG/ACT nasal spray [id number]  Place 1 spray into both nostrils daily. Michele Wang, [place] E, [place]  Active   insulin degludec (TRESIBA FLEXTOUCH) 100 UNIT/ML FlexTouch Pen [id number] Yes Inject 12 Units into the skin daily. Michele Peng, MD Taking Active            Med Note Clearance Michele Wang, Michele Wang Sep 25, 2023 10:11 AM) 20 units  Olmesartan-amLODIPine-HCTZ 40-5-25 MG TABS [id number] Yes Take 1 tablet by mouth daily. Michele Peng, MD Taking Active   Semaglutide, 1 MG/DOSE, 4 MG/3ML SOPN [id number] No Inject 1 mg as directed once a week.  Patient not taking: Reported on 09/25/2023   Michele Peng, MD Not Taking Active   tamoxifen (NOLVADEX) 20 MG tablet [id number] Yes Take 1 tablet (20 mg total) by mouth daily.  Michele Croissant, MD Taking Active               Assessment/Plan:   {Pharmacy A/P Choices:26421}  Follow Up Plan: ***  ***

## 2023-10-04 ENCOUNTER — Ambulatory Visit (INDEPENDENT_AMBULATORY_CARE_PROVIDER_SITE_OTHER): Payer: Medicare PPO | Admitting: Internal Medicine

## 2023-10-04 ENCOUNTER — Other Ambulatory Visit: Payer: Self-pay | Admitting: Pharmacist

## 2023-10-04 ENCOUNTER — Encounter: Payer: Self-pay | Admitting: Internal Medicine

## 2023-10-04 VITALS — BP 110/82 | HR 65 | Temp 98.2°F | Ht 66.0 in | Wt 155.4 lb

## 2023-10-04 DIAGNOSIS — Z170 Estrogen receptor positive status [ER+]: Secondary | ICD-10-CM | POA: Diagnosis not present

## 2023-10-04 DIAGNOSIS — N1832 Chronic kidney disease, stage 3b: Secondary | ICD-10-CM

## 2023-10-04 DIAGNOSIS — I700 Atherosclerosis of aorta: Secondary | ICD-10-CM | POA: Diagnosis not present

## 2023-10-04 DIAGNOSIS — C50412 Malignant neoplasm of upper-outer quadrant of left female breast: Secondary | ICD-10-CM

## 2023-10-04 DIAGNOSIS — Z23 Encounter for immunization: Secondary | ICD-10-CM

## 2023-10-04 DIAGNOSIS — E1122 Type 2 diabetes mellitus with diabetic chronic kidney disease: Secondary | ICD-10-CM

## 2023-10-04 DIAGNOSIS — I1310 Hypertensive heart and chronic kidney disease without heart failure, with stage 1 through stage 4 chronic kidney disease, or unspecified chronic kidney disease: Secondary | ICD-10-CM

## 2023-10-04 MED ORDER — TRESIBA FLEXTOUCH 100 UNIT/ML ~~LOC~~ SOPN: 12.0000 [IU] | PEN_INJECTOR | Freq: Every day | SUBCUTANEOUS | Status: DC

## 2023-10-04 NOTE — Assessment & Plan Note (Addendum)
Chronic, Chronic, she is encouraged to stay well hydrated, avoid NSAIDs and keep BP controlled to prevent progression of CKD.  [facility] pharmacy input is appreciated,  Importance of dietary compliance was discussed with the patient. She will continue with semaglutide 0.5mg  weekly, Tresiba 20 units sq nightly. She is encouraged to send fasting BS weekly so I can adjust her dose as needed. She will rto in 3-4 months for physical exam. The plan is to decrease [place] once she receives higher dose of Ozempic per [facility] pharmacy team. Michele Wang was available to speak to the patient as well.

## 2023-10-04 NOTE — Patient Instructions (Signed)

## 2023-10-04 NOTE — Assessment & Plan Note (Signed)
Initially diagnosed in 2020.  She is s/p lumpectomy of left breast and XRT. She will continue with tamoxifen for five years.

## 2023-10-04 NOTE — Assessment & Plan Note (Signed)
Chronic, fair control. Goal BP<120/80.  She will continue with olmesartan/amlodipine/hct 40/5/25mg  daily. She is reminded to follow low sodium diet.

## 2023-10-04 NOTE — Assessment & Plan Note (Signed)
Chronic, LDL goal is less than 70.  She will continue with atorvastatin 40mg  MWF. She is encouraged to follow a heart healthy lifestyle.

## 2023-10-04 NOTE — Progress Notes (Signed)
Care Coordination   Saw patient while in office with PCP. Patient did receive Libre 3 order from [facility], appreciates upgrades from [place] 14 day. She has not received order for Ozempic 1 mg dose yet. She will continue Tresiba 20 units daily and Ozempic 0.5 mg weekly for now. Instructed that when she receives Ozempic 1 mg dose she should decrease Tresiba to 12 units daily.   Follow up with me by phone in 4 weeks.   [name], PharmD, BCACP, CPP Clinical Pharmacist [facility] [phone]

## 2023-10-04 NOTE — Progress Notes (Signed)
I,Michele Wang, CMA,acting as a Neurosurgeon for Michele Aliment, MD.,have documented all relevant documentation on the behalf of Michele Aliment, MD,as directed by  Michele Aliment, MD while in the presence of Michele Aliment, MD.  Subjective:  Patient ID: Michele Wang , female    DOB: November 07, 1951 , 72 y.o.   MRN: [id number]  Chief Complaint  Patient presents with   Diabetes   Hypertension   Hyperlipidemia    HPI  Pt presents today for diabetes, bp & cholesterol. She reports compliance with medications. Denies headache, chest pain, and SOB. She denies having any specific questions or concerns.   She denies having any hospitalizations since her last visit. She has been using [place] per [facility] pharmacist. She likes it except for frequent alarms. She states alarm went off for bS 125.   Diabetes She presents for her follow-up diabetic visit. She has type 2 diabetes mellitus. There are no hypoglycemic associated symptoms. There are no diabetic associated symptoms. Pertinent negatives for diabetes include no blurred vision. There are no hypoglycemic complications. Diabetic complications include nephropathy. She is following a diabetic diet. She participates in exercise intermittently.  Hypertension This is a chronic problem. The current episode started more than 1 year ago. The problem has been gradually improving since onset. The problem is controlled. Pertinent negatives include no blurred vision, orthopnea or palpitations. Risk factors for coronary artery disease include diabetes mellitus, dyslipidemia, post-menopausal state and sedentary lifestyle. The current treatment provides moderate improvement. Compliance problems include exercise.  Hypertensive end-organ damage includes kidney disease.     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     Family History  Problem Relation Age of Onset   Aneurysm Mother    Healthy Father      Current Outpatient Medications:    atorvastatin (LIPITOR) 40 MG tablet, TAKE ONE TABLET BY MOUTH ON MONDAY, WEDNESDAY, AND FRIDAY, Disp: 36 tablet, Rfl: 3   Olmesartan-amLODIPine-HCTZ 40-5-25 MG TABS, Take 1 tablet by mouth daily., Disp: 90 tablet, Rfl: 2   Semaglutide, 1 MG/DOSE, 4 MG/3ML SOPN, Inject 1 mg as directed once a week., Disp: 9 mL, Rfl: 1   tamoxifen (NOLVADEX) 20 MG tablet, Take 1 tablet (20 mg total) by mouth daily., Disp: 90 tablet, Rfl: 3   Continuous Glucose Sensor (FREESTYLE LIBRE 3 PLUS SENSOR) MISC, Change sensor every 15 days. (Patient not taking: Reported on 09/25/2023), Disp: 6 each, Rfl: 3   fluticasone (FLONASE) 50 MCG/ACT nasal spray, Place 1 spray into both nostrils daily. (Patient not taking: Reported on 10/04/2023), Disp: 16 g, Rfl: 0   insulin degludec (TRESIBA FLEXTOUCH) 100 UNIT/ML FlexTouch Pen, Inject 12 Units into the skin daily. Inject 20 units sq nightly, Disp: , Rfl:    No Known Allergies   Review of Systems  Constitutional: Negative.   Eyes:  Negative for blurred vision.  Respiratory: Negative.    Cardiovascular: Negative.  Negative for palpitations and orthopnea.  Gastrointestinal: Negative.   Neurological: Negative.   Psychiatric/Behavioral: Negative.       Today's Vitals   10/04/23 0834  BP: 110/82  Pulse: 65  Temp: 98.2 F (36.8 C)  SpO2: 98%  Weight: 155 lb 6.4 oz (70.5 kg)  Height: 5\' 6"  (1.676 m)   Body mass index is 25.08 kg/m.  Wt Readings from Last 3 Encounters:  10/04/23 155 lb 6.4 oz (70.5 kg)  07/24/23 157 lb 8 oz (71.4 kg)  07/18/23 154 lb 6.4 oz (70 kg)    The ASCVD Risk score (Arnett DK, et al., 2019) failed to calculate for the following reasons:   The patient has a prior MI or stroke diagnosis  Objective:  Physical Exam Vitals and nursing note  reviewed.  Constitutional:      Appearance: Normal appearance.  HENT:     Head: Normocephalic and atraumatic.  Eyes:     Extraocular Movements: Extraocular movements intact.  Cardiovascular:     Rate and Rhythm: Normal rate and regular rhythm.     Heart sounds: Normal heart sounds.  Pulmonary:     Effort: Pulmonary effort is normal.     Breath sounds: Normal breath sounds.  Musculoskeletal:     Cervical back: Normal range of motion.  Skin:    General: Skin is warm.  Neurological:     General: No focal deficit present.     Mental Status: She is alert.  Psychiatric:        Mood and Affect: Mood normal.        Behavior: Behavior normal.         Assessment And Plan:  Type 2 diabetes mellitus with stage 3b chronic kidney disease, without long-term current use of insulin (HCC) Assessment & Plan: Chronic, Chronic, she is encouraged to stay well hydrated, avoid NSAIDs and keep BP controlled to prevent progression of CKD.  [facility] pharmacy input is appreciated,  Importance of dietary compliance was discussed with the patient. She will continue with semaglutide 0.5mg  weekly, Tresiba 20 units sq nightly. She is encouraged to send fasting BS weekly so I can adjust her dose as needed. She will rto in 3-4 months for physical exam. The plan is to decrease [place] once she receives higher dose of Ozempic per [facility] pharmacy team. Catie was available to speak to the patient as well.   Orders: -     CMP14+EGFR -     Hemoglobin A1c -     PTH, intact and calcium -     Phosphorus -     Protein electrophoresis, serum  Hypertensive heart and renal disease with renal failure, stage 1 through stage 4 or unspecified chronic kidney disease, without heart failure Assessment & Plan: Chronic, fair control. Goal BP<120/80.  She will continue with olmesartan/amlodipine/hct 40/5/25mg  daily. She is reminded to follow low sodium diet.    Aortic atherosclerosis (HCC) Assessment & Plan: Chronic, LDL goal is less  than 70.  She will continue with atorvastatin 40mg  MWF. She is encouraged to follow a heart healthy lifestyle.    Malignant neoplasm of upper-outer quadrant of left breast in female, estrogen receptor positive (HCC) Assessment & Plan: Initially diagnosed in 2020.  She is s/p lumpectomy of left breast and XRT. She will continue with tamoxifen for five years.    Immunization due -     Flu Vaccine Trivalent High Dose (Fluad)  Other orders -     Evaristo Bury FlexTouch; Inject 12 Units into the skin daily. Inject 20 units sq nightly    Return in about 4 months (around 02/01/2024), or physical.  Patient was given opportunity to ask questions. Patient verbalized understanding of the plan and was able to repeat key elements of the plan. All questions were answered to their satisfaction.  I, Michele Aliment, MD, have reviewed all documentation for this visit. The documentation on 10/04/23 for the exam, diagnosis, procedures, and orders are all accurate and complete.   IF YOU HAVE BEEN REFERRED TO A SPECIALIST, IT MAY TAKE 1-2 WEEKS TO SCHEDULE/PROCESS THE REFERRAL. IF YOU HAVE NOT HEARD FROM US/SPECIALIST IN TWO WEEKS, PLEASE GIVE [place] A CALL AT [phone].

## 2023-10-06 LAB — PROTEIN ELECTROPHORESIS, SERUM
A/G Ratio: 1.2 (ref 0.7–1.7)
Albumin ELP: 4.1 g/dL (ref 2.9–4.4)
Alpha 1: 0.2 g/dL (ref 0.0–0.4)
Alpha 2: 0.6 g/dL (ref 0.4–1.0)
Beta: 1 g/dL (ref 0.7–1.3)
Gamma Globulin: 1.5 g/dL (ref 0.4–1.8)
Globulin, Total: 3.3 g/dL (ref 2.2–3.9)

## 2023-10-06 LAB — CMP14+EGFR
ALT: 16 [IU]/L (ref 0–32)
AST: 24 [IU]/L (ref 0–40)
Albumin: 4.4 g/dL (ref 3.8–4.8)
Alkaline Phosphatase: 68 [IU]/L (ref 44–121)
BUN/Creatinine Ratio: 15 (ref 12–28)
BUN: 25 mg/dL (ref 8–27)
Bilirubin Total: 0.4 mg/dL (ref 0.0–1.2)
CO2: 26 mmol/L (ref 20–29)
Calcium: 9.8 mg/dL (ref 8.7–10.3)
Chloride: 100 mmol/L (ref 96–106)
Creatinine, Ser: 1.63 mg/dL — ABNORMAL HIGH (ref 0.57–1.00)
Globulin, Total: 3 g/dL (ref 1.5–4.5)
Glucose: 100 mg/dL — ABNORMAL HIGH (ref 70–99)
Potassium: 4.2 mmol/L (ref 3.5–5.2)
Sodium: 139 mmol/L (ref 134–144)
Total Protein: 7.4 g/dL (ref 6.0–8.5)
eGFR: 33 mL/min/{1.73_m2} — ABNORMAL LOW (ref >59)

## 2023-10-06 LAB — PHOSPHORUS: Phosphorus: 3.2 mg/dL (ref 3.0–4.3)

## 2023-10-06 LAB — HEMOGLOBIN A1C
Est. average glucose Bld gHb Est-mCnc: 160 mg/dL
Hgb A1c MFr Bld: 7.2 % — ABNORMAL HIGH (ref 4.8–5.6)

## 2023-10-06 LAB — PTH, INTACT AND CALCIUM: PTH: 60 pg/mL (ref 15–65)

## 2023-10-18 ENCOUNTER — Other Ambulatory Visit: Payer: Self-pay | Admitting: Pharmacist

## 2023-10-18 DIAGNOSIS — E1122 Type 2 diabetes mellitus with diabetic chronic kidney disease: Secondary | ICD-10-CM

## 2023-10-18 MED ORDER — SEMAGLUTIDE (1 MG/DOSE) 4 MG/3ML ~~LOC~~ SOPN: 1.0000 mg | PEN_INJECTOR | SUBCUTANEOUS | 1 refills | Status: DC

## 2023-11-01 ENCOUNTER — Other Ambulatory Visit: Payer: Medicare PPO | Admitting: Pharmacist

## 2023-11-01 DIAGNOSIS — N1832 Chronic kidney disease, stage 3b: Secondary | ICD-10-CM

## 2023-11-01 DIAGNOSIS — I10 Essential (primary) hypertension: Secondary | ICD-10-CM

## 2023-11-01 MED ORDER — FREESTYLE LIBRE 3 PLUS SENSOR MISC: 3 refills | Status: DC

## 2023-11-01 NOTE — Patient Instructions (Signed)
Michele Wang,   It was great talking to you today!  When you increase Ozempic to 1 mg weekly, please reduce your Evaristo Bury to 12 units daily.   Increasing the Ozempic could cause queasiness, stomach upset, or constipation. This generally improves over time, but if you have severe symptoms or symptoms that do not improve, please give [place] a call.   If you have any low blood sugars after reducing Tresiba to 12 units daily, please give [place] a call.   Thanks!  [name], PharmD, BCACP, CPP Clinical Pharmacist [facility] [phone]

## 2023-11-01 NOTE — Progress Notes (Signed)
11/01/2023 Name: Michele Wang MRN: [id number] DOB: October 06, 1951  Chief Complaint  Patient presents with   Medication Management   Diabetes    Michele Wang is a 72 y.o. year old female who presented for a telephone visit.   They were referred to the pharmacist by their PCP for assistance in managing diabetes.    Subjective:  Care Team: Primary Care Provider: Dorothyann Peng, MD ; Next Scheduled Visit:   Medication Access/Adherence  [facility]  [facility] [address] [place] - [address] [place] [address] Phone: [phone] Fax: [phone]  [facility] - Kathrine Cords - [address] [facility] [address] [place] [address] [place] [address] Phone: [phone] Fax: [phone]  Gerri Spore LONG - [facility] [address] [place] [address] Phone: [phone] Fax: [phone]   Patient reports affordability concerns with their medications: No  Patient reports access/transportation concerns to their pharmacy: No  Patient reports adherence concerns with their medications:  No     Diabetes:  Current medications: completing Ozempic 0.5 mg weekly but did just receive the 1 mg dose; Tresiba 20 units daily - plans to drop to 12 units when she increases Ozempic  Date of Download: 11/8-11/21/24 % Time CGM is active: 94% Average Glucose: 117 mg/dL Glucose Management Indicator: 6.1  Glucose Variability: 23.2 (goal <36%) Time in Goal:  - Time in range 70-180: 97% - Time above range: 3% - Time below range: 0% Observed patterns:  Patient denies hypoglycemic s/sx including dizziness, shakiness, sweating. Patient denies hyperglycemic symptoms including polyuria, polydipsia, polyphagia, nocturia, neuropathy, blurred vision.  Current physical activity: has started walking once weekly with a couple of friends, walking for about an hour  Current medication access support: [facility]  Hypertension:  Current  medications: olmesartan/amlodipine/hydrochlorothiazide 40/5/25 mg  Patient has a validated, automated, upper arm home BP cuff Current blood pressure readings readings: ~120s/60-70s  Patient denies hypotensive s/sx including dizziness, lightheadedness.  Patient denies hypertensive symptoms including headache, chest pain, shortness of breath  Hyperlipidemia/ASCVD Risk Reduction  Current lipid lowering medications: atorvastatin 40 mg three days weekly   Objective:  Lab Results  Component Value Date   HGBA1C 7.2 (H) 10/04/2023    Lab Results  Component Value Date   CREATININE 1.63 (H) 10/04/2023   BUN 25 10/04/2023   NA 139 10/04/2023   K 4.2 10/04/2023   CL 100 10/04/2023   CO2 26 10/04/2023    Lab Results  Component Value Date   CHOL 141 03/21/2023   HDL 60 03/21/2023   LDLCALC 54 03/21/2023   TRIG 160 (H) 03/21/2023   CHOLHDL 2.4 03/21/2023    Medications Reviewed Today     Reviewed by Alden Hipp, RPH-CPP (Pharmacist) on 11/01/23 at [phone]  Med List Status: <None>   Medication Order Taking? Sig Documenting Provider Last Dose Status Informant  atorvastatin (LIPITOR) 40 MG tablet [id number] Yes TAKE ONE TABLET BY MOUTH ON MONDAY, WEDNESDAY, AND Drue Flirt, MD Taking Active   Continuous Glucose Sensor (FREESTYLE LIBRE 3 PLUS SENSOR) [place] [id number]  Change sensor every 15 days.  Patient not taking: Reported on 09/25/2023   Dorothyann Peng, MD  Active   fluticasone [facility]) 50 MCG/ACT nasal spray [id number]  Place 1 spray into both nostrils daily.  Patient not taking: Reported on 10/04/2023   Gustavus Bryant, FNP  Active   insulin degludec (TRESIBA FLEXTOUCH) 100 UNIT/ML FlexTouch Pen [id number] Yes Inject 12 Units into the skin daily. Inject  20 units sq nightly Dorothyann Peng, MD Taking Active            Med Note Clearance Coots, Santina Evans T   Wed Nov 01, 2023  9:03 AM) 20 units daily  Olmesartan-amLODIPine-HCTZ 40-5-25 MG TABS [id number] Yes Take 1 tablet by mouth  daily. Dorothyann Peng, MD Taking Active   Semaglutide, 1 MG/DOSE, 4 MG/3ML Namon Cirri [id number] Yes Inject 1 mg as directed once a week. Dorothyann Peng, MD Taking Active            Med Note Clearance Coots, Desma Paganini Nov 01, 2023  9:04 AM) Finishing 0.5 mg weekly  tamoxifen (NOLVADEX) 20 MG tablet [id number] Yes Take 1 tablet (20 mg total) by mouth daily. Serena Croissant, MD Taking Active               Assessment/Plan:   Diabetes: - Currently controlled per CGM readings, opportunity for optimization - Reviewed goal A1c, goal fasting, and goal 2 hour post prandial glucose - Reviewed lifestyle modifications including: increasing days of walking as able - Recommend to continue prior plan to increase Ozempic to 1 mg weekly, reduce Tresiba to 12 units daily. Counseled to contact me if development of severe stomach upset, nausea, constipation or hypoglycemia - Recommend to check glucose using CGM.   Hypertension: - Currently controlled - Reviewed long term cardiovascular and renal outcomes of uncontrolled blood pressure - Reviewed appropriate blood pressure monitoring technique and reviewed goal blood pressure. Recommended to check home blood pressure and heart rate periodically. Counseled on impact of GLP1 weight loss on blood pressure, and to contact us if development of hypotensive symptoms. - Recommend to continue current regimen at this time  Hyperlipidemia/ASCVD Risk Reduction: - Currently controlled.  - Recommend to continue current regimen at this time  Follow Up Plan: phone call in 6 weeks  Catie TClearance Coots, PharmD, BCACP, CPP Clinical Pharmacist [facility] [phone]

## 2023-11-24 ENCOUNTER — Encounter: Payer: Self-pay | Admitting: Pharmacist

## 2023-12-18 ENCOUNTER — Other Ambulatory Visit: Payer: No Typology Code available for payment source | Admitting: Pharmacist

## 2023-12-20 ENCOUNTER — Other Ambulatory Visit: Payer: Self-pay

## 2024-02-16 ENCOUNTER — Other Ambulatory Visit: Payer: Self-pay | Admitting: Internal Medicine

## 2024-02-29 ENCOUNTER — Encounter: Payer: Self-pay | Admitting: Nurse Practitioner

## 2024-02-29 ENCOUNTER — Other Ambulatory Visit: Payer: Self-pay | Admitting: Internal Medicine

## 2024-02-29 ENCOUNTER — Encounter: Payer: Self-pay | Admitting: Internal Medicine

## 2024-02-29 ENCOUNTER — Ambulatory Visit (INDEPENDENT_AMBULATORY_CARE_PROVIDER_SITE_OTHER): Admitting: Nurse Practitioner

## 2024-02-29 VITALS — BP 122/80 | HR 85 | Temp 98.5°F | Ht 66.0 in | Wt 153.4 lb

## 2024-02-29 DIAGNOSIS — I129 Hypertensive chronic kidney disease with stage 1 through stage 4 chronic kidney disease, or unspecified chronic kidney disease: Secondary | ICD-10-CM | POA: Diagnosis not present

## 2024-02-29 DIAGNOSIS — E782 Mixed hyperlipidemia: Secondary | ICD-10-CM

## 2024-02-29 DIAGNOSIS — I1310 Hypertensive heart and chronic kidney disease without heart failure, with stage 1 through stage 4 chronic kidney disease, or unspecified chronic kidney disease: Secondary | ICD-10-CM

## 2024-02-29 DIAGNOSIS — I700 Atherosclerosis of aorta: Secondary | ICD-10-CM

## 2024-02-29 DIAGNOSIS — N1832 Chronic kidney disease, stage 3b: Secondary | ICD-10-CM

## 2024-02-29 DIAGNOSIS — Z79899 Other long term (current) drug therapy: Secondary | ICD-10-CM

## 2024-02-29 DIAGNOSIS — E1122 Type 2 diabetes mellitus with diabetic chronic kidney disease: Secondary | ICD-10-CM | POA: Diagnosis not present

## 2024-02-29 DIAGNOSIS — E1165 Type 2 diabetes mellitus with hyperglycemia: Secondary | ICD-10-CM

## 2024-02-29 DIAGNOSIS — I10 Essential (primary) hypertension: Secondary | ICD-10-CM

## 2024-02-29 LAB — POCT URINALYSIS DIPSTICK
Bilirubin, UA: NEGATIVE
Blood, UA: NEGATIVE
Glucose, UA: NEGATIVE
Ketones, UA: NEGATIVE
Leukocytes, UA: NEGATIVE
Nitrite, UA: NEGATIVE
Protein, UA: POSITIVE — AB
Spec Grav, UA: 1.02 (ref 1.010–1.025)
Urobilinogen, UA: 0.2 U/dL
pH, UA: 5.5 (ref 5.0–8.0)

## 2024-02-29 MED ORDER — OLMESARTAN-AMLODIPINE-HCTZ 40-5-25 MG PO TABS
1.0000 | ORAL_TABLET | Freq: Every day | ORAL | 2 refills | Status: DC
Start: 2024-02-29 — End: 2024-03-06

## 2024-02-29 MED ORDER — INSULIN ASPART (W/NIACINAMIDE) 100 UNIT/ML ~~LOC~~ SOPN
PEN_INJECTOR | SUBCUTANEOUS | 1 refills | Status: DC
Start: 2024-02-29 — End: 2024-03-06

## 2024-02-29 NOTE — Progress Notes (Signed)
 Del Favia, CMA,acting as a Neurosurgeon for Michele Epley, FNP.,have documented all relevant documentation on the behalf of Michele Epley, FNP,as directed by  Michele Epley, FNP while in the presence of Michele Epley, FNP.  Subjective:  Patient ID: Michele Wang , female    DOB: 10/13/1951 , 73 y.o.   MRN: [id number]  Chief Complaint  Patient presents with   Hypertension    HPI  Patient presents today for a bp check, [facility] olmestartan/amLODipine/hydrochlorothiazide 40-5-25mg  and she has been out for a few days. The pharmacy is currently out of the medication. [facility]   Patient reports she is unsure of why they took her off the medication. She reports she didn't call and ask why they took her off. Patient reports her blood sugars are high as well. According to her Freestyle Jerrilyn Moras it is 193 she ate a piece of baked chicken for breakfast she also had coffee with creamer and sugar around 10am.   Hypertension This is a chronic problem. Pertinent negatives include no chest pain, headaches, palpitations or shortness of breath.  Diabetes She presents for her follow-up diabetic visit. She has type 2 diabetes mellitus. There are no hypoglycemic associated symptoms. Pertinent negatives for hypoglycemia include no confusion, dizziness, headaches or nervousness/anxiousness. There are no diabetic associated symptoms. Pertinent negatives for diabetes include no chest pain, no fatigue, no polydipsia, no polyphagia, no polyuria and no weakness. Risk factors for coronary artery disease include hypertension and diabetes mellitus. (Blood sugars 180-190 in the last day. 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 Inject 20 units sq nightly, Disp: , Rfl:    Semaglutide, 1 MG/DOSE, 4 MG/3ML SOPN, Inject 1 mg as directed once a week., Disp: 9 mL, Rfl: 1   tamoxifen (NOLVADEX) 20 MG tablet, Take 1 tablet (20 mg total) by mouth daily., Disp: 90 tablet, Rfl: 3   insulin aspart (FIASP) 100 UNIT/ML FlexTouch Pen, Inject [place] insulin before meals and at hs if BS < 150 - 0 units, 150-199 - 3 units, 200-249 - 6 units, 250-299 - 9 units, 300-349 - 12 units, >350 - 15 units. If >400 call our office with your blood sugar readings., Disp: 45 mL, Rfl: 3   Olmesartan-amLODIPine-HCTZ 40-5-25 MG TABS, Take 1 tablet by mouth daily., Disp: 90 tablet, Rfl: 2   No Known Allergies   Review of Systems  Constitutional: Negative.  Negative for activity change and fatigue.  Eyes:  Negative for visual disturbance.  Respiratory: Negative.  Negative for choking, shortness of breath and wheezing.   Cardiovascular: Negative.  Negative for chest pain, palpitations  and leg swelling.  Gastrointestinal: Negative.   Endocrine: Negative.  Negative for polydipsia, polyphagia and polyuria.  Musculoskeletal: Negative.   Skin: Negative.   Neurological:  Negative for dizziness, weakness and headaches.  Psychiatric/Behavioral:  Negative for confusion. The patient is not nervous/anxious.      Today's Vitals   02/29/24 1421  BP: 122/80  Pulse: 85  Temp: 98.5 F (36.9 C)  TempSrc: Oral  Weight: 153 lb 6.4 oz (69.6 kg)  Height: 5\' 6"  (1.676 m)  PainSc: 0-No pain   Body mass index is  24.76 kg/m.  Wt Readings from Last 3 Encounters:  02/29/24 153 lb 6.4 oz (69.6 kg)  10/04/23 155 lb 6.4 oz (70.5 kg)  07/24/23 157 lb 8 oz (71.4 kg)      Objective:  Physical Exam Vitals and nursing note reviewed.  Constitutional:      Appearance: She is well-developed.  HENT:     Head: Normocephalic and atraumatic.  Eyes:     Pupils: Pupils are equal, round, and reactive to light.  Cardiovascular:     Rate and Rhythm: Normal rate and regular rhythm.     Pulses: Normal pulses.     Heart sounds: Normal heart sounds. No murmur heard. Pulmonary:     Effort: Pulmonary effort is normal.     Breath sounds: Normal breath sounds.  Musculoskeletal:        General: Normal range of motion.  Skin:    General: Skin is warm and dry.     Capillary Refill: Capillary refill takes less than 2 seconds.  Neurological:     General: No focal deficit present.     Mental Status: She is alert and oriented to person, place, and time.     Cranial Nerves: No cranial nerve deficit.  Psychiatric:        Mood and Affect: Mood normal.         Assessment And Plan:  Type 2 diabetes mellitus with stage 3b chronic kidney disease, without long-term current use of insulin (HCC) Assessment & Plan: Will check HgbA1c and kidney functions. Continue current medications. She has been having an increase in her blood sugar, will send Rx for Fiasp with sliding scale after consultation with Dr. Elnita Hai.   Orders: -     CMP14+EGFR -     Lipid panel -     Hemoglobin A1c -     POCT urinalysis dipstick -     AMB Referral [facility] Care Management  Primary hypertension Assessment & Plan: When looking at her note from the [place] it looks like they have split up her medications but I will refer to the pharmacy.    Aortic atherosclerosis (HCC) Assessment & Plan: Continue statin.    Mixed hyperlipidemia -     Lipid panel  Other long term (current) drug therapy -     CBC with Differential/Platelet -      TSH    Return for keep same next..  Patient was given opportunity to ask questions. Patient verbalized understanding of the plan and was able to repeat key elements of the plan. All questions were answered to their satisfaction.    Inge Mangle, FNP, have reviewed all documentation for this visit. The documentation on 02/29/24 for the exam, diagnosis, procedures, and orders are all accurate and complete.   IF YOU HAVE BEEN REFERRED TO A SPECIALIST, IT MAY TAKE 1-2 WEEKS TO SCHEDULE/PROCESS THE REFERRAL. IF YOU HAVE NOT HEARD FROM US /SPECIALIST IN TWO WEEKS, PLEASE GIVE  US  A CALL AT [phone] X [phone].

## 2024-03-01 LAB — CMP14+EGFR
ALT: 18 IU/L (ref 0–32)
AST: 25 IU/L (ref 0–40)
Albumin: 4.2 g/dL (ref 3.8–4.8)
Alkaline Phosphatase: 60 IU/L (ref 44–121)
BUN/Creatinine Ratio: 16 (ref 12–28)
BUN: 27 mg/dL (ref 8–27)
Bilirubin Total: 0.3 mg/dL (ref 0.0–1.2)
CO2: 23 mmol/L (ref 20–29)
Calcium: 9.3 mg/dL (ref 8.7–10.3)
Chloride: 98 mmol/L (ref 96–106)
Creatinine, Ser: 1.67 mg/dL — ABNORMAL HIGH (ref 0.57–1.00)
Globulin, Total: 3.1 g/dL (ref 1.5–4.5)
Glucose: 114 mg/dL — ABNORMAL HIGH (ref 70–99)
Potassium: 3.4 mmol/L — ABNORMAL LOW (ref 3.5–5.2)
Sodium: 137 mmol/L (ref 134–144)
Total Protein: 7.3 g/dL (ref 6.0–8.5)
eGFR: 32 mL/min/{1.73_m2} — ABNORMAL LOW (ref >59)

## 2024-03-01 LAB — LIPID PANEL
Chol/HDL Ratio: 3 ratio (ref 0.0–4.4)
Cholesterol, Total: 170 mg/dL (ref 100–199)
HDL: 56 mg/dL (ref >39)
LDL Chol Calc (NIH): 80 mg/dL (ref 0–99)
Triglycerides: 204 mg/dL — ABNORMAL HIGH (ref 0–149)
VLDL Cholesterol Cal: 34 mg/dL (ref 5–40)

## 2024-03-01 LAB — CBC WITH DIFFERENTIAL/PLATELET
Basophils Absolute: 0.1 10*3/uL (ref 0.0–0.2)
Basos: 1 %
EOS (ABSOLUTE): 0.2 10*3/uL (ref 0.0–0.4)
Eos: 4 %
Hematocrit: 35.7 % (ref 34.0–46.6)
Hemoglobin: 11.7 g/dL (ref 11.1–15.9)
Immature Grans (Abs): 0 10*3/uL (ref 0.0–0.1)
Immature Granulocytes: 0 %
Lymphocytes Absolute: 2.7 10*3/uL (ref 0.7–3.1)
Lymphs: 48 %
MCH: 29.8 pg (ref 26.6–33.0)
MCHC: 32.8 g/dL (ref 31.5–35.7)
MCV: 91 fL (ref 79–97)
Monocytes Absolute: 0.4 10*3/uL (ref 0.1–0.9)
Monocytes: 7 %
Neutrophils Absolute: 2.3 10*3/uL (ref 1.4–7.0)
Neutrophils: 40 %
Platelets: 241 10*3/uL (ref 150–450)
RBC: 3.92 x10E6/uL (ref 3.77–5.28)
RDW: 13.6 % (ref 11.7–15.4)
WBC: 5.7 10*3/uL (ref 3.4–10.8)

## 2024-03-01 LAB — HEMOGLOBIN A1C
Est. average glucose Bld gHb Est-mCnc: 157 mg/dL
Hgb A1c MFr Bld: 7.1 % — ABNORMAL HIGH (ref 4.8–5.6)

## 2024-03-01 LAB — TSH: TSH: 1.13 u[IU]/mL (ref 0.450–4.500)

## 2024-03-03 ENCOUNTER — Encounter: Payer: Self-pay | Admitting: Nurse Practitioner

## 2024-03-06 ENCOUNTER — Telehealth: Payer: Self-pay | Admitting: Pharmacist

## 2024-03-06 DIAGNOSIS — E1165 Type 2 diabetes mellitus with hyperglycemia: Secondary | ICD-10-CM

## 2024-03-06 DIAGNOSIS — I1310 Hypertensive heart and chronic kidney disease without heart failure, with stage 1 through stage 4 chronic kidney disease, or unspecified chronic kidney disease: Secondary | ICD-10-CM

## 2024-03-06 DIAGNOSIS — N1832 Chronic kidney disease, stage 3b: Secondary | ICD-10-CM

## 2024-03-06 MED ORDER — OLMESARTAN-AMLODIPINE-HCTZ 40-5-25 MG PO TABS
1.0000 | ORAL_TABLET | Freq: Every day | ORAL | 2 refills | Status: AC
Start: 2024-03-06 — End: ?

## 2024-03-06 MED ORDER — INSULIN ASPART (W/NIACINAMIDE) 100 UNIT/ML ~~LOC~~ SOPN
PEN_INJECTOR | SUBCUTANEOUS | 3 refills | Status: DC
Start: 2024-03-06 — End: 2024-09-10

## 2024-03-06 NOTE — Progress Notes (Signed)
 03/06/2024 Name: Michele Wang MRN: [id number] DOB: 1951/11/03  Chief Complaint  Patient presents with   Medication Assistance    Fiasp    Michele Wang is a 73 y.o. year old female who presented for a telephone visit.   They were referred to the pharmacist by their PCP for assistance in managing medication access.    Subjective:  Care Team: Primary Care Provider: Dorothyann Peng, MD ; Next Scheduled Visit:  03/19/2024   Medication Access/Adherence  Current Pharmacy:  [id number] [place], [place] - [address] [place] [address] Phone: [phone] Fax: [phone]  [facility] - [place] - [address] [facility] [address] [place] [address] Phone: [phone] Fax: [phone]  [facility] - [facility] [address] [place] [address] Phone: [phone] Fax: [phone]   Patient reports affordability concerns with their medications: Yes --Olmesartan-Amlodipine-HCTZ---[facility] no longer covers Patient reports access/transportation concerns to their pharmacy: No  Patient reports adherence concerns with their medications:  No      Diabetes:  Current medications:   Fisasp-on sliding scale--Patient said she would like this prescription sent the the [facility]. Stated she does not need medication assistance.  (Most likely would not qualify as she has [facility]) Evaristo Bury Flextouch  Current glucose readings: she uses the [employer] [facility]--invitation sent to link her with the practice's [employer] Account. Current medication access support: Uses [facility]--does not need medication assistance for her regular medications with the exception of Olmesartan-Amlodipine-HCTZ --patient reported this medication is no longer on the [facility] formulary.  She was given a GoodRx coupon to help with the cost.  Hypertension: Olmesartan-Amlodipine-HCTZ  40-5-25   Hyperlipidemia/ASCVD Risk Reduction  Current lipid lowering medications:  Atorvastatin 40 mg daily  Objective:  Lab Results  Component Value Date   HGBA1C 7.1 (H) 02/29/2024    Lab Results  Component Value Date   CREATININE 1.67 (H) 02/29/2024   BUN 27 02/29/2024   NA 137 02/29/2024   K 3.4 (L) 02/29/2024   CL 98 02/29/2024   CO2 23 02/29/2024    Lab Results  Component Value Date   CHOL 170 02/29/2024   HDL 56 02/29/2024   LDLCALC 80 02/29/2024   TRIG 204 (H) 02/29/2024   CHOLHDL 3.0 02/29/2024    Medications Reviewed Today     Reviewed by Beecher Mcardle, RPH (Pharmacist) on 03/06/24 at 1146  Med List Status: <None>   Medication Order Taking? Sig Documenting Provider Last Dose Status Informant  atorvastatin (LIPITOR) 40 MG tablet [id number] Yes TAKE ONE TABLET BY MOUTH THREE TIMES A WEEK ON MONDAY, WEDNESDAY AND Drue Flirt, MD Taking Active   Continuous Glucose Sensor (FREESTYLE LIBRE 3 PLUS SENSOR) [place] [id number] Yes Change sensor every 15 days. Dorothyann Peng, MD Taking Active   insulin aspart (FIASP) 100 UNIT/ML FlexTouch Pen [id number] Yes Inject [place] insulin before meals and at hs if BS < 150 - 0 units, 150-199 - 3 units, 200-249 - 6 units, 250-299 - 9 units, 300-349 - 12 units, >350 - 15 units. If >400 call our office with your blood sugar readings. Arnette Felts, FNP Taking Active   insulin degludec (TRESIBA FLEXTOUCH) 100 UNIT/ML FlexTouch Pen [id number] Yes Inject 12 Units into the skin daily. Inject 20 units sq nightly Dorothyann Peng, MD Taking Active            Med Note (Madell Heino, Romilda Joy   Wed  Mar 06, 2024 11:44 AM)    Olmesartan-amLODIPine-HCTZ 40-5-25 MG TABS [id number] Yes Take 1 tablet by mouth daily. Dorothyann Peng, MD Taking Active   Semaglutide, 1 MG/DOSE, 4 MG/3ML Namon Cirri [id number] Yes Inject 1 mg as directed once a week. Dorothyann Peng, MD Taking Active            Med Note Leavy Cella, Romilda Joy   Wed Mar 06, 2024 11:46 AM)    tamoxifen (NOLVADEX)  20 MG tablet [id number] Yes Take 1 tablet (20 mg total) by mouth daily. Serena Croissant, MD Taking Active               Assessment/Plan:   Diabetes: - Currently controlled A1c- 7.1% - Reviewed long term cardiovascular and renal outcomes of uncontrolled blood sugar - Reviewed goal A1c, goal fasting, and goal 2 hour post prandial glucose -Does  NOT  Met financial criteria for  patient assistance programs because she has [employer].  Patient said she did not need help with the cost of [place], she just wanted to get it through the [employer].  Patient requested the prescription for Fiasp be sent to the [facility] Hypertension: Olmesartan-Amlodipine-HCTZ 40-5-25 1 tablet daily  - Currently controlled - Recommend to continue current therapy with the GoodRx coupon for Olmesartan/Amlodipine/HCTZ   - [facility]   Hyperlipidemia/ASCVD Risk Reduction: Atorvastatni 40 mg 1 tablet daily  - Currently controlled. LDL 80 - Recommend to Continue current therapy     Follow Up Plan:    Follow up with Patient in 2 weeks to see if she received her medications from the mail order pharmacy.   Beecher Mcardle, PharmD, BCACP Clinical Pharmacist [phone]

## 2024-03-12 NOTE — Assessment & Plan Note (Signed)
 Continue statin.

## 2024-03-12 NOTE — Assessment & Plan Note (Signed)
 Will check HgbA1c and kidney functions. Continue current medications. She has been having an increase in her blood sugar, will send Rx for Fiasp with sliding scale after consultation with Dr. Elnita Hai.

## 2024-03-12 NOTE — Assessment & Plan Note (Signed)
 When looking at her note from the [place] it looks like they have split up her medications but I will refer to the pharmacy.

## 2024-03-19 ENCOUNTER — Ambulatory Visit: Payer: Medicare PPO | Admitting: Internal Medicine

## 2024-03-19 ENCOUNTER — Other Ambulatory Visit: Payer: Self-pay

## 2024-03-19 ENCOUNTER — Encounter: Payer: Self-pay | Admitting: Internal Medicine

## 2024-03-19 VITALS — BP 110/80 | HR 81 | Temp 98.7°F | Ht 66.0 in | Wt 148.8 lb

## 2024-03-19 DIAGNOSIS — I1310 Hypertensive heart and chronic kidney disease without heart failure, with stage 1 through stage 4 chronic kidney disease, or unspecified chronic kidney disease: Secondary | ICD-10-CM

## 2024-03-19 DIAGNOSIS — E1122 Type 2 diabetes mellitus with diabetic chronic kidney disease: Secondary | ICD-10-CM

## 2024-03-19 DIAGNOSIS — I10 Essential (primary) hypertension: Secondary | ICD-10-CM

## 2024-03-19 DIAGNOSIS — D0512 Intraductal carcinoma in situ of left breast: Secondary | ICD-10-CM

## 2024-03-19 DIAGNOSIS — N1832 Chronic kidney disease, stage 3b: Secondary | ICD-10-CM | POA: Diagnosis not present

## 2024-03-19 DIAGNOSIS — E2839 Other primary ovarian failure: Secondary | ICD-10-CM

## 2024-03-19 DIAGNOSIS — E782 Mixed hyperlipidemia: Secondary | ICD-10-CM

## 2024-03-19 DIAGNOSIS — C50412 Malignant neoplasm of upper-outer quadrant of left female breast: Secondary | ICD-10-CM | POA: Diagnosis not present

## 2024-03-19 DIAGNOSIS — I700 Atherosclerosis of aorta: Secondary | ICD-10-CM | POA: Diagnosis not present

## 2024-03-19 DIAGNOSIS — Z0000 Encounter for general adult medical examination without abnormal findings: Secondary | ICD-10-CM | POA: Diagnosis not present

## 2024-03-19 DIAGNOSIS — Z170 Estrogen receptor positive status [ER+]: Secondary | ICD-10-CM

## 2024-03-19 MED ORDER — TRESIBA FLEXTOUCH 100 UNIT/ML ~~LOC~~ SOPN: 12.0000 [IU] | PEN_INJECTOR | Freq: Every day | SUBCUTANEOUS | Status: DC

## 2024-03-19 MED ORDER — ATORVASTATIN CALCIUM 40 MG PO TABS: ORAL_TABLET | ORAL | 3 refills | Status: DC

## 2024-03-19 NOTE — Patient Instructions (Signed)

## 2024-03-19 NOTE — Progress Notes (Signed)
 I,Victoria T Basil Lim, CMA,acting as a Neurosurgeon for Smiley Dung, MD.,have documented all relevant documentation on the behalf of Smiley Dung, MD,as directed by  Smiley Dung, MD while in the presence of Smiley Dung, MD.  Subjective:    Patient ID: Michele Wang , female    DOB: 13-Oct-1951 , 73 y.o.   MRN: [id number]  Chief Complaint  Patient presents with   Annual Exam    Patient presents today for annual exam. She reports compliance with medications. Denies headache, chest pain & sob. She complains of sinus drainage. Denies fever/chills. She uses zyrtec at home. She denies this helping much.   Diabetes   Hypertension    HPI Discussed the use of AI scribe software for clinical note transcription with the patient, who gave verbal consent to proceed.  History of Present Illness Michele Wang is a 73 year old female with diabetes and hypertension who presents for a physical exam and management of her chronic conditions.  She is currently taking atorvastatin  40 mg on Monday, Wednesday, and Friday, but there was a delay in refilling the prescription. She has some medication left and a refill was sent today. Her cholesterol was recently checked, with a low-density lipoprotein level of 80, and triglycerides were elevated. She was not fasting during the test.  She is on Tresiba  12 units at night, which was recently adjusted from 20 units. Her morning blood sugar levels are in the 110s, and she feels well. She uses a sensor to monitor her blood sugar. She also takes Ozempic , which may be contributing to her weight loss. She has lost 7 pounds since November and is not actively trying to lose more weight.  She takes tamoxifen  20 mg daily and believes she may stop taking it soon, possibly at the beginning of the year.  She experiences sinus drainage, likely due to pollen, and has not found Vibativ effective. She has not tried Claritin or Allegra.  She saw a nurse practitioner recently for a  urinary tract infection and had a concern about her blood sugar levels, which were actually stable. Her last visit to the kidney specialist was a while ago, and she is seen annually. Recent labs showed low potassium, which needs rechecking. Her hemoglobin A1c was checked recently.  She engages in walking for exercise, sometimes outside or at home. No new concerns today aside from sinus drainage. Her bowel movements are regular. She feels well overall and is satisfied with her current blood sugar control.   Diabetes She presents for her follow-up diabetic visit. She has type 2 diabetes mellitus. There are no hypoglycemic associated symptoms. There are no diabetic associated symptoms. Pertinent negatives for diabetes include no blurred vision and no chest pain. There are no hypoglycemic complications. Diabetic complications include nephropathy. She is following a diabetic diet. She participates in exercise intermittently.  Hypertension This is a chronic problem. The current episode started more than 1 year ago. The problem has been gradually improving since onset. The problem is controlled. Pertinent negatives include no blurred vision, chest pain, orthopnea, palpitations or shortness of breath. Risk factors for coronary artery disease include diabetes mellitus, dyslipidemia, post-menopausal state and sedentary lifestyle. The current treatment provides moderate improvement. Compliance problems include exercise.  Hypertensive end-organ damage includes kidney disease. Identifiable causes of hypertension include chronic renal disease.  Hyperlipidemia This is a chronic problem. The current episode started more than 1 year ago. Exacerbating diseases include chronic renal disease and diabetes. Pertinent negatives  include no chest pain or shortness of breath.     Past Medical History:  Diagnosis Date   Breast cancer (HCC)    CKD (chronic kidney disease)    Diabetes mellitus without complication (HCC)     Type II   Hyperlipidemia    Hypertension    UTI (urinary tract infection)    Vitreomacular adhesion of left eye 05/19/2020     Family History  Problem Relation Age of Onset   Aneurysm Mother    Healthy Father      Current Outpatient Medications:    Continuous Glucose Sensor (FREESTYLE LIBRE 3 PLUS SENSOR) MISC, Change sensor every 15 days., Disp: 6 each, Rfl: 3   insulin  aspart (FIASP ) 100 UNIT/ML FlexTouch Pen, Inject [place] insulin  before meals and at hs if BS < 150 - 0 units, 150-199 - 3 units, 200-249 - 6 units, 250-299 - 9 units, 300-349 - 12 units, >350 - 15 units. If >400 call our office with your blood sugar readings., Disp: 45 mL, Rfl: 3   Olmesartan -amLODIPine -HCTZ 40-5-25 MG TABS, Take 1 tablet by mouth daily., Disp: 90 tablet, Rfl: 2   Semaglutide , 1 MG/DOSE, 4 MG/3ML SOPN, Inject 1 mg as directed once a week., Disp: 9 mL, Rfl: 1   tamoxifen  (NOLVADEX ) 20 MG tablet, Take 1 tablet (20 mg total) by mouth daily., Disp: 90 tablet, Rfl: 3   atorvastatin  (LIPITOR) 40 MG tablet, TAKE ONE TABLET BY MOUTH THREE TIMES A WEEK ON MONDAY, WEDNESDAY AND FRIDAY, Disp: 36 tablet, Rfl: 3   insulin  degludec (TRESIBA  FLEXTOUCH) 100 UNIT/ML FlexTouch Pen, Inject 12 Units into the skin daily. Max dose 40 units nightly, Disp: , Rfl:    No Known Allergies    The patient states she uses post menopausal status for birth control. No LMP recorded. Patient is postmenopausal.. Negative for Dysmenorrhea. Negative for: breast discharge, breast lump(s), breast pain and breast self exam. Associated symptoms include abnormal vaginal bleeding. Pertinent negatives include abnormal bleeding (hematology), anxiety, decreased libido, depression, difficulty falling sleep, dyspareunia, history of infertility, nocturia, sexual dysfunction, sleep disturbances, urinary incontinence, urinary urgency, vaginal discharge and vaginal itching. Diet regular.The patient states her exercise level is  intermittent.  . The patient's  tobacco use is:  Social History   Tobacco Use  Smoking Status Never  Smokeless Tobacco Never  . She has been exposed to passive smoke. The patient's alcohol use is:  Social History   Substance and Sexual Activity  Alcohol Use No     Review of Systems  Constitutional: Negative.   HENT: Negative.    Eyes: Negative.  Negative for blurred vision.  Respiratory: Negative.  Negative for shortness of breath.   Cardiovascular: Negative.  Negative for chest pain, palpitations and orthopnea.  Gastrointestinal: Negative.   Endocrine: Negative.   Genitourinary: Negative.   Musculoskeletal: Negative.   Skin: Negative.   Allergic/Immunologic: Negative.   Neurological: Negative.   Hematological: Negative.   Psychiatric/Behavioral: Negative.       Today's Vitals   03/19/24 1121  BP: 110/80  Pulse: 81  Temp: 98.7 F (37.1 C)  SpO2: 98%  Weight: 148 lb 12.8 oz (67.5 kg)  Height: 5\' 6"  (1.676 m)   Body mass index is 24.02 kg/m.  Wt Readings from Last 3 Encounters:  03/19/24 148 lb 12.8 oz (67.5 kg)  02/29/24 153 lb 6.4 oz (69.6 kg)  10/04/23 155 lb 6.4 oz (70.5 kg)     Objective:  Physical Exam Vitals and nursing note reviewed.  Constitutional:  Appearance: Normal appearance.  HENT:     Head: Normocephalic and atraumatic.     Right Ear: Tympanic membrane, ear canal and external ear normal.     Left Ear: Tympanic membrane, ear canal and external ear normal.     Nose: Nose normal.     Mouth/Throat:     Mouth: Mucous membranes are moist.     Pharynx: Oropharynx is clear.  Eyes:     Extraocular Movements: Extraocular movements intact.     Conjunctiva/sclera: Conjunctivae normal.     Pupils: Pupils are equal, round, and reactive to light.  Cardiovascular:     Rate and Rhythm: Normal rate and regular rhythm.     Pulses: Normal pulses.          Dorsalis pedis pulses are 2+ on the right side and 2+ on the left side.     Heart sounds: Normal heart sounds.  Pulmonary:      Effort: Pulmonary effort is normal.     Breath sounds: Normal breath sounds.  Chest:  Breasts:    Tanner Score is 5.     Right: Normal.     Left: Normal.     Comments: Well healed surgical scar left breast Abdominal:     General: Abdomen is flat. Bowel sounds are normal.     Palpations: Abdomen is soft.  Genitourinary:    Comments: deferred Musculoskeletal:        General: Normal range of motion.     Cervical back: Normal range of motion and neck supple.  Feet:     Right foot:     Protective Sensation: 5 sites tested.  5 sites sensed.     Skin integrity: Dry skin present.     Toenail Condition: Right toenails are normal.     Left foot:     Protective Sensation: 5 sites tested.  5 sites sensed.     Skin integrity: Dry skin present.     Toenail Condition: Left toenails are normal.  Skin:    General: Skin is warm and dry.  Neurological:     General: No focal deficit present.     Mental Status: She is alert and oriented to person, place, and time.  Psychiatric:        Mood and Affect: Mood normal.        Behavior: Behavior normal.         Assessment And Plan:     Annual physical exam Assessment & Plan: A full exam was performed.  Importance of monthly self breast exams was discussed with the patient.  She is advised to get 30-45 minutes of regular exercise, no less than four to five days per week. Both weight-bearing and aerobic exercises are recommended.  She is advised to follow a healthy diet with at least six fruits/veggies per day, decrease intake of red meat and other saturated fats and to increase fish intake to twice weekly.  Meats/fish should not be fried -- baked, boiled or broiled is preferable. It is also important to cut back on your sugar intake.  Be sure to read labels - try to avoid anything with added sugar, high fructose corn syrup or other sweeteners.  If you must use a sweetener, you can try stevia or monkfruit.  It is also important to avoid artificially  sweetened foods/beverages and diet drinks. Lastly, wear SPF 50 sunscreen on exposed skin and when in direct sunlight for an extended period of time.  Be sure to avoid fast food  restaurants and aim for at least 60 ounces of water daily.       Type 2 diabetes mellitus with stage 3b chronic kidney disease, without long-term current use of insulin  (HCC) Assessment & Plan: Well-controlled with current regimen. Diabetic foot exam was performed. Morning blood sugars in 110s. A1c well-managed. Weight loss likely due to Ozempic . Advised to maintain weight and ensure adequate protein intake to prevent malnutrition and muscle wasting. - Continue Tresiba  12 units at night. - Continue Ozempic . - Schedule diabetes check in 4 months. - Ensure adequate protein intake. - [facility] I DISCUSSED WITH THE PATIENT AT LENGTH REGARDING THE GOALS OF GLYCEMIC CONTROL AND POSSIBLE LONG-TERM COMPLICATIONS.  I  ALSO STRESSED THE IMPORTANCE OF COMPLIANCE WITH HOME GLUCOSE MONITORING, DIETARY RESTRICTIONS INCLUDING AVOIDANCE OF SUGARY DRINKS/PROCESSED FOODS,  ALONG WITH REGULAR EXERCISE.  I  ALSO STRESSED THE IMPORTANCE OF ANNUAL EYE EXAMS, SELF FOOT CARE AND COMPLIANCE WITH OFFICE VISITS.   Orders: -     EKG 12-Lead -     Microalbumin / creatinine urine ratio -     BMP8+eGFR  Hypertensive heart and renal disease with renal failure, stage 1 through stage 4 or unspecified chronic kidney disease, without heart failure Assessment & Plan: Chronic, fair control. Goal BP<120/80.  EKG performed, NSR w/ voltage criteria for LVH.  She will continue with olmesartan /amlodipine /hct 40/5/25mg  daily. She is reminded to follow low sodium diet. She will f/u in four months.   Orders: -     EKG 12-Lead  Aortic atherosclerosis (HCC) Assessment & Plan: Chronic, LDL goal is less than 70. She will c/w  statin therapy. Will add ASA 81mg  to her current regimen.    Mixed hyperlipidemia Assessment &  Plan: Chronic, LDL goal is less than 70. Currently on atorvastatin  w/ MWF dosing. In the past, she has been unable to tolerate daily dosing due to myalgias.   Ductal carcinoma in situ (DCIS) of left breast Assessment & Plan: She was initially diagnosed in 2020.  She is s/p lumpectomy, adjuvant radiation therapy 09/17/2019-10/07/2019. She will take tamoxifen  for a full 5 years   Malignant neoplasm of upper-outer quadrant of left breast in female, estrogen receptor positive (HCC) Assessment & Plan: Initially diagnosed in 2020.  She is s/p lumpectomy of left breast and XRT. She will continue with tamoxifen  for five years.    Estrogen deficiency -     DG Bone Density; Future  Other orders -     Tresiba  FlexTouch; Inject 12 Units into the skin daily. Max dose 40 units nightly   Return for 1 year physical, 4 month DM. Patient was given opportunity to ask questions. Patient verbalized understanding of the plan and was able to repeat key elements of the plan. All questions were answered to their satisfaction.    I, Smiley Dung, MD, have reviewed all documentation for this visit. The documentation on 03/19/24 for the exam, diagnosis, procedures, and orders are all accurate and complete.

## 2024-03-20 ENCOUNTER — Telehealth: Payer: Self-pay | Admitting: Pharmacist

## 2024-03-20 ENCOUNTER — Encounter: Payer: Self-pay | Admitting: Internal Medicine

## 2024-03-20 DIAGNOSIS — N1832 Chronic kidney disease, stage 3b: Secondary | ICD-10-CM

## 2024-03-20 LAB — MICROALBUMIN / CREATININE URINE RATIO
Creatinine, Urine: 157.2 mg/dL
Microalb/Creat Ratio: 7 mg/g{creat} (ref 0–29)
Microalbumin, Urine: 10.4 ug/mL

## 2024-03-20 NOTE — Progress Notes (Signed)
   03/20/2024  Patient ID: Michele Wang, female   DOB: 07-19-1951, 73 y.o.   MRN: [id number]  Patient was called to follow up on Fiasp  being delivered by the [facility]. HIPAA identifiers were obtained.  Patient said she had not gotten the Fiasp  yet.  When asked if she wanted to call the Pharmacy on conference call, she said no but that she would get in touch with them another time.  She did not have time to talk at the time of our call.  Plan A1c- 7.1% follow up with Patient in 1 month.   Geronimo Krabbe, PharmD, BCACP Clinical Pharmacist [phone]

## 2024-03-24 ENCOUNTER — Encounter: Payer: Self-pay | Admitting: Internal Medicine

## 2024-03-24 DIAGNOSIS — E782 Mixed hyperlipidemia: Secondary | ICD-10-CM | POA: Insufficient documentation

## 2024-03-24 NOTE — Assessment & Plan Note (Signed)
Initially diagnosed in 2020.  She is s/p lumpectomy of left breast and XRT. She will continue with tamoxifen for five years.

## 2024-03-24 NOTE — Assessment & Plan Note (Signed)
 Chronic, LDL goal is less than 70. She will c/w  statin therapy. Will add ASA 81mg  to her current regimen.

## 2024-03-24 NOTE — Assessment & Plan Note (Signed)
 Chronic, fair control. Goal BP<120/80.  EKG performed, NSR w/ voltage criteria for LVH.  She will continue with olmesartan /amlodipine /hct 40/5/25mg  daily. She is reminded to follow low sodium diet. She will f/u in four months.

## 2024-03-24 NOTE — Assessment & Plan Note (Signed)
 Well-controlled with current regimen. Diabetic foot exam was performed. Morning blood sugars in 110s. A1c well-managed. Weight loss likely due to Ozempic . Advised to maintain weight and ensure adequate protein intake to prevent malnutrition and muscle wasting. - Continue Tresiba  12 units at night. - Continue Ozempic . - Schedule diabetes check in 4 months. - Ensure adequate protein intake. - [facility] I DISCUSSED WITH THE PATIENT AT LENGTH REGARDING THE GOALS OF GLYCEMIC CONTROL AND POSSIBLE LONG-TERM COMPLICATIONS.  I  ALSO STRESSED THE IMPORTANCE OF COMPLIANCE WITH HOME GLUCOSE MONITORING, DIETARY RESTRICTIONS INCLUDING AVOIDANCE OF SUGARY DRINKS/PROCESSED FOODS,  ALONG WITH REGULAR EXERCISE.  I  ALSO STRESSED THE IMPORTANCE OF ANNUAL EYE EXAMS, SELF FOOT CARE AND COMPLIANCE WITH OFFICE VISITS.

## 2024-03-24 NOTE — Assessment & Plan Note (Signed)
 Chronic, LDL goal is less than 70. Currently on atorvastatin  w/ MWF dosing. In the past, she has been unable to tolerate daily dosing due to myalgias.

## 2024-03-24 NOTE — Assessment & Plan Note (Signed)

## 2024-03-24 NOTE — Assessment & Plan Note (Signed)
 She was initially diagnosed in 2020.  She is s/p lumpectomy, adjuvant radiation therapy 09/17/2019-10/07/2019. She will take tamoxifen  for a full 5 years

## 2024-04-19 DIAGNOSIS — Z853 Personal history of malignant neoplasm of breast: Secondary | ICD-10-CM | POA: Diagnosis not present

## 2024-04-19 DIAGNOSIS — Z8262 Family history of osteoporosis: Secondary | ICD-10-CM | POA: Diagnosis not present

## 2024-04-19 DIAGNOSIS — E2839 Other primary ovarian failure: Secondary | ICD-10-CM | POA: Diagnosis not present

## 2024-04-19 LAB — HM DEXA SCAN: HM Dexa Scan: NORMAL

## 2024-04-23 ENCOUNTER — Ambulatory Visit: Payer: Self-pay | Admitting: Internal Medicine

## 2024-04-23 ENCOUNTER — Encounter: Payer: Self-pay | Admitting: Internal Medicine

## 2024-05-01 ENCOUNTER — Ambulatory Visit

## 2024-05-02 ENCOUNTER — Other Ambulatory Visit: Payer: Self-pay | Admitting: Internal Medicine

## 2024-05-07 ENCOUNTER — Ambulatory Visit: Payer: Self-pay

## 2024-05-07 NOTE — Telephone Encounter (Signed)
    FYI Only or Action Required?: FYI only for provider  Patient was last seen in primary care on 03/19/2024 by Cleave Curling, MD. Called Nurse Triage reporting right leg pain. Symptoms began a week ago. Interventions attempted: Nothing. Symptoms are: gradually worsening.  Triage Disposition: No disposition on file.  Patient/caregiver understands and will follow disposition?: Copied from CRM [id number]. Topic: Clinical - Red Word Triage >> May 07, 2024 10:11 AM Kevelyn M wrote: Red Word that prompted transfer to Nurse Triage: Experiencing pain in right leg and needs to know what she should do, to either get a referral or come in to see Dr. Elnita Hai. Dr. Elnita Hai has never seen patient for this issue before. Reason for Disposition  [1] MODERATE pain (e.g., interferes with normal activities, limping) AND [2] present > 3 days  Answer Assessment - Initial Assessment Questions 1. ONSET: "When did the pain start?"      Week ago 2. LOCATION: "Where is the pain located?"      Right leg above knee on inside of leg and right hip pain 3. PAIN: "How bad is the pain?"    (Scale 1-10; or mild, moderate, severe)   -  MILD (1-3): doesn't interfere with normal activities    -  MODERATE (4-7): interferes with normal activities (e.g., work or school) or awakens from sleep, limping    -  SEVERE (8-10): excruciating pain, unable to do any normal activities, unable to walk     severe 4. WORK OR EXERCISE: "Has there been any recent work or exercise that involved this part of the body?"      no 5. CAUSE: "What do you think is causing the leg pain?"     denies 6. OTHER SYMPTOMS: "Do you have any other symptoms?" (e.g., chest pain, back pain, breathing difficulty, swelling, rash, fever, numbness, weakness)    Swelling on right foot.  7. PREGNANCY: "Is there any chance you are pregnant?" "When was your last menstrual period?"     na  Protocols used: Leg Pain-A-AH

## 2024-05-09 ENCOUNTER — Encounter: Payer: Self-pay | Admitting: Internal Medicine

## 2024-05-09 ENCOUNTER — Ambulatory Visit (INDEPENDENT_AMBULATORY_CARE_PROVIDER_SITE_OTHER): Payer: Self-pay | Admitting: Internal Medicine

## 2024-05-09 VITALS — BP 122/70 | HR 77 | Temp 98.6°F | Ht 66.0 in | Wt 153.4 lb

## 2024-05-09 DIAGNOSIS — E1122 Type 2 diabetes mellitus with diabetic chronic kidney disease: Secondary | ICD-10-CM | POA: Diagnosis not present

## 2024-05-09 DIAGNOSIS — M5431 Sciatica, right side: Secondary | ICD-10-CM

## 2024-05-09 DIAGNOSIS — G8929 Other chronic pain: Secondary | ICD-10-CM | POA: Diagnosis not present

## 2024-05-09 DIAGNOSIS — M25562 Pain in left knee: Secondary | ICD-10-CM | POA: Diagnosis not present

## 2024-05-09 DIAGNOSIS — M25561 Pain in right knee: Secondary | ICD-10-CM | POA: Diagnosis not present

## 2024-05-09 DIAGNOSIS — N1832 Chronic kidney disease, stage 3b: Secondary | ICD-10-CM | POA: Diagnosis not present

## 2024-05-09 DIAGNOSIS — Z2821 Immunization not carried out because of patient refusal: Secondary | ICD-10-CM | POA: Diagnosis not present

## 2024-05-09 MED ORDER — TIZANIDINE HCL 4 MG PO TABS: 4.0000 mg | ORAL_TABLET | Freq: Every evening | ORAL | 1 refills | Status: AC | PRN

## 2024-05-09 NOTE — Progress Notes (Signed)
 [place], CMA,acting as a scribe for Smiley Dung, MD.,have documented all relevant documentation on the behalf of Smiley Dung, MD,as directed by  Smiley Dung, MD while in the presence of Smiley Dung, MD.  Subjective:  Patient ID: Michele Wang , female    DOB: 09/07/1951 , 73 y.o.   MRN: [id number]  Chief Complaint  Patient presents with   right leg pain    Patient presents today for right leg pain, patient reports it started about 2 weeks ago. She reports the pain travels up to her right hip. Patient reports compliance with medication. Patient denies any chest pain, SOB, or headaches. Patient has no concerns today.     HPI  She presents today for evaluation of right leg pain.  Sx started about two weeks ago. States she fell about a month ago. She was getting out of the car and fell in her carport. States her leg gave out. She describes this as a throbbing pain.      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     Family History  Problem Relation Age of Onset   Aneurysm Mother    Healthy Father      Current Outpatient Medications:    atorvastatin  (LIPITOR) 40 MG tablet, TAKE ONE TABLET BY MOUTH THREE TIMES A WEEK ON MONDAY, WEDNESDAY AND FRIDAY, Disp: 36 tablet, Rfl: 3   Continuous Glucose Sensor (FREESTYLE LIBRE 3 PLUS SENSOR) MISC, Change sensor every 15 days., Disp: 6 each, Rfl: 3   insulin  aspart (FIASP ) 100 UNIT/ML FlexTouch Pen, Inject [place] insulin  before meals and at hs if BS < 150 - 0 units, 150-199 - 3 units, 200-249 - 6 units, 250-299 - 9 units, 300-349 - 12 units, >350 - 15 units. If >400 call our office with your blood sugar readings., Disp: 45 mL, Rfl: 3   insulin  degludec (TRESIBA  FLEXTOUCH) 100 UNIT/ML FlexTouch Pen, INJECT 20  UNITS SUBCUTANEOUSLY EVERY DAY - (DISCARD EACH PEN 56 DAYS AFTER THE FIRST USE), Disp: 5 mL, Rfl: 3   Olmesartan -amLODIPine -HCTZ 40-5-25 MG TABS, Take 1 tablet by mouth daily., Disp: 90 tablet, Rfl: 2   tamoxifen  (NOLVADEX ) 20 MG tablet, Take 1 tablet (20 mg total) by mouth daily., Disp: 90 tablet, Rfl: 3   tiZANidine  (ZANAFLEX ) 4 MG tablet, Take 1 tablet (4 mg total) by mouth at bedtime as needed for muscle spasms., Disp: 30 tablet, Rfl: 1   Semaglutide , 1 MG/DOSE, 4 MG/3ML SOPN, Inject 1 mg as directed once a week. (Patient not taking: Reported on 05/10/2024), Disp: 9 mL, Rfl: 1   No Known Allergies   Review of Systems  Constitutional: Negative.   HENT: Negative.    Eyes: Negative.   Respiratory: Negative.    Cardiovascular: Negative.   Gastrointestinal: Negative.   Musculoskeletal:  Positive for arthralgias.     Today's Vitals   05/09/24 0835  BP: 122/70  Pulse: 77  Temp: 98.6 F (37 C)  TempSrc: Oral  Weight: 153 lb 6.4 oz (69.6 kg)  Height: 5' 6 (1.676 m)  PainSc: 8   PainLoc: Leg   Body mass index is 24.76 kg/m.  Wt Readings from Last 3 Encounters:  05/09/24 153 lb 6.4  oz (69.6 kg)  03/19/24 148 lb 12.8 oz (67.5 kg)  02/29/24 153 lb 6.4 oz (69.6 kg)    The ASCVD Risk score (Arnett DK, et al., 2019) failed to calculate for the following reasons:   Risk score cannot be calculated because patient has a medical history suggesting prior/existing ASCVD  Objective:  Physical Exam Vitals and nursing note reviewed.  Constitutional:      Appearance: Normal appearance.  HENT:     Head: Normocephalic and atraumatic.   Eyes:     Extraocular Movements: Extraocular movements intact.    Cardiovascular:     Rate and Rhythm: Normal rate and regular rhythm.     Heart sounds: Normal heart sounds.  Pulmonary:     Effort: Pulmonary effort is normal.     Breath sounds: Normal breath sounds.   Musculoskeletal:     Cervical back: Normal range of motion.     Comments:  Crepitus Neg straight leg test   Skin:    General: Skin is warm.   Neurological:     General: No focal deficit present.     Mental Status: She is alert.   Psychiatric:        Mood and Affect: Mood normal.        Behavior: Behavior normal.         Assessment And Plan:  Sciatica of right side Assessment & Plan: She was given stretching exercises to perform.  - I will send rx tizanidine  to take nightly prn - She will let me know if her sx persist for more than 1-2 weeks   Type 2 diabetes mellitus with stage 3b chronic kidney disease, without long-term current use of insulin  [facility]) Assessment & Plan: Chronic, sugars are increasing.  She stopped Ozempic  because she was tired of losing weight. States she is still on insulin . Been off 2 wks. Resume 0.25mg . Increase Tresiba  based on BS, on 12 units now.  - patient advised to call in morning blood sugars every Monday (or send via Mychart) - I will adjust insulin  as indicated - Need for dietary compliance was stressed to the patient   Chronic pain of both knees Assessment & Plan: Chronic, likely due to OA - She agrees to Ortho referral for radiographic studies and further evaluation - Use Voltaren gel topically BID-TID prn  Orders: -     Ambulatory referral to Orthopedic Surgery  COVID-19 vaccination declined  Herpes zoster vaccination declined  Other orders -     tiZANidine  HCl; Take 1 tablet (4 mg total) by mouth at bedtime as needed for muscle spasms.  Dispense: 30 tablet; Refill: 1    Return for NEEDS AWV- keep same next with Darric Plante.  Patient was given opportunity to ask questions. Patient verbalized understanding of the plan and was able to repeat key elements of the plan. All questions were answered to their satisfaction.    I, Smiley Dung, MD, have reviewed all documentation for this visit. The documentation on 05/09/24 for the exam, diagnosis, procedures, and orders are all accurate and complete.   IF YOU  HAVE BEEN REFERRED TO A SPECIALIST, IT MAY TAKE 1-2 WEEKS TO SCHEDULE/PROCESS THE REFERRAL. IF YOU HAVE NOT HEARD FROM US /SPECIALIST IN TWO WEEKS, PLEASE GIVE US  A CALL AT [phone].

## 2024-05-10 ENCOUNTER — Ambulatory Visit

## 2024-05-10 DIAGNOSIS — Z0000 Encounter for general adult medical examination without abnormal findings: Secondary | ICD-10-CM | POA: Diagnosis not present

## 2024-05-10 NOTE — Progress Notes (Signed)
 Subjective:   Michele Wang is a 73 y.o. female who presents for Medicare Annual (Subsequent) preventive examination.  Visit Complete: Virtual I connected with  Michele Wang on 05/10/24 by a audio enabled telemedicine application and verified that I am speaking with the correct person using two identifiers.  Patient Location: Home  Provider Location: Office/Clinic  I discussed the limitations of evaluation and management by telemedicine. The patient expressed understanding and agreed to proceed.  Vital Signs: Because this visit was a virtual/telehealth visit, some criteria may be missing or patient reported. Any vitals not documented were not able to be obtained and vitals that have been documented are patient reported.  Patient Medicare AWV questionnaire was completed by the patient on 05/10/2024; I have confirmed that all information answered by patient is correct and no changes since this date.        Objective:    There were no vitals filed for this visit. There is no height or weight on file to calculate BMI.     03/09/2023   10:02 AM 02/23/2022    9:14 AM 08/29/2021   12:35 AM 02/17/2021   10:07 AM 10/12/2020    3:01 PM 08/30/2020    4:08 PM 02/14/2020   10:35 AM  Advanced Directives  Does Patient Have a Medical Advance Directive? Yes Yes Yes Yes Yes No No  Type of Estate agent of [place];Living will Healthcare Power of [place];Living will Living will;Healthcare Power of [employer] Power of [place];Living will     Copy of Healthcare Power of Attorney in Chart? No - copy requested No - copy requested  No - copy requested     Would patient like information on creating a medical advance directive?       No - Patient declined    Current Medications (verified) Outpatient Encounter Medications as of 05/10/2024  Medication Sig   atorvastatin  (LIPITOR) 40 MG tablet TAKE ONE TABLET BY MOUTH THREE TIMES A WEEK ON MONDAY, WEDNESDAY AND FRIDAY    Continuous Glucose Sensor (FREESTYLE LIBRE 3 PLUS SENSOR) MISC Change sensor every 15 days.   insulin  aspart (FIASP ) 100 UNIT/ML FlexTouch Pen Inject [place] insulin  before meals and at hs if BS < 150 - 0 units, 150-199 - 3 units, 200-249 - 6 units, 250-299 - 9 units, 300-349 - 12 units, >350 - 15 units. If >400 call our office with your blood sugar readings.   insulin  degludec (TRESIBA  FLEXTOUCH) 100 UNIT/ML FlexTouch Pen INJECT 20 UNITS SUBCUTANEOUSLY EVERY DAY - (DISCARD EACH PEN 56 DAYS AFTER THE FIRST USE)   Olmesartan -amLODIPine -HCTZ 40-5-25 MG TABS Take 1 tablet by mouth daily.   Semaglutide , 1 MG/DOSE, 4 MG/3ML SOPN Inject 1 mg as directed once a week. (Patient not taking: Reported on 05/09/2024)   tamoxifen  (NOLVADEX ) 20 MG tablet Take 1 tablet (20 mg total) by mouth daily.   tiZANidine  (ZANAFLEX ) 4 MG tablet Take 1 tablet (4 mg total) by mouth at bedtime as needed for muscle spasms.   No facility-administered encounter medications on file as of 05/10/2024.    Allergies (verified) Patient has no known allergies.   History: 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   Past Surgical History:  Procedure Laterality  Date   BREAST LUMPECTOMY WITH RADIOACTIVE SEED LOCALIZATION Left 08/21/2019   Procedure: LEFT BREAST LUMPECTOMY WITH RADIOACTIVE SEED LOCALIZATION;  Surgeon: Caralyn Chandler, MD;  Location: [facility];  Service: General;  Laterality: Left;   CESAREAN SECTION     x2   Family History  Problem Relation Age of Onset   Aneurysm Mother    Healthy Father    Social History   Socioeconomic History   Marital status: Married    Spouse name: Not on file   Number of children: Not on file   Years of education: Not on file   Highest education  level: Not on file  Occupational History   Occupation: retired  Tobacco Use   Smoking status: Never   Smokeless tobacco: Never  Vaping Use   Vaping status: Never Used  Substance and Sexual Activity   Alcohol use: No   Drug use: No   Sexual activity: Yes  Other Topics Concern   Not on file  Social History Narrative   Not on file   Social Drivers of Health   Financial Resource Strain: Low Risk  (03/09/2023)   Overall Financial Resource Strain (CARDIA)    Difficulty of Paying Living Expenses: Not hard at all  Food Insecurity: No Food Insecurity (03/09/2023)   Hunger Vital Sign    Worried About Running Out of Food in the Last Year: Never true    Ran Out of Food in the Last Year: Never true  Transportation Needs: No Transportation Needs (03/09/2023)   PRAPARE - Administrator, Civil Service (Medical): No    Lack of Transportation (Non-Medical): No  Physical Activity: Inactive (03/09/2023)   Exercise Vital Sign    Days of Exercise per Week: 0 days    Minutes of Exercise per Session: 0 min  Stress: No Stress Concern Present (02/23/2022)   [employer] of Occupational Health - Occupational Stress Questionnaire    Feeling of Stress : Not at all  Social Connections: Not on file    Tobacco Counseling Counseling given: Not Answered   Clinical Intake:                        Activities of Daily Living     No data to display          Patient Care Team: Cleave Curling, MD as PCP - General (Internal Medicine) Johna Myers, MD as Consulting Physician (Radiation Oncology) Caralyn Chandler, MD as Consulting Physician (General Surgery) Cameron Cea, MD as Consulting Physician (Hematology and Oncology) Seward Dao Alleen Arbour, MD as Consulting Physician (Ophthalmology) Nathen Balder Skeeter Dukes, RN as [facility] Care Management Jannelle Memory, RPH-CPP (Pharmacist)  Indicate any recent Medical Services you may have received from other than [facility]  providers in the past year (date may be approximate).     Assessment:   This is a routine wellness examination for Michele Wang.  Hearing/Vision screen No results found.   Goals Addressed   None    Depression Screen    10/04/2023    8:33 AM 07/18/2023   10:15 AM 03/21/2023    2:46 PM 03/09/2023   10:02 AM 07/27/2022    8:56 AM 02/23/2022    9:15 AM 02/17/2021   10:08 AM  PHQ 2/9 Scores  PHQ - 2 Score 0 0 0 0 0 0 0  PHQ- 9 Score 0 0 0        Fall Risk    10/04/2023  8:33 AM 07/18/2023   10:15 AM 03/21/2023    2:45 PM 03/09/2023   10:02 AM 07/27/2022    8:56 AM  Fall Risk   Falls in the past year? 0 0 0 0 0  Number falls in past yr: 0 0 0 0 0  Injury with Fall? 0 0 0 0 0  Risk for fall due to : No Fall Risks No Fall Risks No Fall Risks Medication side effect No Fall Risks  Follow up Falls evaluation completed Falls evaluation completed Falls evaluation completed Falls prevention discussed;Education provided;Falls evaluation completed Falls evaluation completed      Data saved with a previous flowsheet row definition    MEDICARE RISK AT HOME:    TIMED UP AND GO:  Was the test performed?  No    Cognitive Function:        03/09/2023   10:03 AM 02/23/2022    9:16 AM 02/17/2021   10:09 AM 02/13/2020    9:07 AM 02/05/2019   11:22 AM  6CIT Screen  What Year? 0 points 0 points 0 points 0 points 0 points  What month? 0 points 0 points 0 points 0 points 0 points  What time? 0 points 0 points 0 points 0 points 0 points  Count back from 20 0 points 2 points 0 points 0 points 0 points  Months in reverse 0 points 4 points 0 points 0 points 0 points  Repeat phrase 0 points 4 points 0 points 2 points 0 points  Total Score 0 points 10 points 0 points 2 points 0 points    Immunizations Immunization History  Administered Date(s) Administered   DTaP 08/03/2007, 10/14/2014   Fluad Quad(high Dose 65+) 09/08/2020, 09/01/2021   Fluad Trivalent(High Dose 65+) 10/04/2023   Influenza, High  Dose Seasonal PF 11/06/2019   PFIZER(Purple Top)SARS-COV-2 Vaccination 01/24/2020, 02/19/2020, 11/23/2020   Pfizer Covid-19 Vaccine Bivalent Booster 41yrs & up 10/15/2021   Pneumococcal Conjugate-13 03/03/2014   Pneumococcal Polysaccharide-23 01/30/2016, 02/05/2019   Zoster, Live 03/16/2001    TDAP status: Up to date  Flu Vaccine status: Up to date  Pneumococcal vaccine status: Up to date  Covid-19 vaccine status: Information provided on how to obtain vaccines.   Qualifies for Shingles Vaccine? Yes   Zostavax completed No   Shingrix  Completed?: No.    Education has been provided regarding the importance of this vaccine. Patient has been advised to call insurance company to determine out of pocket expense if they have not yet received this vaccine. Advised may also receive vaccine at local pharmacy or Health Dept. Verbalized acceptance and understanding.  Screening Tests Health Maintenance  Topic Date Due   Medicare Annual Wellness (AWV)  03/08/2024   COVID-19 Vaccine (5 - 2024-25 season) 05/25/2024 (Originally 07/30/2023)   Zoster Vaccines- Shingrix  (1 of 2) 08/09/2024 (Originally 03/16/1970)   OPHTHALMOLOGY EXAM  05/29/2024   INFLUENZA VACCINE  06/28/2024   MAMMOGRAM  07/02/2024   HEMOGLOBIN A1C  08/30/2024   DTaP/Tdap/Td (3 - Tdap) 10/14/2024   Diabetic kidney evaluation - eGFR measurement  02/28/2025   Diabetic kidney evaluation - Urine ACR  03/19/2025   FOOT EXAM  03/19/2025   Colonoscopy  08/08/2029   Pneumococcal Vaccine: 50+ Years  Completed   DEXA SCAN  Completed   Hepatitis C Screening  Completed   HPV VACCINES  Aged Out   Meningococcal B Vaccine  Aged Out    Health Maintenance  Health Maintenance Due  Topic Date Due   Medicare Annual Wellness (  AWV)  03/08/2024    Colorectal cancer screening: Type of screening: Colonoscopy. Completed 08/09/2019. Repeat every 101 years  Mammogram status: Completed 07/03/23. Repeat every year  Bone Density status: Completed  04/19/24. Results reflect: Bone density results: NORMAL. Repeat every 0 years.  Lung Cancer Screening: (Low Dose CT Chest recommended if Age 48-80 years, 20 pack-year currently smoking OR have quit w/in 15years.) does not qualify.   Lung Cancer Screening Referral: n/a  Additional Screening:  Hepatitis C Screening: does qualify; Completed 01/01/13  Vision Screening: Recommended annual ophthalmology exams for early detection of glaucoma and other disorders of the eye. Is the patient up to date with their annual eye exam?  Yes  Who is the provider or what is the name of the office in which the patient attends annual eye exams? [facility] If pt is not established with a provider, would they like to be referred to a provider to establish care? N/a.   Dental Screening: Recommended annual dental exams for proper oral hygiene  Diabetic Foot Exam: Diabetic Foot Exam: Completed 03/19/24  Community Resource Referral / Chronic Care Management: CRR required this visit?  No   CCM required this visit?  No     Plan:     I have personally reviewed and noted the following in the patient's chart:   Medical and social history Use of alcohol, tobacco or illicit drugs  Current medications and supplements including opioid prescriptions. Patient is not currently taking opioid prescriptions. Functional ability and status Nutritional status Physical activity Advanced directives List of other physicians Hospitalizations, surgeries, and ER visits in previous 12 months Vitals Screenings to include cognitive, depression, and falls Referrals and appointments  In addition, I have reviewed and discussed with patient certain preventive protocols, quality metrics, and best practice recommendations. A written personalized care plan for preventive services as well as general preventive health recommendations were provided to patient.     Alexis Anger, CMA   05/10/2024   After Visit Summary: (MyChart) Due to this  being a telephonic visit, the after visit summary with patients personalized plan was offered to patient via MyChart   Nurse Notes:

## 2024-05-12 DIAGNOSIS — G8929 Other chronic pain: Secondary | ICD-10-CM | POA: Insufficient documentation

## 2024-05-12 DIAGNOSIS — M5431 Sciatica, right side: Secondary | ICD-10-CM | POA: Insufficient documentation

## 2024-05-12 NOTE — Assessment & Plan Note (Signed)
 Chronic, sugars are increasing.  She stopped Ozempic  because she was tired of losing weight. States she is still on insulin . Been off 2 wks. Resume 0.25mg . Increase Tresiba  based on BS, on 12 units now.  - patient advised to call in morning blood sugars every Monday (or send via Mychart) - I will adjust insulin  as indicated - Need for dietary compliance was stressed to the patient

## 2024-05-12 NOTE — Assessment & Plan Note (Signed)
 She was given stretching exercises to perform.  - I will send rx tizanidine  to take nightly prn - She will let me know if her sx persist for more than 1-2 weeks

## 2024-05-12 NOTE — Assessment & Plan Note (Signed)
 Chronic, likely due to OA - She agrees to Ortho referral for radiographic studies and further evaluation - Use Voltaren gel topically BID-TID prn

## 2024-05-17 ENCOUNTER — Other Ambulatory Visit: Payer: Self-pay

## 2024-05-17 MED ORDER — SEMAGLUTIDE(0.25 OR 0.5MG/DOS) 2 MG/3ML ~~LOC~~ SOPN: 0.2500 mg | PEN_INJECTOR | SUBCUTANEOUS | 1 refills | Status: DC

## 2024-05-31 ENCOUNTER — Other Ambulatory Visit: Payer: Self-pay | Admitting: Internal Medicine

## 2024-06-05 DIAGNOSIS — H40013 Open angle with borderline findings, low risk, bilateral: Secondary | ICD-10-CM | POA: Diagnosis not present

## 2024-06-05 DIAGNOSIS — E1137X3 Type 2 diabetes mellitus with diabetic macular edema, resolved following treatment, bilateral: Secondary | ICD-10-CM | POA: Diagnosis not present

## 2024-06-05 DIAGNOSIS — H40033 Anatomical narrow angle, bilateral: Secondary | ICD-10-CM | POA: Diagnosis not present

## 2024-06-05 DIAGNOSIS — H2513 Age-related nuclear cataract, bilateral: Secondary | ICD-10-CM | POA: Diagnosis not present

## 2024-06-18 DIAGNOSIS — M25562 Pain in left knee: Secondary | ICD-10-CM | POA: Diagnosis not present

## 2024-06-18 DIAGNOSIS — M25561 Pain in right knee: Secondary | ICD-10-CM | POA: Diagnosis not present

## 2024-06-28 ENCOUNTER — Telehealth: Payer: Self-pay | Admitting: Pharmacist

## 2024-06-28 DIAGNOSIS — N1832 Chronic kidney disease, stage 3b: Secondary | ICD-10-CM

## 2024-06-28 NOTE — Progress Notes (Signed)
 06/28/2024 Name: Michele Wang MRN: [id number] DOB: 03-15-1951  Chief Complaint  Patient presents with   Medication Management    Diabetes    Michele Wang is a 73 y.o. year old female who presented for a telephone visit.   They were referred to the pharmacist by their PCP for assistance in managing diabetes.    Subjective:  Care Team: Primary Care Provider: Jarold Medici, MD ; Next Scheduled Visit: 07/30/2024  Medication Access/Adherence  Current Pharmacy:  [id number] [place], [place] - [address] [place] [address] Phone: [phone] Fax: [phone]  [facility] - [place] - [address] [facility] [address] [place] [address] [place] [address] Phone: [phone] Fax: [phone]  [facility] - [facility] [address] [place] [address] Phone: [phone] Fax: [phone]   Patient reports affordability concerns with their medications: No  Patient reports access/transportation concerns to their pharmacy: No  Patient reports adherence concerns with their medications:  No      Diabetes:  Current medications:  Ozempic  0.25 mg weekly Tresiba  20 units daily Fiasp  -mealtime (not currently using) Uses [facility] but she is not linked with the clinic's [facility] Portal. (She was previously sent an invitation.  She was sent another one today   Current medication access support: Uses [facility]  Hypertension: Olmesartan -Amlodipine -HCTX 40-5-25   Hyperlipidemia/ASCVD Risk Reduction  Current lipid lowering medications:  Atorvastatin  40 mg -Patient said she is taking this but it is not listed in Dr. Annemarie. She uses [facility]     Clinical ASCVD: Yes  The ASCVD Risk score (Arnett DK, et al., 2019) failed to calculate for the following reasons:   Risk score cannot be calculated because patient has a medical history suggesting prior/existing ASCVD      Objective:  Lab  Results  Component Value Date   HGBA1C 7.1 (H) 02/29/2024    Lab Results  Component Value Date   CREATININE 1.67 (H) 02/29/2024   BUN 27 02/29/2024   NA 137 02/29/2024   K 3.4 (L) 02/29/2024   CL 98 02/29/2024   CO2 23 02/29/2024    Lab Results  Component Value Date   CHOL 170 02/29/2024   HDL 56 02/29/2024   LDLCALC 80 02/29/2024   TRIG 204 (H) 02/29/2024   CHOLHDL 3.0 02/29/2024    Medications Reviewed Today     Reviewed by Jolee Cassius PARAS, RPH (Pharmacist) on 06/28/24 at 1635  Med List Status: <None>   Medication Order Taking? Sig Documenting Provider Last Dose Status Informant  atorvastatin  (LIPITOR) 40 MG tablet [id number]  TAKE ONE TABLET BY MOUTH THREE TIMES A WEEK ON MONDAY, WEDNESDAY AND FRIDAY Sanders, Robyn, MD  Active   Continuous Glucose Sensor (FREESTYLE LIBRE 3 PLUS SENSOR) MISC [id number]  Change sensor every 15 days. Jarold Medici, MD  Active   insulin  aspart (FIASP ) 100 UNIT/ML FlexTouch Pen [id number]  Inject  insulin  before meals and at hs if BS < 150 - 0 units, 150-199 - 3 units, 200-249 - 6 units, 250-299 - 9 units, 300-349 - 12 units, >350 - 15 units. If >400 call our office with your blood sugar readings.  Patient not taking: Reported on 06/28/2024   Jarold Medici, MD  Active   insulin  degludec (TRESIBA  FLEXTOUCH) 100 UNIT/ML FlexTouch Pen [id number] Yes INJECT 20 UNITS SUBCUTANEOUSLY EVERY DAY - (DISCARD EACH PEN 56 DAYS AFTER THE FIRST USE) Jarold Medici, MD  Active   Olmesartan -amLODIPine -HCTZ 40-5-25  MG TABS [id number] Yes Take 1 tablet by mouth daily. Jarold Medici, MD  Active   Semaglutide ,0.25 or 0.5MG /DOS, 2 MG/3ML NELMA [id number] Yes Inject 0.25 mg into the skin once a week. Jarold Medici, MD  Active   tamoxifen  (NOLVADEX ) 20 MG tablet [id number] Yes Take 1 tablet (20 mg total) by mouth daily. Gudena, Vinay, MD  Active   tiZANidine  (ZANAFLEX ) 4 MG tablet [id number] Yes Take 1 tablet (4 mg total) by mouth at bedtime as needed for muscle spasms.  Jarold Medici, MD  Active               Assessment/Plan:   Diabetes: - Currently controlled A1c 7.1% - Consider titrating Ozempic  dose at upcoming visit  -Recommend to accept the application sent to her via [facility]  Hypertension:    05/10/2024    8:36 AM 05/09/2024    8:35 AM 03/19/2024   11:21 AM  Vitals with BMI  Height -- 5' 6 5' 6  Weight -- 153 lbs 6 oz 148 lbs 13 oz  BMI  24.77 24.03  Systolic -- 122 110  Diastolic -- 70 80  Pulse -- 77 81    - Currently controlled   - Recommend to continue Olmesartan -amlodipine -HCTZ   Hyperlipidemia/ASCVD Risk Reduction: - Currently uncontrolled. LDL 80 TG-204 - Recommend to verify if she is still taking atorvastatin . Since she has her meds billed through [facility], the fill history is not in Dr. Annemarie.  -Reassess after upcoming PCP appointment.     Follow Up Plan:    Will follow up in 2 weeks to see if Patient was able to link her [facility] with our [facility] Portal.   Cassius DOROTHA Brought, PharmD, [facility] Clinical Pharmacist [phone]

## 2024-07-10 DIAGNOSIS — Z1231 Encounter for screening mammogram for malignant neoplasm of breast: Secondary | ICD-10-CM | POA: Diagnosis not present

## 2024-07-10 LAB — HM MAMMOGRAPHY

## 2024-07-23 ENCOUNTER — Inpatient Hospital Stay: Attending: Hematology and Oncology | Admitting: Hematology and Oncology

## 2024-07-23 DIAGNOSIS — H43813 Vitreous degeneration, bilateral: Secondary | ICD-10-CM | POA: Diagnosis not present

## 2024-07-23 DIAGNOSIS — E113492 Type 2 diabetes mellitus with severe nonproliferative diabetic retinopathy without macular edema, left eye: Secondary | ICD-10-CM | POA: Diagnosis not present

## 2024-07-23 DIAGNOSIS — H2513 Age-related nuclear cataract, bilateral: Secondary | ICD-10-CM | POA: Diagnosis not present

## 2024-07-23 LAB — HM DIABETES EYE EXAM

## 2024-07-23 NOTE — Assessment & Plan Note (Deleted)
 08/21/2019: Left lumpectomy Dr. 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  Adjuvant radiation therapy 09/17/2019-10/07/2019 2.  Followed by tamoxifen  for 5 years 10/08/2019-10/07/2024 -------------------------------------------------------------------------------------------------------------------------------------------- Tamoxifen  toxicities: Intermittent hot flashes but bearable   Breast cancer surveillance: 1.  Breast exam 07/23/2024: Benign 2. Mammogram 07/10/2024: [facility]: Benign, breast density category B   Bone density 02/27/2020: T score -1.3: Mild osteopenia I renewed her prescription for tamoxifen  through [place]   Return to clinic on an as-needed basis

## 2024-07-30 ENCOUNTER — Ambulatory Visit: Admitting: Internal Medicine

## 2024-07-30 ENCOUNTER — Encounter: Payer: Self-pay | Admitting: Internal Medicine

## 2024-07-30 VITALS — BP 110/78 | HR 79 | Temp 98.1°F | Ht 66.0 in | Wt 158.0 lb

## 2024-07-30 DIAGNOSIS — E1122 Type 2 diabetes mellitus with diabetic chronic kidney disease: Secondary | ICD-10-CM | POA: Diagnosis not present

## 2024-07-30 DIAGNOSIS — N1832 Chronic kidney disease, stage 3b: Secondary | ICD-10-CM | POA: Diagnosis not present

## 2024-07-30 DIAGNOSIS — M170 Bilateral primary osteoarthritis of knee: Secondary | ICD-10-CM | POA: Diagnosis not present

## 2024-07-30 DIAGNOSIS — I1310 Hypertensive heart and chronic kidney disease without heart failure, with stage 1 through stage 4 chronic kidney disease, or unspecified chronic kidney disease: Secondary | ICD-10-CM

## 2024-07-30 DIAGNOSIS — E1136 Type 2 diabetes mellitus with diabetic cataract: Secondary | ICD-10-CM

## 2024-07-30 DIAGNOSIS — E782 Mixed hyperlipidemia: Secondary | ICD-10-CM | POA: Diagnosis not present

## 2024-07-30 DIAGNOSIS — I700 Atherosclerosis of aorta: Secondary | ICD-10-CM | POA: Diagnosis not present

## 2024-07-30 MED ORDER — ATORVASTATIN CALCIUM 40 MG PO TABS: ORAL_TABLET | ORAL | 3 refills | Status: AC

## 2024-07-30 NOTE — Assessment & Plan Note (Signed)
 Diabetes well-controlled with A1c estimated at 6.1-6.2%. Good glycemic control with 94% in range. Diabetic CKD present, no nephrology follow-up since 2022. UTD with eye exam, she has been advised to have cataract surgery.  - Order A1c test today. - Schedule follow-up appointment for early January to check A1c before cataract procedure. - Continue Tresiba  12 units at night. - Continue low-dose Ozempic . - Refer to kidney specialist for follow-up.

## 2024-07-30 NOTE — Patient Instructions (Signed)

## 2024-07-30 NOTE — Progress Notes (Signed)
 I,Victoria T Emmitt, CMA,acting as a Neurosurgeon for [name], MD.,have documented all relevant documentation on the behalf of [name], MD,as directed by  [name], MD while in the presence of [name], MD.  Subjective:  Patient ID: Michele Wang , female    DOB: December 25, 1950 , 73 y.o.   MRN: [id number]  Chief Complaint  Patient presents with  . Diabetes    Patient presents today for dm, bp & cholesterol follow up. She reports compliance with medications. Denies headache, chest pain & sob.   Michele Wang Hypertension  . Hyperlipidemia    HPI Discussed the use of AI scribe software for clinical note transcription with the patient, who gave verbal consent to proceed.  History of Present Illness Michele Wang is a 73 year old female with diabetes and hypertension who presents for a routine follow-up visit.  Her blood sugar levels have been stable, with recent readings ranging from 89 to 100 mg/dL. She uses Tresiba , adjusted to 12 units at night, and a low dose of Ozempic . She has gained 10 pounds since April. Her A1c is estimated to be between 6.1 and 6.2 based on her Libre readings, with 94% of her readings in range.  She continues to take atorvastatin  40 mg on Monday, Wednesday, and Friday, and a combination of olmesartan , amlodipine , and hydrochlorothiazide  (40/25 mg).  She has a history of cataracts and plans to address this in January. She had an eye exam recently with Dr. Elner.  She has a history of arthritis in both knees, diagnosed by a previous doctor. She has not received injections and prefers to manage with exercise and over-the-counter Voltaren gel.  She has not seen her kidney specialist since 2022, when she was advised to return every six months. She is unsure why she stopped attending these appointments.  She has a history of a CT scan in 2021 showing plaque in the aorta. She has previously consulted a vein specialist but did not pursue treatment as her symptoms  were not bothersome at the time.  She has given up drinking Pepsi as part of her dietary changes.  No bladder issues.   Hypertension This is a chronic problem. The current episode started more than 1 year ago. The problem has been gradually improving since onset. The problem is controlled. Pertinent negatives include no blurred vision, chest pain, headaches, orthopnea, palpitations or shortness of breath. Risk factors for coronary artery disease include diabetes mellitus, dyslipidemia, post-menopausal state and sedentary lifestyle. The current treatment provides moderate improvement. Compliance problems include exercise.  Hypertensive end-organ damage includes kidney disease. Identifiable causes of hypertension include chronic renal disease.  Diabetes She presents for her follow-up diabetic visit. She has type 2 diabetes mellitus. There are no hypoglycemic associated symptoms. Pertinent negatives for hypoglycemia include no confusion, dizziness, headaches or nervousness/anxiousness. There are no diabetic associated symptoms. Pertinent negatives for diabetes include no blurred vision, no chest pain, no fatigue, no polydipsia, no polyphagia, no polyuria and no weakness. There are no hypoglycemic complications. Diabetic complications include nephropathy. Risk factors for coronary artery disease include hypertension and diabetes mellitus. She is following a diabetic diet. She participates in exercise intermittently. (Blood sugars 180-190 in the last day. )  Hyperlipidemia This is a chronic problem. The current episode started more than 1 year ago. Exacerbating diseases include chronic renal disease and diabetes. Pertinent negatives include no chest pain or shortness of breath.     Past Medical History:  Diagnosis Date  .  Breast cancer (HCC)   . CKD (chronic kidney disease)   . Diabetes mellitus without complication (HCC)    Type II  . Hyperlipidemia   . Hypertension   . UTI (urinary tract  infection)   . Vitreomacular adhesion of left eye 05/19/2020     Family History  Problem Relation Age of Onset  . Aneurysm Mother   . Healthy Father      Current Outpatient Medications:  .  Continuous Glucose Sensor (FREESTYLE LIBRE 3 PLUS SENSOR) MISC, Change sensor every 15 days., Disp: 6 each, Rfl: 3 .  insulin  degludec (TRESIBA  FLEXTOUCH) 100 UNIT/ML FlexTouch Pen, INJECT 20 UNITS SUBCUTANEOUSLY EVERY DAY - (DISCARD EACH PEN 56 DAYS AFTER THE FIRST USE), Disp: 5 mL, Rfl: 3 .  Olmesartan -amLODIPine -HCTZ 40-5-25 MG TABS, Take 1 tablet by mouth daily., Disp: 90 tablet, Rfl: 2 .  Semaglutide ,0.25 or 0.5MG /DOS, 2 MG/3ML SOPN, Inject 0.25 mg into the skin once a week., Disp: 3 mL, Rfl: 1 .  tamoxifen  (NOLVADEX ) 20 MG tablet, Take 1 tablet (20 mg total) by mouth daily., Disp: 90 tablet, Rfl: 3 .  tiZANidine  (ZANAFLEX ) 4 MG tablet, Take 1 tablet (4 mg total) by mouth at bedtime as needed for muscle spasms., Disp: 30 tablet, Rfl: 1 .  atorvastatin  (LIPITOR) 40 MG tablet, TAKE ONE TABLET BY MOUTH 4 TIMES A WEEK ON MONDAY, WEDNESDAY, FRIDAY and Saturday, Disp: 60 tablet, Rfl: 3 .  insulin  aspart (FIASP ) 100 UNIT/ML FlexTouch Pen, Inject [place] insulin  before meals and at hs if BS < 150 - 0 units, 150-199 - 3 units, 200-249 - 6 units, 250-299 - 9 units, 300-349 - 12 units, >350 - 15 units. If >400 call our office with your blood sugar readings. (Patient not taking: Reported on 07/30/2024), Disp: 45 mL, Rfl: 3   No Known Allergies   Review of Systems  Constitutional: Negative.  Negative for fatigue.  Eyes:  Negative for blurred vision.  Respiratory: Negative.  Negative for shortness of breath.   Cardiovascular: Negative.  Negative for chest pain, palpitations and orthopnea.  Gastrointestinal: Negative.   Endocrine: Negative for polydipsia, polyphagia and polyuria.  Neurological: Negative.  Negative for dizziness, weakness and headaches.  Psychiatric/Behavioral: Negative.  Negative for confusion. The  patient is not nervous/anxious.      Today's Vitals   07/30/24 1129  BP: 110/78  Pulse: 79  Temp: 98.1 F (36.7 C)  SpO2: 98%  Weight: 158 lb (71.7 kg)  Height: 5' 6 (1.676 m)   Body mass index is 25.5 kg/m.  Wt Readings from Last 3 Encounters:  07/30/24 158 lb (71.7 kg)  05/09/24 153 lb 6.4 oz (69.6 kg)  03/19/24 148 lb 12.8 oz (67.5 kg)     Objective:  Physical Exam Vitals and nursing note reviewed.  Constitutional:      Appearance: Normal appearance.  HENT:     Head: Normocephalic and atraumatic.  Eyes:     Extraocular Movements: Extraocular movements intact.  Cardiovascular:     Rate and Rhythm: Normal rate and regular rhythm.     Heart sounds: Normal heart sounds.  Pulmonary:     Effort: Pulmonary effort is normal.     Breath sounds: Normal breath sounds.  Musculoskeletal:     Cervical back: Normal range of motion.  Skin:    General: Skin is warm.  Neurological:     General: No focal deficit present.     Mental Status: She is alert.  Psychiatric:  Mood and Affect: Mood normal.        Behavior: Behavior normal.         Assessment And Plan:  Type 2 diabetes mellitus with stage 3b chronic kidney disease, without long-term current use of insulin  (HCC) Assessment & Plan: Diabetes well-controlled with A1c estimated at 6.1-6.2%. Good glycemic control with 94% in range. Diabetic CKD present, no nephrology follow-up since 2022. UTD with eye exam, she has been advised to have cataract surgery.  - Order A1c test today. - Schedule follow-up appointment for early January to check A1c before cataract procedure. - Continue Tresiba  12 units at night. - Continue low-dose Ozempic . - Refer to kidney specialist for follow-up.  Orders: -     CBC -     CMP14+EGFR -     Hemoglobin A1c -     PTH, intact and calcium  -     Phosphorus -     Protein electrophoresis, serum -     Iron, TIBC and Ferritin Panel  DM cataract (HCC) Assessment & Plan: Chronic,  diagnosed by Ophthalmology.  She plans to move forward with surgery in January 2025.    Hypertensive heart and renal disease with renal failure, stage 1 through stage 4 or unspecified chronic kidney disease, without heart failure Assessment & Plan: Chronic, well controlled.  She is currently on olmesartan , amlodipine , and hydrochlorothiazide . CKD present, no documented nephrology follow-up since 2022. - Continue olmesartan , amlodipine , and hydrochlorothiazide . - Refer to kidney specialist for follow-up.  Orders: -     CMP14+EGFR  Aortic atherosclerosis (HCC) Assessment & Plan: Chronic, LDL goal is less than 70. She will c/w  statin therapy. Will add ASA 81mg  to her current regimen.    Mixed hyperlipidemia Assessment & Plan: Chronic, LDL goal is less than 70.  Managed with atorvastatin  40 mg on Monday, Wednesday, and Friday. - Last LDL was greater than 70.  Will increase dosing to four days weekly.    Bilateral primary osteoarthritis of knee Assessment & Plan: Diagnosed previously, no surgical intervention needed. Not interested in cortisone injections. Not using Voltaren gel. - Recommend over-the-counter Voltaren gel for knee inflammation.   Other orders -     Atorvastatin  Calcium ; TAKE ONE TABLET BY MOUTH 4 TIMES A WEEK ON MONDAY, WEDNESDAY, FRIDAY and Saturday  Dispense: 60 tablet; Refill: 3   Return in 4 weeks (on 08/27/2024), or [place] - flu shot, for 4 month dm f/u.Michele Wang  Patient was given opportunity to ask questions. Patient verbalized understanding of the plan and was able to repeat key elements of the plan. All questions were answered to their satisfaction.   I, [name], MD, have reviewed all documentation for this visit. The documentation on 07/30/24 for the exam, diagnosis, procedures, and orders are all accurate and complete.   IF YOU HAVE BEEN REFERRED TO A SPECIALIST, IT MAY TAKE 1-2 WEEKS TO SCHEDULE/PROCESS THE REFERRAL. IF YOU HAVE NOT HEARD FROM US /SPECIALIST IN  TWO WEEKS, PLEASE GIVE US  A CALL AT [phone].   THE PATIENT IS ENCOURAGED TO PRACTICE SOCIAL DISTANCING DUE TO THE COVID-19 PANDEMIC.

## 2024-07-30 NOTE — Assessment & Plan Note (Signed)
 Chronic, LDL goal is less than 70. She will c/w  statin therapy. Will add ASA 81mg  to her current regimen.

## 2024-07-30 NOTE — Assessment & Plan Note (Signed)
 Chronic, diagnosed by Ophthalmology.  She plans to move forward with surgery in January 2025.

## 2024-07-30 NOTE — Assessment & Plan Note (Signed)
 Chronic, LDL goal is less than 70.  Managed with atorvastatin  40 mg on Monday, Wednesday, and Friday. - Last LDL was greater than 70.  Will increase dosing to four days weekly.

## 2024-07-30 NOTE — Assessment & Plan Note (Signed)
 Chronic, well controlled.  She is currently on olmesartan , amlodipine , and hydrochlorothiazide . CKD present, no documented nephrology follow-up since 2022. - Continue olmesartan , amlodipine , and hydrochlorothiazide . - Refer to kidney specialist for follow-up.

## 2024-07-30 NOTE — Assessment & Plan Note (Signed)
 Diagnosed previously, no surgical intervention needed. Not interested in cortisone injections. Not using Voltaren gel. - Recommend over-the-counter Voltaren gel for knee inflammation.

## 2024-07-31 ENCOUNTER — Ambulatory Visit: Payer: Self-pay | Admitting: Internal Medicine

## 2024-07-31 LAB — IRON,TIBC AND FERRITIN PANEL
Ferritin: 267 ng/mL — ABNORMAL HIGH (ref 15–150)
Iron Saturation: 20 % (ref 15–55)
Iron: 60 ug/dL (ref 27–139)
Total Iron Binding Capacity: 301 ug/dL (ref 250–450)
UIBC: 241 ug/dL (ref 118–369)

## 2024-08-01 LAB — PROTEIN ELECTROPHORESIS, SERUM
A/G Ratio: 1.1 (ref 0.7–1.7)
Albumin ELP: 3.8 g/dL (ref 2.9–4.4)
Alpha 1: 0.2 g/dL (ref 0.0–0.4)
Alpha 2: 0.7 g/dL (ref 0.4–1.0)
Beta: 1.1 g/dL (ref 0.7–1.3)
Gamma Globulin: 1.5 g/dL (ref 0.4–1.8)
Globulin, Total: 3.5 g/dL (ref 2.2–3.9)

## 2024-08-01 LAB — CMP14+EGFR
ALT: 19 IU/L (ref 0–32)
AST: 23 IU/L (ref 0–40)
Albumin: 4.3 g/dL (ref 3.8–4.8)
Alkaline Phosphatase: 77 IU/L (ref 44–121)
BUN/Creatinine Ratio: 18 (ref 12–28)
BUN: 28 mg/dL — ABNORMAL HIGH (ref 8–27)
Bilirubin Total: 0.4 mg/dL (ref 0.0–1.2)
CO2: 24 mmol/L (ref 20–29)
Calcium: 10.1 mg/dL (ref 8.7–10.3)
Chloride: 100 mmol/L (ref 96–106)
Creatinine, Ser: 1.53 mg/dL — ABNORMAL HIGH (ref 0.57–1.00)
Globulin, Total: 3 g/dL (ref 1.5–4.5)
Glucose: 100 mg/dL — ABNORMAL HIGH (ref 70–99)
Potassium: 4 mmol/L (ref 3.5–5.2)
Sodium: 139 mmol/L (ref 134–144)
Total Protein: 7.3 g/dL (ref 6.0–8.5)
eGFR: 36 mL/min/1.73 — ABNORMAL LOW (ref >59)

## 2024-08-01 LAB — CBC
Hematocrit: 38.3 % (ref 34.0–46.6)
Hemoglobin: 12.1 g/dL (ref 11.1–15.9)
MCH: 29.5 pg (ref 26.6–33.0)
MCHC: 31.6 g/dL (ref 31.5–35.7)
MCV: 93 fL (ref 79–97)
Platelets: 263 10*3/uL (ref 150–450)
RBC: 4.1 x10E6/uL (ref 3.77–5.28)
RDW: 13.2 % (ref 11.7–15.4)
WBC: 6.8 10*3/uL (ref 3.4–10.8)

## 2024-08-01 LAB — PTH, INTACT AND CALCIUM: PTH: 55 pg/mL (ref 15–65)

## 2024-08-01 LAB — PHOSPHORUS: Phosphorus: 3.5 mg/dL (ref 3.0–4.3)

## 2024-08-01 LAB — HEMOGLOBIN A1C
Est. average glucose Bld gHb Est-mCnc: 157 mg/dL
Hgb A1c MFr Bld: 7.1 % — ABNORMAL HIGH (ref 4.8–5.6)

## 2024-08-09 ENCOUNTER — Other Ambulatory Visit: Payer: Self-pay | Admitting: Internal Medicine

## 2024-08-14 ENCOUNTER — Other Ambulatory Visit: Payer: Self-pay | Admitting: Internal Medicine

## 2024-08-27 ENCOUNTER — Ambulatory Visit (INDEPENDENT_AMBULATORY_CARE_PROVIDER_SITE_OTHER)

## 2024-08-27 VITALS — BP 120/74 | HR 74 | Temp 98.5°F | Ht 66.0 in | Wt 158.0 lb

## 2024-08-27 DIAGNOSIS — Z23 Encounter for immunization: Secondary | ICD-10-CM

## 2024-08-27 NOTE — Progress Notes (Signed)
 Patient presents today for a flu vaccine. Patient received injection in her right deltoid. Patient tolerated injection well. YL,RMA

## 2024-09-05 DIAGNOSIS — M25561 Pain in right knee: Secondary | ICD-10-CM | POA: Diagnosis not present

## 2024-09-06 ENCOUNTER — Other Ambulatory Visit: Payer: Self-pay | Admitting: Family Medicine

## 2024-09-06 DIAGNOSIS — E1165 Type 2 diabetes mellitus with hyperglycemia: Secondary | ICD-10-CM

## 2024-09-10 ENCOUNTER — Other Ambulatory Visit: Payer: Self-pay | Admitting: Internal Medicine

## 2024-09-10 DIAGNOSIS — Z794 Long term (current) use of insulin: Secondary | ICD-10-CM

## 2024-09-10 NOTE — Telephone Encounter (Unsigned)
 Copied from CRM [id number]. Topic: Clinical - Prescription Issue >> Sep 10, 2024  3:23 PM Wess RAMAN wrote: Reason for CRM: Patient would like a 90 day supply of insulin  degludec (TRESIBA  FLEXTOUCH) 100 UNIT/ML FlexTouch Pen  Callback #: [id number]  Pharmacy: [facility] - [place], [place] - [address] [facility] [address] [place] [address] [place] [address] Phone: [phone] Fax: [phone] Hours: Not open 24 hours

## 2024-09-10 NOTE — Telephone Encounter (Signed)
 Copied from CRM [id number]. Topic: Clinical - Medication Refill >> Sep 10, 2024  3:21 PM Wess RAMAN wrote: Medication: insulin  aspart (FIASP ) 100 UNIT/ML FlexTouch Pen   Has the patient contacted their pharmacy? No (Agent: If no, request that the patient contact the pharmacy for the refill. If patient does not wish to contact the pharmacy document the reason why and proceed with request.) (Agent: If yes, when and what did the pharmacy advise?)  This is the patient's preferred pharmacy:   [facility] - [place], [place] - [address] [facility] [address] [place] [address] [place] [address] Phone: [phone] Fax: [phone]  Is this the correct pharmacy for this prescription? Yes If no, delete pharmacy and type the correct one.   Has the prescription been filled recently? Yes  Is the patient out of the medication? Yes  Has the patient been seen for an appointment in the last year OR does the patient have an upcoming appointment? Yes  Can we respond through MyChart? Yes  Agent: Please be advised that Rx refills may take up to 3 business days. We ask that you follow-up with your pharmacy.

## 2024-09-11 MED ORDER — INSULIN ASPART (W/NIACINAMIDE) 100 UNIT/ML ~~LOC~~ SOPN: PEN_INJECTOR | SUBCUTANEOUS | 3 refills | Status: AC

## 2024-12-03 ENCOUNTER — Encounter: Payer: Self-pay | Admitting: Internal Medicine

## 2024-12-03 ENCOUNTER — Ambulatory Visit (INDEPENDENT_AMBULATORY_CARE_PROVIDER_SITE_OTHER): Admitting: Internal Medicine

## 2024-12-03 VITALS — BP 122/80 | HR 73 | Temp 98.3°F | Ht 66.0 in | Wt 164.2 lb

## 2024-12-03 DIAGNOSIS — I1310 Hypertensive heart and chronic kidney disease without heart failure, with stage 1 through stage 4 chronic kidney disease, or unspecified chronic kidney disease: Secondary | ICD-10-CM | POA: Diagnosis not present

## 2024-12-03 DIAGNOSIS — E785 Hyperlipidemia, unspecified: Secondary | ICD-10-CM

## 2024-12-03 DIAGNOSIS — E1122 Type 2 diabetes mellitus with diabetic chronic kidney disease: Secondary | ICD-10-CM

## 2024-12-03 DIAGNOSIS — N1832 Chronic kidney disease, stage 3b: Secondary | ICD-10-CM | POA: Diagnosis not present

## 2024-12-03 DIAGNOSIS — E1169 Type 2 diabetes mellitus with other specified complication: Secondary | ICD-10-CM | POA: Diagnosis not present

## 2024-12-03 DIAGNOSIS — Z853 Personal history of malignant neoplasm of breast: Secondary | ICD-10-CM | POA: Diagnosis not present

## 2024-12-03 DIAGNOSIS — I700 Atherosclerosis of aorta: Secondary | ICD-10-CM | POA: Diagnosis not present

## 2024-12-03 NOTE — Assessment & Plan Note (Signed)
 Chronic, LDL goal is less than 70. She will c/w  statin therapy. Will add ASA 81mg  to her current regimen.

## 2024-12-03 NOTE — Assessment & Plan Note (Signed)
 Chronic, kidney function has been stable.  - Chronic, she is encouraged to stay well hydrated, avoid NSAIDs and keep BP controlled to prevent progression of CKD.

## 2024-12-03 NOTE — Patient Instructions (Signed)

## 2024-12-03 NOTE — Progress Notes (Signed)
 I,Victoria T Emmitt, CMA,acting as a neurosurgeon for Michele LOISE Slocumb, MD.,have documented all relevant documentation on the behalf of Michele LOISE Slocumb, MD,as directed by  Michele LOISE Slocumb, MD while in the presence of Michele LOISE Slocumb, MD.  Subjective:  Patient ID: Michele Wang , female    DOB: 01-31-1951 , 74 y.o.   MRN: [id number]  Chief Complaint  Patient presents with   Diabetes    Patient presents today for dm, bp & chol follow up. She reports compliance with medications. Denies headache, chest pain & sob.   Hypertension   Hyperlipidemia    HPI Discussed the use of AI scribe software for clinical note transcription with the patient, who gave verbal consent to proceed.  History of Present Illness Michele Wang is a 74 year old female with diabetes who presents for a diabetes check.  Her blood sugar levels have been fluctuating, with readings ranging from 90 to 125 mg/dL. Blood sugar tends to rise after eating but returns to normal levels. Her A1c over the past 90 days is 6.5%, with 97% of her readings in range. She is currently taking Tresiba  at 22 units at night, although she was previously instructed to take 12 units. She also takes Ozempic  once a week. She has been consuming sugary beverages like Pepsi and Dr. Nunzio, which may be affecting her blood sugar levels.  She is currently taking atorvastatin  on Monday, Wednesday, Friday, and Saturday, and has not been using Fiasp  recently. She also takes olmesartan , amlodipine , and HCTZ daily for blood pressure management.  She was diagnosed with breast cancer in 2020 and underwent a lumpectomy in September 2020. She is no longer taking tamoxifen , as she reports she was only supposed to take it for five years. Her last oncology visit was in August 2024. She had a mammogram and bone density test in May 2025.  She has not seen her kidney specialist in a while, as she was told she did not need to return unless there was an issue.   Hypertension This  is a chronic problem. The problem has been gradually improving since onset. Pertinent negatives include no chest pain, headaches, palpitations or shortness of breath. Past treatments include calcium  channel blockers, diuretics and angiotensin blockers. The current treatment provides moderate improvement. Hypertensive end-organ damage includes kidney disease.  Diabetes She presents for her follow-up diabetic visit. She has type 2 diabetes mellitus. There are no hypoglycemic associated symptoms. Pertinent negatives for hypoglycemia include no confusion, dizziness, headaches or nervousness/anxiousness. There are no diabetic associated symptoms. Pertinent negatives for diabetes include no chest pain, no fatigue, no polydipsia, no polyphagia, no polyuria and no weakness. Risk factors for coronary artery disease include hypertension and diabetes mellitus. She is following a diabetic diet. (Blood sugars 180-190 in the last day. 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     Family History  Problem Relation Age of Onset   Aneurysm Mother    Healthy Father      Current Outpatient Medications:    atorvastatin  (LIPITOR) 40 MG tablet, TAKE ONE TABLET BY MOUTH 4 TIMES A WEEK ON MONDAY, WEDNESDAY, FRIDAY and Saturday, Disp: 60 tablet, Rfl: 3   Continuous Glucose  Sensor (FREESTYLE LIBRE 3 PLUS SENSOR) MISC, Change sensor every 15 days., Disp: 6 each, Rfl: 3   insulin  aspart (FIASP ) 100 UNIT/ML FlexTouch Pen, Inject [place] insulin  before meals and at hs if BS < 150 - 0 units, 150-199 - 3 units, 200-249 - 6 units, 250-299 - 9 units, 300-349 - 12 units, >350 - 15 units. If >400 call our office with your blood sugar readings., Disp: 45 mL, Rfl: 3   insulin  degludec (TRESIBA  FLEXTOUCH)  100 UNIT/ML FlexTouch Pen, INJECT 20 UNITS SUBCUTANEOUSLY EVERY DAY - (DISCARD EACH PEN 56 DAYS AFTER THE FIRST USE), Disp: 5 mL, Rfl: 3   Olmesartan -amLODIPine -HCTZ 40-5-25 MG TABS, Take 1 tablet by mouth daily., Disp: 90 tablet, Rfl: 2   OZEMPIC , 0.25 OR 0.5 MG/DOSE, 2 MG/3ML SOPN, INJECT 0.5 MG SUBCUTANEOUSLY ONCE A WEEK, Disp: 9 mL, Rfl: 2   tamoxifen  (NOLVADEX ) 20 MG tablet, Take 1 tablet (20 mg total) by mouth daily., Disp: 90 tablet, Rfl: 3   tiZANidine  (ZANAFLEX ) 4 MG tablet, Take 1 tablet (4 mg total) by mouth at bedtime as needed for muscle spasms., Disp: 30 tablet, Rfl: 1   No Known Allergies   Review of Systems  Constitutional: Negative.  Negative for fatigue.  Respiratory: Negative.  Negative for shortness of breath.   Cardiovascular: Negative.  Negative for chest pain and palpitations.  Gastrointestinal: Negative.   Endocrine: Negative for polydipsia, polyphagia and polyuria.  Neurological: Negative.  Negative for dizziness, weakness and headaches.  Psychiatric/Behavioral: Negative.  Negative for confusion. The patient is not nervous/anxious.      Today's Vitals   12/03/24 1113  BP: 122/80  Pulse: 73  Temp: 98.3 F (36.8 C)  SpO2: 98%  Weight: 164 lb 3.2 oz (74.5 kg)  Height: 5' 6 (1.676 m)   Body mass index is 26.5 kg/m.  Wt Readings from Last 3 Encounters:  12/03/24 164 lb 3.2 oz (74.5 kg)  08/27/24 158 lb (71.7 kg)  07/30/24 158 lb (71.7 kg)    The ASCVD Risk score (Arnett DK, et al., 2019) failed to calculate for the following reasons:   Risk score cannot be calculated because patient has a medical history suggesting prior/existing ASCVD   * - Cholesterol units were assumed  Objective:  Physical Exam Vitals and nursing note reviewed.  Constitutional:      Appearance: Normal appearance.  HENT:     Head: Normocephalic and atraumatic.  Eyes:     Extraocular Movements: Extraocular movements intact.  Cardiovascular:     Rate and Rhythm: Normal rate and  regular rhythm.     Heart sounds: Normal heart sounds.  Pulmonary:     Effort: Pulmonary effort is normal.     Breath sounds: Normal breath sounds.  Abdominal:     General: Abdomen is flat.  Musculoskeletal:     Cervical back: Normal range of motion.  Skin:    General: Skin is warm.  Neurological:     General: No focal deficit present.     Mental Status: She is alert.  Psychiatric:        Mood and Affect: Mood normal.        Behavior: Behavior normal.         Assessment And Plan:   Assessment & Plan Dyslipidemia associated with type 2 diabetes mellitus (HCC) Blood glucose well-controlled with A1c 6.5% and 97% time in range. Current regimen effective. Proper Ozempic  administration confirmed despite not hearing click. Dr. Nunzio consumption may affect insulin  needs. - Continue Tresiba   22 units at night. - Continue Ozempic  weekly. - Educated on proper Ozempic  administration technique, including removing the cap before injection and listening for the click. - Ordered blood count, liver, and kidney function tests. Hypertensive heart and renal disease with renal failure, stage 1 through stage 4 or unspecified chronic kidney disease, without heart failure Chronic, well controlled. Blood pressure managed with olmesartan , amlodipine , and HCTZ. No recent nephrology follow-up needed unless issues arise. - Continue current antihypertensive regimen. Aortic atherosclerosis Chronic, LDL goal is less than 70. She will c/w  statin therapy. Will add ASA 81mg  to her current regimen.  Type 2 diabetes mellitus with stage 3b chronic kidney disease, without long-term current use of insulin  (HCC) Chronic, kidney function has been stable.  - Chronic, she is encouraged to stay well hydrated, avoid NSAIDs and keep BP controlled to prevent progression of CKD.   History of breast cancer She was diagnosed in 2020, no longer on tamoxifen . She is s/p lumpectomy and XRT.  Last Oncology follow up in August  2024.   Orders Placed This Encounter  Procedures   CBC   CMP14+EGFR   Lipid panel   Hemoglobin A1c   Return if symptoms worsen or fail to improve.  Patient was given opportunity to ask questions. Patient verbalized understanding of the plan and was able to repeat key elements of the plan. All questions were answered to their satisfaction.   I, Michele LOISE Slocumb, MD, have reviewed all documentation for this visit. The documentation on 12/03/2024 for the exam, diagnosis, procedures, and orders are all accurate and complete.   IF YOU HAVE BEEN REFERRED TO A SPECIALIST, IT MAY TAKE 1-2 WEEKS TO SCHEDULE/PROCESS THE REFERRAL. IF YOU HAVE NOT HEARD FROM US /SPECIALIST IN TWO WEEKS, PLEASE GIVE US  A CALL AT [phone].

## 2024-12-03 NOTE — Assessment & Plan Note (Signed)
 Chronic, well controlled. Blood pressure managed with olmesartan , amlodipine , and HCTZ. No recent nephrology follow-up needed unless issues arise. - Continue current antihypertensive regimen.

## 2024-12-04 LAB — CMP14+EGFR
ALT: 21 IU/L (ref 0–32)
AST: 20 IU/L (ref 0–40)
Albumin: 4.5 g/dL (ref 3.8–4.8)
Alkaline Phosphatase: 82 IU/L (ref 49–135)
BUN/Creatinine Ratio: 15 (ref 12–28)
BUN: 23 mg/dL (ref 8–27)
Bilirubin Total: 0.5 mg/dL (ref 0.0–1.2)
CO2: 25 mmol/L (ref 20–29)
Calcium: 9.7 mg/dL (ref 8.7–10.3)
Chloride: 102 mmol/L (ref 96–106)
Creatinine, Ser: 1.56 mg/dL — ABNORMAL HIGH (ref 0.57–1.00)
Globulin, Total: 2.7 g/dL (ref 1.5–4.5)
Glucose: 110 mg/dL — ABNORMAL HIGH (ref 70–99)
Potassium: 3.8 mmol/L (ref 3.5–5.2)
Sodium: 143 mmol/L (ref 134–144)
Total Protein: 7.2 g/dL (ref 6.0–8.5)
eGFR: 35 mL/min/1.73 — ABNORMAL LOW

## 2024-12-04 LAB — LIPID PANEL
Chol/HDL Ratio: 1.9 ratio (ref 0.0–4.4)
Cholesterol, Total: 129 mg/dL (ref 100–199)
HDL: 67 mg/dL
LDL Chol Calc (NIH): 48 mg/dL (ref 0–99)
Triglycerides: 68 mg/dL (ref 0–149)
VLDL Cholesterol Cal: 14 mg/dL (ref 5–40)

## 2024-12-04 LAB — CBC
Hematocrit: 38.8 % (ref 34.0–46.6)
Hemoglobin: 12.5 g/dL (ref 11.1–15.9)
MCH: 30 pg (ref 26.6–33.0)
MCHC: 32.2 g/dL (ref 31.5–35.7)
MCV: 93 fL (ref 79–97)
Platelets: 253 x10E3/uL (ref 150–450)
RBC: 4.17 x10E6/uL (ref 3.77–5.28)
RDW: 13.6 % (ref 11.7–15.4)
WBC: 5.7 x10E3/uL (ref 3.4–10.8)

## 2024-12-04 LAB — HEMOGLOBIN A1C
Est. average glucose Bld gHb Est-mCnc: 157 mg/dL
Hgb A1c MFr Bld: 7.1 % — ABNORMAL HIGH (ref 4.8–5.6)

## 2024-12-11 ENCOUNTER — Ambulatory Visit: Payer: Self-pay | Admitting: Internal Medicine

## 2024-12-12 ENCOUNTER — Other Ambulatory Visit: Payer: Self-pay | Admitting: Internal Medicine

## 2025-03-24 ENCOUNTER — Encounter: Payer: Self-pay | Admitting: Internal Medicine
